# Patient Record
Sex: Male | Born: 1955 | Race: White | Hispanic: No | Marital: Single | Smoking: Former smoker
Health system: Southern US, Community
[De-identification: ages and names within clinical notes are randomized; demographics above are authoritative.]

## PROBLEM LIST (undated history)

## (undated) DIAGNOSIS — G959 Disease of spinal cord, unspecified: Secondary | ICD-10-CM

## (undated) DIAGNOSIS — G8220 Paraplegia, unspecified: Secondary | ICD-10-CM

## (undated) DIAGNOSIS — R269 Unspecified abnormalities of gait and mobility: Secondary | ICD-10-CM

## (undated) DIAGNOSIS — R471 Dysarthria and anarthria: Secondary | ICD-10-CM

## (undated) DIAGNOSIS — G249 Dystonia, unspecified: Secondary | ICD-10-CM

## (undated) HISTORY — DX: Dysarthria and anarthria: R47.1

## (undated) HISTORY — DX: Disease of spinal cord, unspecified: G95.9

## (undated) HISTORY — DX: Paraplegia, unspecified: G82.20

## (undated) HISTORY — DX: Unspecified abnormalities of gait and mobility: R26.9

## (undated) HISTORY — DX: Dystonia, unspecified: G24.9

---

## 2009-11-05 DIAGNOSIS — G249 Dystonia, unspecified: Secondary | ICD-10-CM | POA: Diagnosis present

## 2010-10-27 DIAGNOSIS — R471 Dysarthria and anarthria: Secondary | ICD-10-CM | POA: Insufficient documentation

## 2012-06-15 DIAGNOSIS — G243 Spasmodic torticollis: Secondary | ICD-10-CM | POA: Diagnosis not present

## 2012-07-27 DIAGNOSIS — R259 Unspecified abnormal involuntary movements: Secondary | ICD-10-CM | POA: Diagnosis not present

## 2012-07-27 DIAGNOSIS — M4712 Other spondylosis with myelopathy, cervical region: Secondary | ICD-10-CM | POA: Diagnosis not present

## 2012-07-27 DIAGNOSIS — G589 Mononeuropathy, unspecified: Secondary | ICD-10-CM | POA: Diagnosis not present

## 2012-09-01 DIAGNOSIS — M4712 Other spondylosis with myelopathy, cervical region: Secondary | ICD-10-CM | POA: Diagnosis not present

## 2012-09-01 DIAGNOSIS — R209 Unspecified disturbances of skin sensation: Secondary | ICD-10-CM | POA: Diagnosis not present

## 2012-09-08 DIAGNOSIS — M4802 Spinal stenosis, cervical region: Secondary | ICD-10-CM | POA: Diagnosis not present

## 2012-09-08 DIAGNOSIS — M4712 Other spondylosis with myelopathy, cervical region: Secondary | ICD-10-CM | POA: Diagnosis not present

## 2012-09-15 DIAGNOSIS — G243 Spasmodic torticollis: Secondary | ICD-10-CM | POA: Diagnosis not present

## 2012-09-29 DIAGNOSIS — Z01810 Encounter for preprocedural cardiovascular examination: Secondary | ICD-10-CM | POA: Diagnosis not present

## 2012-09-29 DIAGNOSIS — M4712 Other spondylosis with myelopathy, cervical region: Secondary | ICD-10-CM | POA: Diagnosis not present

## 2012-10-04 DIAGNOSIS — R259 Unspecified abnormal involuntary movements: Secondary | ICD-10-CM | POA: Diagnosis not present

## 2012-10-04 DIAGNOSIS — Z981 Arthrodesis status: Secondary | ICD-10-CM | POA: Diagnosis not present

## 2012-10-04 DIAGNOSIS — M5000 Cervical disc disorder with myelopathy, unspecified cervical region: Secondary | ICD-10-CM | POA: Diagnosis not present

## 2012-10-04 DIAGNOSIS — Z87891 Personal history of nicotine dependence: Secondary | ICD-10-CM | POA: Diagnosis not present

## 2012-10-04 DIAGNOSIS — Z79899 Other long term (current) drug therapy: Secondary | ICD-10-CM | POA: Diagnosis not present

## 2012-10-04 DIAGNOSIS — M4712 Other spondylosis with myelopathy, cervical region: Secondary | ICD-10-CM | POA: Diagnosis not present

## 2012-10-04 DIAGNOSIS — R498 Other voice and resonance disorders: Secondary | ICD-10-CM | POA: Diagnosis not present

## 2012-10-04 DIAGNOSIS — R1310 Dysphagia, unspecified: Secondary | ICD-10-CM | POA: Diagnosis not present

## 2012-10-11 DIAGNOSIS — R413 Other amnesia: Secondary | ICD-10-CM | POA: Diagnosis not present

## 2012-10-11 DIAGNOSIS — Z981 Arthrodesis status: Secondary | ICD-10-CM | POA: Diagnosis not present

## 2012-10-11 DIAGNOSIS — R0989 Other specified symptoms and signs involving the circulatory and respiratory systems: Secondary | ICD-10-CM | POA: Diagnosis not present

## 2012-10-11 DIAGNOSIS — Z5189 Encounter for other specified aftercare: Secondary | ICD-10-CM | POA: Diagnosis not present

## 2012-10-11 DIAGNOSIS — H9190 Unspecified hearing loss, unspecified ear: Secondary | ICD-10-CM | POA: Diagnosis not present

## 2012-10-11 DIAGNOSIS — Z4789 Encounter for other orthopedic aftercare: Secondary | ICD-10-CM | POA: Diagnosis not present

## 2012-10-11 DIAGNOSIS — R6889 Other general symptoms and signs: Secondary | ICD-10-CM | POA: Diagnosis not present

## 2012-10-11 DIAGNOSIS — M6281 Muscle weakness (generalized): Secondary | ICD-10-CM | POA: Diagnosis not present

## 2012-10-11 DIAGNOSIS — R633 Feeding difficulties: Secondary | ICD-10-CM | POA: Diagnosis not present

## 2012-10-11 DIAGNOSIS — R259 Unspecified abnormal involuntary movements: Secondary | ICD-10-CM | POA: Diagnosis not present

## 2012-10-11 DIAGNOSIS — M4712 Other spondylosis with myelopathy, cervical region: Secondary | ICD-10-CM | POA: Diagnosis not present

## 2012-10-11 DIAGNOSIS — N882 Stricture and stenosis of cervix uteri: Secondary | ICD-10-CM | POA: Diagnosis not present

## 2012-10-11 DIAGNOSIS — Z9889 Other specified postprocedural states: Secondary | ICD-10-CM | POA: Diagnosis not present

## 2012-10-11 DIAGNOSIS — G959 Disease of spinal cord, unspecified: Secondary | ICD-10-CM | POA: Diagnosis not present

## 2012-10-11 DIAGNOSIS — Z87891 Personal history of nicotine dependence: Secondary | ICD-10-CM | POA: Diagnosis not present

## 2012-10-11 DIAGNOSIS — R4587 Impulsiveness: Secondary | ICD-10-CM | POA: Diagnosis not present

## 2012-10-11 DIAGNOSIS — R279 Unspecified lack of coordination: Secondary | ICD-10-CM | POA: Diagnosis not present

## 2012-10-11 DIAGNOSIS — R4789 Other speech disturbances: Secondary | ICD-10-CM | POA: Diagnosis not present

## 2012-10-11 DIAGNOSIS — M62838 Other muscle spasm: Secondary | ICD-10-CM | POA: Diagnosis not present

## 2012-10-11 DIAGNOSIS — F411 Generalized anxiety disorder: Secondary | ICD-10-CM | POA: Diagnosis not present

## 2012-10-11 DIAGNOSIS — R269 Unspecified abnormalities of gait and mobility: Secondary | ICD-10-CM | POA: Diagnosis not present

## 2012-10-11 DIAGNOSIS — R5381 Other malaise: Secondary | ICD-10-CM | POA: Diagnosis not present

## 2012-10-11 DIAGNOSIS — R1312 Dysphagia, oropharyngeal phase: Secondary | ICD-10-CM | POA: Diagnosis not present

## 2012-10-11 DIAGNOSIS — R1310 Dysphagia, unspecified: Secondary | ICD-10-CM | POA: Diagnosis not present

## 2012-10-11 DIAGNOSIS — R1313 Dysphagia, pharyngeal phase: Secondary | ICD-10-CM | POA: Diagnosis not present

## 2012-10-11 DIAGNOSIS — R471 Dysarthria and anarthria: Secondary | ICD-10-CM | POA: Diagnosis not present

## 2012-10-26 DIAGNOSIS — M5030 Other cervical disc degeneration, unspecified cervical region: Secondary | ICD-10-CM | POA: Diagnosis not present

## 2012-10-26 DIAGNOSIS — R1312 Dysphagia, oropharyngeal phase: Secondary | ICD-10-CM | POA: Diagnosis not present

## 2012-10-26 DIAGNOSIS — R471 Dysarthria and anarthria: Secondary | ICD-10-CM | POA: Diagnosis not present

## 2012-10-26 DIAGNOSIS — G241 Genetic torsion dystonia: Secondary | ICD-10-CM | POA: Diagnosis not present

## 2012-10-26 DIAGNOSIS — N882 Stricture and stenosis of cervix uteri: Secondary | ICD-10-CM | POA: Diagnosis not present

## 2012-10-26 DIAGNOSIS — R4789 Other speech disturbances: Secondary | ICD-10-CM | POA: Diagnosis not present

## 2012-10-26 DIAGNOSIS — M6281 Muscle weakness (generalized): Secondary | ICD-10-CM | POA: Diagnosis not present

## 2012-10-26 DIAGNOSIS — Z7901 Long term (current) use of anticoagulants: Secondary | ICD-10-CM | POA: Diagnosis not present

## 2012-10-26 DIAGNOSIS — Z4789 Encounter for other orthopedic aftercare: Secondary | ICD-10-CM | POA: Diagnosis not present

## 2012-10-26 DIAGNOSIS — M4712 Other spondylosis with myelopathy, cervical region: Secondary | ICD-10-CM | POA: Diagnosis not present

## 2012-10-26 DIAGNOSIS — Z981 Arthrodesis status: Secondary | ICD-10-CM | POA: Diagnosis not present

## 2012-10-26 DIAGNOSIS — R269 Unspecified abnormalities of gait and mobility: Secondary | ICD-10-CM | POA: Diagnosis not present

## 2012-10-26 DIAGNOSIS — Z5189 Encounter for other specified aftercare: Secondary | ICD-10-CM | POA: Diagnosis not present

## 2012-10-26 DIAGNOSIS — R1313 Dysphagia, pharyngeal phase: Secondary | ICD-10-CM | POA: Diagnosis not present

## 2012-11-02 DIAGNOSIS — M5030 Other cervical disc degeneration, unspecified cervical region: Secondary | ICD-10-CM | POA: Diagnosis not present

## 2012-11-02 DIAGNOSIS — G241 Genetic torsion dystonia: Secondary | ICD-10-CM | POA: Diagnosis not present

## 2012-11-04 DIAGNOSIS — M5030 Other cervical disc degeneration, unspecified cervical region: Secondary | ICD-10-CM | POA: Diagnosis not present

## 2012-11-04 DIAGNOSIS — G241 Genetic torsion dystonia: Secondary | ICD-10-CM | POA: Diagnosis not present

## 2012-11-05 DIAGNOSIS — Z4789 Encounter for other orthopedic aftercare: Secondary | ICD-10-CM | POA: Diagnosis not present

## 2012-11-05 DIAGNOSIS — Z981 Arthrodesis status: Secondary | ICD-10-CM | POA: Diagnosis not present

## 2012-11-19 DIAGNOSIS — R209 Unspecified disturbances of skin sensation: Secondary | ICD-10-CM | POA: Diagnosis not present

## 2012-11-19 DIAGNOSIS — M4712 Other spondylosis with myelopathy, cervical region: Secondary | ICD-10-CM | POA: Diagnosis not present

## 2012-11-19 DIAGNOSIS — R4789 Other speech disturbances: Secondary | ICD-10-CM | POA: Diagnosis not present

## 2012-11-19 DIAGNOSIS — M6281 Muscle weakness (generalized): Secondary | ICD-10-CM | POA: Diagnosis not present

## 2012-11-19 DIAGNOSIS — R471 Dysarthria and anarthria: Secondary | ICD-10-CM | POA: Diagnosis not present

## 2012-11-19 DIAGNOSIS — R269 Unspecified abnormalities of gait and mobility: Secondary | ICD-10-CM | POA: Diagnosis not present

## 2012-11-19 DIAGNOSIS — R1313 Dysphagia, pharyngeal phase: Secondary | ICD-10-CM | POA: Diagnosis not present

## 2012-11-19 DIAGNOSIS — Z5189 Encounter for other specified aftercare: Secondary | ICD-10-CM | POA: Diagnosis not present

## 2012-11-29 DIAGNOSIS — R471 Dysarthria and anarthria: Secondary | ICD-10-CM | POA: Diagnosis not present

## 2012-11-29 DIAGNOSIS — M4712 Other spondylosis with myelopathy, cervical region: Secondary | ICD-10-CM | POA: Diagnosis not present

## 2012-11-29 DIAGNOSIS — R4789 Other speech disturbances: Secondary | ICD-10-CM | POA: Diagnosis not present

## 2012-11-29 DIAGNOSIS — R269 Unspecified abnormalities of gait and mobility: Secondary | ICD-10-CM | POA: Diagnosis not present

## 2012-11-29 DIAGNOSIS — Z5189 Encounter for other specified aftercare: Secondary | ICD-10-CM | POA: Diagnosis not present

## 2012-11-29 DIAGNOSIS — R209 Unspecified disturbances of skin sensation: Secondary | ICD-10-CM | POA: Diagnosis not present

## 2012-11-29 DIAGNOSIS — R1313 Dysphagia, pharyngeal phase: Secondary | ICD-10-CM | POA: Diagnosis not present

## 2012-11-29 DIAGNOSIS — M6281 Muscle weakness (generalized): Secondary | ICD-10-CM | POA: Diagnosis not present

## 2012-12-01 DIAGNOSIS — M4712 Other spondylosis with myelopathy, cervical region: Secondary | ICD-10-CM | POA: Diagnosis not present

## 2012-12-01 DIAGNOSIS — Z981 Arthrodesis status: Secondary | ICD-10-CM | POA: Diagnosis not present

## 2012-12-22 DIAGNOSIS — G8220 Paraplegia, unspecified: Secondary | ICD-10-CM | POA: Diagnosis not present

## 2012-12-22 DIAGNOSIS — G243 Spasmodic torticollis: Secondary | ICD-10-CM | POA: Diagnosis not present

## 2012-12-28 DIAGNOSIS — IMO0001 Reserved for inherently not codable concepts without codable children: Secondary | ICD-10-CM | POA: Diagnosis not present

## 2012-12-28 DIAGNOSIS — R4789 Other speech disturbances: Secondary | ICD-10-CM | POA: Diagnosis not present

## 2012-12-28 DIAGNOSIS — R1313 Dysphagia, pharyngeal phase: Secondary | ICD-10-CM | POA: Diagnosis not present

## 2013-01-05 DIAGNOSIS — Z981 Arthrodesis status: Secondary | ICD-10-CM | POA: Diagnosis not present

## 2013-01-05 DIAGNOSIS — M4712 Other spondylosis with myelopathy, cervical region: Secondary | ICD-10-CM | POA: Diagnosis not present

## 2013-01-06 DIAGNOSIS — R4789 Other speech disturbances: Secondary | ICD-10-CM | POA: Diagnosis not present

## 2013-01-06 DIAGNOSIS — IMO0001 Reserved for inherently not codable concepts without codable children: Secondary | ICD-10-CM | POA: Diagnosis not present

## 2013-01-06 DIAGNOSIS — R1313 Dysphagia, pharyngeal phase: Secondary | ICD-10-CM | POA: Diagnosis not present

## 2013-01-07 DIAGNOSIS — Z1211 Encounter for screening for malignant neoplasm of colon: Secondary | ICD-10-CM | POA: Diagnosis not present

## 2013-01-07 DIAGNOSIS — Z125 Encounter for screening for malignant neoplasm of prostate: Secondary | ICD-10-CM | POA: Diagnosis not present

## 2013-01-07 DIAGNOSIS — Z0000 Encounter for general adult medical examination without abnormal findings: Secondary | ICD-10-CM | POA: Diagnosis not present

## 2013-01-12 DIAGNOSIS — E7800 Pure hypercholesterolemia, unspecified: Secondary | ICD-10-CM | POA: Diagnosis not present

## 2013-01-13 DIAGNOSIS — R1313 Dysphagia, pharyngeal phase: Secondary | ICD-10-CM | POA: Diagnosis not present

## 2013-01-13 DIAGNOSIS — R4789 Other speech disturbances: Secondary | ICD-10-CM | POA: Diagnosis not present

## 2013-01-13 DIAGNOSIS — IMO0001 Reserved for inherently not codable concepts without codable children: Secondary | ICD-10-CM | POA: Diagnosis not present

## 2013-01-18 DIAGNOSIS — R4789 Other speech disturbances: Secondary | ICD-10-CM | POA: Diagnosis not present

## 2013-01-18 DIAGNOSIS — IMO0001 Reserved for inherently not codable concepts without codable children: Secondary | ICD-10-CM | POA: Diagnosis not present

## 2013-01-18 DIAGNOSIS — R1313 Dysphagia, pharyngeal phase: Secondary | ICD-10-CM | POA: Diagnosis not present

## 2013-03-22 ENCOUNTER — Encounter: Payer: Self-pay | Admitting: Neurology

## 2013-03-22 ENCOUNTER — Ambulatory Visit (INDEPENDENT_AMBULATORY_CARE_PROVIDER_SITE_OTHER): Payer: Medicare Other | Admitting: Neurology

## 2013-03-22 VITALS — Wt 147.0 lb

## 2013-03-22 DIAGNOSIS — G243 Spasmodic torticollis: Secondary | ICD-10-CM

## 2013-03-22 DIAGNOSIS — G249 Dystonia, unspecified: Secondary | ICD-10-CM

## 2013-03-22 DIAGNOSIS — G8220 Paraplegia, unspecified: Secondary | ICD-10-CM

## 2013-03-22 DIAGNOSIS — R471 Dysarthria and anarthria: Secondary | ICD-10-CM

## 2013-03-22 DIAGNOSIS — G959 Disease of spinal cord, unspecified: Secondary | ICD-10-CM

## 2013-03-22 DIAGNOSIS — R269 Unspecified abnormalities of gait and mobility: Secondary | ICD-10-CM | POA: Insufficient documentation

## 2013-03-22 MED ORDER — TRAMADOL HCL 50 MG PO TBDP: 50.0000 mg | ORAL_TABLET | Freq: Three times a day (TID) | ORAL | Status: DC | PRN

## 2013-03-22 MED ORDER — ONABOTULINUMTOXINA 100 UNITS IJ SOLR: 300.0000 [IU] | Freq: Once | INTRAMUSCULAR | Status: DC

## 2013-03-22 NOTE — Progress Notes (Signed)
History of Present Illness   Patrick Daniel is a 57 year old right-handed Caucasian male.  He has past medical history of hyperlipidemia, depression, presenting with 3 years history of gradual onset language difficulty, gait difficulty.  He was evaluated by [name] neurologist, reported normal MRI of brain, he was diagnosed with cervical dystonia, was given Botox injection of the cervical muscles, which has improved his symptoms temporarily.  He moved back to the [place] in April 2012, began to seek medical attention, initially evaluated by Dr. [name], reported again normal MRI of brain, also had trial of Haldol and baclofen without much improvement.   Sinemet did not help him. Artane and baclofen provide no help either. 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  He was evaluated by local neurosurgeon Dr. [name] at [facility], Because concomitant central nervous system degenerative disorder, cervical dystonia, he is hesitate to perform decompression surgery. I later referred him to [facility] neurology, evaluated by Dr. [name], who has suggested Botox injection to alleviate his cervical dystonia symptoms first, may consider cervical decompression surgery later. Also, likely there is a component of central nervous system degenerative disorder, such as spinocerebellar ataxia,.   He has profound dysarthria, also dysphagia, gait difficulty, frequent falling, he denies sensory changes.  Normal or negative CMP, PEP, CBC, HIV, Lyme disease, SSA/B, TSH, ACE, RPR, CRP, CPK.  He received first EMG guided Botox injection since March 2012, he reported  great improvement, he has been receiving BOTOX injection every 3 months, there is significant improvement of his abnormal neck  postural, but still with severe retrocollis, left turn, right tilt.  he began to have moderate improvement after repeat injection.  he was evaluated by his neurosurgeon Dr. [name] at [facility], repeat MRI cervical showed advanced multilevel degenerative changes of the cervical spine, most pronounced at C5-C6, where there is severe central stenosis, impression on the cord with focal myelomalacia and severe bilateral neuroforaminal narrowing. Overall appearance is similar to prior study from 09/18/2010. He had posterior decompression  and fusion surgery in December 2013, recovering well, but complains of neck muscle spasm, and chest muscle spasm continued spastic gait difficulty, occasional urinary incontinence, his posture is much improved, but has limited range of motion of his neck  UPDATE May 27th 2014: Last injection was in Feb 26th 2014. He responded very well, he has moved into independent living since April 2014, he walks couple miles every other day, using his cane, his gait difficulty has stabilized, his talking, and the swallowing, abnormal neck posture has stabilized as well, he complains of neck pain, left shoulder achy pain,.     Physical Exam  Neck: s/p posterior cervical fusion  Neurologic Exam  Mental Status: Patient had much improved, cervical dystonia, with severe limited range of motion of his neck, mild retrocollis,  mild to moderate rotatation to left side, mild right shoulder elevation.  slow spastic speech. Cranial Nerves: CN II-XII pupils were equal round reactive to light.  Extraocular movements were full.   facial symmetric,uvula tongue midline, there was no tongue atrophy and fasciculations, he has slow spastic tongue movement,    Motor:  no significant weakness, mild to moderate spasticity of bilateral LEs Sensory: preserved bilateral ankle vibratory sensation,  mildly length-dependent decreased pinprick to above ankle level Gait and Station:  wide-based, stiff, unsteady  gait Reflexes:  hyperreflexia, UEs 2+, patellar 3+/3+, plantar responses were extensor bilaterally   Assessment and Plan: 57 year-old gentleman with gradual onset, but progressive worsening spastic dysarthria, profound cervical dystonia, spastic paraplegia, gait difficulty,MRI showed advanced multilevel degenerative changes of the cervical spine, most pronounced at C5 and 6, there was severe central canal stenosis, iimpression on cervical cord,, with focal myelomalacia, severe bilateral foraminal narrowing. he had a posterior decompression, and fusion  by neurosurgeon at [facility], return for repeat Botox injection  400 units of BOTOX A were injected, (Lot No. [id number], Exp Oct 2016), 100 units were dissolved into 2 cc of NS.  Right  sternocleidomastoid  50 units   left splenius capitis 25x2=50 Left splenis cervix 25x 3=75 Left  left iliocostalis 50 units Left upper trapezius 50 Left longissimus 25  Patient tolerated the injection very well, will return to clinic in 3 months for repeat injection refill tramadol 50 mg, 60 tablets, 3 refills.

## 2013-04-08 DIAGNOSIS — Z87891 Personal history of nicotine dependence: Secondary | ICD-10-CM | POA: Diagnosis not present

## 2013-04-08 DIAGNOSIS — Z09 Encounter for follow-up examination after completed treatment for conditions other than malignant neoplasm: Secondary | ICD-10-CM | POA: Diagnosis not present

## 2013-04-08 DIAGNOSIS — Z4789 Encounter for other orthopedic aftercare: Secondary | ICD-10-CM | POA: Diagnosis not present

## 2013-04-08 DIAGNOSIS — Z981 Arthrodesis status: Secondary | ICD-10-CM | POA: Diagnosis not present

## 2013-05-09 DIAGNOSIS — Z131 Encounter for screening for diabetes mellitus: Secondary | ICD-10-CM | POA: Diagnosis not present

## 2013-05-09 DIAGNOSIS — M542 Cervicalgia: Secondary | ICD-10-CM | POA: Diagnosis not present

## 2013-05-09 DIAGNOSIS — E782 Mixed hyperlipidemia: Secondary | ICD-10-CM | POA: Diagnosis not present

## 2013-06-29 ENCOUNTER — Encounter: Payer: Self-pay | Admitting: Neurology

## 2013-06-29 ENCOUNTER — Ambulatory Visit (INDEPENDENT_AMBULATORY_CARE_PROVIDER_SITE_OTHER): Payer: Medicare Other | Admitting: Neurology

## 2013-06-29 VITALS — BP 123/65 | HR 78 | Ht 65.0 in | Wt 156.5 lb

## 2013-06-29 DIAGNOSIS — G249 Dystonia, unspecified: Secondary | ICD-10-CM

## 2013-06-29 DIAGNOSIS — G243 Spasmodic torticollis: Secondary | ICD-10-CM

## 2013-06-29 DIAGNOSIS — R269 Unspecified abnormalities of gait and mobility: Secondary | ICD-10-CM

## 2013-06-29 DIAGNOSIS — G8220 Paraplegia, unspecified: Secondary | ICD-10-CM

## 2013-06-29 DIAGNOSIS — G959 Disease of spinal cord, unspecified: Secondary | ICD-10-CM

## 2013-06-29 DIAGNOSIS — R471 Dysarthria and anarthria: Secondary | ICD-10-CM

## 2013-06-29 MED ORDER — ONABOTULINUMTOXINA 100 UNITS IJ SOLR: 400.0000 [IU] | Freq: Once | INTRAMUSCULAR | Status: DC

## 2013-06-29 MED ORDER — ONABOTULINUMTOXINA 100 UNITS IJ SOLR: 100.0000 [IU] | Freq: Once | INTRAMUSCULAR | Status: DC

## 2013-06-29 MED ORDER — ONABOTULINUMTOXINA 100 UNITS IJ SOLR
100.0000 [IU] | Freq: Once | INTRAMUSCULAR | Status: AC
  Administered 2013-06-29: 100 [IU] via INTRAMUSCULAR

## 2013-06-29 MED ORDER — ONABOTULINUMTOXINA 100 UNITS IJ SOLR
300.0000 [IU] | Freq: Once | INTRAMUSCULAR | Status: AC
  Administered 2013-06-29: 300 [IU] via INTRAMUSCULAR

## 2013-06-29 NOTE — Addendum Note (Signed)
Addended by: Levert Feinstein on: 06/29/2013 03:11 PM   Modules accepted: Orders

## 2013-06-29 NOTE — Progress Notes (Signed)
History of Present Illness   Quanta is a 57 year old right-handed Caucasian male.  He has past medical history of hyperlipidemia, depression, presenting with 3 years history of gradual onset language difficulty, gait difficulty.  He was evaluated by [facility] neurologist, reported normal MRI of brain, he was diagnosed with cervical dystonia, was given Botox injection of the cervical muscles, which has improved his symptoms temporarily.  He moved back to the [place] in April 2012, began to seek medical attention, initially evaluated by Dr. [name], reported again normal MRI of brain, also had trial of Haldol and baclofen without much improvement.   Sinemet did not help him. Artane and baclofen provide no help either. 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  He was evaluated by local neurosurgeon Dr. [name] at [facility], Because concomitant central nervous system degenerative disorder, cervical dystonia, he is hesitate to perform decompression surgery. I later referred him to [facility] neurology, evaluated by Dr. [name], who has suggested Botox injection to alleviate his cervical dystonia symptoms first, may consider cervical decompression surgery later. Also, likely there is a component of central nervous system degenerative disorder, such as spinocerebellar ataxia,.   He has profound dysarthria, also dysphagia, gait difficulty, frequent falling, he denies sensory changes.  Normal or negative CMP, PEP, CBC, HIV, Lyme disease, SSA/B, TSH, ACE, RPR, CRP, CPK.  He received first EMG guided Botox injection since March 2012, he reported  great improvement, he has been receiving BOTOX injection every 3 months, there is significant improvement of his abnormal neck  postural, but still with severe retrocollis, left turn, right tilt.  he began to have moderate improvement after repeat injection.  he was evaluated by his neurosurgeon Dr. [name] at [facility], repeat MRI cervical showed advanced multilevel degenerative changes of the cervical spine, most pronounced at C5-C6, where there is severe central stenosis, impression on the cord with focal myelomalacia and severe bilateral neuroforaminal narrowing. Overall appearance is similar to prior study from 09/18/2010. He had posterior decompression  and fusion surgery in December 2013, recovering well, but complains of neck muscle spasm, and chest muscle spasm continued spastic gait difficulty, occasional urinary incontinence, his posture is much improved, but has limited range of motion of his neck  UPDATE Sep 3rd 2014: Last injection was in May 27th 2014. He responded very well, he has moved into independent living since April 2014, he walks couple miles every other day, using his cane, his gait difficulty has stabilized, his talking, and the swallowing, abnormal neck posture has stabilized as well, he complains of dizziness when leaning backwards.    Physical Exam  Neck: s/p posterior cervical fusion  Neurologic Exam  Mental Status: Patient had much improved, cervical dystonia, with severe limited range of motion of his neck, mild retrocollis,  mild to moderate rotatation to left side, mild right shoulder elevation.  slow spastic speech. Cranial Nerves: CN II-XII pupils were equal round reactive to light.  Extraocular movements were full.   facial symmetric,uvula tongue midline, there was no tongue atrophy and fasciculations, he has slow spastic tongue movement,    Motor:  no significant weakness, mild to moderate spasticity of bilateral lower extremities Sensory: preserved bilateral ankle vibratory sensation,  mildly length-dependent decreased pinprick to above ankle level Gait and Station:  wide-based, stiff,  unsteady gait Reflexes:  hyperreflexia, UEs 2+, patellar 3+/3+, plantar responses were extensor bilaterally   Assessment and Plan: 57 year-old gentleman with gradual onset, but progressive worsening spastic dysarthria, profound cervical dystonia, spastic paraplegia, gait difficulty,MRI showed advanced multilevel degenerative changes of the cervical spine, most pronounced at C5 and 6, there was severe central canal stenosis,  impression on cervical cord,, with focal myelomalacia, severe bilateral foraminal narrowing. he had a posterior decompression, and fusion  by neurosurgeon Dr. [name] in Dec 2013 at [facility], return for repeat Botox injection  400 units of BOTOX A were injected, (Lot No. [id number], Exp Feb 2017), 100 units were dissolved into 2 cc of NS.  Right  sternocleidomastoid  50 units   left splenius capitis 50x2=100 Left splenis cervix 25x 5=125 Left  left iliocostalis 50 units Left longissimus 25x2=50 units Left levator scapula 25 units Left semisplenius 25 units  Right splenius capitis 25 units  Patient tolerated the injection very well, will return to clinic in 3 months for repeat injection, will decrease his botox dose to 300 units on next injection

## 2013-09-01 ENCOUNTER — Other Ambulatory Visit: Payer: Self-pay

## 2013-10-04 HISTORY — PX: POSTERIOR CERVICAL FUSION/FORAMINOTOMY: SHX5038

## 2013-10-04 HISTORY — PX: ANTERIOR CERVICAL DECOMP/DISCECTOMY FUSION: SHX1161

## 2013-10-05 ENCOUNTER — Ambulatory Visit (INDEPENDENT_AMBULATORY_CARE_PROVIDER_SITE_OTHER): Payer: Medicare Other | Admitting: Neurology

## 2013-10-05 ENCOUNTER — Encounter: Payer: Self-pay | Admitting: Neurology

## 2013-10-05 VITALS — BP 128/80 | HR 85 | Ht 66.0 in | Wt 168.0 lb

## 2013-10-05 DIAGNOSIS — G8220 Paraplegia, unspecified: Secondary | ICD-10-CM

## 2013-10-05 DIAGNOSIS — G243 Spasmodic torticollis: Secondary | ICD-10-CM

## 2013-10-05 DIAGNOSIS — G249 Dystonia, unspecified: Secondary | ICD-10-CM

## 2013-10-05 MED ORDER — ONABOTULINUMTOXINA 100 UNITS IJ SOLR
400.0000 [IU] | Freq: Once | INTRAMUSCULAR | Status: AC
  Administered 2013-10-05: 400 [IU] via INTRAMUSCULAR

## 2013-10-05 NOTE — Progress Notes (Signed)
History of Present Illness   Patrick Daniel is a 57 year old right-handed Caucasian male.  He has past medical history of hyperlipidemia, depression, presenting with 3 years history of gradual onset language difficulty, gait difficulty.  He was evaluated by [facility] neurologist, reported normal MRI of brain, he was diagnosed with cervical dystonia, was given Botox injection of the cervical muscles, which has improved his symptoms temporarily.  He moved back to the [name] in April 2012, began to seek medical attention, initially evaluated by [name], reported again normal MRI of brain, also had trial of Haldol and baclofen without much improvement.   Sinemet did not help him. Artane and baclofen provide no help either. 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  He was evaluated by local neurosurgeon [name] at [facility], Because concomitant central nervous system degenerative disorder, cervical dystonia, he is hesitate to perform decompression surgery. I later referred him to [facility] neurology, evaluated by [name], who has suggested Botox injection to alleviate his cervical dystonia symptoms first, may consider cervical decompression surgery later. Also, likely there is a component of central nervous system degenerative disorder, such as spinocerebellar ataxia,.   He has profound dysarthria, also dysphagia, gait difficulty, frequent falling, he denies sensory changes.  Normal or negative CMP, PEP, CBC, HIV, Lyme disease, SSA/B, TSH, ACE, RPR, CRP, CPK.  He received first EMG guided Botox injection since March 2012, he reported  great improvement, he has been receiving BOTOX injection every 3 months, there is significant improvement of his abnormal neck  postural, but still with severe retrocollis, left turn, right tilt.  he began to have moderate improvement after repeat injection.  he was evaluated by his neurosurgeon [name] at [facility], repeat MRI cervical showed advanced multilevel degenerative changes of the cervical spine, most pronounced at C5-C6, where there is severe central stenosis, impression on the cord with focal myelomalacia and severe bilateral neuroforaminal narrowing. Overall appearance is similar to prior study from 09/18/2010. He had posterior decompression  and fusion surgery in December 2013, recovering well, but complains of neck muscle spasm, and chest muscle spasm continued spastic gait difficulty, occasional urinary incontinence, his posture is much improved, but has limited range of motion of his neck  UPDATE 10/05/2013 Last injection was in  Sep 2014. He responded very well, he has moved into independent living since April 2014, he walks couple miles every other day, using his cane, his gait difficulty has stabilized, his talking, and the swallowing, abnormal neck posture has stabilized as well,      Physical Exam  Neck: s/p posterior cervical fusion  Neurologic Exam  Mental Status: Patient had much improved, cervical dystonia, with severe limited range of motion of his neck, slight retrocollis,  mild to moderate rotatation to left side, slight right shoulder elevation.  slow spastic speech. Cranial Nerves: CN II-XII pupils were equal round reactive to light.  Extraocular movements were full.   facial symmetric,uvula tongue midline, there was no tongue atrophy and fasciculations, he has slow spastic tongue movement,    Motor:  no significant weakness, mild to moderate spasticity of bilateral lower extremities Sensory: preserved bilateral ankle vibratory sensation,  mildly length-dependent decreased pinprick to above ankle level Gait and Station:  wide-based, stiff, unsteady gait Reflexes:  hyperreflexia, UEs 2+,  patellar 3+/3+, plantar  responses were extensor bilaterally   Assessment and Plan:   57 year-old gentleman with gradual onset, but progressive worsening spastic dysarthria, profound cervical dystonia, spastic paraplegia, gait difficulty,MRI showed advanced multilevel degenerative changes of the cervical spine, most pronounced at C5 and 6, there was severe central canal stenosis, compressing on cervical cord,, with focal myelomalacia, severe bilateral foraminal narrowing. he had a posterior decompression, and fusion  by neurosurgeon Dr. [name] in Dec 2013 at [facility], return for repeat Botox injection. His severe cervical dystonia has much improved with treatment.  400 units of BOTOX A were injected, (Lot No. [id number], Feb 2017, [id number], Exp April 2017), 100 units were dissolved into 2 cc of NS.  Right  sternocleidomastoid 75 units   left splenius capitis 25x2=50 Left splenis cervix 25 x2=50  Left  left iliocostalis 50 units Left longissimus 25x2=50 units  Left levator scapula 25 units Left semisplenius 25 units Left oblique capitus inferior 25 units  Right levator scapula 25 units Right oblique capitus inferior 25 units  Patient tolerated the injection very well, will return to clinic in 3 months for repeat injection, will decrease his botox dose to 300 units on next injection

## 2013-10-12 DIAGNOSIS — Z981 Arthrodesis status: Secondary | ICD-10-CM | POA: Diagnosis not present

## 2013-10-12 DIAGNOSIS — Z4789 Encounter for other orthopedic aftercare: Secondary | ICD-10-CM | POA: Diagnosis not present

## 2013-10-12 DIAGNOSIS — R259 Unspecified abnormal involuntary movements: Secondary | ICD-10-CM | POA: Diagnosis not present

## 2013-10-12 DIAGNOSIS — Z9889 Other specified postprocedural states: Secondary | ICD-10-CM | POA: Diagnosis not present

## 2013-10-12 DIAGNOSIS — R209 Unspecified disturbances of skin sensation: Secondary | ICD-10-CM | POA: Diagnosis not present

## 2014-01-04 ENCOUNTER — Encounter: Payer: Self-pay | Admitting: Neurology

## 2014-01-04 ENCOUNTER — Ambulatory Visit (INDEPENDENT_AMBULATORY_CARE_PROVIDER_SITE_OTHER): Payer: Medicare Other | Admitting: Neurology

## 2014-01-04 VITALS — Ht 66.0 in | Wt 164.0 lb

## 2014-01-04 DIAGNOSIS — R269 Unspecified abnormalities of gait and mobility: Secondary | ICD-10-CM

## 2014-01-04 DIAGNOSIS — G249 Dystonia, unspecified: Secondary | ICD-10-CM

## 2014-01-04 DIAGNOSIS — G243 Spasmodic torticollis: Secondary | ICD-10-CM

## 2014-01-04 DIAGNOSIS — G959 Disease of spinal cord, unspecified: Secondary | ICD-10-CM

## 2014-01-04 DIAGNOSIS — G8220 Paraplegia, unspecified: Secondary | ICD-10-CM

## 2014-01-04 DIAGNOSIS — R471 Dysarthria and anarthria: Secondary | ICD-10-CM

## 2014-01-04 MED ORDER — ONABOTULINUMTOXINA 100 UNITS IJ SOLR
270.0000 [IU] | Freq: Once | INTRAMUSCULAR | Status: AC
  Administered 2014-01-04: 270 [IU] via INTRAMUSCULAR

## 2014-01-04 NOTE — Progress Notes (Signed)
History of Present Illness   Patrick Daniel is a 58 year old right-handed Caucasian male.  He has past medical history of hyperlipidemia, depression, presenting with 3 years history of gradual onset language difficulty, gait difficulty.  He was evaluated by [facility] neurologist, reported normal MRI of brain, he was diagnosed with cervical dystonia, was given Botox injection of the cervical muscles, which has improved his symptoms temporarily.  He moved back to the [name] in April 2012, began to seek medical attention, initially evaluated by [name], reported again normal MRI of brain, also had trial of Haldol and baclofen without much improvement.   Sinemet did not help him. Artane and baclofen provide no help either. 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  He was evaluated by local neurosurgeon [name] at [facility], Because concomitant central nervous system degenerative disorder, cervical dystonia, he is hesitate to perform decompression surgery. I later referred him to [facility] neurology, evaluated by [name], who has suggested Botox injection to alleviate his cervical dystonia symptoms first, may consider cervical decompression surgery later. Also, likely there is a component of central nervous system degenerative disorder, such as spinocerebellar ataxia,.   He has profound dysarthria, also dysphagia, gait difficulty, frequent falling, he denies sensory changes.  Normal or negative CMP, PEP, CBC, HIV, Lyme disease, SSA/B, TSH, ACE, RPR, CRP, CPK.  He received first EMG guided Botox injection since March 2012, he reported  great improvement, he has been receiving BOTOX injection every 3 months, there is significant improvement of his abnormal neck  postural, but still with severe retrocollis, left turn, right tilt.  he began to have moderate improvement after repeat injection.  he was evaluated by his neurosurgeon [name] at [facility], repeat MRI cervical showed advanced multilevel degenerative changes of the cervical spine, most pronounced at C5-C6, where there is severe central stenosis, impression on the cord with focal myelomalacia and severe bilateral neuroforaminal narrowing. Overall appearance is similar to prior study from 09/18/2010. He had posterior decompression  and fusion surgery in December 2013, recovering well, but complains of neck muscle spasm, and chest muscle spasm continued spastic gait difficulty, occasional urinary incontinence, his posture is much improved, but has limited range of motion of his neck  UPDATE 10/05/2013 Last injection was in  Sep 2014. He responded very well, he has moved into independent living since April 2014, he walks couple miles every other day, using his cane, his gait difficulty has stabilized, his talking, and the swallowing, abnormal neck posture has stabilized as well,      Physical Exam  Neck: s/p posterior cervical fusion  Neurologic Exam  Mental Status: Patient had much improved, cervical dystonia, with severe limited range of motion of his neck, slight retrocollis,  mild to moderate rotatation to left side, slight right shoulder elevation.  slow spastic speech. Cranial Nerves: CN II-XII pupils were equal round reactive to light.  Extraocular movements were full.   facial symmetric,uvula tongue midline, there was no tongue atrophy and fasciculations, he has slow spastic tongue movement,    Motor:  no significant weakness, mild to moderate spasticity of bilateral lower extremities Sensory: preserved bilateral ankle vibratory sensation,  mildly length-dependent decreased pinprick to above ankle level Gait and Station:  wide-based, stiff, unsteady gait Reflexes:  hyperreflexia, UEs 2+,  patellar 3+/3+, plantar  responses were extensor bilaterally   Assessment and Plan:   58 year-old gentleman with gradual onset, but progressive worsening spastic dysarthria, profound cervical dystonia, spastic paraplegia, gait difficulty,MRI showed advanced multilevel degenerative changes of the cervical spine, most pronounced at C5 and 6, there was severe central canal stenosis, compressing on cervical cord,, with focal myelomalacia, severe bilateral foraminal narrowing. he had a posterior decompression, and fusion  by neurosurgeon [name] in Dec 2013 at [facility], return for repeat Botox injection. His severe cervical dystonia has much improved with treatment.  270 units of BOTOX A were injected, (Lot No. [id number] March 2017, [id number], Exp April 2017), 100 units/2 cc of NS.  Right  sternocleidomastoid 100 units   left splenius capitis 25  Left splenis cervix 25 +20=45  Left longissimus 25 units   Left semisplenius 25 units Left oblique capitus inferior 12.5 units  Right levator scapula 25 units Right oblique capitus inferior 12.5 units  Patient tolerated the injection very well, will return to clinic in 3 months for repeat injection, will inject botox 200 units on next visit.

## 2014-04-05 ENCOUNTER — Encounter: Payer: Self-pay | Admitting: Neurology

## 2014-04-05 ENCOUNTER — Ambulatory Visit (INDEPENDENT_AMBULATORY_CARE_PROVIDER_SITE_OTHER): Payer: Medicare Other | Admitting: Neurology

## 2014-04-05 VITALS — BP 131/71 | HR 86 | Ht 66.0 in | Wt 166.0 lb

## 2014-04-05 DIAGNOSIS — G243 Spasmodic torticollis: Secondary | ICD-10-CM | POA: Diagnosis not present

## 2014-04-05 DIAGNOSIS — G249 Dystonia, unspecified: Secondary | ICD-10-CM

## 2014-04-05 DIAGNOSIS — G8220 Paraplegia, unspecified: Secondary | ICD-10-CM

## 2014-04-05 DIAGNOSIS — G959 Disease of spinal cord, unspecified: Secondary | ICD-10-CM

## 2014-04-05 MED ORDER — TRAMADOL HCL 50 MG PO TABS: 50.0000 mg | ORAL_TABLET | Freq: Three times a day (TID) | ORAL | Status: DC

## 2014-04-05 NOTE — Progress Notes (Signed)
History of Present Illness   [facility] is a 58 year old right-handed Caucasian male.  He has past medical history of hyperlipidemia, depression, presenting with 3 years history of gradual onset language difficulty, gait difficulty.  He was evaluated by [facility] neurologist, reported normal MRI of brain, he was diagnosed with cervical dystonia, was given Botox injection of the cervical muscles, which has improved his symptoms temporarily.  He moved back to the [name] in April 2012, began to seek medical attention, initially evaluated by [name], reported again normal MRI of brain, also had trial of Haldol and baclofen without much improvement.   Sinemet did not help him. Artane and baclofen provide no help either. 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  He was evaluated by local neurosurgeon [name] at [facility], Because concomitant central nervous system degenerative disorder, cervical dystonia, he is hesitate to perform decompression surgery. I later referred him to [facility] neurology, evaluated by [name], who has suggested Botox injection to alleviate his cervical dystonia symptoms first, may consider cervical decompression surgery later. Also, likely there is a component of central nervous system degenerative disorder, such as spinocerebellar ataxia,.   He has profound dysarthria, also dysphagia, gait difficulty, frequent falling, he denies sensory changes.  Normal or negative CMP, PEP, CBC, HIV, Lyme disease, SSA/B, TSH, ACE, RPR, CRP, CPK.  He received first EMG guided Botox injection since March 2012, he reported  great improvement, he has been receiving BOTOX injection every 3 months, there is significant improvement of his abnormal neck  postural, but still with severe retrocollis, left turn, right tilt.  he began to have moderate improvement after repeat injection.  he was evaluated by his neurosurgeon [name] at [facility], repeat MRI cervical showed advanced multilevel degenerative changes of the cervical spine, most pronounced at C5-C6, where there is severe central stenosis, impression on the cord with focal myelomalacia and severe bilateral neuroforaminal narrowing. Overall appearance is similar to prior study from 09/18/2010. He had posterior decompression  and fusion surgery in December 2013, recovering well, but complains of neck muscle spasm, and chest muscle spasm continued spastic gait difficulty, occasional urinary incontinence, his posture is much improved, but has limited range of motion of his neck  UPDATE June 10th 2015; Last injection was in  March 2015. He responded very well, he has moved into independent living since April 2014,  He continued to have neck muscle pain, especially at left posterior neck,  Continue with gait difficulty      Physical Exam  Neck: s/p posterior cervical fusion  Neurologic Exam  Mental Status: Patient had much improved, cervical dystonia, with severe limited range of motion of his neck, slight retrocollis,  mild to moderate rotatation to left side, slight right shoulder elevation.  slow spastic speech. Cranial Nerves: CN II-XII pupils were equal round reactive to light.  Extraocular movements were full.   facial symmetric,uvula tongue midline, there was no tongue atrophy and fasciculations, he has slow spastic tongue movement,    Motor:  no significant weakness, mild to moderate spasticity of bilateral lower extremities Sensory: preserved bilateral ankle vibratory sensation,  mildly length-dependent decreased pinprick to above ankle level Gait and Station:  wide-based, stiff, unsteady gait Reflexes:  hyperreflexia, UEs 2+, patellar 3+/3+, plantar responses were extensor  bilaterally   Assessment  and Plan:   58 year-old gentleman with gradual onset, but progressive worsening spastic dysarthria, profound cervical dystonia, spastic paraplegia, gait difficulty,MRI showed advanced multilevel degenerative changes of the cervical spine, most pronounced at C5 and 6, there was severe central canal stenosis, compressing on cervical cord,, with focal myelomalacia, severe bilateral foraminal narrowing. he had a posterior decompression, and fusion  by neurosurgeon [name] in Dec 2013 at [facility], return for repeat Botox injection. His severe cervical dystonia has much improved with treatment.  300 units of BOTOX A were injected   Right  sternocleidomastoid 50 units   left splenius capitis 25  Left splenis cervix 25 +20=45  Left longissimus 25 units   Left semisplenius 25 units Left oblique capitus inferior 12.5 units  Right levator scapula 25 units Right oblique capitus inferior 12.5 units  Patient tolerated the injection very well, will return to clinic in 3 months for repeat injection, will inject botox 200 units on next visit.

## 2014-04-06 ENCOUNTER — Telehealth: Payer: Self-pay | Admitting: Neurology

## 2014-04-06 MED ORDER — ONABOTULINUMTOXINA 100 UNITS IJ SOLR
300.0000 [IU] | Freq: Once | INTRAMUSCULAR | Status: AC
  Administered 2014-04-06: 300 [IU] via INTRAMUSCULAR

## 2014-04-06 NOTE — Telephone Encounter (Signed)
Patient calling to request that his paper script for pain medication be mailed to him. Please call patient and advise.

## 2014-04-07 NOTE — Telephone Encounter (Signed)
Called pt to inform him that his Rx was already faxed over to his Pharmacy and if he has any other problems, questions or concerns to call the office. Pt stated that he has already picked up his Rx.

## 2014-05-17 DIAGNOSIS — Z981 Arthrodesis status: Secondary | ICD-10-CM | POA: Diagnosis not present

## 2014-05-31 NOTE — Telephone Encounter (Signed)
Noted  

## 2014-06-21 DIAGNOSIS — G8220 Paraplegia, unspecified: Secondary | ICD-10-CM | POA: Diagnosis not present

## 2014-06-21 DIAGNOSIS — Z0000 Encounter for general adult medical examination without abnormal findings: Secondary | ICD-10-CM | POA: Diagnosis not present

## 2014-06-21 DIAGNOSIS — Z1211 Encounter for screening for malignant neoplasm of colon: Secondary | ICD-10-CM | POA: Diagnosis not present

## 2014-06-21 DIAGNOSIS — Z139 Encounter for screening, unspecified: Secondary | ICD-10-CM | POA: Diagnosis not present

## 2014-06-21 DIAGNOSIS — L259 Unspecified contact dermatitis, unspecified cause: Secondary | ICD-10-CM | POA: Diagnosis not present

## 2014-06-21 DIAGNOSIS — Z1331 Encounter for screening for depression: Secondary | ICD-10-CM | POA: Diagnosis not present

## 2014-06-21 DIAGNOSIS — Z125 Encounter for screening for malignant neoplasm of prostate: Secondary | ICD-10-CM | POA: Diagnosis not present

## 2014-07-05 ENCOUNTER — Encounter: Payer: Self-pay | Admitting: Neurology

## 2014-07-05 ENCOUNTER — Ambulatory Visit (INDEPENDENT_AMBULATORY_CARE_PROVIDER_SITE_OTHER): Payer: Medicare Other | Admitting: Neurology

## 2014-07-05 VITALS — Ht 67.0 in | Wt 164.0 lb

## 2014-07-05 DIAGNOSIS — G243 Spasmodic torticollis: Secondary | ICD-10-CM

## 2014-07-05 DIAGNOSIS — G249 Dystonia, unspecified: Secondary | ICD-10-CM

## 2014-07-05 MED ORDER — ZOLPIDEM TARTRATE 5 MG PO TABS: 5.0000 mg | ORAL_TABLET | Freq: Every evening | ORAL | Status: DC | PRN

## 2014-07-05 MED ORDER — ONABOTULINUMTOXINA 100 UNITS IJ SOLR: 300.0000 [IU] | Freq: Once | INTRAMUSCULAR | Status: DC

## 2014-07-05 MED ORDER — ONABOTULINUMTOXINA 100 UNITS IJ SOLR
300.0000 [IU] | Freq: Once | INTRAMUSCULAR | Status: AC
  Administered 2014-07-05: 300 [IU] via INTRAMUSCULAR

## 2014-07-05 NOTE — Progress Notes (Signed)
History of Present Illness   [facility] is a 58 year old right-handed Caucasian male.  He has past medical history of hyperlipidemia, depression, presenting with 3 years history of gradual onset language difficulty, gait difficulty.  He was evaluated by [facility] neurologist, reported normal MRI of brain, he was diagnosed with cervical dystonia, was given Botox injection of the cervical muscles, which has improved his symptoms temporarily.  He moved back to the [name] in April 2012, began to seek medical attention, initially evaluated by [name], reported again normal MRI of brain, also had trial of Haldol and baclofen without much improvement.   Sinemet did not help him. Artane and baclofen provide no help either. 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  He was evaluated by local neurosurgeon [name] at [facility], Because concomitant central nervous system degenerative disorder, cervical dystonia, he is hesitate to perform decompression surgery. I later referred him to [facility] neurology, evaluated by [name], who has suggested Botox injection to alleviate his cervical dystonia symptoms first, may consider cervical decompression surgery later. Also, likely there is a component of central nervous system degenerative disorder, such as spinocerebellar ataxia,.   He has profound dysarthria, also dysphagia, gait difficulty, frequent falling, he denies sensory changes.  Normal or negative CMP, PEP, CBC, HIV, Lyme disease, SSA/B, TSH, ACE, RPR, CRP, CPK.  He received first EMG guided Botox injection since March 2012, he reported  great improvement, he has been receiving BOTOX injection every 3 months, there is significant improvement of his abnormal neck  postural, but still with severe retrocollis, left turn, right tilt.  he began to have moderate improvement after repeat injection.  he was evaluated by his neurosurgeon [name] at [facility], repeat MRI cervical showed advanced multilevel degenerative changes of the cervical spine, most pronounced at C5-C6, where there is severe central stenosis, impression on the cord with focal myelomalacia and severe bilateral neuroforaminal narrowing. Overall appearance is similar to prior study from 09/18/2010. He had posterior decompression  and fusion surgery in December 2013, recovering well, but complains of neck muscle spasm, and chest muscle spasm continued spastic gait difficulty, occasional urinary incontinence, his posture is much improved, but has limited range of motion of his neck  UPDATE Sep 9th 2015; Last injection was in  June 10th 2015. He responded very well, he has moved into independent living since April 2014,  He continued to have some neck muscle pain, especially at left posterior neck,  Continue with gait difficulty. Today he also complains of insomnia.      Physical Exam  Neck: s/p posterior cervical fusion  Neurologic Exam  Mental Status: Patient had much improved, cervical dystonia, with severe limited range of motion of his neck, slight retrocollis,  mild to moderate rotatation to left side, slight right shoulder elevation.  slow spastic speech. Cranial Nerves: CN II-XII pupils were equal round reactive to light.  Extraocular movements were full.   facial symmetric,uvula tongue midline, there was no tongue atrophy and fasciculations, he has slow spastic tongue movement,    Motor:  no significant weakness, mild to moderate spasticity of bilateral lower extremities Sensory: preserved bilateral ankle vibratory sensation,  mildly length-dependent decreased pinprick to above ankle level Gait and Station:  wide-based, stiff, unsteady gait Reflexes:  hyperreflexia, UEs 2+, patellar 3+/3+,  plantar responses were extensor bilaterally   Assessment and Plan:   58 year-old gentleman with gradual onset, but progressive worsening spastic dysarthria, profound cervical dystonia, spastic paraplegia, gait difficulty,MRI showed advanced multilevel degenerative changes of the cervical spine, most pronounced at C5 and 6, there was severe central canal stenosis, compressing on cervical cord,, with focal myelomalacia, severe bilateral foraminal narrowing. he had a posterior decompression, and fusion  by neurosurgeon [name] in Dec 2013 at [facility], return for repeat Botox injection. His severe cervical dystonia has much improved with treatment.  300 units of BOTOX A were injected   Right  sternocleidomastoid 50 units   left splenius capitis 25  Left splenis cervix 25 x3=75   Left longissimus 25 units   Left semisplenius 25 x2 =50 units  Right splenius capitis 25 Right levator scapular 25  Right iliocostalis 25 units.  Patient tolerated the injection very well, will return to clinic in 3 months for repeat injection

## 2014-08-03 DIAGNOSIS — H25813 Combined forms of age-related cataract, bilateral: Secondary | ICD-10-CM | POA: Diagnosis not present

## 2014-08-03 DIAGNOSIS — H524 Presbyopia: Secondary | ICD-10-CM | POA: Diagnosis not present

## 2014-08-03 DIAGNOSIS — H52203 Unspecified astigmatism, bilateral: Secondary | ICD-10-CM | POA: Diagnosis not present

## 2014-08-10 ENCOUNTER — Telehealth: Payer: Self-pay | Admitting: Neurology

## 2014-08-10 NOTE — Telephone Encounter (Signed)
Pt states he had side effects from the sleep medication that was prescribed and has stopped taking it. He would like to know if he can have a different RX

## 2014-08-11 NOTE — Telephone Encounter (Signed)
Left message for him to call back for discussion

## 2014-08-14 ENCOUNTER — Telehealth: Payer: Self-pay | Admitting: Neurology

## 2014-08-14 NOTE — Telephone Encounter (Signed)
Patient calling back to discuss previous phone note about sleep medication.

## 2014-08-14 NOTE — Telephone Encounter (Signed)
Patient wants something other than the Ambien 5mg   Patient's states he does not like the way Ambien makes him feel . Patient states he is having trouble sleeping. Stated to patient that Dr. Krista Blue is out of the office for the rest of the day. Dr. Krista Blue will return 08-15-2014. Patient was fine to wait for Dr. Krista Blue.

## 2014-08-15 MED ORDER — CLONAZEPAM 0.5 MG PO TABS: 0.5000 mg | ORAL_TABLET | Freq: Every evening | ORAL | Status: DC | PRN

## 2014-08-15 NOTE — Telephone Encounter (Signed)
Rx added to med list.  I called the pharmacy, was transferred to the Dr line.  Provided Rx info.  Pharmacy indicated they will being processing Rx within an hour.

## 2014-08-15 NOTE — Telephone Encounter (Signed)
I have called him, he has trouble with Lorrin Mais, he noticed increased neck stiffiness, pain,   Jessica, please call in Clonazepam 0.5mg  po qhs prn 30 tab with 3 refill.

## 2014-10-04 ENCOUNTER — Ambulatory Visit (INDEPENDENT_AMBULATORY_CARE_PROVIDER_SITE_OTHER): Payer: Medicare Other | Admitting: Neurology

## 2014-10-04 ENCOUNTER — Encounter: Payer: Self-pay | Admitting: Neurology

## 2014-10-04 DIAGNOSIS — G243 Spasmodic torticollis: Secondary | ICD-10-CM

## 2014-10-04 DIAGNOSIS — R269 Unspecified abnormalities of gait and mobility: Secondary | ICD-10-CM

## 2014-10-04 DIAGNOSIS — G8220 Paraplegia, unspecified: Secondary | ICD-10-CM

## 2014-10-04 DIAGNOSIS — G959 Disease of spinal cord, unspecified: Secondary | ICD-10-CM

## 2014-10-04 DIAGNOSIS — G249 Dystonia, unspecified: Secondary | ICD-10-CM

## 2014-10-04 NOTE — Progress Notes (Signed)
History of Present Illness   [facility] is a 58 year old right-handed Caucasian male.  He has past medical history of hyperlipidemia, depression, presenting with 3 years history of gradual onset language difficulty, gait difficulty.  He was evaluated by [facility] neurologist, reported normal MRI of brain, he was diagnosed with cervical dystonia, was given Botox injection of the cervical muscles, which has improved his symptoms temporarily.  He moved back to the [name] in April 2012, began to seek medical attention, initially evaluated by Dr. Metta Daniel, reported again normal MRI of brain, also had trial of Haldol and baclofen without much improvement.   Sinemet did not help him. Artane and baclofen provide no help either. 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  He was evaluated by local neurosurgeon Dr. Granville [facility] at [facility], Because concomitant central nervous system degenerative disorder, cervical dystonia, he is hesitate to perform decompression surgery. I later referred him to [facility], evaluated by Dr. Rush Daniel, who has suggested Botox injection to alleviate his cervical dystonia symptoms first, may consider cervical decompression surgery later. Also, likely there is a component of central nervous system degenerative disorder, such as spinocerebellar ataxia,.   He has profound dysarthria, also dysphagia, gait difficulty, frequent falling, he denies sensory changes.  Normal or negative CMP, PEP, CBC, HIV, Lyme disease, SSA/B, TSH, ACE, RPR, CRP, CPK.  He received first EMG guided Botox injection since March 2012, he reported  great improvement, he has been receiving BOTOX injection every 3 months, there is significant improvement of his abnormal neck  postural, but still with severe retrocollis, left turn, right tilt.  he began to have moderate improvement after repeat injection.  he was evaluated by his neurosurgeon Dr. Aubery [facility] at [facility], repeat MRI cervical showed advanced multilevel degenerative changes of the cervical spine, most pronounced at C5-C6, where there is severe central stenosis, impression on the cord with focal myelomalacia and severe bilateral neuroforaminal narrowing. Overall appearance is similar to prior study from 09/18/2010. He had posterior decompression  and fusion surgery in December 2013, recovering well, but complains of neck muscle spasm, and chest muscle spasm continued spastic gait difficulty, occasional urinary incontinence, his posture is much improved, but has limited range of motion of his neck  UPDATE Sep 9th 2015; Last injection was in Sept 2015. He responded very well, he has moved into independent living since April 2014,  He continued to have some neck muscle pain, especially at left posterior neck,  Continue with gait difficulty.   For his insomnia, we have tried Ambien, cause confusion, he could not tolerated it, clonazepam 0.5 milligrams 1-2 tablets every night works well, helping him relaxing his neck muscles     Physical Exam  Neck: s/p posterior cervical fusion  Neurologic Exam  Mental Status: Patient had much improved, cervical dystonia, with severe limited range of motion of his neck, slight retrocollis,  mild to moderate rotatation to left side, slight right shoulder elevation.  slow spastic speech. Cranial Nerves: CN II-XII pupils were equal round reactive to light.  Extraocular movements were full.   facial symmetric,uvula tongue midline, there was no tongue atrophy and fasciculations, he has slow spastic tongue movement,    Motor:  no significant weakness, mild to moderate spasticity of bilateral lower extremities Sensory: preserved bilateral ankle vibratory sensation,  mildly length-dependent  decreased pinprick to above ankle level Gait and Station:  wide-based, stiff, unsteady gait Reflexes:  hyperreflexia, UEs 2+, patellar 3+/3+, plantar responses were extensor bilaterally   Assessment and Plan:   58 year-old gentleman with gradual onset, but progressive worsening spastic dysarthria, profound cervical dystonia, spastic paraplegia, gait difficulty,MRI showed advanced multilevel degenerative changes of the cervical spine, most pronounced at C5 and 6, there was severe central canal stenosis, compressing on cervical cord,, with focal myelomalacia, severe bilateral foraminal narrowing. he had a posterior decompression, and fusion  by neurosurgeon Dr. Aubery [facility] in Dec 2013 at [facility], return for repeat Botox injection. His severe cervical dystonia has much improved with treatment.  300 units of BOTOX A were injected   Right  sternocleidomastoid 50 units  Right inferior oblique capitis 25 Left inferior optic capitis 25   left splenius capitis 25  Left splenis cervix 25 x3=75   Left longissimus 25 units   Left semisplenius 25   Right levator scapular 25  Left iliocostalis 25 units.  Patient tolerated the injection very well, will return to clinic in 3 months for repeat injection   [facility], M.D. Ph.D.  [facility] [place], [place] [address] Phone: [phone] Fax:      [phone]

## 2014-10-05 MED ORDER — ONABOTULINUMTOXINA 100 UNITS IJ SOLR: 100.0000 [IU] | Freq: Once | INTRAMUSCULAR | Status: DC

## 2014-10-05 MED ORDER — ONABOTULINUMTOXINA 100 UNITS IJ SOLR
300.0000 [IU] | Freq: Once | INTRAMUSCULAR | Status: AC
  Administered 2014-10-05: 300 [IU] via INTRAMUSCULAR

## 2014-10-05 MED ORDER — ONABOTULINUMTOXINA 100 UNITS IJ SOLR
300.0000 [IU] | Freq: Once | INTRAMUSCULAR | Status: DC
Start: 2014-10-05 — End: 2015-04-23

## 2014-12-26 ENCOUNTER — Encounter: Payer: Self-pay | Admitting: *Deleted

## 2014-12-27 ENCOUNTER — Ambulatory Visit (INDEPENDENT_AMBULATORY_CARE_PROVIDER_SITE_OTHER): Payer: Medicare Other | Admitting: Neurology

## 2014-12-27 ENCOUNTER — Encounter: Payer: Self-pay | Admitting: Neurology

## 2014-12-27 VITALS — BP 122/64 | HR 68 | Ht 67.0 in | Wt 159.0 lb

## 2014-12-27 DIAGNOSIS — G249 Dystonia, unspecified: Secondary | ICD-10-CM

## 2014-12-27 DIAGNOSIS — G4700 Insomnia, unspecified: Secondary | ICD-10-CM

## 2014-12-27 DIAGNOSIS — G243 Spasmodic torticollis: Secondary | ICD-10-CM | POA: Diagnosis not present

## 2014-12-27 MED ORDER — CLONAZEPAM 0.5 MG PO TABS: ORAL_TABLET | ORAL | Status: DC

## 2014-12-27 MED ORDER — ONABOTULINUMTOXINA 100 UNITS IJ SOLR
300.0000 [IU] | Freq: Once | INTRAMUSCULAR | Status: AC
  Administered 2014-12-27: 300 [IU] via INTRAMUSCULAR

## 2014-12-27 NOTE — Progress Notes (Signed)
History of Present Illness   [facility] is a 59 year old right-handed Caucasian male.  He has past medical history of hyperlipidemia, depression, presenting with 3 years history of gradual onset language difficulty, gait difficulty.  He was evaluated by [facility] neurologist, reported normal MRI of brain, he was diagnosed with cervical dystonia, was given Botox injection of the cervical muscles, which has improved his symptoms temporarily.  He moved back to the [name] in April 2012, began to seek medical attention, initially evaluated by [name], reported again normal MRI of brain, also had trial of Haldol and baclofen without much improvement.   Sinemet did not help him. Artane and baclofen provide no help either. 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  He was evaluated by local neurosurgeon [name] at [facility], Because concomitant central nervous system degenerative disorder, cervical dystonia, he is hesitate to perform decompression surgery. I later referred him to [facility] neurology, evaluated by [name], who has suggested Botox injection to alleviate his cervical dystonia symptoms first, may consider cervical decompression surgery later. Also, likely there is a component of central nervous system degenerative disorder, such as spinocerebellar ataxia,.   He has profound dysarthria, also dysphagia, gait difficulty, frequent falling, he denies sensory changes.  Normal or negative CMP, PEP, CBC, HIV, Lyme disease, SSA/B, TSH, ACE, RPR, CRP, CPK.  He received first EMG guided Botox injection since March 2012, he reported  great improvement, he has been receiving BOTOX injection every 3 months, there is significant improvement of his abnormal neck  postural, but still with severe retrocollis, left turn, right tilt.  he began to have moderate improvement after repeat injection.  he was evaluated by his neurosurgeon [name] at [facility], repeat MRI cervical showed advanced multilevel degenerative changes of the cervical spine, most pronounced at C5-C6, where there is severe central stenosis, impression on the cord with focal myelomalacia and severe bilateral neuroforaminal narrowing. Overall appearance is similar to prior study from 09/18/2010. He had posterior decompression  and fusion surgery in December 2013, recovering well, but complains of neck muscle spasm, and chest muscle spasm continued spastic gait difficulty, occasional urinary incontinence, his posture is much improved, but has limited range of motion of his neck  UPDATE March 2nd 2016; Last injection was in Dec 2015. He responded very well, he has moved into independent living since April 2014,  He continued to have some neck muscle pain, especially at left posterior neck,  Continue with gait difficulty.   For his insomnia, we have tried Ambien, cause confusion, he could not tolerated it, clonazepam 0.5 milligrams 1-2 tablets every night works well, helping him relaxing his neck muscles     Physical Exam  Neck: s/p posterior cervical fusion  Neurologic Exam  Mental Status: Patient had much improved, cervical dystonia, with severe limited range of motion of his neck, difficulty turning to right side, slight retrocollis,  mild to moderate rotatation to left side, mild right shoulder elevation.  slow spastic speech. Cranial Nerves: CN II-XII pupils were equal round reactive to light.  Extraocular movements were full.   facial symmetric,uvula tongue midline, there was no tongue atrophy and fasciculations, he has slow spastic tongue movement,    Motor:  no significant weakness, mild to moderate spasticity of bilateral lower extremities Sensory: preserved bilateral ankle  vibratory  sensation,  mildly length-dependent decreased pinprick to above ankle level Gait and Station:  wide-based, stiff, unsteady gait Reflexes:  hyperreflexia, UEs 2+, patellar 3+/3+, plantar responses were extensor bilaterally   Assessment and Plan:   59 year-old gentleman with gradual onset, but progressive worsening spastic dysarthria, profound cervical dystonia, spastic paraplegia, gait difficulty,MRI showed advanced multilevel degenerative changes of the cervical spine, most pronounced at C5 and 6, there was severe central canal stenosis, compressing on cervical cord,, with focal myelomalacia, severe bilateral foraminal narrowing. he had a posterior decompression, and fusion  by neurosurgeon [name] in Dec 2013 at [facility], return for repeat Botox injection. His severe cervical dystonia has much improved with treatment.  300 units of BOTOX A were injected under EMG guidance.  Right  sternocleidomastoid 50 units  Right inferior oblique capitis 25 Left inferior optic capitis 25   left splenius capitis 25 x3= 75 Left splenis cervix 25 x2 =50 units  Left longissimus 25 units   Left semisplenius 25   Right levator scapular 25  Patient tolerated the injection very well, will return to clinic in 3 months for repeat injection   Marcial Pacas, M.D. Ph.D.  [facility] [place], [place] [address] Phone: [phone] Fax:      [phone]

## 2015-02-15 ENCOUNTER — Encounter: Payer: Self-pay | Admitting: Neurology

## 2015-02-15 ENCOUNTER — Telehealth: Payer: Self-pay | Admitting: Neurology

## 2015-02-15 NOTE — Telephone Encounter (Signed)
mailed letter to inform patient of workflow change and discontinuing injections.

## 2015-02-22 ENCOUNTER — Telehealth: Payer: Self-pay | Admitting: Neurology

## 2015-02-22 NOTE — Telephone Encounter (Signed)
Needs a referral from Dr. Krista Blue to another Neurologic office that will do Botox because his insurance won't do botox do here. The best number to contact him is [phone]

## 2015-02-23 NOTE — Telephone Encounter (Signed)
Hold referral - pending Botox meeting - pt aware.

## 2015-02-27 NOTE — Telephone Encounter (Signed)
Patient called wanting to know about his referral. Please call and advise.

## 2015-02-27 NOTE — Telephone Encounter (Signed)
Pt aware his information has been sent to [facility].

## 2015-03-07 ENCOUNTER — Telehealth: Payer: Self-pay | Admitting: *Deleted

## 2015-03-07 NOTE — Telephone Encounter (Signed)
Patient is a BOTOX referral from [facility]. He is calling to follow up on his referral to our clinic. Was told the referral should take about a week to process, he asking for the status

## 2015-03-08 NOTE — Telephone Encounter (Signed)
Kalub is calling back again about this referral.  I do not see one in the system and that he has been called. This is a [facility] referral for botox per Ken's message

## 2015-03-09 NOTE — Telephone Encounter (Signed)
Patrick Daniel Can you call this patient for me. Please and thank you.

## 2015-03-14 ENCOUNTER — Telehealth: Payer: Self-pay | Admitting: Neurology

## 2015-03-14 NOTE — Telephone Encounter (Signed)
I have called him, he will receive next EMG guided botox by Dr. Read Drivers in April 10 2015

## 2015-04-10 ENCOUNTER — Encounter: Payer: Self-pay | Admitting: Physical Medicine & Rehabilitation

## 2015-04-10 ENCOUNTER — Ambulatory Visit (HOSPITAL_BASED_OUTPATIENT_CLINIC_OR_DEPARTMENT_OTHER): Payer: Medicare Other | Admitting: Physical Medicine & Rehabilitation

## 2015-04-10 ENCOUNTER — Encounter: Payer: Medicare Other | Attending: Physical Medicine & Rehabilitation

## 2015-04-10 VITALS — BP 111/64 | HR 87 | Resp 14

## 2015-04-10 DIAGNOSIS — G243 Spasmodic torticollis: Secondary | ICD-10-CM | POA: Diagnosis not present

## 2015-04-10 NOTE — Progress Notes (Signed)
   Subjective:    Patient ID: Patrick Daniel, male    DOB: 06-14-1956, 59 y.o.   MRN: [id number]  HPI    Review of Systems     Objective:   Physical Exam  Muscle activity noted in the right sternocleidomastoid, left splenius capitis, left obliquis capitus, left longissimus on EMG during the procedure.      Assessment & Plan:  Botox Injection for spasticity using needle EMG guidance  Dilution: 50 Units/ml Indication: Severe spasticity which interferes with ADL,mobility and/or  hygiene and is unresponsive to medication management and other conservative care Informed consent was obtained after describing risks and benefits of the procedure with the patient. This includes bleeding, bruising, infection, excessive weakness, or medication side effects. A REMS form is on file and signed. Needle: 27g 1 " needle electrode Number of units per muscle 300 units of BOTOX A were injected under EMG guidance.  Right sternocleidomastoid 50 units  Right inferior oblique capitis 25 Left inferior oblique capitis 25  left splenius capitis 25 x3= 75 Left splenius cervix 25 x2 =50 units  Left longissimus 25 units  Left semispinalis 25   Right levator scapular 25 All injections were done after obtaining appropriate EMG activity and after negative drawback for blood. The patient tolerated the procedure well. Post procedure instructions were given. A followup appointment was made.   Next visit consider changing treatment,No EMG activity was noted in the left semispinalis capitis and the right levator scapula as well as right the inferior oblique  Capitis muscle

## 2015-04-10 NOTE — Patient Instructions (Signed)

## 2015-04-23 ENCOUNTER — Ambulatory Visit (INDEPENDENT_AMBULATORY_CARE_PROVIDER_SITE_OTHER): Payer: Medicare Other | Admitting: Neurology

## 2015-04-23 ENCOUNTER — Encounter: Payer: Self-pay | Admitting: Neurology

## 2015-04-23 VITALS — BP 118/81 | HR 93 | Ht 67.0 in | Wt 146.5 lb

## 2015-04-23 DIAGNOSIS — G249 Dystonia, unspecified: Secondary | ICD-10-CM | POA: Diagnosis not present

## 2015-04-23 DIAGNOSIS — G4700 Insomnia, unspecified: Secondary | ICD-10-CM | POA: Diagnosis not present

## 2015-04-23 DIAGNOSIS — G8220 Paraplegia, unspecified: Secondary | ICD-10-CM

## 2015-04-23 DIAGNOSIS — R269 Unspecified abnormalities of gait and mobility: Secondary | ICD-10-CM | POA: Diagnosis not present

## 2015-04-23 DIAGNOSIS — G959 Disease of spinal cord, unspecified: Secondary | ICD-10-CM | POA: Diagnosis not present

## 2015-04-23 DIAGNOSIS — R471 Dysarthria and anarthria: Secondary | ICD-10-CM | POA: Diagnosis not present

## 2015-04-23 NOTE — Progress Notes (Signed)
PATIENT: Patrick Daniel DOB: May 18, 1956  Chief Complaint  Patient presents with  . Cervical Dystonia    He had his Botox injections with Dr. Read Drivers on 04/10/15.   . Gait Disturbance    His gait is slow and unsteady.  He is still able to ambulate with the assistance of a cane.    HISTORICAL  [name] return to evaluation for his continued gait difficulty   [name] is a 59 year old right-handed Caucasian male.  He has past medical history of hyperlipidemia, depression, He presented with gradual onset language difficulty, gait difficulty since 2009.   He was evaluated by [facility] neurologist at [facility], reported normal MRI of brain, he was diagnosed with cervical dystonia, was given Botox injection of the cervical muscles, which has improved his symptoms temporarily.  He moved back to the [name] in April 2012, began to seek medical attention, initially evaluated by [name], reported again normal MRI of brain, also had trial of Haldol and baclofen without much improvement.   Sinemet did not help him. Artane and baclofen provide no help either. 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  He was evaluated by local neurosurgeon [name] at [facility], Because concomitant central nervous system degenerative disorder, cervical dystonia, he is hesitate to perform decompression surgery. I later referred him to [facility] neurology, evaluated by [name], who has suggested Botox injection to alleviate his cervical dystonia symptoms first, may consider cervical decompression surgery later. Also, likely there is a component of central nervous system degenerative disorder, such as spinocerebellar ataxia,.   He has significant dysarthria, also  dysphagia, gait difficulty, frequent falling, he denies sensory changes.  Normal or negative CMP, PEP, CBC, HIV, Lyme disease, SSA/B, TSH, ACE, RPR, CRP, CPK.  He received first EMG guided Botox injection since March 2012, he reported  great improvement, he has been receiving BOTOX injection every 3 months, there is significant improvement of his abnormal neck postural, but still with severe retrocollis, left turn, right tilt.  he began to have moderate improvement after repeat injection.  he was evaluated by his neurosurgeon [name] at [facility], repeat MRI cervical showed advanced multilevel degenerative changes of the cervical spine, most pronounced at C5-C6, where there is severe central stenosis, impression on the cord with focal myelomalacia and severe bilateral neuroforaminal narrowing. Overall appearance is similar to prior study from 09/18/2010. He had posterior decompression  and fusion surgery in December 2013, recovering well, but complains of neck muscle spasm, and chest muscle spasm continued spastic gait difficulty, occasional urinary incontinence, his posture is much improved, but has limited range of motion of his neck  UPDATE June 27th 2016: He had EMG guided BOTOX injection by me since March 2012, most recent EMG guided Botox injection was by [name] April 10 2015,300 units of Botox a  Today he came in for neurological evaluation for continued unsteady gait, difficulty finding a comfortable position, urinary urgency, he denies constipation, denies upper extremity paresthesia or weakness, slow worsening dysarthria,  Overall, is continued slow progression compared to his baseline,  ROS: Neck pain, neck stiffness, memory loss, numbness, speech difficulty   HOME MEDICATIONS: Current Outpatient Prescriptions  Medication Sig Dispense Refill  . botulinum toxin Type A (BOTOX) 100 UNITS SOLR injection Inject into the muscle.    Marland Kitchen  clonazePAM (KLONOPIN) 0.5 MG tablet 1- 2 tabs  qhs for insomnia 50 tablet 5  . loratadine (CLARITIN) 10 MG tablet Take 10 mg by mouth daily as needed for allergies.    . mometasone (ELOCON) 0.1 % cream Apply 1 application topically daily.    . Multiple Vitamins tablet Take 1 tablet by mouth.    . traMADol (ULTRAM) 50 MG tablet Take 1 tablet (50 mg total) by mouth 3 (three) times daily. 90 tablet 3   No current facility-administered medications for this visit.    PAST MEDICAL HISTORY: Past Medical History  Diagnosis Date  . Paraplegia   . Myelopathy   . Gait disturbance   . Dysarthria   . Dystonia     PAST SURGICAL HISTORY: Past Surgical History  Procedure Laterality Date  . Neck surgery      dec 2014    FAMILY HISTORY: Family History  Problem Relation Age of Onset  . High Cholesterol Sister   . High blood pressure Sister   . Diabetes Sister   . High blood pressure Sister   . Heart disease Mother   . Cancer Mother   . Heart Problems Father     SOCIAL HISTORY:  History   Social History  . Marital Status: Single    Spouse Name: N/A  . Number of Children: 1  . Years of Education: N/A   Occupational History  . retired     Therapist, art   Social History Main Topics  . Smoking status: Former Smoker    Types: Cigarettes    Quit date: 02/01/2013  . Smokeless tobacco: Never Used     Comment: quit   . Alcohol Use: No  . Drug Use: No  . Sexual Activity: Not on file   Other Topics Concern  . Not on file   Social History Narrative   Patient lives in independent living [place]. Patient is retired from [employer]. Patient has one child. Right handed. Patient is divorced.   Caffeine- 12 - Coffee daily - one daily -soda     PHYSICAL EXAM   Filed Vitals:   04/23/15 1339  BP: 118/81  Pulse: 93  Height: 5\' 7"  (1.702 m)  Weight: 146 lb 8 oz (66.452 kg)    Not recorded      Body mass index is 22.94 kg/(m^2).  PHYSICAL EXAMNIATION:  Gen: NAD, conversant, well nourised, obese, well groomed                      Cardiovascular: Regular rate rhythm, no peripheral edema, warm, nontender. Eyes: Conjunctivae clear without exudates or hemorrhage Neck: Supple, no carotid bruise. Pulmonary: Clear to auscultation bilaterally   NEUROLOGICAL EXAM:  MENTAL STATUS: Speech: Slow spastic dysarthria Cognition:    The patient is oriented to person, place, and time;     recent and remote memory intact;     language fluent;     normal attention, concentration,     fund of knowledge.  He has severe limited range of motion of his neck, difficulty turning to right side, mild retrocollis,  mild to moderate rotatation to left side, mild right shoulder elevation.  slow spastic speech.  CRANIAL NERVES: CN II: Visual fields are full to confrontation. Fundoscopic exam is normal with sharp discs and no vascular changes. Pupils were equal round reactive to light CN III, IV, VI: extraocular movement are normal. No ptosis. CN V: Facial sensation is intact to pinprick in all 3 divisions bilaterally.  Corneal responses are intact.  CN VII: Face is symmetric with normal eye closure and smile. CN VIII: Hearing is normal to rubbing fingers CN IX, X: Palate elevates symmetrically. Phonation is normal. CN XI: Head turning and shoulder shrug are intact CN XII: Tongue is midline with normal movements and no atrophy.  MOTOR: Bilateral upper extremity normal tone bulk and strength, he has moderate bilateral lower extremity spasticity, no significant weakness  REFLEXES: Reflexes are 3 and symmetric at the biceps, triceps, knees, and ankles. Plantar responses are extensor bilaterally .  SENSORY: Light touch, pinprick, position sense, and vibration sense are intact in fingers and toes.  COORDINATION: Rapid alternating movements and fine finger movements are intact. There is no dysmetria on finger-to-nose and heel-knee-shin. There are no abnormal or extraneous movements.   GAIT/STANCE: Need to push up to get up from seated  position, narrow based, stiff, unsteady gait   DIAGNOSTIC DATA (LABS, IMAGING, TESTING) - I reviewed patient records, labs, notes, testing and imaging myself where available.  ASSESSMENT AND PLAN  Quintarius Ferns is a 59 y.o. male   1, cervical dystonia, continue EMG guided Botox injection every 3 months 2. 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 3, dysarthria, slow spastic tongue movement, certainly would not explained by his cervical dystonia, or cervical myelopathy, central nervous system degenerative disorder 4, MRI of brain, cervical spine 5. Return to clinic in one month     Orders Placed This Encounter  Procedures  . MR Cervical Spine Wo Contrast  . MR Brain Wo Contrast    Modified Medications   No medications on file    Return in about 1 month (around 05/23/2015).  Marcial Pacas, M.D. Ph.D.  [facility] [address], [place], [place] [address] Ph: [phone] Fax: [phone]

## 2015-05-02 ENCOUNTER — Telehealth: Payer: Self-pay | Admitting: Neurology

## 2015-05-02 NOTE — Telephone Encounter (Signed)
Left message to return call 

## 2015-05-02 NOTE — Telephone Encounter (Signed)
Patient called and requested to get a prescription for muscle relaxer's from Dr. Krista Blue. He states that they discussed this the last time they met. Please call and advise.

## 2015-05-02 NOTE — Telephone Encounter (Signed)
Patient returned call. Please call and advise.  °

## 2015-05-02 NOTE — Telephone Encounter (Signed)
Spoke to [place] - says trying a muscle relaxer was discussed at his last office visit and he is willing to try one now.  He is aware it may be Monday the 25th before a prescription is sent to the pharmacy.  He said this is no problem and that he is able to wait.  Allergies verified - no known drug allergies.

## 2015-05-04 ENCOUNTER — Ambulatory Visit
Admission: RE | Admit: 2015-05-04 | Discharge: 2015-05-04 | Disposition: A | Discharge status: Home / Self Care | Payer: Medicare Other | Source: Ambulatory Visit | Attending: Neurology | Admitting: Neurology

## 2015-05-04 DIAGNOSIS — G959 Disease of spinal cord, unspecified: Secondary | ICD-10-CM

## 2015-05-04 DIAGNOSIS — G8220 Paraplegia, unspecified: Secondary | ICD-10-CM

## 2015-05-04 DIAGNOSIS — G249 Dystonia, unspecified: Secondary | ICD-10-CM

## 2015-05-04 DIAGNOSIS — G4700 Insomnia, unspecified: Secondary | ICD-10-CM

## 2015-05-04 DIAGNOSIS — R471 Dysarthria and anarthria: Secondary | ICD-10-CM | POA: Diagnosis not present

## 2015-05-04 DIAGNOSIS — R269 Unspecified abnormalities of gait and mobility: Secondary | ICD-10-CM | POA: Diagnosis not present

## 2015-05-07 ENCOUNTER — Telehealth: Payer: Self-pay | Admitting: Neurology

## 2015-05-07 MED ORDER — TIZANIDINE HCL 2 MG PO CAPS
2.0000 mg | ORAL_CAPSULE | Freq: Three times a day (TID) | ORAL | Status: DC
Start: 2015-05-07 — End: 2015-05-28

## 2015-05-07 NOTE — Telephone Encounter (Signed)
[name], please let patient's know I have called in tizanidine 2 mg 3 times a day for his muscle spasm,

## 2015-05-07 NOTE — Telephone Encounter (Signed)
Spoke to [place] - he is aware of results.

## 2015-05-07 NOTE — Telephone Encounter (Signed)
Michelle: Please call patient, MRI of the brain showed cerebellar atrophy chronic changes, MRI of the cervical showed evidence of previous decompression surgery, multilevel degenerative changes, I will go over detail at his next follow-up visit    : This is an abnormal MRI of the cervical spine without contrast showing the following: 1. Prior C4-C6 ACDF and C2-T2 posterior fusion. The vertebral bodies are normally aligned. Metal artifact from these fusions obscures many of the images. 2. Multilevel degenerative changes as detailed above. Most significant findings are at C5-C6. There appears to be a posterior bony ridge that mildly narrows the neural canal but does not appear to be causing spinal cord compression. However, there is possible tramadol narrowing to the right that could lead to some right C6 nerve root impingement. There are moderate degenerative changes at other levels with less potential for nerve root impingement. 3. Cerebellar atrophy is noted. This is discussed on the brain MRI.

## 2015-05-07 NOTE — Telephone Encounter (Signed)
Spoke to [place] - agreeable to tizanidine.

## 2015-05-17 ENCOUNTER — Telehealth: Payer: Self-pay | Admitting: Neurology

## 2015-05-17 NOTE — Telephone Encounter (Signed)
Patient called and requested to speak with the nurse regarding some medication he is taking Rx. tizanidine (ZANAFLEX) 2 MG capsule. He is having some issues with the medication and would like to see if he can take something else. Please call and advise.

## 2015-05-17 NOTE — Telephone Encounter (Signed)
Feels tizanidine 2mg  is not helpful even at three times daily.  He would like to try an alternate medication.  He is aware this will be addressed when Dr. Krista Blue returns to the office on 05/21/15.  Says he is fine to wait.

## 2015-05-20 NOTE — Telephone Encounter (Signed)
He has appt in August 1st, will review medication with him at visit.

## 2015-05-21 NOTE — Telephone Encounter (Signed)
Spoke to [place] - he is agreeable to this plan and will keep his 05/28/15 appt.

## 2015-05-28 ENCOUNTER — Ambulatory Visit (INDEPENDENT_AMBULATORY_CARE_PROVIDER_SITE_OTHER): Payer: Medicare Other | Admitting: Neurology

## 2015-05-28 ENCOUNTER — Encounter: Payer: Self-pay | Admitting: Neurology

## 2015-05-28 VITALS — BP 128/74 | HR 91 | Ht 67.0 in | Wt 147.0 lb

## 2015-05-28 DIAGNOSIS — G8220 Paraplegia, unspecified: Secondary | ICD-10-CM | POA: Diagnosis not present

## 2015-05-28 DIAGNOSIS — G249 Dystonia, unspecified: Secondary | ICD-10-CM | POA: Diagnosis not present

## 2015-05-28 DIAGNOSIS — R471 Dysarthria and anarthria: Secondary | ICD-10-CM

## 2015-05-28 DIAGNOSIS — R269 Unspecified abnormalities of gait and mobility: Secondary | ICD-10-CM

## 2015-05-28 MED ORDER — BACLOFEN 10 MG PO TABS: 10.0000 mg | ORAL_TABLET | Freq: Three times a day (TID) | ORAL | Status: DC

## 2015-05-28 MED ORDER — CLONAZEPAM 0.5 MG PO TABS: ORAL_TABLET | ORAL | Status: DC

## 2015-05-28 NOTE — Progress Notes (Signed)
Chief Complaint  Patient presents with  . Dystonia    He is here with his sister, [name].  He is having more difficulty with muscle spasms.  Feels tizanidine is not helpful and would like to discuss other options.      PATIENT: Patrick Daniel DOB: 1956/05/24  Chief Complaint  Patient presents with  . Dystonia    He is here with his sister, [name].  He is having more difficulty with muscle spasms.  Feels tizanidine is not helpful and would like to discuss other options.    HISTORICAL  Patrick Daniel return to evaluation for his continued gait difficulty   [name] is a 59 year old right-handed Caucasian male.  He has past medical history of hyperlipidemia, depression, He presented with gradual onset language difficulty, gait difficulty since 2009.   He was evaluated by [facility] neurologist at [facility], reported normal MRI of brain, he was diagnosed with cervical dystonia, was given Botox injection of the cervical muscles, which has improved his symptoms temporarily.  He moved back to the [name] in April 2012, began to seek medical attention, initially evaluated by [name], reported again normal MRI of brain, also had trial of Haldol and baclofen without much improvement.   Sinemet did not help him. Artane and baclofen provide no help either. 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  He was evaluated by local neurosurgeon [name] at [facility], Because concomitant central nervous system degenerative disorder, cervical dystonia, he is hesitate to perform decompression surgery. I later referred him to [facility] neurology, evaluated by [name], who has suggested Botox injection to alleviate his cervical dystonia symptoms first, may consider  cervical decompression surgery later. Also, likely there is a component of central nervous system degenerative disorder, such as spinocerebellar ataxia,.   He has significant dysarthria, also dysphagia, gait difficulty, frequent falling, he denies sensory changes.  Normal or negative CMP, PEP, CBC, HIV, Lyme disease, SSA/B, TSH, ACE, RPR, CRP, CPK.  He received first EMG guided Botox injection since March 2012, he reported  great improvement, he has been receiving BOTOX injection every 3 months, there is significant improvement of his abnormal neck postural, but still with severe retrocollis, left turn, right tilt.  he began to have moderate improvement after repeat injection.  he was evaluated by his neurosurgeon [name] at [facility] repeat MRI cervical showed advanced multilevel degenerative changes of the cervical spine, most pronounced at C5-C6, where there is severe central stenosis, impression on the cord with focal myelomalacia and severe bilateral neuroforaminal narrowing. Overall appearance is similar to prior study from 09/18/2010. He had posterior decompression  and fusion surgery in December 2013, recovering well, but complains of neck muscle spasm, and chest muscle spasm continued spastic gait difficulty, occasional urinary incontinence, his posture is much improved, but has limited range of motion of his neck  UPDATE June 27th 2016: He had EMG guided BOTOX injection by me since March 2012, most recent EMG guided Botox injection was by [name] April 10 2015,300 units of Botox A  Today he came in for neurological evaluation for continued unsteady gait, difficulty finding a comfortable position, urinary urgency, he denies constipation, denies upper extremity paresthesia or weakness, slow worsening dysarthria,  Overall, is continued slow progression compared to his baseline,  UPDATE August 1st 2016:  He is with his sister at today's clinical visit, he continue complains  significant gait difficulty, slow worsening dysarthria, abnormal neck posturing, tendency to turn towards the left side,  We have reviewed MRI of the brain, he has out generalized atrophy, prominent superior vermis atrophy, out of proportion to other part of the brain, MRI of cervical spine, posterior surgical change at C4, C6, and C2-T2 posterior fusion, metal artifact, multilevel degenerative changes, most significant at C5-6, causing mild canal stenosis, no evidence of cord compression  ROS: Neck pain, neck stiffness, memory loss, numbness, speech difficulty   HOME MEDICATIONS: Current Outpatient Prescriptions  Medication Sig Dispense Refill  . botulinum toxin Type A (BOTOX) 100 UNITS SOLR injection Inject into the muscle.    . clonazePAM (KLONOPIN) 0.5 MG tablet 1- 2 tabs qhs for insomnia 50 tablet 5  . loratadine (CLARITIN) 10 MG tablet Take 10 mg by mouth daily as needed for allergies.    . Misc Natural Products (OSTEO BI-FLEX JOINT SHIELD PO) Take by mouth.    . mometasone (ELOCON) 0.1 % cream Apply 1 application topically daily.    . Multiple Vitamins tablet Take 1 tablet by mouth.    . traMADol (ULTRAM) 50 MG tablet Take 1 tablet (50 mg total) by mouth 3 (three) times daily. 90 tablet 3   No current facility-administered medications for this visit.    PAST MEDICAL HISTORY: Past Medical History  Diagnosis Date  . Paraplegia   . Myelopathy   . Gait disturbance   . Dysarthria   . Dystonia     PAST SURGICAL HISTORY: Past Surgical History  Procedure Laterality Date  . Neck surgery      dec 2014    FAMILY HISTORY: Family History  Problem Relation Age of Onset  . High Cholesterol Sister   . High blood pressure Sister   . Diabetes Sister   . High blood pressure Sister   . Heart disease Mother   . Cancer Mother   . Heart Problems Father     SOCIAL HISTORY:  History   Social History  . Marital Status: Single    Spouse Name: N/A  . Number of Children: 1  .  Years of Education: N/A   Occupational History  . retired     Therapist, art   Social History Main Topics  . Smoking status: Former Smoker    Types: Cigarettes    Quit date: 02/01/2013  . Smokeless tobacco: Never Used     Comment: quit   . Alcohol Use: No  . Drug Use: No  . Sexual Activity: Not on file   Other Topics Concern  . Not on file   Social History Narrative   Patient lives in independent living [place]. Patient is retired from [employer]. Patient has one child. Right handed. Patient is divorced.   Caffeine- 12 - Coffee daily - one daily -soda     PHYSICAL EXAM   Filed Vitals:   05/28/15 1340  BP: 128/74  Pulse: 91  Height: 5\' 7"  (1.702 m)  Weight: 147 lb (66.679 kg)    Not recorded      Body mass index is 23.02 kg/(m^2).  PHYSICAL EXAMNIATION:  Gen: NAD, conversant, well nourised, obese, well groomed                     Cardiovascular: Regular rate rhythm, no peripheral edema, warm, nontender. Eyes: Conjunctivae clear without exudates or hemorrhage Neck: Supple, no carotid bruise. Pulmonary: Clear to  auscultation bilaterally   NEUROLOGICAL EXAM:  MENTAL STATUS: Speech: Slow spastic dysarthria Cognition:    The patient is oriented to person, place, and time;     recent and remote memory intact;     language fluent;     normal attention, concentration,     fund of knowledge.  He has severe limited range of motion of his neck, difficulty turning to right side, mild retrocollis,  mild to moderate rotatation to left side, mild right shoulder elevation.  slow spastic speech.  CRANIAL NERVES: CN II: Visual fields are full to confrontation. Fundoscopic exam is normal with sharp discs and no vascular changes. Pupils were equal round reactive to light CN III, IV, VI: extraocular movement are normal. No ptosis. CN V: Facial sensation is intact to pinprick in all 3 divisions bilaterally. Corneal responses are intact.  CN VII: Face is symmetric with normal eye  closure and smile. CN VIII: Hearing is normal to rubbing fingers CN IX, X: Palate elevates symmetrically. Phonation is normal. CN XI: Head turning and shoulder shrug are intact CN XII: Tongue is midline with normal movements and no atrophy.  MOTOR: Bilateral upper extremity normal tone bulk and strength, he has moderate bilateral lower extremity spasticity, no significant weakness  REFLEXES: Reflexes are 3 and symmetric at the biceps, triceps, knees, and ankles. Plantar responses are extensor bilaterally .  SENSORY: Light touch, pinprick, position sense, and vibration sense are intact in fingers and toes.  COORDINATION: Rapid alternating movements and fine finger movements are intact. There is no dysmetria on finger-to-nose and heel-knee-shin. There are no abnormal or extraneous movements.   GAIT/STANCE: Need to push up to get up from seated position, narrow based, stiff, unsteady gait   DIAGNOSTIC DATA (LABS, IMAGING, TESTING) - I reviewed patient records, labs, notes, testing and imaging myself where available.  ASSESSMENT AND PLAN  Tramayne Sebesta is a 59 y.o. 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 Ph.D.  [facility] [address], [place], [place] [address] Ph: [phone] Fax: [phone]

## 2015-06-28 ENCOUNTER — Telehealth: Payer: Self-pay | Admitting: *Deleted

## 2015-06-28 ENCOUNTER — Telehealth: Payer: Self-pay | Admitting: Neurology

## 2015-06-28 NOTE — Telephone Encounter (Signed)
Yes, It is ok to try to increase BOTOX from 300 units to 400 units at next injection to see if he can benefit from higher dose of BOTOX injections.  We used higher dose up to 400 units in the past. He was able to tolerate it. I will forward the note to Dr. Read Drivers.

## 2015-06-28 NOTE — Telephone Encounter (Signed)
Patrick Daniel is asking for increased botox.  He saw Dr Krista Blue yesterday and would like to have this determined by Dr Letta Pate before his September appt.

## 2015-06-28 NOTE — Telephone Encounter (Signed)
Spoke to [place] - he would like to discontinue use of baclofen because he feels it is not helpful.  He would also like his Botox increased from 300 units to 400 units because the increased dose has been more beneficial in the past.  If you are in agreement with the increase, he has asked [place] to contact Dr. Letta Pate office on his behalf so the he will be able to get the 400 at his pending appt on 07/12/15.

## 2015-06-28 NOTE — Telephone Encounter (Signed)
Patient called with questions about Botox

## 2015-06-28 NOTE — Telephone Encounter (Signed)
May increase 400 units of BOTOX A were injected under EMG guidance.  15615  Right sternocleidomastoid 50 units>75U  Right inferior oblique capitis 25 Left inferior oblique capitis 25  left splenius capitis 25 x3-4= 75>100U Left splenius cervix 25 x2 =50 units  Left longissimus 25 units>50U  Left semispinalis 25 >50U  Right levator scapular 25

## 2015-06-29 NOTE — Telephone Encounter (Signed)
Mr Asbridge notified of the change.

## 2015-07-03 NOTE — Telephone Encounter (Signed)
Called Patrick Daniel to let him know that Dr. Krista Blue has forward her recommendations to Dr. Read Drivers.

## 2015-07-12 ENCOUNTER — Encounter: Payer: Medicare Other | Attending: Physical Medicine & Rehabilitation

## 2015-07-12 ENCOUNTER — Encounter: Payer: Self-pay | Admitting: Physical Medicine & Rehabilitation

## 2015-07-12 ENCOUNTER — Ambulatory Visit (HOSPITAL_BASED_OUTPATIENT_CLINIC_OR_DEPARTMENT_OTHER): Payer: Medicare Other | Admitting: Physical Medicine & Rehabilitation

## 2015-07-12 VITALS — BP 114/70 | HR 87

## 2015-07-12 DIAGNOSIS — G243 Spasmodic torticollis: Secondary | ICD-10-CM

## 2015-07-12 NOTE — Progress Notes (Signed)
Dilution: 50 Units/ml Indication: Severe spasticity which interferes with ADL,mobility and/or  hygiene and is unresponsive to medication management and other conservative care Informed consent was obtained after describing risks and benefits of the procedure with the patient. This includes bleeding, bruising, infection, excessive weakness, or medication side effects. A REMS form is on file and signed. Needle: 27g 1 " needle electrode Number of units per muscle 350 units of BOTOX A were injected under EMG guidance.  Right  sternocleidomastoid 75 units  Right inferior oblique capitis 0 Left inferior oblique capitis 25    left splenius capitis 25 x3= 75 Left splenius cervix 25 x2 =50 units  Left longissimus 25 units    Left semispinalis 0 Left platysma 25  Right levator scapular 0 R trap 50 L upper trap 25 All injections were done after obtaining appropriate EMG activity and after negative drawback for blood. The patient tolerated the procedure well. Post procedure instructions were given. A followup appointment was made.   Next visit consider changing treatment,increasing Right trap to 75U depending on results

## 2015-07-12 NOTE — Patient Instructions (Signed)
May increase dose to Right trap next visit

## 2015-08-28 ENCOUNTER — Encounter: Payer: Self-pay | Admitting: Nurse Practitioner

## 2015-08-28 ENCOUNTER — Ambulatory Visit (INDEPENDENT_AMBULATORY_CARE_PROVIDER_SITE_OTHER): Payer: Medicare Other | Admitting: Nurse Practitioner

## 2015-08-28 VITALS — BP 121/85 | HR 102 | Ht 66.0 in | Wt 143.8 lb

## 2015-08-28 DIAGNOSIS — R269 Unspecified abnormalities of gait and mobility: Secondary | ICD-10-CM

## 2015-08-28 DIAGNOSIS — G243 Spasmodic torticollis: Secondary | ICD-10-CM

## 2015-08-28 DIAGNOSIS — G8220 Paraplegia, unspecified: Secondary | ICD-10-CM | POA: Diagnosis not present

## 2015-08-28 DIAGNOSIS — G959 Disease of spinal cord, unspecified: Secondary | ICD-10-CM | POA: Diagnosis not present

## 2015-08-28 DIAGNOSIS — R471 Dysarthria and anarthria: Secondary | ICD-10-CM | POA: Diagnosis not present

## 2015-08-28 NOTE — Progress Notes (Signed)
[facility]  PATIENT: Patrick Daniel DOB: 1956-01-28   REASON FOR VISIT: Follow-up for dystonia, paraplegia, gait disturbance, dysarthria HISTORY FROM: Patient    HISTORY OF PRESENT ILLNESS: HISTORY: Patrick Daniel return to evaluation for his continued gait difficulty  Patrick Daniel is a 59 year old right-handed Caucasian male. He has past medical history of hyperlipidemia, depression, He presented with gradual onset language difficulty, gait difficulty since 2009. He was evaluated by [facility] neurologist at [facility], reported normal MRI of brain, he was diagnosed with cervical dystonia, was given Botox injection of the cervical muscles, which has improved his symptoms temporarily.  He moved back to the [name] in April 2012, began to seek medical attention, initially evaluated by [name], reported again normal MRI of brain, also had trial of Haldol and baclofen without much improvement.  Sinemet did not help him. Artane and baclofen provide no help either. 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 He was evaluated by local neurosurgeon [name] at [facility], Because concomitant central nervous system degenerative disorder, cervical dystonia, he is hesitate to perform decompression surgery. I later referred him to [facility] neurology, evaluated by [name], who has suggested Botox injection to alleviate his cervical dystonia symptoms first, may consider cervical decompression surgery later. Also, likely there is a component of central nervous system degenerative disorder, such as spinocerebellar ataxia,.  He has significant dysarthria, also dysphagia, gait difficulty, frequent falling, he denies sensory changes. Normal or  negative CMP, PEP, CBC, HIV, Lyme disease, SSA/B, TSH, ACE, RPR, CRP, CPK. He received first EMG guided Botox injection since March 2012, he reported great improvement, he has been receiving BOTOX injection every 3 months, there is significant improvement of his abnormal neck postural, but still with severe retrocollis, left turn, right tilt. he began to have moderate improvement after repeat injection. He was evaluated by his neurosurgeon [name] at [facility], repeat MRI cervical showed advanced multilevel degenerative changes of the cervical spine, most pronounced at C5-C6, where there is severe central stenosis, impression on the cord with focal myelomalacia and severe bilateral neuroforaminal narrowing. Overall appearance is similar to prior study from 09/18/2010. He had posterior decompression and fusion surgery in December 2013, recovering well, but complains of neck muscle spasm, and chest muscle spasm continued spastic gait difficulty, occasional urinary incontinence, his posture is much improved, but has limited range of motion of his neck  UPDATE June 27th 2016:YYHe had EMG guided BOTOX injection by me since March 2012, most recent EMG guided Botox injection was by [name] April 10 2015,300 units of Botox A  Today he came in for neurological evaluation for continued unsteady gait, difficulty finding a comfortable position, urinary urgency, he denies constipation, denies upper extremity paresthesia or weakness, slow worsening dysarthria, Overall, is continued slow progression compared to his baseline,  UPDATE August 1st 2016: He is with his sister at today's clinical visit, he continue complains significant gait difficulty, slow worsening dysarthria, abnormal neck posturing, tendency to turn towards the left side,  We have reviewed MRI of the brain, he has out generalized atrophy, prominent superior vermis atrophy, out of proportion to other part of the brain, MRI of cervical spine,  posterior surgical change at C4, C6, and C2-T2 posterior fusion, metal artifact, multilevel degenerative changes, most significant at C5-6, causing mild  canal stenosis, no evidence of cord compression UPDATE 08/28/2015 [name], 59 year old returns for follow-up. He has a history of cervical dystonia gait disturbance and dysarthria. He has been unresponsive to medical management in the past. He stopped his baclofen recently. He is continuing to get Botox 350 units at his last visit with Dr. Letta Pate on 07/12/2015. He is ambulating with a quad  cane. He returns for reevaluation   REVIEW OF SYSTEMS: Full 14 system review of systems performed and notable only for those listed, all others are neg:  Constitutional: neg  Cardiovascular: neg Ear/Nose/Throat: neg  Skin: neg Eyes: neg Respiratory: neg Gastroitestinal: neg  Hematology/Lymphatic: neg  Endocrine: neg Musculoskeletal: Neck Stiffness Allergy/Immunology: neg Neurological: Speech difficulty Psychiatric: neg Sleep : neg   ALLERGIES: No Known Allergies  HOME MEDICATIONS: Outpatient Prescriptions Prior to Visit  Medication Sig Dispense Refill  . baclofen (LIORESAL) 10 MG tablet Take 1 tablet (10 mg total) by mouth 3 (three) times daily. 90 each 5  . botulinum toxin Type A (BOTOX) 100 UNITS SOLR injection Inject into the muscle.    . clonazePAM (KLONOPIN) 0.5 MG tablet 1- 2 tabs qhs for insomnia 50 tablet 5  . loratadine (CLARITIN) 10 MG tablet Take 10 mg by mouth daily as needed for allergies.    . Misc Natural Products (OSTEO BI-FLEX JOINT SHIELD PO) Take by mouth.    . mometasone (ELOCON) 0.1 % cream Apply 1 application topically daily.    . Multiple Vitamins tablet Take 1 tablet by mouth.    . traMADol (ULTRAM) 50 MG tablet Take 1 tablet (50 mg total) by mouth 3 (three) times daily. 90 tablet 3   No facility-administered medications prior to visit.    PAST MEDICAL HISTORY: Past Medical History  Diagnosis Date  .  Paraplegia ([place])   . Myelopathy ([place])   . Gait disturbance   . Dysarthria   . Dystonia     PAST SURGICAL HISTORY: Past Surgical History  Procedure Laterality Date  . Neck surgery      dec 2014    FAMILY HISTORY: Family History  Problem Relation Age of Onset  . High Cholesterol Sister   . High blood pressure Sister   . Diabetes Sister   . High blood pressure Sister   . Heart disease Mother   . Cancer Mother   . Heart Problems Father     SOCIAL HISTORY: Social History   Social History  . Marital Status: Single    Spouse Name: N/A  . Number of Children: 1  . Years of Education: N/A   Occupational History  . retired     Therapist, art   Social History Main Topics  . Smoking status: Former Smoker    Types: Cigarettes    Quit date: 02/01/2013  . Smokeless tobacco: Never Used     Comment: quit   . Alcohol Use: No  . Drug Use: No  . Sexual Activity: Not on file   Other Topics Concern  . Not on file   Social History Narrative   Patient lives in independent living [place]. Patient is retired from [employer]. Patient has one child. Right handed. Patient is divorced.   Caffeine- 12 - Coffee daily - one daily -soda     PHYSICAL EXAM  Filed Vitals:   08/28/15 1424  BP: 121/85  Pulse: 102  Height: 5\' 6"  (1.676 m)  Weight: 143 lb 12.8 oz (65.227 kg)   Body mass index is 23.22 kg/(m^2).  Generalized: Well  developed, in no acute distress  Head: normocephalic and atraumatic,. Oropharynx benign  Neck: Severe limited range of motion to the neck, difficulty turning to the right side mouth right shoulder elevation Cardiac: Regular rate rhythm, no murmur  Musculoskeletal: No deformity   Neurological examination   Mentation: Alert oriented to time, place, history taking. Attention span and concentration appropriate. Recent and remote memory intact.  Follows all commands speech dysarthria .   Cranial nerve II-XII: Fundoscopic exam reveals sharp disc margins.Pupils were  equal round reactive to light extraocular movements were full, visual field were full on confrontational test. Facial sensation and strength were normal. hearing was intact to finger rubbing bilaterally. Uvula tongue midline. head turning and shoulder shrug were normal and symmetric.Tongue protrusion into cheek strength was normal. Motor: normal bulk and tone, full strength in the BUE, he has moderate bilateral lower extremity spasticity  No focal weakness Sensory: normal and symmetric to light touch, pinprick, and  Vibration,  Coordination: finger-nose-finger, heel-to-shin bilaterally, no dysmetria Reflexes: 3+ upper lower and symmetric,  plantar responses were extensor bilaterally. Gait and Station: Rising up from seated position with pushoff , narrow based stiff unsteady gait with quad cane  Tandem gait was not attempted  DIAGNOSTIC DATA (LABS, IMAGING, TESTING) - ASSESSMENT AND PLAN  59 y.o. year old male  has a past medical history of Paraplegia ([place]); Myelopathy ([place]); Gait disturbance; Dysarthria; and Dystonia. here  to follow up.   Will continue his EMG guided Botox with Dr. Letta Pate  Continue to use quad cane at all times for gait abnormality fall prevention Copy of MRI brain and cervical spine given to patient per request Follow-up in 6-8 months Dennie Bible, [facility], [facility], [facility] [address], [place], [place] [address] [phone]

## 2015-08-28 NOTE — Patient Instructions (Signed)
Continue receiving Botox for dystonia F/U in   8 months

## 2015-08-28 NOTE — Progress Notes (Signed)
I have reviewed and agreed above plan. 

## 2015-08-29 DIAGNOSIS — J302 Other seasonal allergic rhinitis: Secondary | ICD-10-CM | POA: Diagnosis not present

## 2015-08-29 DIAGNOSIS — Z6828 Body mass index (BMI) 28.0-28.9, adult: Secondary | ICD-10-CM | POA: Diagnosis not present

## 2015-08-30 ENCOUNTER — Other Ambulatory Visit: Payer: Self-pay | Admitting: Neurology

## 2015-08-30 MED ORDER — TRAMADOL HCL 50 MG PO TABS: 50.0000 mg | ORAL_TABLET | Freq: Three times a day (TID) | ORAL | Status: DC

## 2015-08-30 NOTE — Telephone Encounter (Signed)
Pt called requesting refill for traMADol (ULTRAM) 50 MG tablet .

## 2015-08-31 NOTE — Telephone Encounter (Signed)
Provider has approved request

## 2015-09-03 ENCOUNTER — Telehealth: Payer: Self-pay | Admitting: *Deleted

## 2015-09-03 NOTE — Telephone Encounter (Signed)
Rx for Tramadol faxed and confirmed to pharmacy at [phone].

## 2015-09-04 DIAGNOSIS — H40003 Preglaucoma, unspecified, bilateral: Secondary | ICD-10-CM | POA: Diagnosis not present

## 2015-09-04 DIAGNOSIS — H2513 Age-related nuclear cataract, bilateral: Secondary | ICD-10-CM | POA: Diagnosis not present

## 2015-10-26 ENCOUNTER — Ambulatory Visit: Payer: Medicare Other | Admitting: Physical Medicine & Rehabilitation

## 2015-10-26 ENCOUNTER — Ambulatory Visit (HOSPITAL_BASED_OUTPATIENT_CLINIC_OR_DEPARTMENT_OTHER): Payer: Medicare Other | Admitting: Physical Medicine & Rehabilitation

## 2015-10-26 ENCOUNTER — Encounter: Payer: Medicare Other | Attending: Physical Medicine & Rehabilitation

## 2015-10-26 ENCOUNTER — Encounter: Payer: Self-pay | Admitting: Physical Medicine & Rehabilitation

## 2015-10-26 VITALS — BP 117/75 | HR 88 | Resp 14

## 2015-10-26 DIAGNOSIS — G243 Spasmodic torticollis: Secondary | ICD-10-CM

## 2015-10-26 NOTE — Progress Notes (Signed)
Dilution: 50 Units/ml Indication: Severe spasticity which interferes with ADL,mobility and/or  hygiene and is unresponsive to medication management and other conservative care Informed consent was obtained after describing risks and benefits of the procedure with the patient. This includes bleeding, bruising, infection, excessive weakness, or medication side effects. A REMS form is on file and signed. Needle: 27g 1 " needle electrode Number of units per muscle 350 units of BOTOX A were injected under EMG guidance.  Right  sternocleidomastoid 75 units  Right inferior oblique capitis 0 Left inferior oblique capitis 25    left splenius capitis 25 x3= 75 Left splenius cervix 25 x2 =50 units  Left longissimus 25 units    Left semispinalis 0 Left platysma 25  Right levator scapular 0 R trap 50 L upper trap 25 All injections were done after obtaining appropriate EMG activity and after negative drawback for blood. The patient tolerated the procedure well. Post procedure instructions were given. A followup appointment was made.   Next visit consider increasing Left trapezius to 50, left splenius capitis to 75, reduce right trapezius to 50, consider increase concentration to 100 units per mL

## 2015-10-26 NOTE — Patient Instructions (Signed)

## 2015-11-28 DIAGNOSIS — Z0000 Encounter for general adult medical examination without abnormal findings: Secondary | ICD-10-CM | POA: Diagnosis not present

## 2015-11-28 DIAGNOSIS — Z6823 Body mass index (BMI) 23.0-23.9, adult: Secondary | ICD-10-CM | POA: Diagnosis not present

## 2015-11-28 DIAGNOSIS — Z1211 Encounter for screening for malignant neoplasm of colon: Secondary | ICD-10-CM | POA: Diagnosis not present

## 2015-11-28 DIAGNOSIS — D2330 Other benign neoplasm of skin of unspecified part of face: Secondary | ICD-10-CM | POA: Diagnosis not present

## 2015-12-20 ENCOUNTER — Telehealth: Payer: Self-pay | Admitting: Neurology

## 2015-12-20 DIAGNOSIS — D2239 Melanocytic nevi of other parts of face: Secondary | ICD-10-CM | POA: Diagnosis not present

## 2015-12-20 DIAGNOSIS — C44319 Basal cell carcinoma of skin of other parts of face: Secondary | ICD-10-CM | POA: Diagnosis not present

## 2015-12-20 MED ORDER — CLONAZEPAM 0.5 MG PO TABS: ORAL_TABLET | ORAL | Status: DC

## 2015-12-20 NOTE — Telephone Encounter (Signed)
Pt called requesting refill for clonazePAM (KLONOPIN) 0.5 MG tablet . Please send to [facility] [address] Tia Alert  (p) [phone]. He will run out on Wednesday or Thursday (week of 12/14/15)

## 2015-12-20 NOTE — Telephone Encounter (Signed)
Rx refill appropriate and sent to the requested pharmacy.

## 2016-01-10 DIAGNOSIS — Z0000 Encounter for general adult medical examination without abnormal findings: Secondary | ICD-10-CM | POA: Diagnosis not present

## 2016-01-10 DIAGNOSIS — Z139 Encounter for screening, unspecified: Secondary | ICD-10-CM | POA: Diagnosis not present

## 2016-01-10 DIAGNOSIS — Z1389 Encounter for screening for other disorder: Secondary | ICD-10-CM | POA: Diagnosis not present

## 2016-01-16 DIAGNOSIS — E785 Hyperlipidemia, unspecified: Secondary | ICD-10-CM | POA: Diagnosis not present

## 2016-01-16 DIAGNOSIS — Z131 Encounter for screening for diabetes mellitus: Secondary | ICD-10-CM | POA: Diagnosis not present

## 2016-01-21 ENCOUNTER — Ambulatory Visit (HOSPITAL_BASED_OUTPATIENT_CLINIC_OR_DEPARTMENT_OTHER): Payer: Medicare Other | Admitting: Physical Medicine & Rehabilitation

## 2016-01-21 ENCOUNTER — Encounter: Payer: Self-pay | Admitting: Physical Medicine & Rehabilitation

## 2016-01-21 ENCOUNTER — Encounter: Payer: Medicare Other | Attending: Physical Medicine & Rehabilitation

## 2016-01-21 VITALS — BP 119/70 | HR 85 | Resp 14

## 2016-01-21 DIAGNOSIS — G243 Spasmodic torticollis: Secondary | ICD-10-CM | POA: Diagnosis not present

## 2016-01-21 DIAGNOSIS — G248 Other dystonia: Secondary | ICD-10-CM | POA: Insufficient documentation

## 2016-01-21 NOTE — Patient Instructions (Signed)
Will reduce botox to 300 U next visit

## 2016-01-21 NOTE — Progress Notes (Signed)
Botox Injection for Cervical dystonia using needle EMG guidance  Dilution: 50 Units/ml Indication: Severe Cervical dystonia which interferes with ADL,mobility and Causes neck pain and is unresponsive to medication management and other conservative care Informed consent was obtained after describing risks and benefits of the procedure with the patient. This includes bleeding, bruising, infection, excessive weakness, or medication side effects. A REMS form is on file and signed. Needle: 27g 1" needle electrode Number of units per muscle Right  sternocleidomastoid 75 units  Right inferior oblique capitis 0 Left inferior oblique capitis 25    left splenius capitis 25 x3= 75 Left splenius cervix 25 x2 =50 units  Left longissimus 25 units    Left semispinalis 0 Left platysma 25  Right levator scapular 0 R trap 50 L upper trap 25   All injections were done after obtaining appropriate EMG activity and after negative drawback for blood. The patient tolerated the procedure well. Post procedure instructions were given. A followup appointment was made.

## 2016-03-12 DIAGNOSIS — H524 Presbyopia: Secondary | ICD-10-CM | POA: Diagnosis not present

## 2016-03-12 DIAGNOSIS — H40003 Preglaucoma, unspecified, bilateral: Secondary | ICD-10-CM | POA: Diagnosis not present

## 2016-03-12 DIAGNOSIS — H2513 Age-related nuclear cataract, bilateral: Secondary | ICD-10-CM | POA: Diagnosis not present

## 2016-04-22 ENCOUNTER — Ambulatory Visit (HOSPITAL_BASED_OUTPATIENT_CLINIC_OR_DEPARTMENT_OTHER): Payer: Medicare Other | Admitting: Physical Medicine & Rehabilitation

## 2016-04-22 ENCOUNTER — Encounter: Payer: Medicare Other | Attending: Physical Medicine & Rehabilitation

## 2016-04-22 ENCOUNTER — Encounter: Payer: Self-pay | Admitting: Physical Medicine & Rehabilitation

## 2016-04-22 VITALS — BP 119/78 | HR 100 | Resp 17

## 2016-04-22 DIAGNOSIS — G243 Spasmodic torticollis: Secondary | ICD-10-CM | POA: Insufficient documentation

## 2016-04-22 NOTE — Patient Instructions (Signed)

## 2016-04-22 NOTE — Progress Notes (Signed)
   Subjective:    Patient ID: Patrick Daniel, male    DOB: [birth year], 58 y.o.   MRN: [id number]  HPI     Review of Systems      Objective:   Physical Exam   Muscle activity noted in the right sternocleidomastoid, left splenius capitis, left obliquis capitus, left longissimus on EMG during the procedure.      Assessment & Plan:  Botox Injection for spasticity using needle EMG guidance  Dilution: 50 Units/ml Indication: Severe spasticity which interferes with ADL,mobility and/or  hygiene and is unresponsive to medication management and other conservative care Informed consent was obtained after describing risks and benefits of the procedure with the patient. This includes bleeding, bruising, infection, excessive weakness, or medication side effects. A REMS form is on file and signed. Needle: 27g 1 " needle electrode Number of units per muscle 300 units of BOTOX A were injected under EMG guidance.  Right sternocleidomastoid 62.5 units  Right inferior oblique capitis 25 Left inferior oblique capitis 25  left splenius capitis 25 x3= 75 Left splenius cervix 25 x2 =50 units  Left longissimus 25 units  Left semispinalis 25   Left platysma 12.5U  Right levator scapular 25 All injections were done after obtaining appropriate EMG activity and after negative drawback for blood. The patient tolerated the procedure well. Post procedure instructions were given. A followup appointment was made.   Next visit consider changing treatment,No EMG activity was noted in the left semispinalis capitis and the right levator scapula as well as right the inferior oblique  Capitis muscle

## 2016-04-28 ENCOUNTER — Ambulatory Visit (INDEPENDENT_AMBULATORY_CARE_PROVIDER_SITE_OTHER): Payer: Medicare Other | Admitting: Nurse Practitioner

## 2016-04-28 ENCOUNTER — Encounter: Payer: Self-pay | Admitting: Nurse Practitioner

## 2016-04-28 VITALS — BP 124/76 | HR 90 | Ht 66.0 in | Wt 136.8 lb

## 2016-04-28 DIAGNOSIS — R269 Unspecified abnormalities of gait and mobility: Secondary | ICD-10-CM

## 2016-04-28 DIAGNOSIS — R471 Dysarthria and anarthria: Secondary | ICD-10-CM | POA: Diagnosis not present

## 2016-04-28 DIAGNOSIS — G243 Spasmodic torticollis: Secondary | ICD-10-CM | POA: Diagnosis not present

## 2016-04-28 DIAGNOSIS — G8220 Paraplegia, unspecified: Secondary | ICD-10-CM | POA: Diagnosis not present

## 2016-04-28 DIAGNOSIS — G959 Disease of spinal cord, unspecified: Secondary | ICD-10-CM | POA: Diagnosis not present

## 2016-04-28 MED ORDER — TRAMADOL HCL 50 MG PO TABS: 50.0000 mg | ORAL_TABLET | Freq: Three times a day (TID) | ORAL | Status: DC

## 2016-04-28 MED ORDER — CLONAZEPAM 0.5 MG PO TABS: ORAL_TABLET | ORAL | Status: DC

## 2016-04-28 NOTE — Progress Notes (Signed)
Received fax confirmation for tramadol and klonopin [phone].

## 2016-04-28 NOTE — Progress Notes (Signed)
[facility]  PATIENT: Patrick Daniel DOB: 09-27-1956   REASON FOR VISIT: Follow-up for paraplegia, cervical dystonia gait disturbance, dysarthria HISTORY FROM: Patient    HISTORY OF PRESENT ILLNESS:HISTORY: Patrick Daniel return to evaluation for his continued gait difficulty  Patrick Daniel is a 60 year old right-handed Caucasian male. He has past medical history of hyperlipidemia, depression, He presented with gradual onset language difficulty, gait difficulty since 2009. He was evaluated by [name] neurologist at [name], reported normal MRI of brain, he was diagnosed with cervical dystonia, was given Botox injection of the cervical muscles, which has improved his symptoms temporarily.  He moved back to the [name] in April 2012, began to seek medical attention, initially evaluated by Dr. Metta Daniel, reported again normal MRI of brain, also had trial of Haldol and baclofen without much improvement.  Sinemet did not help him. Artane and baclofen provide no help either. 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 He was evaluated by local neurosurgeon Dr. Granville Daniel at [facility], Because concomitant central nervous system degenerative disorder, cervical dystonia, he is hesitate to perform decompression surgery. I later referred him to [name] neurology, evaluated by Dr. Rush Daniel, who has suggested Botox injection to alleviate his cervical dystonia symptoms first, may consider cervical decompression surgery later. Also, likely there is a component of central nervous system degenerative disorder, such as spinocerebellar ataxia,.  He has significant dysarthria, also dysphagia, gait difficulty, frequent falling, he denies sensory changes. Normal or  negative CMP, PEP, CBC, HIV, Lyme disease, SSA/B, TSH, ACE, RPR, CRP, CPK. He received first EMG guided Botox injection since March 2012, he reported great improvement, he has been receiving BOTOX injection every 3 months, there is significant improvement of his abnormal neck postural, but still with severe retrocollis, left turn, right tilt. he began to have moderate improvement after repeat injection. He was evaluated by his neurosurgeon Dr. Aubery Daniel at [name], repeat MRI cervical showed advanced multilevel degenerative changes of the cervical spine, most pronounced at C5-C6, where there is severe central stenosis, impression on the cord with focal myelomalacia and severe bilateral neuroforaminal narrowing. Overall appearance is similar to prior study from 09/18/2010. He had posterior decompression and fusion surgery in December 2013, recovering well, but complains of neck muscle spasm, and chest muscle spasm continued spastic gait difficulty, occasional urinary incontinence, his posture is much improved, but has limited range of motion of his neck  UPDATE June 27th 2016:YYHe had EMG guided BOTOX injection by me since March 2012, most recent EMG guided Botox injection was by Dr. Wanda Daniel April 10 2015,300 units of Botox A Today he came in for neurological evaluation for continued unsteady gait, difficulty finding a comfortable position, urinary urgency, he denies constipation, denies upper extremity paresthesia or weakness, slow worsening dysarthria, Overall, is continued slow progression compared to his baseline, UPDATE August 1st 2016: He is with his sister at today's clinical visit, he continue complains significant gait difficulty, slow worsening dysarthria, abnormal neck posturing, tendency to turn towards the left side,  We have reviewed MRI of the brain, he has out generalized atrophy, prominent superior vermis atrophy, out of proportion to other part of the brain, MRI of cervical spine,  posterior surgical change at C4, C6, and C2-T2 posterior fusion, metal artifact, multilevel degenerative changes, most significant at C5-6, causing mild canal stenosis,  no evidence of cord compression UPDATE 11/01/2016CM Patrick Daniel, 59 year old returns for follow-up. He has a history of cervical dystonia gait disturbance and dysarthria. He has been unresponsive to medical management in the past. He stopped his baclofen recently. He is continuing to get Botox 350 units at his last visit with Dr. Letta Daniel on 07/12/2015. He is ambulating with a quad cane. He returns for reevaluation UPDATE 07/03/2017CM Patrick Daniel, 60 year old male returns for follow-up. He has a history of cervical dystonia, gait disturbance and dysarthria. He continues to get Botox every 3 months which he finds beneficial. He continues to ambulate with a quad cane, he denies any falls. He does not drive. He has been unresponsive to medical management in the past. His tried baclofen, Artane and Sinemet. He has been evaluated at [facility] and [facility]. He returns for reevaluation  REVIEW OF SYSTEMS: Full 14 system review of systems performed and notable only for those listed, all others are neg:  Constitutional: neg  Cardiovascular: neg Ear/Nose/Throat: neg  Skin: neg Eyes: neg Respiratory: neg Gastroitestinal: neg  Hematology/Lymphatic: neg  Endocrine: neg Musculoskeletal: Walking difficulty, Neck Stiffness Allergy/Immunology: neg Neurological: Speech difficulty Psychiatric: neg Sleep : neg   ALLERGIES: Allergies  Allergen Reactions  . Baclofen     Weakness, drowsiness    HOME MEDICATIONS: Outpatient Prescriptions Prior to Visit  Medication Sig Dispense Refill  . aspirin 325 MG tablet Take by mouth.    . botulinum toxin Type A (BOTOX) 100 UNITS SOLR injection Inject into the muscle.    . clonazePAM (KLONOPIN) 0.5 MG tablet 1- 2 tabs qhs for insomnia 50 tablet 5  . loratadine (CLARITIN) 10 MG  tablet Take 10 mg by mouth daily as needed for allergies.    . mometasone (ELOCON) 0.1 % cream Apply 1 application topically daily as needed.     . Multiple Vitamins tablet Take 1 tablet by mouth.    . traMADol (ULTRAM) 50 MG tablet Take 1 tablet (50 mg total) by mouth 3 (three) times daily. (Patient taking differently: Take 50 mg by mouth 3 (three) times daily. TID prn) 90 tablet 3   No facility-administered medications prior to visit.    PAST MEDICAL HISTORY: Past Medical History  Diagnosis Date  . Paraplegia ([place])   . Myelopathy ([place])   . Gait disturbance   . Dysarthria   . Dystonia     PAST SURGICAL HISTORY: Past Surgical History  Procedure Laterality Date  . Neck surgery      dec 2014    FAMILY HISTORY: Family History  Problem Relation Age of Onset  . High Cholesterol Sister   . High blood pressure Sister   . Diabetes Sister   . High blood pressure Sister   . Heart disease Mother   . Cancer Mother   . Heart Problems Father     SOCIAL HISTORY: Social History   Social History  . Marital Status: Single    Spouse Name: N/A  . Number of Children: 1  . Years of Education: N/A   Occupational History  . retired     Therapist, art   Social History Main Topics  . Smoking status: Former Smoker    Types: Cigarettes    Quit date: 02/01/2013  . Smokeless tobacco: Never Used     Comment: quit   . Alcohol Use: No  . Drug Use: No  . Sexual Activity: Not on file   Other Topics Concern  . Not on file   Social  History Narrative   Patient lives in independent living [place]. Patient is retired from [employer]. Patient has one child. Right handed. Patient is divorced.   Caffeine- 12 - Coffee daily - one daily -soda     PHYSICAL EXAM  Filed Vitals:   04/28/16 1358  BP: 124/76  Pulse: 90  Height: 5\' 6"  (1.676 m)  Weight: 136 lb 12.8 oz (62.052 kg)   Body mass index is 22.09 kg/(m^2). Generalized: Well developed, in no acute distress  Head: normocephalic and  atraumatic,. Oropharynx benign  Neck: Severe limited range of motion to the neck, difficulty turning to the right side mouth right shoulder elevation Cardiac: Regular rate rhythm, no murmur  Musculoskeletal: No deformity   Neurological examination   Mentation: Alert oriented to time, place, history taking. Attention span and concentration appropriate. Recent and remote memory intact. Follows all commands speech dysarthria .   Cranial nerve II-XII: Pupils were equal round reactive to light extraocular movements were full, visual field were full on confrontational test. Facial sensation and strength were normal. hearing was intact to finger rubbing bilaterally. Uvula tongue midline. head turning and shoulder shrug were normal and symmetric.Tongue protrusion into cheek strength was normal. Motor: normal bulk and tone, full strength in the BUE, he has moderate bilateral lower extremity spasticity No focal weakness Sensory: normal and symmetric to light touch, pinprick, and Vibration,  Coordination: finger-nose-finger, heel-to-shin bilaterally, no dysmetria Reflexes: 3+ upper lower and symmetric, plantar responses were extensor bilaterally. Gait and Station: Rising up from seated position with pushoff , narrow based stiff unsteady gait with quad cane Tandem gait was not attempted DIAGNOSTIC DATA (LABS, IMAGING, TESTING) -  ASSESSMENT AND PLAN 60 y.o. year old male  has a past medical history of Paraplegia ([place]); Myelopathy ([place]); Gait disturbance; Dysarthria; and Dystonia. here to follow up.   Will continue his EMG guided Botox with Dr. Letta Daniel  Continue to use quad cane at all times for gait abnormality fall prevention Continue Ultram as needed for pain will refill Continue Klonopin 1-2 tabs every at bedtime for insomnia will refill Follow-up in 8 months next with Dr. Luan Pulling, [facility], [facility], [facility] [address] [place], [place]  [address] [phone]

## 2016-04-28 NOTE — Patient Instructions (Signed)
Will continue his EMG guided Botox with Dr. Letta Pate  Continue to use quad cane at all times for gait abnormality fall prevention Follow-up in 8 months next with Dr. Krista Blue

## 2016-04-28 NOTE — Progress Notes (Signed)
I have reviewed and agreed above plan. 

## 2016-05-19 ENCOUNTER — Telehealth: Payer: Self-pay | Admitting: Neurology

## 2016-05-19 NOTE — Telephone Encounter (Signed)
Pharmacy updated in chart

## 2016-05-19 NOTE — Telephone Encounter (Signed)
Pt called said he hs changed pharmacies, he used to be with [facility]. He is now using [facility]

## 2016-06-03 ENCOUNTER — Encounter: Payer: Medicare Other | Attending: Physical Medicine & Rehabilitation

## 2016-06-03 ENCOUNTER — Ambulatory Visit (HOSPITAL_BASED_OUTPATIENT_CLINIC_OR_DEPARTMENT_OTHER): Payer: Medicare Other | Admitting: Physical Medicine & Rehabilitation

## 2016-06-03 ENCOUNTER — Encounter: Payer: Self-pay | Admitting: Physical Medicine & Rehabilitation

## 2016-06-03 VITALS — BP 122/76 | HR 91

## 2016-06-03 DIAGNOSIS — G243 Spasmodic torticollis: Secondary | ICD-10-CM | POA: Diagnosis not present

## 2016-06-03 DIAGNOSIS — G43709 Chronic migraine without aura, not intractable, without status migrainosus: Secondary | ICD-10-CM | POA: Insufficient documentation

## 2016-06-03 NOTE — Progress Notes (Addendum)
Subjective:    Patient ID: Patrick Daniel, male    DOB: Jul 28, 1956, 60 y.o.   MRN: [id number]  HPI Received Botox injection for cervical dystonia as well as chronic migraine. He points to the lateral aspect of the right mid cervical area as an area of pain. He also feels like his head still turns toward the left side and tilts a little bit toward the left as well. He states that his symptoms exacerbate with stress and has had some family issues going on recently  Pain Inventory Average Pain 0 Pain Right Now 0 My pain is no pain  In the last 24 hours, has pain interfered with the following? General activity 0 Relation with others 0 Enjoyment of life 0 What TIME of day is your pain at its worst? no pain Sleep (in general) Fair  Pain is worse with: no pain Pain improves with: no pain Relief from Meds: no pain  Mobility walk with assistance use a cane ability to climb steps?  yes do you drive?  yes transfers alone  Function retired I need assistance with the following:  shopping  Neuro/Psych numbness tingling trouble walking spasms dizziness  Prior Studies Any changes since last visit?  no  Physicians involved in your care Any changes since last visit?  no   Family History  Problem Relation Age of Onset  . Heart disease Mother   . Cancer Mother   . Heart Problems Father   . High Cholesterol Sister   . High blood pressure Sister   . Diabetes Sister   . High blood pressure Sister    Social History   Social History  . Marital status: Single    Spouse name: N/A  . Number of children: 1  . Years of education: N/A   Occupational History  . retired     Therapist, art   Social History Main Topics  . Smoking status: Former Smoker    Types: Cigarettes    Quit date: 02/01/2013  . Smokeless tobacco: Never Used     Comment: quit   . Alcohol use No  . Drug use: No  . Sexual activity: Not Asked   Other Topics Concern  . None   Social History Narrative   Patient  lives in independent living [place]. Patient is retired from [employer]. Patient has one child. Right handed. Patient is divorced.   Caffeine- 12 - Coffee daily - one daily -soda   Past Surgical History:  Procedure Laterality Date  . NECK SURGERY     dec 2014   Past Medical History:  Diagnosis Date  . Dysarthria   . Dystonia   . Gait disturbance   . Myelopathy ([place])   . Paraplegia ([facility])    BP 122/76 (BP Location: Left Arm, Patient Position: Sitting, Cuff Size: Normal)   Pulse 91   SpO2 95%   Opioid Risk Score:   Fall Risk Score:  `1  Depression screen PHQ 2/9  Depression screen [facility] 2/9 01/21/2016 07/12/2015  Decreased Interest 0 0  Down, Depressed, Hopeless 0 0  PHQ - 2 Score 0 0  Altered sleeping 1 -  Tired, decreased energy 0 -  Change in appetite 0 -  Feeling bad or failure about yourself  0 -  Trouble concentrating 0 -  Moving slowly or fidgety/restless 0 -  Suicidal thoughts 0 -  PHQ-9 Score 1 -    Review of Systems  HENT: Negative.   Eyes: Negative.   Respiratory: Negative.  Cardiovascular: Negative.   Endocrine: Negative.   Genitourinary: Negative.   Musculoskeletal: Positive for gait problem.       Spasms   Allergic/Immunologic: Negative.   Neurological: Positive for dizziness and numbness.       Tingling  Hematological: Negative.        Objective:   Physical Exam  Patient has tenderness over the right splenius capitis area, left trapezius area,      still some prominent tone in the right sternocleidomastoid and intermittently in the left platysma.      Assessment & Plan:  1. Cervical dystonia, overall good response with botulinum toxin injection. He does have prominent tone in the right sternocleidomastoid as well as left platysma  On EMG activity during the last injection. There was no EMG activity in the left semispinalis capitis. The right levator scapula as well as the right inferior oblique capitis muscle  The following changes will  be made for next injection  Increase right sternocleidomastoid to 75 units  We will add right splenius capitis, 25 units     Left inferior oblique capitis 25  left splenius capitis 25 x3= 75 Left splenius cervix 25 x2 =50 units  Left longissimus 25 units    Left platysma 25U

## 2016-06-03 NOTE — Patient Instructions (Addendum)
We will make some adjustments to  Botox injection, the overall dose will remain the same. However

## 2016-07-25 ENCOUNTER — Encounter: Payer: Medicare Other | Attending: Physical Medicine & Rehabilitation

## 2016-07-25 ENCOUNTER — Ambulatory Visit (HOSPITAL_BASED_OUTPATIENT_CLINIC_OR_DEPARTMENT_OTHER): Payer: Medicare Other | Admitting: Physical Medicine & Rehabilitation

## 2016-07-25 ENCOUNTER — Encounter: Payer: Self-pay | Admitting: Physical Medicine & Rehabilitation

## 2016-07-25 VITALS — BP 133/77 | HR 88 | Resp 14

## 2016-07-25 DIAGNOSIS — G243 Spasmodic torticollis: Secondary | ICD-10-CM | POA: Insufficient documentation

## 2016-07-25 NOTE — Progress Notes (Signed)
Botox Injection for spasticity using needle EMG guidance  Dilution: 50 Units/ml Indication: Cervical dystonia which interferes with ADL,mobility and/or  hygiene and is unresponsive to medication management and other conservative care Informed consent was obtained after describing risks and benefits of the procedure with the patient. This includes bleeding, bruising, infection, excessive weakness, or medication side effects. A REMS form is on file and signed. Needle: 30g 67mm needle electrode Number of units per muscle right sternocleidomastoid to 75 units  right splenius capitis, 25 units   Left inferior oblique capitis 25  left splenius capitis 25 x3= 75 Left splenius cervix 25 x2 =50 units Left longissimus 25 units  Left platysma 12.5U x 2, left anterior and left lateral   All injections were done after obtaining appropriate EMG activity and after negative drawback for blood. The patient tolerated the procedure well. Post procedure instructions were given. A followup appointment was made.

## 2016-08-07 ENCOUNTER — Encounter: Payer: Self-pay | Admitting: Neurology

## 2016-08-07 ENCOUNTER — Ambulatory Visit (INDEPENDENT_AMBULATORY_CARE_PROVIDER_SITE_OTHER): Payer: Medicare Other | Admitting: Neurology

## 2016-08-07 VITALS — BP 114/76 | HR 72 | Resp 18 | Ht 66.0 in | Wt 136.0 lb

## 2016-08-07 DIAGNOSIS — R471 Dysarthria and anarthria: Secondary | ICD-10-CM

## 2016-08-07 DIAGNOSIS — R202 Paresthesia of skin: Secondary | ICD-10-CM | POA: Insufficient documentation

## 2016-08-07 DIAGNOSIS — R269 Unspecified abnormalities of gait and mobility: Secondary | ICD-10-CM | POA: Diagnosis not present

## 2016-08-07 DIAGNOSIS — G243 Spasmodic torticollis: Secondary | ICD-10-CM

## 2016-08-07 DIAGNOSIS — G959 Disease of spinal cord, unspecified: Secondary | ICD-10-CM | POA: Diagnosis not present

## 2016-08-07 NOTE — Progress Notes (Signed)
[facility]  PATIENT: Patrick Daniel DOB: 09-27-1956   REASON FOR VISIT: Follow-up for paraplegia, cervical dystonia gait disturbance, dysarthria HISTORY FROM: Patient    HISTORY OF PRESENT ILLNESS:HISTORY: Patrick Daniel return to evaluation for his continued gait difficulty  Patrick Daniel is a 60 year old right-handed Caucasian male. He has past medical history of hyperlipidemia, depression, He presented with gradual onset language difficulty, gait difficulty since 2009. He was evaluated by [name] neurologist at [name], reported normal MRI of brain, he was diagnosed with cervical dystonia, was given Botox injection of the cervical muscles, which has improved his symptoms temporarily.  He moved back to the [name] in April 2012, began to seek medical attention, initially evaluated by [name], reported again normal MRI of brain, also had trial of Haldol and baclofen without much improvement.  Sinemet did not help him. Artane and baclofen provide no help either. 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 He was evaluated by local neurosurgeon [name] at [facility], Because concomitant central nervous system degenerative disorder, cervical dystonia, he is hesitate to perform decompression surgery. I later referred him to [name] neurology, evaluated by [name], who has suggested Botox injection to alleviate his cervical dystonia symptoms first, may consider cervical decompression surgery later. Also, likely there is a component of central nervous system degenerative disorder, such as spinocerebellar ataxia,.  He has significant dysarthria, also dysphagia, gait difficulty, frequent falling, he denies sensory changes. Normal or  negative CMP, PEP, CBC, HIV, Lyme disease, SSA/B, TSH, ACE, RPR, CRP, CPK. He received first EMG guided Botox injection since March 2012, he reported great improvement, he has been receiving BOTOX injection every 3 months, there is significant improvement of his abnormal neck postural, but still with severe retrocollis, left turn, right tilt. he began to have moderate improvement after repeat injection. He was evaluated by his neurosurgeon [name] at [name], repeat MRI cervical showed advanced multilevel degenerative changes of the cervical spine, most pronounced at C5-C6, where there is severe central stenosis, impression on the cord with focal myelomalacia and severe bilateral neuroforaminal narrowing. Overall appearance is similar to prior study from 09/18/2010. He had posterior decompression and fusion surgery in December 2013, recovering well, but complains of neck muscle spasm, and chest muscle spasm continued spastic gait difficulty, occasional urinary incontinence, his posture is much improved, but has limited range of motion of his neck  UPDATE June 27th 2016:YYHe had EMG guided BOTOX injection by me since March 2012, most recent EMG guided Botox injection was by [name] April 10 2015,300 units of Botox A Today he came in for neurological evaluation for continued unsteady gait, difficulty finding a comfortable position, urinary urgency, he denies constipation, denies upper extremity paresthesia or weakness, slow worsening dysarthria, Overall, is continued slow progression compared to his baseline, UPDATE August 1st 2016: He is with his sister at today's clinical visit, he continue complains significant gait difficulty, slow worsening dysarthria, abnormal neck posturing, tendency to turn towards the left side,  We have reviewed MRI of the brain, he has out generalized atrophy, prominent superior vermis atrophy, out of proportion to other part of the brain, MRI of cervical spine,  posterior surgical change at C4, C6, and C2-T2 posterior fusion, metal artifact, multilevel degenerative changes, most significant at C5-6, causing mild canal stenosis,  no evidence of cord compression  Update August 07 2016: He was brought in by her sister but alone at today's clinical visit, he has been receiving EMG guided Botox a injection by [name], last injection was on July 25 2016, received total 300 units  Patient reported that overall his cervical dystonia symptoms is stable, but he is here today concerned worsening bilateral feet lower extremity paresthesia, this started more than 5 years ago, initially involving his feet now progressively getting worse, left more than right, to bilateral leg now,  He continue to complains of unsteady gait, tendency to fall, urinary and bowel urgency  I have personally reviewed MRI seen 2016, MRI of the brain showed generalized atrophy, most prominent in vermis,  genetic or degenerative processes that may present with both ataxia and dystonia such as the spinal cerebellar ataxias and OPCA.   Additional note is made of a few small T2/flair hyperintense foci consistent with minimal small vessel ischemic changes. MRI of cervical spine: Prior C4-C6 ACDF and C2-T2 posterior fusion. The vertebral bodies are normally aligned.   Metal artifact from these fusions obscures many of the images. Multilevel cervical degenerative disc disease, most significant findings are at C5-C6. There appears to be a posterior bony ridge that mildly narrows the neural canal but does not appear to be causing spinal cord compression. However, there is possible foraminal narrowing to the right that could lead to some right C6 nerve root impingement.      REVIEW OF SYSTEMS: Full 14 system review of systems performed and notable only for those listed, all others are neg:  Muscle cramps, walking difficulty, neck stiffness, numbness, speech difficulty,  tremors   ALLERGIES: Allergies  Allergen Reactions  . Baclofen     Weakness, drowsiness    HOME MEDICATIONS: Outpatient Medications Prior to Visit  Medication Sig Dispense Refill  . aspirin 325 MG tablet Take by mouth.    . botulinum toxin Type A (BOTOX) 100 UNITS SOLR injection Inject into the muscle.    . clonazePAM (KLONOPIN) 0.5 MG tablet 1- 2 tabs qhs for insomnia 50 tablet 5  . loratadine (CLARITIN) 10 MG tablet Take 10 mg by mouth daily as needed for allergies.    . mometasone (ELOCON) 0.1 % cream Apply 1 application topically daily as needed.     . Multiple Vitamins tablet Take 1 tablet by mouth.    . traMADol (ULTRAM) 50 MG tablet Take 1 tablet (50 mg total) by mouth 3 (three) times daily. TID prn 90 tablet 3   No facility-administered medications prior to visit.     PAST MEDICAL HISTORY: Past Medical History:  Diagnosis Date  . Dysarthria   . Dystonia   . Gait disturbance   . Myelopathy ([place])   . Paraplegia ([place])     PAST SURGICAL HISTORY: Past Surgical History:  Procedure Laterality Date  . NECK SURGERY     dec 2014    FAMILY HISTORY: Family History  Problem Relation Age of Onset  . Heart disease Mother   . Cancer Mother   . Heart Problems Father   . High Cholesterol Sister   . High blood pressure Sister   . Diabetes Sister   . High blood pressure Sister     SOCIAL HISTORY: Social History   Social History  . Marital status: Single    Spouse name: N/A  . Number of children: 1  . Years of education: N/A   Occupational History  . retired  [employer]   Social History Main Topics  . Smoking status: Former Smoker    Types: Cigarettes    Quit date: 02/01/2013  . Smokeless tobacco: Never Used     Comment: quit   . Alcohol use No  . Drug use: No  . Sexual activity: Not on file   Other Topics Concern  . Not on file   Social History Narrative   Patient lives in independent living [place]. Patient is retired from [employer]. Patient has one  child. Right handed. Patient is divorced.   Caffeine- 12 - Coffee daily - one daily -soda     PHYSICAL EXAM  Vitals:   08/07/16 1211  BP: 114/76  Pulse: 72  Resp: 18  Weight: 136 lb (61.7 kg)  Height: 5\' 6"  (1.676 m)   Body mass index is 21.95 kg/m. Generalized: Well developed, in no acute distress  Head: normocephalic and atraumatic,. Oropharynx benign  Neck: Severe limited range of motion to the neck, difficulty turning to the right side mouth right shoulder elevationHe has mild left shoulder anterior shift, mild left shoulder elevation, moderate retrocollis, mild left tilt, right turn  Cardiac: Regular rate rhythm, no murmur  Musculoskeletal: No deformity   Neurological examination   Mentation: Alert oriented to time, place, history taking. Attention span and concentration appropriate. Recent and remote memory intact. Follows all commands speech dysarthria .   Cranial nerve II-XII: Pupils were equal round reactive to light extraocular movements were full, visual field were full on confrontational test. Facial sensation and strength were normal. hearing was intact to finger rubbing bilaterally. Uvula tongue midline. head turning and shoulder shrug were normal and symmetric.Tongue protrusion into cheek strength was normal. Motor: normal bulk and tone, full strength in the BUE, he has moderate bilateral lower extremity spasticity No focal weakness Sensory: normal and symmetric to light touch, pinprick, and Vibration,  Coordination: finger-nose-finger, heel-to-shin bilaterally, no dysmetria Reflexes: 3+ upper lower and symmetric, plantar responses were extensor bilaterally. Gait and Station: Rising up from seated position with pushoff , Stiff, cautious, unsteady gait  DIAGNOSTIC DATA (LABS, IMAGING, TESTING) -  ASSESSMENT AND PLAN 60 y.o. 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 Ph.D.  [facility] [place], [place] [address] Phone: [phone] Fax:      [phone]

## 2016-09-05 ENCOUNTER — Ambulatory Visit: Payer: Medicare Other | Admitting: Physical Medicine & Rehabilitation

## 2016-10-03 ENCOUNTER — Encounter (INDEPENDENT_AMBULATORY_CARE_PROVIDER_SITE_OTHER): Payer: Self-pay

## 2016-10-03 ENCOUNTER — Ambulatory Visit (INDEPENDENT_AMBULATORY_CARE_PROVIDER_SITE_OTHER): Payer: Medicare Other | Admitting: Neurology

## 2016-10-03 DIAGNOSIS — G243 Spasmodic torticollis: Secondary | ICD-10-CM

## 2016-10-03 DIAGNOSIS — R202 Paresthesia of skin: Secondary | ICD-10-CM | POA: Diagnosis not present

## 2016-10-03 DIAGNOSIS — Z0289 Encounter for other administrative examinations: Secondary | ICD-10-CM

## 2016-10-03 DIAGNOSIS — R2689 Other abnormalities of gait and mobility: Secondary | ICD-10-CM

## 2016-10-03 DIAGNOSIS — R471 Dysarthria and anarthria: Secondary | ICD-10-CM

## 2016-10-03 DIAGNOSIS — G959 Disease of spinal cord, unspecified: Secondary | ICD-10-CM

## 2016-10-03 DIAGNOSIS — R269 Unspecified abnormalities of gait and mobility: Secondary | ICD-10-CM

## 2016-10-03 NOTE — Procedures (Signed)
Full Name: Patrick Daniel Gender: Male MRN #: [id number] Date of Birth: 07-Mar-1956    Visit Date: 10/03/2016 11:39 Age: 60 Years 2 Months Old Examining Physician: Marcial Pacas, MD  Referring Physician: Krista Blue History: 60 years old male, with history of cervical dystonia, cervical spondylitic myelopathy, status post decompression, now presenting with worsening gait abnormality, lower extremity paresthesia  Summary of the test: Nerve conduction study: Bilateral sural sensory responses were normal. Left superficial peroneal sensory responses were normal. Right superficial peroneal sensory response showed mildly decreased snap amplitude.  Bilateral peroneal to EDB and tibial motor responses were normal.  Electromyography: Selected needle examination of bilateral lower extremity and bilateral lumbar sacral paraspinal showed no significant abnormality.   Conclusion: This is a normal study, there is no electrodiagnostic evidence of large fiber peripheral neuropathy, or lumbar sacral radiculopathy.    ------------------------------- Marcial Pacas, M.D.  [facility] [place], [place] [address] Tel: [phone] Fax: [phone]        [facility]    Nerve / Sites Rec. Site Peak Lat Ref. Amp.1-2 Ref. Distance    ms ms V V cm  L Sural - Ankle (Calf)     Calf Ankle 3.54 ?4.40 16.8 ?6.0 14  R Sural - Ankle (Calf)     Calf Ankle 3.44 ?4.40 16.0 ?6.0 14  L Superficial peroneal - Ankle     Lat leg Ankle 4.27 ?4.40 6.1 ?6.0 14  R Superficial peroneal - Ankle     Lat leg Ankle 4.53 ?4.40 2.9 ?6.0 14     [facility]    Nerve / Sites Muscle Latency Ref. Amplitude Ref. Rel Amp Segments Distance Lat Diff Velocity Ref. Area    ms ms mV mV %  cm ms m/s m/s mVms  L Peroneal - EDB     Ankle EDB 5.5 ?6.5 3.2 ?2.0 100 Ankle - EDB 9    14.7     Fib head EDB 12.3  2.6  81.5 Fib head - Ankle 30 6.8 44 ?44      Pop fossa EDB 15.4  2.0  77.2 Pop fossa - Fib head 11 3.0 36 ?44 10.6           Pop fossa - Ankle  9.8     R Peroneal - EDB     Ankle EDB 5.4 ?6.5 2.6 ?2.0 100 Ankle - EDB 9    9.2     Fib head EDB 12.4  2.4  91.7 Fib head - Ankle 29 7.0 42 ?44 8.8     Pop fossa EDB 14.9  2.2  94.1 Pop fossa - Fib head 10 2.5 40 ?44 8.1         Pop fossa - Ankle  9.5     L Tibial - AH     Ankle AH 5.3 ?5.8 1.5 ?4.0 100 Ankle - AH 9    5.9     Pop fossa AH 15.1  0.9  60.1 Pop fossa - Ankle 35 9.8 36 ?41 3.5  R Tibial - AH     Ankle AH 6.1 ?5.8 2.1 ?4.0 100 Ankle - AH 9    5.2     Pop fossa AH 14.8  1.0  49.1 Pop fossa - Ankle 30 8.7 34 ?41 2.8     F  Wave    Nerve F Lat Ref.   ms ms  L Peroneal - EDB 53.4 ?56.0  L Tibial - AH 53.5 ?56.0  R Peroneal - EDB 54.7 ?56.0  R Tibial - AH 53.9 ?56.0     EMG full       EMG Summary Table    Spontaneous MUAP Recruitment  Muscle IA Fib PSW Fasc Other Amp Dur. Poly Pattern  L. Tibialis anterior Normal None None None _______ Normal Normal Normal Normal  L. Tibialis posterior Normal None None None _______ Normal Normal Normal Normal  L. Vastus lateralis Normal None None None _______ Normal Normal Normal Normal  L. Biceps femoris (long head) Normal None None None _______ Normal Normal Normal Normal  R. Tibialis posterior Normal None None None _______ Normal Normal Normal Normal  R. Tibialis anterior Normal None None None _______ Normal Normal Normal Normal  R. Vastus lateralis Normal None None None _______ Normal Normal Normal Normal  R. Biceps femoris (long head) Normal None None None _______ Normal Normal Normal Normal  R. Lumbar paraspinals Normal None None None _______ Normal Normal Normal Normal  L. Lumbar paraspinals Normal None None None _______ Normal Normal Normal Normal

## 2016-10-14 ENCOUNTER — Ambulatory Visit
Admission: RE | Admit: 2016-10-14 | Discharge: 2016-10-14 | Disposition: A | Discharge status: Home / Self Care | Payer: Medicare Other | Source: Ambulatory Visit | Attending: Neurology | Admitting: Neurology

## 2016-10-14 DIAGNOSIS — R471 Dysarthria and anarthria: Secondary | ICD-10-CM

## 2016-10-14 DIAGNOSIS — G959 Disease of spinal cord, unspecified: Secondary | ICD-10-CM

## 2016-10-14 DIAGNOSIS — G243 Spasmodic torticollis: Secondary | ICD-10-CM

## 2016-10-14 DIAGNOSIS — R269 Unspecified abnormalities of gait and mobility: Secondary | ICD-10-CM

## 2016-10-14 DIAGNOSIS — R2689 Other abnormalities of gait and mobility: Secondary | ICD-10-CM | POA: Diagnosis not present

## 2016-10-14 DIAGNOSIS — R202 Paresthesia of skin: Secondary | ICD-10-CM

## 2016-11-05 ENCOUNTER — Ambulatory Visit (INDEPENDENT_AMBULATORY_CARE_PROVIDER_SITE_OTHER): Payer: Medicare Other | Admitting: Neurology

## 2016-11-05 ENCOUNTER — Encounter: Payer: Self-pay | Admitting: Neurology

## 2016-11-05 VITALS — BP 107/62 | HR 82 | Ht 66.0 in | Wt 133.8 lb

## 2016-11-05 DIAGNOSIS — G243 Spasmodic torticollis: Secondary | ICD-10-CM

## 2016-11-05 DIAGNOSIS — G8220 Paraplegia, unspecified: Secondary | ICD-10-CM

## 2016-11-05 MED ORDER — INCOBOTULINUMTOXINA 100 UNITS IM SOLR
200.0000 [IU] | INTRAMUSCULAR | Status: DC
  Administered 2016-11-05: 200 [IU] via INTRAMUSCULAR

## 2016-11-05 MED ORDER — DULOXETINE HCL 60 MG PO CPEP: 60.0000 mg | ORAL_CAPSULE | Freq: Every day | ORAL | 12 refills | Status: DC

## 2016-11-05 MED ORDER — TRAMADOL HCL 50 MG PO TABS: 50.0000 mg | ORAL_TABLET | Freq: Three times a day (TID) | ORAL | 5 refills | Status: DC

## 2016-11-05 MED ORDER — CLONAZEPAM 0.5 MG PO TABS: ORAL_TABLET | ORAL | 5 refills | Status: DC

## 2016-11-05 NOTE — Progress Notes (Signed)
**  Xeomin 100 units x 2 vials, Lot [id number], Exp 10/2017, [place] [id number], office supply.//mck,rn**

## 2016-11-05 NOTE — Addendum Note (Signed)
Addended by: Marcial Pacas on: 11/05/2016 03:01 PM   Modules accepted: Orders

## 2016-11-05 NOTE — Progress Notes (Signed)
[facility]  PATIENT: Patrick Daniel DOB: 09-27-1956   REASON FOR VISIT: Follow-up for paraplegia, cervical dystonia gait disturbance, dysarthria HISTORY FROM: Patient    HISTORY OF PRESENT ILLNESS:HISTORY: Patrick Daniel return to evaluation for his continued gait difficulty  Patrick Daniel is a 61 year old right-handed Caucasian male. He has past medical history of hyperlipidemia, depression, He presented with gradual onset language difficulty, gait difficulty since 2009. He was evaluated by [name] neurologist at [name], reported normal MRI of brain, he was diagnosed with cervical dystonia, was given Botox injection of the cervical muscles, which has improved his symptoms temporarily.  He moved back to the [name] in April 2012, began to seek medical attention, initially evaluated by [name], reported again normal MRI of brain, also had trial of Haldol and baclofen without much improvement.  Sinemet did not help him. Artane and baclofen provide no help either. 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 He was evaluated by local neurosurgeon [name] at [facility], Because concomitant central nervous system degenerative disorder, cervical dystonia, he is hesitate to perform decompression surgery. I later referred him to [name] neurology, evaluated by [name], who has suggested Botox injection to alleviate his cervical dystonia symptoms first, may consider cervical decompression surgery later. Also, likely there is a component of central nervous system degenerative disorder, such as spinocerebellar ataxia,.  He has significant dysarthria, also dysphagia, gait difficulty, frequent falling, he denies sensory changes. Normal or  negative CMP, PEP, CBC, HIV, Lyme disease, SSA/B, TSH, ACE, RPR, CRP, CPK. He received first EMG guided Botox injection since March 2012, he reported great improvement, he has been receiving BOTOX injection every 3 months, there is significant improvement of his abnormal neck postural, but still with severe retrocollis, left turn, right tilt. he began to have moderate improvement after repeat injection. He was evaluated by his neurosurgeon [name] at [name], repeat MRI cervical showed advanced multilevel degenerative changes of the cervical spine, most pronounced at C5-C6, where there is severe central stenosis, impression on the cord with focal myelomalacia and severe bilateral neuroforaminal narrowing. Overall appearance is similar to prior study from 09/18/2010. He had posterior decompression and fusion surgery in December 2013, recovering well, but complains of neck muscle spasm, and chest muscle spasm continued spastic gait difficulty, occasional urinary incontinence, his posture is much improved, but has limited range of motion of his neck  UPDATE June 27th 2016:YYHe had EMG guided BOTOX injection by me since March 2012, most recent EMG guided Botox injection was by [name] April 10 2015,300 units of Botox A Today he came in for neurological evaluation for continued unsteady gait, difficulty finding a comfortable position, urinary urgency, he denies constipation, denies upper extremity paresthesia or weakness, slow worsening dysarthria, Overall, is continued slow progression compared to his baseline, UPDATE August 1st 2016: He is with his sister at today's clinical visit, he continue complains significant gait difficulty, slow worsening dysarthria, abnormal neck posturing, tendency to turn towards the left side,  We have reviewed MRI of the brain, he has out generalized atrophy, prominent superior vermis atrophy, out of proportion to other part of the brain, MRI of cervical spine,  posterior surgical change at C4, C6, and C2-T2 posterior fusion, metal artifact, multilevel degenerative changes, most significant at C5-6, causing mild canal stenosis,  no evidence of cord compression  Update August 07 2016: He was brought in by her sister but alone at today's clinical visit, he has been receiving EMG guided Botox a injection by [name], last injection was on July 25 2016, received total 300 units  Patient reported that overall his cervical dystonia symptoms is stable, but he is here today concerned worsening bilateral feet lower extremity paresthesia, this started more than 5 years ago, initially involving his feet now progressively getting worse, left more than right, to bilateral leg now,  He continue to complains of unsteady gait, tendency to fall, urinary and bowel urgency  I have personally reviewed MRI seen 2016, MRI of the brain showed generalized atrophy, most prominent in vermis,  genetic or degenerative processes that may present with both ataxia and dystonia such as the spinal cerebellar ataxias and OPCA.   Additional note is made of a few small T2/flair hyperintense foci consistent with minimal small vessel ischemic changes. MRI of cervical spine: Prior C4-C6 ACDF and C2-T2 posterior fusion. The vertebral bodies are normally aligned.   Metal artifact from these fusions obscures many of the images. Multilevel cervical degenerative disc disease, most significant findings are at C5-C6. There appears to be a posterior bony ridge that mildly narrows the neural canal but does not appear to be causing spinal cord compression. However, there is possible foraminal narrowing to the right that could lead to some right C6 nerve root impingement.     UPDATE Jan 10th 2018: He is here for repeat EMG guided xeomin injection for his cervical dystonia, we used 200 units today, he continue complains of bilateral lower extremity paresthesia, increased gait abnormality,  We have  personally reviewed MRI lumbar in December 2017 no significant abnormality. MRI of the brain in 2016, significant superior cerebella vermis atrophy, he reported long history of heavy alcohol abuse for more than 20 years,  MRI of the cervical spine in 2016, posterior cervical 4-6 decompression and fusion, no significant canal stenosis.  He is taking clonazepam 0.5 milligrams 1-2 tablets as needed for sleeping, tramadol as needed for pain, he continue complains of neck pain, limited range of motion  REVIEW OF SYSTEMS: Full 14 system review of systems performed and notable only for those listed, all others are neg:  Muscle cramps, walking difficulty, neck stiffness, numbness, speech difficulty, tremors   ALLERGIES: Allergies  Allergen Reactions  . Baclofen     Weakness, drowsiness    HOME MEDICATIONS: Outpatient Medications Prior to Visit  Medication Sig Dispense Refill  . aspirin 325 MG tablet Take by mouth.    . clonazePAM (KLONOPIN) 0.5 MG tablet 1- 2 tabs qhs for insomnia 50 tablet 5  . loratadine (CLARITIN) 10 MG tablet Take 10 mg by mouth daily as needed for allergies.    . mometasone (ELOCON) 0.1 % cream Apply 1 application topically daily as needed.     . Multiple Vitamins tablet Take 1 tablet by mouth.    . traMADol (ULTRAM) 50 MG tablet Take 1 tablet (50 mg total) by mouth 3 (three) times daily. TID prn 90 tablet 3  . botulinum toxin Type A (BOTOX) 100 UNITS SOLR injection Inject into the muscle.     No facility-administered medications prior to visit.     PAST MEDICAL HISTORY: Past Medical History:  Diagnosis Date  . Dysarthria   . Dystonia   . Gait disturbance   . Myelopathy ([place])   . Paraplegia ([place])     PAST SURGICAL HISTORY:  Past Surgical History:  Procedure Laterality Date  . NECK SURGERY     dec 2014    FAMILY HISTORY: Family History  Problem Relation Age of Onset  . Heart disease Mother   . Cancer Mother   . Heart Problems Father   . High  Cholesterol Sister   . High blood pressure Sister   . Diabetes Sister   . High blood pressure Sister     SOCIAL HISTORY: Social History   Social History  . Marital status: Single    Spouse name: N/A  . Number of children: 1  . Years of education: N/A   Occupational History  . retired     Therapist, art   Social History Main Topics  . Smoking status: Former Smoker    Types: Cigarettes    Quit date: 02/01/2013  . Smokeless tobacco: Never Used     Comment: quit   . Alcohol use No  . Drug use: No  . Sexual activity: Not on file   Other Topics Concern  . Not on file   Social History Narrative   Patient lives in independent living [place]. Patient is retired from [employer]. Patient has one child. Right handed. Patient is divorced.   Caffeine- 12 - Coffee daily - one daily -soda     PHYSICAL EXAM  Vitals:   11/05/16 1350  BP: 107/62  Pulse: 82  Weight: 133 lb 12 oz (60.7 kg)  Height: 5\' 6"  (1.676 m)   Body mass index is 21.59 kg/m. Generalized: Well developed, in no acute distress  Head: normocephalic and atraumatic,. Oropharynx benign  Neck: Severe limited range of motion to the neck, difficulty turning to the right side mouth right shoulder elevationHe has mild left shoulder anterior shift, mild left shoulder elevation, moderate retrocollis, mild left tilt, right turn  Cardiac: Regular rate rhythm, no murmur  Musculoskeletal: No deformity   Neurological examination   Mentation: Alert oriented to time, place, history taking. Attention span and concentration appropriate. Recent and remote memory intact. Follows all commands, he has mild spastic dysarthria.   Cranial nerve II-XII: Pupils were equal round reactive to light extraocular movements were full, visual field were full on confrontational test. Facial sensation and strength were normal. hearing was intact to finger rubbing bilaterally. Uvula tongue midline. head turning and shoulder shrug were normal and  symmetric.Tongue protrusion into cheek strength was normal. Motor: normal bulk and tone, full strength in the BUE, he has moderate bilateral lower extremity spasticity No focal weakness Sensory: normal and symmetric to light touch, pinprick, and Vibration,  Coordination: finger-nose-finger, heel-to-shin bilaterally, no dysmetria Reflexes: 3+ upper lower and symmetric, plantar responses were extensor bilaterally. Gait and Station: Rising up from seated position with pushoff , Stiff, cautious, unsteady gait  DIAGNOSTIC DATA (LABS, IMAGING, TESTING) -  ASSESSMENT AND PLAN 61 y.o. 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 Ph.D.  [facility] [place], [place] [address] Phone: [phone] Fax:      [phone]

## 2016-11-11 ENCOUNTER — Telehealth: Payer: Self-pay | Admitting: Neurology

## 2016-11-11 ENCOUNTER — Other Ambulatory Visit: Payer: Self-pay | Admitting: *Deleted

## 2016-11-11 ENCOUNTER — Encounter: Payer: Self-pay | Admitting: *Deleted

## 2016-11-11 MED ORDER — DULOXETINE HCL 60 MG PO CPEP: 60.0000 mg | ORAL_CAPSULE | Freq: Every day | ORAL | 11 refills | Status: DC

## 2016-11-11 NOTE — Telephone Encounter (Signed)
He would like his duloxetine prescription to be sent to [facility] instead of [facility].  Chart updated and rx sent to requested pharmacy.

## 2016-11-11 NOTE — Telephone Encounter (Signed)
Patient called in reference to a prescription that was called into the wrong pharmacy (sent to [facility]).  Patient was unaware of the name of the medication so I'm unable to provide.  Pharmacy- [place].  Please call

## 2016-12-11 DIAGNOSIS — H2513 Age-related nuclear cataract, bilateral: Secondary | ICD-10-CM | POA: Diagnosis not present

## 2016-12-29 ENCOUNTER — Ambulatory Visit: Payer: Medicare Other | Admitting: Neurology

## 2017-01-09 DIAGNOSIS — Z0000 Encounter for general adult medical examination without abnormal findings: Secondary | ICD-10-CM | POA: Diagnosis not present

## 2017-01-09 DIAGNOSIS — Z1211 Encounter for screening for malignant neoplasm of colon: Secondary | ICD-10-CM | POA: Diagnosis not present

## 2017-01-09 DIAGNOSIS — Z1389 Encounter for screening for other disorder: Secondary | ICD-10-CM | POA: Diagnosis not present

## 2017-01-09 DIAGNOSIS — Z139 Encounter for screening, unspecified: Secondary | ICD-10-CM | POA: Diagnosis not present

## 2017-01-19 DIAGNOSIS — Z125 Encounter for screening for malignant neoplasm of prostate: Secondary | ICD-10-CM | POA: Diagnosis not present

## 2017-02-11 ENCOUNTER — Encounter: Payer: Self-pay | Admitting: Neurology

## 2017-02-11 ENCOUNTER — Ambulatory Visit (INDEPENDENT_AMBULATORY_CARE_PROVIDER_SITE_OTHER): Payer: Medicare Other | Admitting: Neurology

## 2017-02-11 VITALS — BP 122/69 | HR 92 | Ht 66.0 in | Wt 135.5 lb

## 2017-02-11 DIAGNOSIS — G243 Spasmodic torticollis: Secondary | ICD-10-CM

## 2017-02-11 DIAGNOSIS — G8220 Paraplegia, unspecified: Secondary | ICD-10-CM

## 2017-02-11 DIAGNOSIS — R269 Unspecified abnormalities of gait and mobility: Secondary | ICD-10-CM

## 2017-02-11 MED ORDER — INCOBOTULINUMTOXINA 100 UNITS IM SOLR
300.0000 [IU] | INTRAMUSCULAR | Status: DC
  Administered 2017-02-11: 300 [IU] via INTRAMUSCULAR

## 2017-02-11 NOTE — Progress Notes (Signed)
**  Xeomin 200 units x 1 vial, [place] 0259-1620-10, Lot [id number], Exp 01/2017, office supply.//mck,rn** **Xeomin 100 units x 1 vial, NDC 1478-2956-21, Lot [id number], Exp 01/2019, office supply.//mck,rn**

## 2017-02-11 NOTE — Progress Notes (Signed)
[facility]  PATIENT: Patrick Daniel DOB: 09-27-1956   REASON FOR VISIT: Follow-up for paraplegia, cervical dystonia gait disturbance, dysarthria HISTORY FROM: Patient    HISTORY OF PRESENT ILLNESS:HISTORY: Patrick Daniel return to evaluation for his continued gait difficulty  Patrick Daniel is a 61 year old right-handed Caucasian male. He has past medical history of hyperlipidemia, depression, He presented with gradual onset language difficulty, gait difficulty since 2009. He was evaluated by [name] neurologist at [name], reported normal MRI of brain, he was diagnosed with cervical dystonia, was given Botox injection of the cervical muscles, which has improved his symptoms temporarily.  He moved back to the [name] in April 2012, began to seek medical attention, initially evaluated by [name], reported again normal MRI of brain, also had trial of Haldol and baclofen without much improvement.  Sinemet did not help him. Artane and baclofen provide no help either. 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 He was evaluated by local neurosurgeon [name] at [facility], Because concomitant central nervous system degenerative disorder, cervical dystonia, he is hesitate to perform decompression surgery. I later referred him to [name] neurology, evaluated by [name], who has suggested Botox injection to alleviate his cervical dystonia symptoms first, may consider cervical decompression surgery later. Also, likely there is a component of central nervous system degenerative disorder, such as spinocerebellar ataxia,.  He has significant dysarthria, also dysphagia, gait difficulty, frequent falling, he denies sensory changes. Normal or  negative CMP, PEP, CBC, HIV, Lyme disease, SSA/B, TSH, ACE, RPR, CRP, CPK. He received first EMG guided Botox injection since March 2012, he reported great improvement, he has been receiving BOTOX injection every 3 months, there is significant improvement of his abnormal neck postural, but still with severe retrocollis, left turn, right tilt. he began to have moderate improvement after repeat injection. He was evaluated by his neurosurgeon [name] at [name], repeat MRI cervical showed advanced multilevel degenerative changes of the cervical spine, most pronounced at C5-C6, where there is severe central stenosis, impression on the cord with focal myelomalacia and severe bilateral neuroforaminal narrowing. Overall appearance is similar to prior study from 09/18/2010. He had posterior decompression and fusion surgery in December 2013, recovering well, but complains of neck muscle spasm, and chest muscle spasm continued spastic gait difficulty, occasional urinary incontinence, his posture is much improved, but has limited range of motion of his neck  UPDATE June 27th 2016:YYHe had EMG guided BOTOX injection by me since March 2012, most recent EMG guided Botox injection was by [name] April 10 2015,300 units of Botox A Today he came in for neurological evaluation for continued unsteady gait, difficulty finding a comfortable position, urinary urgency, he denies constipation, denies upper extremity paresthesia or weakness, slow worsening dysarthria, Overall, is continued slow progression compared to his baseline, UPDATE August 1st 2016: He is with his sister at today's clinical visit, he continue complains significant gait difficulty, slow worsening dysarthria, abnormal neck posturing, tendency to turn towards the left side,  We have reviewed MRI of the brain, he has out generalized atrophy, prominent superior vermis atrophy, out of proportion to other part of the brain, MRI of cervical spine,  posterior surgical change at C4, C6, and C2-T2 posterior fusion, metal artifact, multilevel degenerative changes, most significant at C5-6, causing mild canal stenosis,  no evidence of cord compression  Update August 07 2016: He was brought in by her sister but alone at today's clinical visit, he has been receiving EMG guided Botox a injection by [name], last injection was on July 25 2016, received total 300 units  Patient reported that overall his cervical dystonia symptoms is stable, but he is here today concerned worsening bilateral feet lower extremity paresthesia, this started more than 5 years ago, initially involving his feet now progressively getting worse, left more than right, to bilateral leg now,  He continue to complains of unsteady gait, tendency to fall, urinary and bowel urgency  I have personally reviewed MRI seen 2016, MRI of the brain showed generalized atrophy, most prominent in vermis,  genetic or degenerative processes that may present with both ataxia and dystonia such as the spinal cerebellar ataxias and OPCA.   Additional note is made of a few small T2/flair hyperintense foci consistent with minimal small vessel ischemic changes. MRI of cervical spine: Prior C4-C6 ACDF and C2-T2 posterior fusion. The vertebral bodies are normally aligned.   Metal artifact from these fusions obscures many of the images. Multilevel cervical degenerative disc disease, most significant findings are at C5-C6. There appears to be a posterior bony ridge that mildly narrows the neural canal but does not appear to be causing spinal cord compression. However, there is possible foraminal narrowing to the right that could lead to some right C6 nerve root impingement.     UPDATE Jan 10th 2018: He is here for repeat EMG guided xeomin injection for his cervical dystonia, we used 200 units today, he continue complains of bilateral lower extremity paresthesia, increased gait abnormality,  We have  personally reviewed MRI lumbar in December 2017 no significant abnormality. MRI of the brain in 2016, significant superior cerebella vermis atrophy, he reported long history of heavy alcohol abuse for more than 20 years,  MRI of the cervical spine in 2016, posterior cervical 4-6 decompression and fusion, no significant canal stenosis.  He is taking clonazepam 0.5 milligrams 1-2 tablets as needed for sleeping, tramadol as needed for pain, he continue complains of neck pain, limited range of motion  Update February 11 2017: He tolerated the injection well in January, no significant side effect notice, Cymbalta has helped his neck pain  He complains of increased gait abnormality will refer him to physical therapy today, hope PT can also help his neck pain  REVIEW OF SYSTEMS: Full 14 system review of systems performed and notable only for those listed, all others are neg:  Muscle cramps, walking difficulty, neck stiffness, numbness, speech difficulty, tremors   ALLERGIES: Allergies  Allergen Reactions  . Baclofen     Weakness, drowsiness    HOME MEDICATIONS: Outpatient Medications Prior to Visit  Medication Sig Dispense Refill  . aspirin 325 MG tablet Take by mouth.    . clonazePAM (KLONOPIN) 0.5 MG tablet 1- 2 tabs qhs for insomnia 50 tablet 5  . DULoxetine (CYMBALTA) 60 MG capsule Take 1 capsule (60 mg total) by mouth daily. 30 capsule 11  . IncobotulinumtoxinA (XEOMIN IM) Inject into the muscle every 3 (three) months.     . loratadine (CLARITIN) 10 MG tablet Take 10 mg by mouth daily as needed for allergies.    . mometasone (ELOCON) 0.1 % cream Apply 1 application topically daily as needed.     . Multiple Vitamins tablet Take 1 tablet by mouth.    . traMADol (ULTRAM) 50 MG tablet Take 1 tablet (50  mg total) by mouth 3 (three) times daily. TID prn 90 tablet 5   Facility-Administered Medications Prior to Visit  Medication Dose Route Frequency Provider Last Rate Last Dose  .  incobotulinumtoxinA (XEOMIN) 100 units injection 200 Units  200 Units Intramuscular Q90 days Marcial Pacas, MD   200 Units at 11/05/16 1500    PAST MEDICAL HISTORY: Past Medical History:  Diagnosis Date  . Dysarthria   . Dystonia   . Gait disturbance   . Myelopathy ([place])   . Paraplegia ([place])     PAST SURGICAL HISTORY: Past Surgical History:  Procedure Laterality Date  . NECK SURGERY     dec 2014    FAMILY HISTORY: Family History  Problem Relation Age of Onset  . Heart disease Mother   . Cancer Mother   . Heart Problems Father   . High Cholesterol Sister   . High blood pressure Sister   . Diabetes Sister   . High blood pressure Sister     SOCIAL HISTORY: Social History   Social History  . Marital status: Single    Spouse name: N/A  . Number of children: 1  . Years of education: N/A   Occupational History  . retired     Therapist, art   Social History Main Topics  . Smoking status: Former Smoker    Types: Cigarettes    Quit date: 02/01/2013  . Smokeless tobacco: Never Used     Comment: quit   . Alcohol use No  . Drug use: No  . Sexual activity: Not on file   Other Topics Concern  . Not on file   Social History Narrative   Patient lives in independent living [place]. Patient is retired from [employer]. Patient has one child. Right handed. Patient is divorced.   Caffeine- 12 - Coffee daily - one daily -soda     PHYSICAL EXAM  There were no vitals filed for this visit. There is no height or weight on file to calculate BMI. Generalized: Well developed, in no acute distress  Head: normocephalic and atraumatic,. Oropharynx benign  Neck: Severe limited range of motion to the neck, difficulty turning to the right side mouth right shoulder elevationHe has mild left shoulder anterior shift, mild left shoulder elevation, moderate retrocollis, mild left tilt, right turn  Cardiac: Regular rate rhythm, no murmur  Musculoskeletal: No deformity   Neurological examination     Mentation: Alert oriented to time, place, history taking. Attention span and concentration appropriate. Recent and remote memory intact. Follows all commands, he has mild spastic dysarthria.   Cranial nerve II-XII: Pupils were equal round reactive to light extraocular movements were full, visual field were full on confrontational test. Facial sensation and strength were normal. hearing was intact to finger rubbing bilaterally. Uvula tongue midline. head turning and shoulder shrug were normal and symmetric.Tongue protrusion into cheek strength was normal. Motor: normal bulk and tone, full strength in the BUE, he has moderate bilateral lower extremity spasticity No focal weakness Sensory: normal and symmetric to light touch, pinprick, and Vibration,  Coordination: finger-nose-finger, heel-to-shin bilaterally, no dysmetria Reflexes: 3+ upper lower and symmetric, plantar responses were extensor bilaterally. Gait and Station: Rising up from seated position with pushoff , Stiff, cautious, unsteady gait  DIAGNOSTIC DATA (LABS, IMAGING, TESTING) -  ASSESSMENT AND PLAN 61 y.o. 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 Evidence of superior cerebellar vermis atrophy consistent  with his history of lumbar alcohol abuse  Cervical dystonia:  Limited range of motion of neck flexion and extension, turning, mild left shoulder elevation, retrocollis, moderate left turn,   Under EMG guidance used  300 units of xeomin  Left inferior oblique capitis 25 units Right inferior oblique capitis 25 units Left splenius capitis 25 units Left splenius cervix 25 units  Left longissimus capitis 25 units  Right sternocleidomastoid 25x2=50 units Left levator scapular 25 units  Right inferior oblique capitis 25 units Left scalenous posterior 25 units Left illiocostallis  25 units Left upper trapezius 25 units  Marcial Pacas, M.D. Ph.D.  [facility] [place], [place] [address] Phone: [phone] Fax:      [phone]

## 2017-03-10 ENCOUNTER — Ambulatory Visit: Payer: Medicare Other | Attending: Neurology | Admitting: Physical Therapy

## 2017-03-10 DIAGNOSIS — R2689 Other abnormalities of gait and mobility: Secondary | ICD-10-CM

## 2017-03-10 DIAGNOSIS — R293 Abnormal posture: Secondary | ICD-10-CM | POA: Diagnosis not present

## 2017-03-10 DIAGNOSIS — R29818 Other symptoms and signs involving the nervous system: Secondary | ICD-10-CM

## 2017-03-10 DIAGNOSIS — R296 Repeated falls: Secondary | ICD-10-CM

## 2017-03-11 ENCOUNTER — Encounter: Payer: Self-pay | Admitting: Physical Therapy

## 2017-03-11 NOTE — Therapy (Signed)
[facility] [address] [place], [place], [address] Phone: [phone]   Fax:  [phone]  Physical Therapy Evaluation  Patient Details  Name: Patrick Daniel MRN: [id number] Date of Birth: 11/18/1955 Referring Provider: Dr. Marcial Pacas  Encounter Date: 03/10/2017      PT End of Session - 03/11/17 1308    Visit Number 1   Number of Visits 9   Date for PT Re-Evaluation 05/08/17   Authorization Time Period 03/10/17 to 05/08/17   PT Start Time 1316   PT Stop Time 1345   PT Time Calculation (min) 29 min   Activity Tolerance Patient tolerated treatment well   Behavior During Therapy [facility] for tasks assessed/performed      Past Medical History:  Diagnosis Date  . Dysarthria   . Dystonia   . Gait disturbance   . Myelopathy ([place])   . Paraplegia [facility])     Past Surgical History:  Procedure Laterality Date  . NECK SURGERY     dec 2014    There were no vitals filed for this visit.       Subjective Assessment - 03/11/17 0709    Subjective Patient reports his doctor referred him for PT at his request as he falls frequently and has significant neck pain. He already has an extensive exercise program he completes every day.  (Walks 3 miles in 60-70 minutes; 35 reps of squats, leg lifts, countertop pushups, sit ups, "fire hydrant," bicycle (supine), bicep curls, and kettlebell lifts). He does not do specific neck exercises or ROM.    Patient Stated Goals Wants to feel well enough to travel; able to walk more easily on changing grades of terrain   Currently in Pain? No/denies  my neck is sore right now, but not painful            [facility] PT Assessment - 03/10/17 1315      Assessment   Medical Diagnosis cervical dystonia, gait disturbance   Referring Provider Dr. Marcial Pacas   Onset Date/Surgical Date --  unclear, prior to 2013 cervical decompression     Precautions   Precautions Fall     Balance Screen   Has the  patient fallen in the past 6 months Yes   How many times? 6  falls approximately 1 time/month   Has the patient had a decrease in activity level because of a fear of falling?  No   Is the patient reluctant to leave their home because of a fear of falling?  No     [place] - quad;Walker - 2 wheels  SBQC     Prior Function   Level of Independence Independent with household mobility without device  uses no device inside home; SBQC in community   Leisure walking and exercising     Cognition   Overall Cognitive Status --  not specifically assessed; sister present, made no correctio   Behaviors Restless  frequent neck spasms, uses hand on top of his head to relaz     Observation/Other Assessments   Observations Walks into clinic carrying SBQC (tip ~12 inches above floor) with + Losses of balance (leans into the wall or PT assisted to recover balance--does not put cane in contact with floor at all)   Focus on Therapeutic Outcomes (FOTO)  Functional status score 60 (risk adjusted to 52)   Activities of Balance Confidence Scale (ABC Scale)  45.6%   Fear  Avoidance Belief Questionnaire (FABQ)  19     Sensation   Light Touch Impaired by gross assessment   Additional Comments Pt reports numbness in his feet and lower legs and no numbness in his UEs     Coordination   Gross Motor Movements are Fluid and Coordinated No   Fine Motor Movements are Fluid and Coordinated No   Heel Shin Test accurate, however very slow; toe tapping asynchronous     ROM / Strength   AROM / PROM / Strength AROM;Strength     AROM   Overall AROM  Deficits   Overall AROM Comments all WNLs except cervical spine: rt rotation limited but better than Lt rotation (Lt SCM tends to spasm), ~30 degrees total cervical flexion and extension     Strength   Overall Strength Within functional limits for tasks performed  LEs only assessed     Transfers   Transfers Sit  to Stand;Stand to Sit   Sit to Stand 4: Min guard;From chair/3-in-1;Without upper extremity assist  typically uses UEs, however can complete without   Five time sit to stand comments  16.25 (normative data for 61 yo  is 8.4 sec)  did not use UE support   Stand to Sit 4: Min guard;Without upper extremity assist;To chair/3-in-1  typically uses UEs, however can complete without   Transfer Cueing vc for fully turning prior to sitting     Ambulation/Gait   Ambulation/Gait Yes   Ambulation/Gait Assistance 4: Min assist   Ambulation/Gait Assistance Details without SBQC, very slow and guarded with wide BOS, imbalance with 180 turn required min assist to correct   Ambulation Distance (Feet) 60 Feet  15, 30   Assistive device None   Gait Pattern Step-through pattern;Decreased arm swing - right;Decreased arm swing - left;Decreased stride length;Decreased step length - right;Right foot flat;Left foot flat;Trunk rotated posteriorly on left;Poor foot clearance - left;Poor foot clearance - right   Ambulation Surface Level;Indoor     Balance   Balance Assessed Yes     Static Standing Balance   Static Standing - Balance Support No upper extremity supported   Static Standing - Level of Assistance 5: Stand by assistance   Static Standing - Comment/# of Minutes 60 sec feet apart; reqd assist when trying to place feet together due to LOB                           PT Education - 03/11/17 1306    Education provided Yes   Education Details encuraged use of SBQC, however pt proceeded to walk to check-out again carrying [employer]) Educated Patient;Other (comment)  sister   Methods Explanation   Comprehension Verbalized understanding;Need further instruction          PT Short Term Goals - 03/11/17 1553      PT SHORT TERM GOAL #1   Title Patient will be independent with basic HEP focusing on improved balance and gait. (TARGET for all STGs 04/10/17)   Time 4   Period Weeks    Status New     PT SHORT TERM GOAL #2   Title Patient will ambulate 220 ft on level indoor/outdoor surface modified independent with least restrictive assistive device vs no device.    Time 4   Period Weeks   Status New     PT SHORT TERM GOAL #3   Title Patient will improve TUG by 10% compared to value from second PT  visit in order to demonstrate decreasing fall risk.   Time 4   Period Weeks   Status New     PT SHORT TERM GOAL #4   Title Patient will improve his gait velocity by 20% compared to baseline from second PT visit in order to demonstrate increasing safety with community ambulation.    Time 4   Period Weeks   Status New           PT Long Term Goals - 03/11/17 1600      PT LONG TERM GOAL #1   Title Patient will demonstrate independence with updated HEP focusing on improving balance and gait. (TARGET for all LTGs 05/08/17)   Time 8   Period Weeks   Status New     PT LONG TERM GOAL #2   Title Patient will ambulate 500 ft modified independent with least restrictive assistive device over combination of level and unlevel terrain.    Time 8   Period Weeks   Status New     PT LONG TERM GOAL #3   Title Patient will improve TUG by 10% compared to value from 4 week assessment in order to demonstrate decreasing fall risk.   Time 8   Period Weeks   Status New     PT LONG TERM GOAL #4   Title Patient will improve his gait velocity by 20% compared to value at 4 week assessment in order to demonstrate increasing safety with community ambulation.   Time 8   Period Weeks   Status New               Plan - 03/11/17 1312    Clinical Impression Statement 61 yo presents with diagnosis of cervical dystonia and gait disturbance. Patient reports he falls on average one time per month. Sounds like staggering, imbalance, and at times feet "get tangled." Patient demonstrates abnormal gait, decreased balance, decreased coordination, decreased safety awareness with decreased  knowledge of safe use of DME, increased muscle tone, impaired sensation bil LEs, and postural dysfunction (related to cervical dystonia and spasms). Anticipate patient can benefit from skilled PT to reduce his fall risk through education related to use of DME, balance trainging, gait training, and estabilishing a HEP for balance.    Rehab Potential Fair   Clinical Impairments Affecting Rehab Potential decreased safety awareness   PT Frequency 1x / week  pt offered 2x/week, however due to long drive ([place]) he chose to come 1x/week   PT Duration 8 weeks  Patient hopeful for 4 weeks, agreed to reassess at  4 weeks and decide if he will continue   PT Treatment/Interventions ADLs/Self Care Home Management;DME Instruction;Gait training;Stair training;Functional mobility training;Therapeutic activities;Therapeutic exercise;Balance training;Neuromuscular re-education;Cognitive remediation;Patient/family education;Orthotic Fit/Training   PT Next Visit Plan Shirell Struthers goofed and ended eval after 30 minutes--get gait velocity and TUG. Educate on use of SBQC (he owns) vs other device. Give fall precautions handout. begin HEP for balance He's only coming 1x/wk for 4 wks (all he committed to, but agreed to consider progress at 4 weeks and continue for entire 8 weeks)   Consulted and Agree with Plan of Care Patient;Family member/caregiver  sister      Patient will benefit from skilled therapeutic intervention in order to improve the following deficits and impairments:  Abnormal gait, Decreased balance, Decreased coordination, Decreased knowledge of use of DME, Decreased safety awareness, Increased muscle spasms, Impaired flexibility, Impaired sensation, Impaired tone, Postural dysfunction, Pain (although pain will be monitored consistently during sessions, no  goals will be set to address pain)  Visit Diagnosis: Abnormal posture - Plan: PT plan of care cert/re-cert  Repeated falls - Plan: PT plan of care  cert/re-cert  Other symptoms and signs involving the nervous system - Plan: PT plan of care cert/re-cert  Other abnormalities of gait and mobility - Plan: PT plan of care cert/re-cert      G-Codes - 48/27/07 1605    Functional Assessment Tool Used (Outpatient Only) 5x sit to stand 16.25 sec (>12 sec indicative of incr fall risk; normative for age/sex of 8.4 sec; frequent falls and ambulates with minimal assistance to recover balance   Functional Limitation Mobility: Walking and moving around   Mobility: Walking and Moving Around Current Status ([id number]) At least 20 percent but less than 40 percent impaired, limited or restricted   Mobility: Walking and Moving Around Goal Status [phone]) At least 1 percent but less than 20 percent impaired, limited or restricted       Problem List Patient Active Problem List   Diagnosis Date Noted  . Paresthesia 08/07/2016  . Chronic migraine w/o aura w/o status migrainosus, not intractable 06/03/2016  . H/O arthrodesis 05/17/2014  . Paraplegia ([place])   . Gait disturbance   . Dysarthria   . Cervical dystonia   . Myelopathy ([place])     Rexanne Mano, PT 03/11/2017, 4:20 PM  [facility] [address] [place], [place], [address] Phone: [phone]   Fax:  [phone]  Name: Patrick Daniel MRN: [id number] Date of Birth: Sep 28, 1956

## 2017-03-20 ENCOUNTER — Ambulatory Visit: Payer: Medicare Other | Admitting: Physical Therapy

## 2017-03-20 ENCOUNTER — Encounter: Payer: Self-pay | Admitting: Physical Therapy

## 2017-03-20 DIAGNOSIS — R2689 Other abnormalities of gait and mobility: Secondary | ICD-10-CM | POA: Diagnosis not present

## 2017-03-20 DIAGNOSIS — R29818 Other symptoms and signs involving the nervous system: Secondary | ICD-10-CM

## 2017-03-20 DIAGNOSIS — R296 Repeated falls: Secondary | ICD-10-CM

## 2017-03-20 DIAGNOSIS — R293 Abnormal posture: Secondary | ICD-10-CM | POA: Diagnosis not present

## 2017-03-20 NOTE — Patient Instructions (Signed)
Always stand in a corner with your back to the walls, and a chair back in front of you.    Feet Apart - Eyes Closed   Stand on floor in a corner with your back to the walls, feet shoulder width, close eyes. Maintain balance 30 seconds. Repeat 3 times per set. Do 1 sets per session. Do 5 sessions per week. Repeat on compliant surface: foam.   Copyright  VHI. All rights reserved.  Feet Together, Head Motion - Eyes Closed      With eyes closed and feet together, Maintain balance 30 seconds. Repeat 3 times per set. Do 1 sets per session. Do 5 sessions per week.    Copyright  VHI. All rights reserved.  Feet Partial Heel-Toe, Head Motion - Eyes Closed   With eyes closed and right foot partially in front of the other, Maintain balance 30 seconds. Repeat 3 times per set. Switch your other foot forward and repeat. Do 1 sets per session. Do 5 sessions per week.

## 2017-03-20 NOTE — Therapy (Signed)
[facility] [address] [place], [place], [address] Phone: [phone]   Fax:  [phone]  Physical Therapy Treatment  Patient Details  Name: Patrick Daniel MRN: [id number] Date of Birth: 08-11-1956 Referring Provider: Dr. Marcial Pacas  Encounter Date: 03/20/2017      PT End of Session - 03/20/17 1601    Visit Number 2   Number of Visits 9   Date for PT Re-Evaluation 05/08/17   Authorization Time Period 03/10/17 to 05/08/17   PT Start Time 1445   PT Stop Time 1532   PT Time Calculation (min) 47 min   Activity Tolerance Patient tolerated treatment well   Behavior During Therapy [facility] for tasks assessed/performed      Past Medical History:  Diagnosis Date  . Dysarthria   . Dystonia   . Gait disturbance   . Myelopathy ([place])   . Paraplegia [facility])     Past Surgical History:  Procedure Laterality Date  . NECK SURGERY     dec 2014    There were no vitals filed for this visit.      Subjective Assessment - 03/20/17 1444    Subjective I want to clarify something I think I said that was misunderstood. Pt states he came to this clinic because of the "Neurological Rehab." He is not here to address his neck pain... he could have done that at a clinic 5 minutes from his home (he is driving 50 minutes from [place]). He reports he knows he walks better with his quad cane (and to be safe he always takes it with him on his 3 mile walks), however he carries the cane sometimes because he wants to learn to walk without it.    Patient Stated Goals Wants to feel well enough to travel; able to walk more easily on changing grades of terrain   Currently in Pain? No/denies            [facility] PT Assessment - 03/20/17 0001      Standardized Balance Assessment   Standardized Balance Assessment Timed Up and Go Test     Timed Up and Go Test   Normal TUG (seconds) 20.62  no device; 18.62 with SBQC                      [facility] Adult PT Treatment/Exercise - 03/20/17 0001      Transfers   Transfers Sit to Stand;Stand to Sit   Sit to Stand 6: Modified independent (Device/Increase time);With upper extremity assist;Without upper extremity assist   Stand to Sit 6: Modified independent (Device/Increase time);With upper extremity assist     Ambulation/Gait   Ambulation/Gait Yes   Ambulation/Gait Assistance 6: Modified independent (Device/Increase time);4: Min guard   Ambulation/Gait Assistance Details with SBQC modified independent; without cane min guard assist with pt nearly scissoring his feet and more unsteady   Ambulation Distance (Feet) 120 Feet  40, 40   Assistive device Small based quad cane;None   Gait Pattern Step-through pattern;Decreased arm swing - right;Decreased arm swing - left;Decreased stride length;Decreased step length - right;Right foot flat;Left foot flat;Poor foot clearance - left;Poor foot clearance - right;Left circumduction;Right flexed knee in stance;Left flexed knee in stance;Ataxic   Ambulation Surface Level   Gait velocity 23.62=1.39 ft/sec without cane  18.2= 1.80 ft/sec with [facility]     Posture/Postural Control   Posture/Postural Control Postural limitations   Postural Limitations Decreased thoracic kyphosis  very limited neck ROM  Balance Exercises - 03/20/17 1555      Balance Exercises: Standing   Standing Eyes Opened Narrow base of support (BOS);Wide (BOA);Solid surface;Foam/compliant surface;30 secs   Standing Eyes Closed Narrow base of support (BOS);Wide (BOA);Solid surface;Foam/compliant surface  at most 15 sec with EC; +postural reactions, but lots LOB   Tandem Stance Eyes open;Intermittent upper extremity support;30 secs  staggered stance   SLS Eyes open;Upper extremity support 1;2 reps;30 secs  one finger support   Rockerboard Lateral;EO;30 seconds;Intermittent UE support  frequent LOB using UEs to catch himself   Balance Beam black horizontal; good  use of hip strategy, however with frequent LOB; step off/on with alternate legs fwd/backward           PT Education - 03/20/17 1600    Education provided Yes   Education Details new HEP    Person(s) Educated Patient   Methods Explanation;Demonstration;Tactile cues;Verbal cues;Handout   Comprehension Verbalized understanding;Returned demonstration;Verbal cues required;Tactile cues required;Need further instruction          PT Short Term Goals - 03/20/17 1606      PT SHORT TERM GOAL #1   Title Patient will be independent with basic HEP focusing on improved balance and gait. (TARGET for all STGs 04/10/17)   Time 4   Period Weeks   Status New     PT SHORT TERM GOAL #2   Title Patient will ambulate 220 ft on level indoor/outdoor surface modified independent with least restrictive assistive device vs no device.    Time 4   Period Weeks   Status New     PT SHORT TERM GOAL #3   Title Patient will improve TUG by 10% compared to value from second PT visit in order to demonstrate decreasing fall risk.   Baseline 5/25 18.62 with cane (10% is 1.86 sec or 16.76 sec)   Time 4   Period Weeks   Status New     PT SHORT TERM GOAL #4   Title Patient will improve his gait velocity by 20% compared to baseline from second PT visit in order to demonstrate increasing safety with community ambulation.    Baseline 5/25  1.80 ft/sec (20% is 0.36 or 2.16 ft/sec)   Time 4   Period Weeks   Status New           PT Long Term Goals - 03/11/17 1600      PT LONG TERM GOAL #1   Title Patient will demonstrate independence with updated HEP focusing on improving balance and gait. (TARGET for all LTGs 05/08/17)   Time 8   Period Weeks   Status New     PT LONG TERM GOAL #2   Title Patient will ambulate 500 ft modified independent with least restrictive assistive device over combination of level and unlevel terrain.    Time 8   Period Weeks   Status New     PT LONG TERM GOAL #3   Title Patient  will improve TUG by 10% compared to value from 4 week assessment in order to demonstrate decreasing fall risk.   Time 8   Period Weeks   Status New     PT LONG TERM GOAL #4   Title Patient will improve his gait velocity by 20% compared to value at 4 week assessment in order to demonstrate increasing safety with community ambulation.   Time 8   Period Weeks   Status New  Plan - 03/20/17 1602    Clinical Impression Statement Session focused on gait with and without SBQC, assessing gait velocity and TUG (which indicated pt is at incr risk of falls), and establishing HEP for balance. Discussed that pt is much more safe with his [facility] and he acknowledges this, however he wants to continue to walk without it at times. Will update goals and anticipate pt will progress towards goals   Rehab Potential Fair   Clinical Impairments Affecting Rehab Potential decreased safety awareness   PT Frequency 1x / week  pt offered 2x/week, however due to long drive ([place]) he chose to come 1x/week   PT Duration 8 weeks  Patient hopeful for 4 weeks, agreed to reassess at  4 weeks and decide if he will continue   PT Treatment/Interventions ADLs/Self Care Home Management;DME Instruction;Gait training;Stair training;Functional mobility training;Therapeutic activities;Therapeutic exercise;Balance training;Neuromuscular re-education;Cognitive remediation;Patient/family education;Orthotic Fit/Training   PT Next Visit Plan Give fall precautions handout. check HEP from 5/25 and add to HEP for balance (?toe walking, heel walking, marching fwd/bwd) He's only coming 1x/wk for 4 wks (all he committed to, but agreed to consider progress at 4 weeks and continue for entire 8 weeks)   Consulted and Agree with Plan of Care Patient;Family member/caregiver  sister      Patient will benefit from skilled therapeutic intervention in order to improve the following deficits and impairments:  Abnormal gait,  Decreased balance, Decreased coordination, Decreased knowledge of use of DME, Decreased safety awareness, Increased muscle spasms, Impaired flexibility, Impaired sensation, Impaired tone, Postural dysfunction, Pain (although pain will be monitored consistently during sessions, no goals will be set to address pain)  Visit Diagnosis: Abnormal posture  Repeated falls  Other symptoms and signs involving the nervous system  Other abnormalities of gait and mobility     Problem List Patient Active Problem List   Diagnosis Date Noted  . Paresthesia 08/07/2016  . Chronic migraine w/o aura w/o status migrainosus, not intractable 06/03/2016  . H/O arthrodesis 05/17/2014  . Paraplegia ([place])   . Gait disturbance   . Dysarthria   . Cervical dystonia   . Myelopathy ([place])     Rexanne Mano, PT 03/20/2017, 4:12 PM  [facility] [address] [place], [place], [address] Phone: [phone]   Fax:  [phone]  Name: Patrick Daniel MRN: [id number] Date of Birth: Mar 15, 1956

## 2017-03-27 ENCOUNTER — Encounter: Payer: Self-pay | Admitting: Physical Therapy

## 2017-03-27 ENCOUNTER — Ambulatory Visit: Payer: Medicare Other | Attending: Neurology | Admitting: Physical Therapy

## 2017-03-27 DIAGNOSIS — R29818 Other symptoms and signs involving the nervous system: Secondary | ICD-10-CM

## 2017-03-27 DIAGNOSIS — R296 Repeated falls: Secondary | ICD-10-CM | POA: Diagnosis not present

## 2017-03-27 DIAGNOSIS — R293 Abnormal posture: Secondary | ICD-10-CM | POA: Diagnosis not present

## 2017-03-27 DIAGNOSIS — R2689 Other abnormalities of gait and mobility: Secondary | ICD-10-CM | POA: Diagnosis not present

## 2017-03-27 NOTE — Patient Instructions (Addendum)
Single Leg - Eyes Open    Holding support, lift right leg while maintaining balance over other leg. Progress to removing hands from support surface for longer periods of time. Hold__10-30__ seconds. Repeat __5__ times per session. Do each leg. Do __1-2__ sessions per day.  Copyright  VHI. All rights reserved.  Walking on Toes    Walk on toes for _length of kitchen while continuing on a straight path.  Do _5___ lengths per day.  Copyright  VHI. All rights reserved.

## 2017-03-27 NOTE — Therapy (Signed)
[facility] [address] [place], [place], [address] Phone: [phone]   Fax:  [phone]  Physical Therapy Treatment  Patient Details  Name: Patrick Daniel MRN: [id number] Date of Birth: [birth year] Referring Provider: Dr. Marcial Pacas  Encounter Date: 03/27/2017      PT End of Session - 03/27/17 1431    Visit Number 3   Number of Visits 9   Date for PT Re-Evaluation 05/08/17   Authorization Time Period 03/10/17 to 05/08/17   PT Start Time 1314   PT Stop Time 1358   PT Time Calculation (min) 44 min   Equipment Utilized During Treatment --  used pt's belt   Activity Tolerance Patient tolerated treatment well   Behavior During Therapy Impulsive      Past Medical History:  Diagnosis Date  . Dysarthria   . Dystonia   . Gait disturbance   . Myelopathy ([place])   . Paraplegia [facility])     Past Surgical History:  Procedure Laterality Date  . NECK SURGERY     dec 2014    There were no vitals filed for this visit.      Subjective Assessment - 03/27/17 1312    Subjective Holding exercises with eyes closed for up to 17 seconds. Denies falling.    Patient Stated Goals Wants to feel well enough to travel; able to walk more easily on changing grades of terrain   Currently in Pain? No/denies                              Balance Exercises - 03/27/17 1423      Balance Exercises: Standing   Standing Eyes Opened Narrow base of support (BOS);Wide (BOA);Head turns;Foam/compliant surface  head turns limited by incr tone; uni & bilat UE mvmnts   Standing Eyes Closed Wide (BOA);Foam/compliant surface   SLS Eyes open;Upper extremity support 1;Intermittent upper extremity support;5 reps  less than 10 sec   SLS with Vectors Solid surface  taps to foam bubbles x 5 minutes with multiple LOB    Sidestepping 4 reps  at [place] Walking Support   Toe Walk  Support  multiple LOB           PT Education - 03/27/17 1428    Education provided Yes   Education Details additions to HEP; when beginning to lose his balance he needs to STOP, regroup and then start again; fall prevention handout   Person(s) Educated Patient   Methods Explanation;Demonstration;Tactile cues;Verbal cues;Handout   Comprehension Verbalized understanding;Returned demonstration;Verbal cues required;Tactile cues required;Need further instruction          PT Short Term Goals - 03/20/17 1606      PT SHORT TERM GOAL #1   Title Patient will be independent with basic HEP focusing on improved balance and gait. (TARGET for all STGs 04/10/17)   Time 4   Period Weeks   Status New     PT SHORT TERM GOAL #2   Title Patient will ambulate 220 ft on level indoor/outdoor surface modified independent with least restrictive assistive device vs no device.    Time 4   Period Weeks   Status New     PT SHORT TERM GOAL #3   Title Patient will improve TUG by 10% compared to value from second PT visit in order to demonstrate decreasing fall risk.  Baseline 5/25 18.62 with cane (10% is 1.86 sec or 16.76 sec)   Time 4   Period Weeks   Status New     PT SHORT TERM GOAL #4   Title Patient will improve his gait velocity by 20% compared to baseline from second PT visit in order to demonstrate increasing safety with community ambulation.    Baseline 5/25  1.80 ft/sec (20% is 0.36 or 2.16 ft/sec)   Time 4   Period Weeks   Status New           PT Long Term Goals - 03/11/17 1600      PT LONG TERM GOAL #1   Title Patient will demonstrate independence with updated HEP focusing on improving balance and gait. (TARGET for all LTGs 05/08/17)   Time 8   Period Weeks   Status New     PT LONG TERM GOAL #2   Title Patient will ambulate 500 ft modified independent with least restrictive assistive device over combination of level and unlevel terrain.    Time 8   Period Weeks   Status New      PT LONG TERM GOAL #3   Title Patient will improve TUG by 10% compared to value from 4 week assessment in order to demonstrate decreasing fall risk.   Time 8   Period Weeks   Status New     PT LONG TERM GOAL #4   Title Patient will improve his gait velocity by 20% compared to value at 4 week assessment in order to demonstrate increasing safety with community ambulation.   Time 8   Period Weeks   Status New               Plan - 03/27/17 1432    Clinical Impression Statement Session focused on reviewing HEP as previously assigned and balance training. Patient with numerous LOB with various activities where he would use short, quick steps to try to catch himself and required min to max assist by PT to prevent falls. Educated patient in importance of regaining his balance before proceeding with next rep and part of his PT is demonstrating good safety awareness. He acknowledged at times he knew PT was going to catch him, so he wanted to try to see if he could recover. Patient continues to carry his [facility] despite recommenations to utilize it for decreased fall risk.    Rehab Potential Fair   Clinical Impairments Affecting Rehab Potential decreased safety awareness   PT Frequency 1x / week  pt offered 2x/week, however due to long drive ([place]) he chose to come 1x/week   PT Duration 8 weeks  Patient hopeful for 4 weeks, agreed to reassess at  4 weeks and decide if he will continue   PT Treatment/Interventions ADLs/Self Care Home Management;DME Instruction;Gait training;Stair training;Functional mobility training;Therapeutic activities;Therapeutic exercise;Balance training;Neuromuscular re-education;Cognitive remediation;Patient/family education;Orthotic Fit/Training   PT Next Visit Plan Balance activities (he can't really do head turns as increases his neck tone) and encouraging him to stop as soon as he begins to lose his balance, regroup, and restart activity.  He's only coming 1x/wk  for 4 wks (all he committed to, but agreed to consider progress at 4 weeks and continue for entire 8 weeks)   Consulted and Agree with Plan of Care Patient      Patient will benefit from skilled therapeutic intervention in order to improve the following deficits and impairments:  Abnormal gait, Decreased balance, Decreased coordination, Decreased knowledge of use of DME,  Decreased safety awareness, Increased muscle spasms, Impaired flexibility, Impaired sensation, Impaired tone, Postural dysfunction, Pain (although pain will be monitored consistently during sessions, no goals will be set to address pain)  Visit Diagnosis: Repeated falls  Other symptoms and signs involving the nervous system  Other abnormalities of gait and mobility     Problem List Patient Active Problem List   Diagnosis Date Noted  . Paresthesia 08/07/2016  . Chronic migraine w/o aura w/o status migrainosus, not intractable 06/03/2016  . H/O arthrodesis 05/17/2014  . Paraplegia ([place])   . Gait disturbance   . Dysarthria   . Cervical dystonia   . Myelopathy ([place])     Rexanne Mano, PT 03/27/2017, 2:42 PM  [facility] [address] [place], [place], [address] Phone: [phone]   Fax:  [phone]  Name: Patrick Daniel MRN: [id number] Date of Birth: 1956-02-06

## 2017-03-30 ENCOUNTER — Ambulatory Visit: Payer: Medicare Other | Admitting: Physical Therapy

## 2017-04-09 ENCOUNTER — Ambulatory Visit: Payer: Medicare Other | Admitting: Physical Therapy

## 2017-04-16 ENCOUNTER — Encounter: Payer: Self-pay | Admitting: Physical Therapy

## 2017-04-16 ENCOUNTER — Ambulatory Visit: Payer: Medicare Other | Admitting: Physical Therapy

## 2017-04-16 DIAGNOSIS — R296 Repeated falls: Secondary | ICD-10-CM | POA: Diagnosis not present

## 2017-04-16 DIAGNOSIS — R2689 Other abnormalities of gait and mobility: Secondary | ICD-10-CM

## 2017-04-16 DIAGNOSIS — R29818 Other symptoms and signs involving the nervous system: Secondary | ICD-10-CM | POA: Diagnosis not present

## 2017-04-16 DIAGNOSIS — R293 Abnormal posture: Secondary | ICD-10-CM | POA: Diagnosis not present

## 2017-04-16 NOTE — Therapy (Signed)
[facility] [address] [place], [place], [address] Phone: [phone]   Fax:  [phone]  Physical Therapy Treatment and Discharge Summary  Patient Details  Name: Patrick Daniel MRN: [id number] Date of Birth: 1956-07-20 Referring Provider: Dr. Marcial Pacas  Encounter Date: 04/16/2017      PT End of Session - 04/16/17 1645    Visit Number 4   Number of Visits 9   Date for PT Re-Evaluation 05/08/17   Authorization Time Period 03/10/17 to 05/08/17   PT Start Time 8032   PT Stop Time 1404   PT Time Calculation (min) 47 min   Equipment Utilized During Treatment --  used pt's belt   Activity Tolerance Patient tolerated treatment well   Behavior During Therapy Impulsive      Past Medical History:  Diagnosis Date  . Dysarthria   . Dystonia   . Gait disturbance   . Myelopathy ([place])   . Paraplegia [facility])     Past Surgical History:  Procedure Laterality Date  . NECK SURGERY     dec 2014    There were no vitals filed for this visit.      Subjective Assessment - 04/16/17 1630    Subjective Wants to know what exercises he can do to make his balance better. States he doesn't think PT is helping and wants to wrap things up today (this is 4th week).   Patient Stated Goals Wants to feel well enough to travel; able to walk more easily on changing grades of terrain   Currently in Pain? No/denies            [facility] PT Assessment - 04/16/17 1333      Ambulation/Gait   Gait velocity 32.8/20.84=1.57 ft/sec; 32.8/20.63=1.60 ft/sec with SBQC     Timed Up and Go Test   Normal TUG (seconds) 20.62  no device; with SBQC 21.53                     [facility] Adult PT Treatment/Exercise - 04/16/17 1333      Ambulation/Gait   Ambulation/Gait Yes   Ambulation/Gait Assistance 6: Modified independent (Device/Increase time)   Ambulation/Gait Assistance Details remains very ataxic/spastic and only intermittently uses  SBQC (carries it at times); staggering LOB with independent recovery   Ambulation Distance (Feet) 120 Feet  120   Assistive device Small based quad cane   Gait Pattern Step-through pattern;Decreased arm swing - right;Decreased arm swing - left;Decreased stride length;Decreased step length - right;Right foot flat;Left foot flat;Poor foot clearance - left;Poor foot clearance - right;Left circumduction;Right flexed knee in stance;Left flexed knee in stance;Ataxic   Ambulation Surface Level     Exercises   Exercises Lumbar;Ankle     Lumbar Exercises: Seated   Other Seated Lumbar Exercises on physioball-alternating shoulder flexion, heel raises with multiple LOB and needing min assist to recover     Lumbar Exercises: Quadruped   Opposite Arm/Leg Raise Right arm/Left leg;Left arm/Right leg;5 seconds  3 reps ea   Opposite Arm/Leg Raise Limitations poor balance requiring min assist to prevent falling over; when asked if he realized he was falling he stated "yea, but I knew it wasn't far and I woundlnt get hurt"   Other Quadruped Lumbar Exercises tall kneeling to 1/2 kneeling with min UE support x 2 and no UE support x 1 bil      Ankle Exercises: Standing   Heel Raises 5 reps   Heel Raises Limitations pt holds for  30 seconds; progressed from 2 hand support to single UE using 2 fingers support                PT Education - 04/16/17 1644    Education provided Yes   Education Details see additions to HEP; reviewed his corner balance exercises and how to progress to incr difficulty (see instructions); how to make some of his other ex's also challenge his balance (see heel raises)   Person(s) Educated Patient   Methods Explanation;Demonstration;Verbal cues;Handout   Comprehension Verbalized understanding;Returned demonstration;Verbal cues required          PT Short Term Goals - 04/16/17 1647      PT SHORT TERM GOAL #1   Title Patient will be independent with basic HEP focusing on  improved balance and gait. (TARGET for all STGs 04/10/17)   Time 4   Period Weeks   Status Achieved     PT SHORT TERM GOAL #2   Title Patient will ambulate 220 ft on level indoor/outdoor surface modified independent with least restrictive assistive device vs no device.    Baseline 6/21 120 ft   Time 4   Period Weeks   Status Partially Met     PT SHORT TERM GOAL #3   Title Patient will improve TUG by 10% compared to value from second PT visit in order to demonstrate decreasing fall risk.   Baseline 5/25 18.62 with cane (10% is 1.86 sec or 16.76 sec); 6/21 21.53 with cane   Time 4   Period Weeks   Status Not Met     PT SHORT TERM GOAL #4   Title Patient will improve his gait velocity by 20% compared to baseline from second PT visit in order to demonstrate increasing safety with community ambulation.    Baseline 5/25  1.80 ft/sec (20% is 0.36 or 2.16 ft/sec); 6/21 1.60 ft/sec    Time 4   Period Weeks   Status Not Met           PT Long Term Goals - 04/16/17 1651      PT LONG TERM GOAL #1   Title Patient will demonstrate independence with updated HEP focusing on improving balance and gait. (TARGET for all LTGs 05/08/17)   Time 8   Period Weeks   Status Unable to assess     PT LONG TERM GOAL #2   Title Patient will ambulate 500 ft modified independent with least restrictive assistive device over combination of level and unlevel terrain.    Time 8   Period Weeks   Status Unable to assess     PT LONG TERM GOAL #3   Title Patient will improve TUG by 10% compared to value from 4 week assessment in order to demonstrate decreasing fall risk.   Time 8   Period Weeks   Status Unable to assess     PT LONG TERM GOAL #4   Title Patient will improve his gait velocity by 20% compared to value at 4 week assessment in order to demonstrate increasing safety with community ambulation.   Time 8   Period Weeks   Status Unable to assess               Plan - 04/16/17 1647     Clinical Impression Statement STGs assessed with pt meeting/partially meeting 2 of 4 goals and did not meet 2 of 4 goals. Partially met goal was met with respect to level of assistance, however did not meet distance. Goals  not met were related to gait velocity and pt walking slower today than on eval. Pt reported he just feels more stiff today, however even after stretching, his velocity remained slower. Patient reports continued difficulty with corner balance exercises, however wanted any additional exercises he could do for his balance (see pt instructions). Pt requesting to discharge from PT as he does not feel it is helping him.    Rehab Potential Fair   Clinical Impairments Affecting Rehab Potential decreased safety awareness   PT Frequency 1x / week  pt offered 2x/week, however due to long drive ([place]) he chose to come 1x/week   PT Duration 8 weeks  Patient hopeful for 4 weeks, agreed to reassess at  4 weeks and decide if he will continue   PT Treatment/Interventions --   PT Next Visit Plan --   Consulted and Agree with Plan of Care Patient      Patient will benefit from skilled therapeutic intervention in order to improve the following deficits and impairments:   (although pain will be monitored consistently during sessions, no goals will be set to address pain)  Visit Diagnosis: Repeated falls  Other symptoms and signs involving the nervous system  Other abnormalities of gait and mobility  Abnormal posture       G-Codes - 05/06/2017 1659    Functional Assessment Tool Used (Outpatient Only) TUG 21.53 seconds with SBQC (>13.5 sec indicative of high fall risk)   Functional Limitation Mobility: Walking and moving around   Mobility: Walking and Moving Around Goal Status [phone]) At least 1 percent but less than 20 percent impaired, limited or restricted   Mobility: Walking and Moving Around Discharge Status [phone]) At least 20 percent but less than 40 percent impaired, limited or  restricted      Problem List Patient Active Problem List   Diagnosis Date Noted  . Paresthesia 08/07/2016  . Chronic migraine w/o aura w/o status migrainosus, not intractable 06/03/2016  . H/O arthrodesis 05/17/2014  . Paraplegia ([place])   . Gait disturbance   . Dysarthria   . Cervical dystonia   . Myelopathy ([place])    PHYSICAL THERAPY DISCHARGE SUMMARY  Visits from Start of Care: 4  Current functional level related to goals / functional outcomes: Modified independent with intermittent use of SBQC;    Remaining deficits: remains high fall risk due to ataxic gait   Education / Equipment: HEP  Plan: Patient agrees to discharge.  Patient goals were partially met. Patient is being discharged due to the patient's request.  ?????                      [employer] PT May 06, 2017, 5:00 PM  [facility] [address] [place], [place], [address] Phone: [phone]   Fax:  [phone]  Name: Estiven Kohan MRN: [id number] Date of Birth: 09/18/1956

## 2017-04-16 NOTE — Patient Instructions (Signed)
Arm / Leg Extension: Alternate (All-Fours)    Raise right arm and opposite leg. Do not arch neck. Repeat __20__ times per set. Do ___1_ sets per session. Do ___1_ sessions per day.  http://orth.exer.us/110   Copyright  VHI. All rights reserved.   DEVELOPMENTAL POSITION: Tall Kneeling to Half Kneeling    From tall kneeling position, shift weight to one side. Bring opposite leg forward, place foot flat on surface. _10__ reps per set, _1__ sets per day, __5_ days per week Repeat with other leg.  Copyright  VHI. All rights reserved.    Think about using a physioball for additional balance exercises.

## 2017-04-23 ENCOUNTER — Ambulatory Visit: Payer: Medicare Other | Admitting: Physical Therapy

## 2017-04-30 ENCOUNTER — Ambulatory Visit: Payer: Medicare Other | Admitting: Physical Therapy

## 2017-05-07 ENCOUNTER — Ambulatory Visit: Payer: Medicare Other | Admitting: Physical Therapy

## 2017-05-14 ENCOUNTER — Other Ambulatory Visit: Payer: Self-pay | Admitting: Neurology

## 2017-05-18 DIAGNOSIS — S0181XA Laceration without foreign body of other part of head, initial encounter: Secondary | ICD-10-CM | POA: Diagnosis not present

## 2017-05-18 DIAGNOSIS — R2689 Other abnormalities of gait and mobility: Secondary | ICD-10-CM | POA: Diagnosis not present

## 2017-05-18 DIAGNOSIS — S0990XA Unspecified injury of head, initial encounter: Secondary | ICD-10-CM | POA: Diagnosis not present

## 2017-05-18 DIAGNOSIS — S0191XA Laceration without foreign body of unspecified part of head, initial encounter: Secondary | ICD-10-CM | POA: Diagnosis not present

## 2017-05-18 DIAGNOSIS — R51 Headache: Secondary | ICD-10-CM | POA: Diagnosis not present

## 2017-05-18 DIAGNOSIS — S0101XA Laceration without foreign body of scalp, initial encounter: Secondary | ICD-10-CM | POA: Diagnosis not present

## 2017-05-18 DIAGNOSIS — S199XXA Unspecified injury of neck, initial encounter: Secondary | ICD-10-CM | POA: Diagnosis not present

## 2017-05-18 DIAGNOSIS — R296 Repeated falls: Secondary | ICD-10-CM | POA: Diagnosis not present

## 2017-05-20 ENCOUNTER — Telehealth: Payer: Self-pay | Admitting: Neurology

## 2017-05-20 ENCOUNTER — Ambulatory Visit (INDEPENDENT_AMBULATORY_CARE_PROVIDER_SITE_OTHER): Payer: Medicare Other | Admitting: Neurology

## 2017-05-20 ENCOUNTER — Encounter: Payer: Self-pay | Admitting: Neurology

## 2017-05-20 VITALS — BP 129/68 | HR 72 | Ht 66.0 in | Wt 142.0 lb

## 2017-05-20 DIAGNOSIS — G243 Spasmodic torticollis: Secondary | ICD-10-CM | POA: Diagnosis not present

## 2017-05-20 DIAGNOSIS — G8220 Paraplegia, unspecified: Secondary | ICD-10-CM

## 2017-05-20 MED ORDER — INCOBOTULINUMTOXINA 100 UNITS IM SOLR
300.0000 [IU] | INTRAMUSCULAR | Status: DC
  Administered 2017-05-20: 300 [IU] via INTRAMUSCULAR

## 2017-05-20 NOTE — Progress Notes (Signed)
**  Xeomin 100 units x 3 vials, NDC 9449-6759-16, Lot [id number], Exp 06/2019, office supply.//mck,rn**

## 2017-05-20 NOTE — Telephone Encounter (Signed)
Pt returned Danielle's call °

## 2017-05-20 NOTE — Telephone Encounter (Signed)
I called to schedule the patient for his next injection. He did not answer so I left a VM asking him to call me back.

## 2017-05-20 NOTE — Progress Notes (Signed)
[facility]  PATIENT: Patrick Daniel DOB: 09-27-1956   REASON FOR VISIT: Follow-up for paraplegia, cervical dystonia gait disturbance, dysarthria HISTORY FROM: Patient    HISTORY OF PRESENT ILLNESS:HISTORY: Patrick Daniel return to evaluation for his continued gait difficulty  Patrick Daniel is a 61 year old right-handed Caucasian male. He has past medical history of hyperlipidemia, depression, He presented with gradual onset language difficulty, gait difficulty since 2009. He was evaluated by [name] neurologist at [name], reported normal MRI of brain, he was diagnosed with cervical dystonia, was given Botox injection of the cervical muscles, which has improved his symptoms temporarily.  He moved back to the [name] in April 2012, began to seek medical attention, initially evaluated by [name], reported again normal MRI of brain, also had trial of Haldol and baclofen without much improvement.  Sinemet did not help him. Artane and baclofen provide no help either. 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 He was evaluated by local neurosurgeon [name] at [facility], Because concomitant central nervous system degenerative disorder, cervical dystonia, he is hesitate to perform decompression surgery. I later referred him to [name] neurology, evaluated by [name], who has suggested Botox injection to alleviate his cervical dystonia symptoms first, may consider cervical decompression surgery later. Also, likely there is a component of central nervous system degenerative disorder, such as spinocerebellar ataxia,.  He has significant dysarthria, also dysphagia, gait difficulty, frequent falling, he denies sensory changes. Normal or  negative CMP, PEP, CBC, HIV, Lyme disease, SSA/B, TSH, ACE, RPR, CRP, CPK. He received first EMG guided Botox injection since March 2012, he reported great improvement, he has been receiving BOTOX injection every 3 months, there is significant improvement of his abnormal neck postural, but still with severe retrocollis, left turn, right tilt. he began to have moderate improvement after repeat injection. He was evaluated by his neurosurgeon [name] at [name], repeat MRI cervical showed advanced multilevel degenerative changes of the cervical spine, most pronounced at C5-C6, where there is severe central stenosis, impression on the cord with focal myelomalacia and severe bilateral neuroforaminal narrowing. Overall appearance is similar to prior study from 09/18/2010. He had posterior decompression and fusion surgery in December 2013, recovering well, but complains of neck muscle spasm, and chest muscle spasm continued spastic gait difficulty, occasional urinary incontinence, his posture is much improved, but has limited range of motion of his neck  UPDATE June 27th 2016:YYHe had EMG guided BOTOX injection by me since March 2012, most recent EMG guided Botox injection was by [name] April 10 2015,300 units of Botox A Today he came in for neurological evaluation for continued unsteady gait, difficulty finding a comfortable position, urinary urgency, he denies constipation, denies upper extremity paresthesia or weakness, slow worsening dysarthria, Overall, is continued slow progression compared to his baseline, UPDATE August 1st 2016: He is with his sister at today's clinical visit, he continue complains significant gait difficulty, slow worsening dysarthria, abnormal neck posturing, tendency to turn towards the left side,  We have reviewed MRI of the brain, he has out generalized atrophy, prominent superior vermis atrophy, out of proportion to other part of the brain, MRI of cervical spine,  posterior surgical change at C4, C6, and C2-T2 posterior fusion, metal artifact, multilevel degenerative changes, most significant at C5-6, causing mild canal stenosis,  no evidence of cord compression  Update August 07 2016: He was brought in by her sister but alone at today's clinical visit, he has been receiving EMG guided Botox a injection by [name], last injection was on July 25 2016, received total 300 units  Patient reported that overall his cervical dystonia symptoms is stable, but he is here today concerned worsening bilateral feet lower extremity paresthesia, this started more than 5 years ago, initially involving his feet now progressively getting worse, left more than right, to bilateral leg now,  He continue to complains of unsteady gait, tendency to fall, urinary and bowel urgency  I have personally reviewed MRI seen 2016, MRI of the brain showed generalized atrophy, most prominent in vermis,  genetic or degenerative processes that may present with both ataxia and dystonia such as the spinal cerebellar ataxias and OPCA.   Additional note is made of a few small T2/flair hyperintense foci consistent with minimal small vessel ischemic changes. MRI of cervical spine: Prior C4-C6 ACDF and C2-T2 posterior fusion. The vertebral bodies are normally aligned.   Metal artifact from these fusions obscures many of the images. Multilevel cervical degenerative disc disease, most significant findings are at C5-C6. There appears to be a posterior bony ridge that mildly narrows the neural canal but does not appear to be causing spinal cord compression. However, there is possible foraminal narrowing to the right that could lead to some right C6 nerve root impingement.     UPDATE Jan 10th 2018: He is here for repeat EMG guided xeomin injection for his cervical dystonia, we used 200 units today, he continue complains of bilateral lower extremity paresthesia, increased gait abnormality,  We have  personally reviewed MRI lumbar in December 2017 no significant abnormality. MRI of the brain in 2016, significant superior cerebella vermis atrophy, he reported long history of heavy alcohol abuse for more than 20 years,  MRI of the cervical spine in 2016, posterior cervical 4-6 decompression and fusion, no significant canal stenosis.  He is taking clonazepam 0.5 milligrams 1-2 tablets as needed for sleeping, tramadol as needed for pain, he continue complains of neck pain, limited range of motion  Update February 11 2017: He tolerated the injection well in January, no significant side effect notice, Cymbalta has helped his neck pain  He complains of increased gait abnormality will refer him to physical therapy today, hope PT can also help his neck pain  REVIEW OF SYSTEMS: Full 14 system review of systems performed and notable only for those listed, all others are neg:  Muscle cramps, walking difficulty, neck stiffness, numbness, speech difficulty, tremors   ALLERGIES: Allergies  Allergen Reactions  . Baclofen     Weakness, drowsiness    HOME MEDICATIONS: Outpatient Medications Prior to Visit  Medication Sig Dispense Refill  . aspirin 325 MG tablet Take by mouth.    . clonazePAM (KLONOPIN) 0.5 MG tablet TAKE 1 TO 2 TABLETS BY MOUTH EVERY NIGHT AT BEDTIME FOR INSOMNIA 50 tablet 5  . DULoxetine (CYMBALTA) 60 MG capsule Take 1 capsule (60 mg total) by mouth daily. 30 capsule 11  . IncobotulinumtoxinA (XEOMIN IM) Inject into the muscle every 3 (three) months.     . loratadine (CLARITIN) 10 MG tablet Take 10 mg by mouth daily as needed for allergies.    . mometasone (ELOCON) 0.1 % cream Apply 1 application topically daily as needed.     . Multiple Vitamins tablet Take 1 tablet by mouth.    . traMADol Veatrice Bourbon)  50 MG tablet Take 1 tablet (50 mg total) by mouth 3 (three) times daily. TID prn 90 tablet 5  . incobotulinumtoxinA (XEOMIN) 100 units injection 200 Units     . incobotulinumtoxinA  (XEOMIN) 100 units injection 300 Units      No facility-administered medications prior to visit.     PAST MEDICAL HISTORY: Past Medical History:  Diagnosis Date  . Dysarthria   . Dystonia   . Gait disturbance   . Myelopathy ([place])   . Paraplegia ([name])     PAST SURGICAL HISTORY: Past Surgical History:  Procedure Laterality Date  . NECK SURGERY     dec 2014    FAMILY HISTORY: Family History  Problem Relation Age of Onset  . Heart disease Mother   . Cancer Mother   . Heart Problems Father   . High Cholesterol Sister   . High blood pressure Sister   . Diabetes Sister   . High blood pressure Sister     SOCIAL HISTORY: Social History   Social History  . Marital status: Single    Spouse name: N/A  . Number of children: 1  . Years of education: N/A   Occupational History  . retired     Therapist, art   Social History Main Topics  . Smoking status: Former Smoker    Types: Cigarettes    Quit date: 02/01/2013  . Smokeless tobacco: Never Used     Comment: quit   . Alcohol use No  . Drug use: No  . Sexual activity: Not on file   Other Topics Concern  . Not on file   Social History Narrative   Patient lives in independent living [place]. Patient is retired from [employer]. Patient has one child. Right handed. Patient is divorced.   Caffeine- 12 - Coffee daily - one daily -soda     PHYSICAL EXAM  Vitals:   05/20/17 1357  BP: 129/68  Pulse: 72  Weight: 142 lb (64.4 kg)  Height: 5\' 6"  (1.676 m)   Body mass index is 22.92 kg/m. Generalized: Well developed, in no acute distress  Head: normocephalic and atraumatic,. Oropharynx benign  Neck: Severe limited range of motion to the neck, difficulty turning to the right side mouth right shoulder elevationHe has mild left shoulder anterior shift, mild left shoulder elevation, moderate retrocollis, mild left tilt, right turn  Cardiac: Regular rate rhythm, no murmur  Musculoskeletal: No deformity   Neurological  examination   Mentation: Alert oriented to time, place, history taking. Attention span and concentration appropriate. Recent and remote memory intact. Follows all commands, he has mild spastic dysarthria.   Cranial nerve II-XII: Pupils were equal round reactive to light extraocular movements were full, visual field were full on confrontational test. Facial sensation and strength were normal. hearing was intact to finger rubbing bilaterally. Uvula tongue midline. head turning and shoulder shrug were normal and symmetric.Tongue protrusion into cheek strength was normal. Motor: normal bulk and tone, full strength in the BUE, he has moderate bilateral lower extremity spasticity No focal weakness Sensory: normal and symmetric to light touch, pinprick, and Vibration,  Coordination: finger-nose-finger, heel-to-shin bilaterally, no dysmetria Reflexes: 3+ upper lower and symmetric, plantar responses were extensor bilaterally. Gait and Station: Rising up from seated position with pushoff , Stiff, cautious, unsteady gait  DIAGNOSTIC DATA (LABS, IMAGING, TESTING) -  ASSESSMENT AND PLAN 61 y.o. 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 Evidence of superior cerebellar vermis atrophy consistent with his history of lumbar alcohol abuse  Refer to [facility] per his request  Cervical dystonia:  Limited range of motion of neck flexion and extension, turning, mild left shoulder elevation, retrocollis, moderate left turn,  Under EMG guidance used  300 units of xeomin  Left inferior oblique capitis 25 units Right inferior oblique capitis 25 units Left splenius capitis 25 units Left splenius cervix 25 units  Left longissimus capitis 25 units  Right sternocleidomastoid 25 units Right iliocostalis 25 units Left levator scapular 25 units  Right inferior  oblique capitis 25 units Left scalenous posterior 25 units Left illiocostallis 25 units Right longissimus capitis 25 units  Marcial Pacas, M.D. Ph.D.  [facility] [place], [place] [address] Phone: [phone] Fax:      [phone]

## 2017-05-20 NOTE — Addendum Note (Signed)
Addended by: Marcial Pacas on: 05/20/2017 04:08 PM   Modules accepted: Orders

## 2017-05-21 NOTE — Telephone Encounter (Signed)
Pt has called back asking to speak with Andee Poles, please call pt back on Monday

## 2017-05-25 NOTE — Telephone Encounter (Signed)
I called to schedule patient for his Xeomin injection, he did not answer so I left a VM asking him to call me back.

## 2017-05-26 NOTE — Telephone Encounter (Signed)
I scheduled patient for Xeomin injection with Dr. Krista Blue on 08-26-17 @ 3:30pm.

## 2017-05-27 NOTE — Telephone Encounter (Signed)
Noted, thank you so much Shirley!  °

## 2017-05-28 DIAGNOSIS — Z139 Encounter for screening, unspecified: Secondary | ICD-10-CM | POA: Diagnosis not present

## 2017-05-28 DIAGNOSIS — M79609 Pain in unspecified limb: Secondary | ICD-10-CM | POA: Diagnosis not present

## 2017-05-28 DIAGNOSIS — F3160 Bipolar disorder, current episode mixed, unspecified: Secondary | ICD-10-CM | POA: Diagnosis not present

## 2017-05-28 DIAGNOSIS — W19XXXA Unspecified fall, initial encounter: Secondary | ICD-10-CM | POA: Diagnosis not present

## 2017-05-28 DIAGNOSIS — Z1389 Encounter for screening for other disorder: Secondary | ICD-10-CM | POA: Diagnosis not present

## 2017-05-28 DIAGNOSIS — Z1379 Encounter for other screening for genetic and chromosomal anomalies: Secondary | ICD-10-CM | POA: Diagnosis not present

## 2017-05-28 DIAGNOSIS — Y92009 Unspecified place in unspecified non-institutional (private) residence as the place of occurrence of the external cause: Secondary | ICD-10-CM | POA: Diagnosis not present

## 2017-05-28 DIAGNOSIS — S0101XD Laceration without foreign body of scalp, subsequent encounter: Secondary | ICD-10-CM | POA: Diagnosis not present

## 2017-06-04 ENCOUNTER — Telehealth: Payer: Self-pay | Admitting: Neurology

## 2017-06-04 NOTE — Telephone Encounter (Signed)
Pt calling to inform that he is aware that a referral was being set up for him to go to [facility]. Pt said last week he saw calls from the [address] area code but did not think to associate the call with [place].  Pt very much wants to schedule the appointment.  He wants to know if RN or Dr Krista Blue can reach out to [facility] and inform pt would like to be contacted again please.  Pt has not requested a call back but is open to one re: this request

## 2017-06-09 NOTE — Telephone Encounter (Signed)
Patient is scheduled at [facility] with Dr. Luanna Salk Oct 16 th arrive at 1:30 pm for 2:00 apt . Telephone [phone] .

## 2017-06-22 ENCOUNTER — Telehealth: Payer: Self-pay | Admitting: Neurology

## 2017-06-22 NOTE — Telephone Encounter (Signed)
error 

## 2017-06-22 NOTE — Telephone Encounter (Signed)
Spoke to patient - he wanted to know the information from his last injection.  He was last given Xeomin 300 units on 05/20/17.  Dr. Krista Blue placed the order for his referral to [facility] (at patient's request) and he is aware that his notes will be forwarded to them.

## 2017-06-22 NOTE — Telephone Encounter (Signed)
Pt called wanted to know what last dosing of botox was. He is wanting to know if the dosing was consistent. He is being referred to [facility] for his condition and wanted this information. Please call to advise

## 2017-08-13 ENCOUNTER — Telehealth: Payer: Self-pay | Admitting: Neurology

## 2017-08-13 NOTE — Telephone Encounter (Signed)
Patient requesting last office note be mailed to him to submit to social security.  He would like it to be mailed to his home address.  Placed in mail today.

## 2017-08-13 NOTE — Telephone Encounter (Signed)
Pt states he got a letter from [employer] re: an assessment of his disabilities.  He is asking that RN [name] calls him back with the name of his 2 conditions or have it sent out to him so that he can complete the form.  Please call

## 2017-08-26 ENCOUNTER — Telehealth: Payer: Self-pay | Admitting: Neurology

## 2017-08-26 ENCOUNTER — Encounter: Payer: Self-pay | Admitting: Neurology

## 2017-08-26 ENCOUNTER — Ambulatory Visit (INDEPENDENT_AMBULATORY_CARE_PROVIDER_SITE_OTHER): Payer: Medicare Other | Admitting: Neurology

## 2017-08-26 VITALS — BP 134/80 | HR 101 | Ht 66.0 in | Wt 145.5 lb

## 2017-08-26 DIAGNOSIS — G243 Spasmodic torticollis: Secondary | ICD-10-CM | POA: Diagnosis not present

## 2017-08-26 MED ORDER — INCOBOTULINUMTOXINA 100 UNITS IM SOLR
300.0000 [IU] | INTRAMUSCULAR | Status: DC
  Administered 2017-08-26: 300 [IU] via INTRAMUSCULAR

## 2017-08-26 NOTE — Progress Notes (Signed)
[facility]  PATIENT: Patrick Daniel DOB: 09-27-1956   REASON FOR VISIT: Follow-up for paraplegia, cervical dystonia gait disturbance, dysarthria HISTORY FROM: Patient    HISTORY OF PRESENT ILLNESS:HISTORY: Patrick Daniel return to evaluation for his continued gait difficulty  Patrick Daniel is a 61 year old right-handed Caucasian male. He has past medical history of hyperlipidemia, depression, He presented with gradual onset language difficulty, gait difficulty since 2009. He was evaluated by [name] neurologist at [name], reported normal MRI of brain, he was diagnosed with cervical dystonia, was given Botox injection of the cervical muscles, which has improved his symptoms temporarily.  He moved back to the [name] in April 2012, began to seek medical attention, initially evaluated by [name], reported again normal MRI of brain, also had trial of Haldol and baclofen without much improvement.  Sinemet did not help him. Artane and baclofen provide no help either. 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 He was evaluated by local neurosurgeon [name] at [facility], Because concomitant central nervous system degenerative disorder, cervical dystonia, he is hesitate to perform decompression surgery. I later referred him to [name] neurology, evaluated by [name], who has suggested Botox injection to alleviate his cervical dystonia symptoms first, may consider cervical decompression surgery later. Also, likely there is a component of central nervous system degenerative disorder, such as spinocerebellar ataxia,.  He has significant dysarthria, also dysphagia, gait difficulty, frequent falling, he denies sensory changes. Normal or  negative CMP, PEP, CBC, HIV, Lyme disease, SSA/B, TSH, ACE, RPR, CRP, CPK. He received first EMG guided Botox injection since March 2012, he reported great improvement, he has been receiving BOTOX injection every 3 months, there is significant improvement of his abnormal neck postural, but still with severe retrocollis, left turn, right tilt. he began to have moderate improvement after repeat injection. He was evaluated by his neurosurgeon [name] at [name], repeat MRI cervical showed advanced multilevel degenerative changes of the cervical spine, most pronounced at C5-C6, where there is severe central stenosis, impression on the cord with focal myelomalacia and severe bilateral neuroforaminal narrowing. Overall appearance is similar to prior study from 09/18/2010. He had posterior decompression and fusion surgery in December 2013, recovering well, but complains of neck muscle spasm, and chest muscle spasm continued spastic gait difficulty, occasional urinary incontinence, his posture is much improved, but has limited range of motion of his neck  UPDATE June 27th 2016:YYHe had EMG guided BOTOX injection by me since March 2012, most recent EMG guided Botox injection was by [name] April 10 2015,300 units of Botox A Today he came in for neurological evaluation for continued unsteady gait, difficulty finding a comfortable position, urinary urgency, he denies constipation, denies upper extremity paresthesia or weakness, slow worsening dysarthria, Overall, is continued slow progression compared to his baseline, UPDATE August 1st 2016: He is with his sister at today's clinical visit, he continue complains significant gait difficulty, slow worsening dysarthria, abnormal neck posturing, tendency to turn towards the left side,  We have reviewed MRI of the brain, he has out generalized atrophy, prominent superior vermis atrophy, out of proportion to other part of the brain, MRI of cervical spine,  posterior surgical change at C4, C6, and C2-T2 posterior fusion, metal artifact, multilevel degenerative changes, most significant at C5-6, causing mild canal stenosis,  no evidence of cord compression  Update August 07 2016: He was brought in by her sister but alone at today's clinical visit, he has been receiving EMG guided Botox a injection by [name], last injection was on July 25 2016, received total 300 units  Patient reported that overall his cervical dystonia symptoms is stable, but he is here today concerned worsening bilateral feet lower extremity paresthesia, this started more than 5 years ago, initially involving his feet now progressively getting worse, left more than right, to bilateral leg now,  He continue to complains of unsteady gait, tendency to fall, urinary and bowel urgency  I have personally reviewed MRI seen 2016, MRI of the brain showed generalized atrophy, most prominent in vermis,  genetic or degenerative processes that may present with both ataxia and dystonia such as the spinal cerebellar ataxias and OPCA.   Additional note is made of a few small T2/flair hyperintense foci consistent with minimal small vessel ischemic changes. MRI of cervical spine: Prior C4-C6 ACDF and C2-T2 posterior fusion. The vertebral bodies are normally aligned.   Metal artifact from these fusions obscures many of the images. Multilevel cervical degenerative disc disease, most significant findings are at C5-C6. There appears to be a posterior bony ridge that mildly narrows the neural canal but does not appear to be causing spinal cord compression. However, there is possible foraminal narrowing to the right that could lead to some right C6 nerve root impingement.     UPDATE Jan 10th 2018: He is here for repeat EMG guided xeomin injection for his cervical dystonia, we used 200 units today, he continue complains of bilateral lower extremity paresthesia, increased gait abnormality,  We have  personally reviewed MRI lumbar in December 2017 no significant abnormality. MRI of the brain in 2016, significant superior cerebella vermis atrophy, he reported long history of heavy alcohol abuse for more than 20 years,  MRI of the cervical spine in 2016, posterior cervical 4-6 decompression and fusion, no significant canal stenosis.  He is taking clonazepam 0.5 milligrams 1-2 tablets as needed for sleeping, tramadol as needed for pain, he continue complains of neck pain, limited range of motion  Update February 11 2017: He tolerated the injection well in January, no significant side effect notice, Cymbalta has helped his neck pain  He complains of increased gait abnormality will refer him to physical therapy today, hope PT can also help his neck pain  UPDATE Aug 26 2017: He complains of left-sided neck tightness, stiffness, difficulty falling to sleep at nighttime occasionally, and posterior neck pain, he has slow worsening gait abnormality   REVIEW OF SYSTEMS: Full 14 system review of systems performed and notable only for those listed, all others are neg:  Food allergy, incontinence of bladder frequent urination, urgency walking difficulty numbness speech difficulty tremor    ALLERGIES: Allergies  Allergen Reactions  . Baclofen     Weakness, drowsiness    HOME MEDICATIONS: Outpatient Medications Prior to Visit  Medication Sig Dispense Refill  . aspirin 325 MG tablet Take by mouth.    . clonazePAM (KLONOPIN) 0.5 MG tablet TAKE 1 TO 2 TABLETS BY MOUTH EVERY NIGHT AT BEDTIME FOR INSOMNIA 50 tablet 5  . DULoxetine (CYMBALTA) 60 MG capsule Take 1 capsule (60 mg total) by mouth daily. 30 capsule 11  . IncobotulinumtoxinA (XEOMIN IM) Inject into the muscle every 3 (three) months.     . loratadine (CLARITIN) 10 MG tablet Take 10 mg by mouth daily as needed for allergies.    Marland Kitchen  mometasone (ELOCON) 0.1 % cream Apply 1 application topically daily as needed.     . Multiple Vitamins tablet  Take 1 tablet by mouth.    . traMADol (ULTRAM) 50 MG tablet Take 1 tablet (50 mg total) by mouth 3 (three) times daily. TID prn 90 tablet 5  . incobotulinumtoxinA (XEOMIN) 100 units injection 300 Units      No facility-administered medications prior to visit.     PAST MEDICAL HISTORY: Past Medical History:  Diagnosis Date  . Dysarthria   . Dystonia   . Gait disturbance   . Myelopathy ()   . Paraplegia ([place])     PAST SURGICAL HISTORY: Past Surgical History:  Procedure Laterality Date  . NECK SURGERY     dec 2014    FAMILY HISTORY: Family History  Problem Relation Age of Onset  . Heart disease Mother   . Cancer Mother   . Heart Problems Father   . High Cholesterol Sister   . High blood pressure Sister   . Diabetes Sister   . High blood pressure Sister     SOCIAL HISTORY: Social History   Social History  . Marital status: Single    Spouse name: N/A  . Number of children: 1  . Years of education: N/A   Occupational History  . retired     Therapist, art   Social History Main Topics  . Smoking status: Former Smoker    Types: Cigarettes    Quit date: 02/01/2013  . Smokeless tobacco: Never Used     Comment: quit   . Alcohol use No  . Drug use: No  . Sexual activity: Not on file   Other Topics Concern  . Not on file   Social History Narrative   Patient lives in independent living [place]. Patient is retired from [employer]. Patient has one child. Right handed. Patient is divorced.   Caffeine- 12 - Coffee daily - one daily -soda     PHYSICAL EXAM  Vitals:   08/26/17 1512  BP: 134/80  Pulse: (!) 101  Weight: 145 lb 8 oz (66 kg)  Height: 5\' 6"  (1.676 m)   Body mass index is 23.48 kg/m. Generalized: Well developed, in no acute distress  Head: normocephalic and atraumatic,. Oropharynx benign  Neck: Severe limited range of motion to the neck, difficulty turning to the right side mouth right shoulder elevationHe has mild left shoulder anterior shift, mild  left shoulder elevation, moderate retrocollis, mild left tilt, right turn  Cardiac: Regular rate rhythm, no murmur  Musculoskeletal: No deformity   Neurological examination   Mentation: Alert oriented to time, place, history taking. Attention span and concentration appropriate. Recent and remote memory intact. Follows all commands, he has mild spastic dysarthria.  Cranial nerve II-XII: Pupils were equal round reactive to light extraocular movements were full, visual field were full on confrontational test. Facial sensation and strength were normal. hearing was intact to finger rubbing bilaterally. Uvula tongue midline. head turning and shoulder shrug were normal and symmetric.Tongue protrusion into cheek strength was normal. Motor: normal bulk and tone, full strength in the BUE, he has moderate bilateral lower extremity spasticity No focal weakness Sensory: normal and symmetric to light touch, pinprick, and Vibration,  Coordination: finger-nose-finger, heel-to-shin bilaterally, no dysmetria Reflexes: 3+ upper lower and symmetric, plantar responses were extensor bilaterally. Gait and Station: Rising up from seated position with pushoff , Stiff, cautious, unsteady gait  DIAGNOSTIC DATA (LABS, IMAGING, TESTING) -  ASSESSMENT AND PLAN 61 y.o.  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 Evidence of superior cerebellar vermis atrophy consistent with his history of lumbar alcohol abuse  Refer to [facility] Neurology per his request, will have appointment in early November 2018  Cervical dystonia: He has forceful neck turning towards the left side, with prominent left platysmas contraction, even left lower facial muscle distortion, mild tilt worse the left side, mild left shoulder elevation, retrocollis, moderate left turn,  Under EMG guidance used  300 units  of xeomin  Left inferior oblique capitis 25 units Right inferior oblique capitis 25 units Left splenius capitis 25 units Left splenius cervix 25 units  Left longissimus capitis 25 units Right sternocleidomastoid 25 units Right iliocostalis 25 units Left levator scapular 25 units  Right inferior oblique capitis 25 units Left scalenous posterior 25 units Left illiocostallis 25 units Right semispinalis 25 units  Marcial Pacas, M.D. Ph.D.  [facility] [place], [place] [address] Phone: [phone] Fax:      [phone]

## 2017-08-26 NOTE — Telephone Encounter (Signed)
Please increase assuming from 300 to 350 units at next injection in 3 months

## 2017-08-26 NOTE — Progress Notes (Signed)
**  Xeomin 100 units x 3 vials, NDC 1638-4536-46, Lot [id number], Exp 09/2019, office supply.//mck,[email or web address]**

## 2017-08-26 NOTE — Telephone Encounter (Signed)
Already sched xeomin inj-per michelle

## 2017-08-27 NOTE — Telephone Encounter (Signed)
Noted, thank you

## 2017-09-01 DIAGNOSIS — G118 Other hereditary ataxias: Secondary | ICD-10-CM | POA: Diagnosis not present

## 2017-09-03 ENCOUNTER — Telehealth: Payer: Self-pay | Admitting: Neurology

## 2017-09-03 NOTE — Telephone Encounter (Signed)
Patient called and requested to speak with Dr. Rhea Belton nurse regarding a referral that was made to [facility]. Please call and advise.

## 2017-09-03 NOTE — Telephone Encounter (Signed)
Called and spoke to [place]. Carlito just wanted to make sure Dr. Krista Blue received records from [facility].  I called [facility] at [phone] - I had to send a request to get records sent. I have sent request to .  Fax [phone] - fax [phone].

## 2017-09-03 NOTE — Telephone Encounter (Signed)
I see that in Care Everywhere that pt saw in 08-26-17 cannot pull notes yet.

## 2017-09-24 ENCOUNTER — Other Ambulatory Visit: Payer: Self-pay | Admitting: Neurology

## 2017-11-25 ENCOUNTER — Ambulatory Visit (INDEPENDENT_AMBULATORY_CARE_PROVIDER_SITE_OTHER): Payer: Medicare Other | Admitting: Neurology

## 2017-11-25 ENCOUNTER — Encounter: Payer: Self-pay | Admitting: Neurology

## 2017-11-25 VITALS — BP 134/79 | HR 88 | Ht 66.0 in | Wt 146.5 lb

## 2017-11-25 DIAGNOSIS — G8220 Paraplegia, unspecified: Secondary | ICD-10-CM

## 2017-11-25 DIAGNOSIS — G243 Spasmodic torticollis: Secondary | ICD-10-CM

## 2017-11-25 MED ORDER — INCOBOTULINUMTOXINA 100 UNITS IM SOLR
300.0000 [IU] | INTRAMUSCULAR | Status: DC
  Administered 2017-11-25: 300 [IU] via INTRAMUSCULAR

## 2017-11-25 MED ORDER — INCOBOTULINUMTOXINA 50 UNITS IM SOLR
50.0000 [IU] | INTRAMUSCULAR | Status: DC
  Administered 2017-11-25: 50 [IU] via INTRAMUSCULAR

## 2017-11-25 NOTE — Progress Notes (Signed)
[facility]  PATIENT: Patrick Daniel DOB: 09-27-1956   REASON FOR VISIT: Follow-up for paraplegia, cervical dystonia gait disturbance, dysarthria HISTORY FROM: Patient    HISTORY OF PRESENT ILLNESS:HISTORY: Patrick Daniel return to evaluation for his continued gait difficulty  Patrick Daniel is a 62 year old right-handed Caucasian male. He has past medical history of hyperlipidemia, depression, He presented with gradual onset language difficulty, gait difficulty since 2009. He was evaluated by [name] neurologist at [name], reported normal MRI of brain, he was diagnosed with cervical dystonia, was given Botox injection of the cervical muscles, which has improved his symptoms temporarily.  He moved back to the [name] in April 2012, began to seek medical attention, initially evaluated by [name], reported again normal MRI of brain, also had trial of Haldol and baclofen without much improvement.  Sinemet did not help him. Artane and baclofen provide no help either. 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 He was evaluated by local neurosurgeon [name] at [facility], Because concomitant central nervous system degenerative disorder, cervical dystonia, he is hesitate to perform decompression surgery. I later referred him to [name] neurology, evaluated by [name], who has suggested Botox injection to alleviate his cervical dystonia symptoms first, may consider cervical decompression surgery later. Also, likely there is a component of central nervous system degenerative disorder, such as spinocerebellar ataxia,.  He has significant dysarthria, also dysphagia, gait difficulty, frequent falling, he denies sensory changes. Normal or  negative CMP, PEP, CBC, HIV, Lyme disease, SSA/B, TSH, ACE, RPR, CRP, CPK. He received first EMG guided Botox injection since March 2012, he reported great improvement, he has been receiving BOTOX injection every 3 months, there is significant improvement of his abnormal neck postural, but still with severe retrocollis, left turn, right tilt. he began to have moderate improvement after repeat injection. He was evaluated by his neurosurgeon [name] at [name], repeat MRI cervical showed advanced multilevel degenerative changes of the cervical spine, most pronounced at C5-C6, where there is severe central stenosis, impression on the cord with focal myelomalacia and severe bilateral neuroforaminal narrowing. Overall appearance is similar to prior study from 09/18/2010. He had posterior decompression and fusion surgery in December 2013, recovering well, but complains of neck muscle spasm, and chest muscle spasm continued spastic gait difficulty, occasional urinary incontinence, his posture is much improved, but has limited range of motion of his neck  UPDATE June 27th 2016:YYHe had EMG guided BOTOX injection by me since March 2012, most recent EMG guided Botox injection was by [name] April 10 2015,300 units of Botox A Today he came in for neurological evaluation for continued unsteady gait, difficulty finding a comfortable position, urinary urgency, he denies constipation, denies upper extremity paresthesia or weakness, slow worsening dysarthria, Overall, is continued slow progression compared to his baseline, UPDATE August 1st 2016: He is with his sister at today's clinical visit, he continue complains significant gait difficulty, slow worsening dysarthria, abnormal neck posturing, tendency to turn towards the left side,  We have reviewed MRI of the brain, he has out generalized atrophy, prominent superior vermis atrophy, out of proportion to other part of the brain, MRI of cervical spine,  posterior surgical change at C4, C6, and C2-T2 posterior fusion, metal artifact, multilevel degenerative changes, most significant at C5-6, causing mild canal stenosis,  no evidence of cord compression  Update August 07 2016: He was brought in by her sister but alone at today's clinical visit, he has been receiving EMG guided Botox a injection by [name], last injection was on July 25 2016, received total 300 units  Patient reported that overall his cervical dystonia symptoms is stable, but he is here today concerned worsening bilateral feet lower extremity paresthesia, this started more than 5 years ago, initially involving his feet now progressively getting worse, left more than right, to bilateral leg now,  He continue to complains of unsteady gait, tendency to fall, urinary and bowel urgency  I have personally reviewed MRI seen 2016, MRI of the brain showed generalized atrophy, most prominent in vermis,  genetic or degenerative processes that may present with both ataxia and dystonia such as the spinal cerebellar ataxias and OPCA.   Additional note is made of a few small T2/flair hyperintense foci consistent with minimal small vessel ischemic changes. MRI of cervical spine: Prior C4-C6 ACDF and C2-T2 posterior fusion. The vertebral bodies are normally aligned.   Metal artifact from these fusions obscures many of the images. Multilevel cervical degenerative disc disease, most significant findings are at C5-C6. There appears to be a posterior bony ridge that mildly narrows the neural canal but does not appear to be causing spinal cord compression. However, there is possible foraminal narrowing to the right that could lead to some right C6 nerve root impingement.     UPDATE Jan 10th 2018: He is here for repeat EMG guided xeomin injection for his cervical dystonia, we used 200 units today, he continue complains of bilateral lower extremity paresthesia, increased gait abnormality,  We have  personally reviewed MRI lumbar in December 2017 no significant abnormality. MRI of the brain in 2016, significant superior cerebella vermis atrophy, he reported long history of heavy alcohol abuse for more than 20 years,  MRI of the cervical spine in 2016, posterior cervical 4-6 decompression and fusion, no significant canal stenosis.  He is taking clonazepam 0.5 milligrams 1-2 tablets as needed for sleeping, tramadol as needed for pain, he continue complains of neck pain, limited range of motion  Update February 11 2017: He tolerated the injection well in January, no significant side effect notice, Cymbalta has helped his neck pain  He complains of increased gait abnormality will refer him to physical therapy today, hope PT can also help his neck pain  UPDATE Aug 26 2017: He complains of left-sided neck tightness, stiffness, difficulty falling to sleep at nighttime occasionally, and posterior neck pain, he has slow worsening gait abnormality  Update November 25, 2017: He responded very well to previous xeomin injection, continue to have significant abnormal neck posturing, stiffness especially on the left side of his neck, progressive worsening gait abnormality,  I was able to review his most recent visit at [facility] by [place] November 2018, who was diagnosed with spinocerebellar ataxia, suggested botulism toxin to lower extremity to help walking,  REVIEW OF SYSTEMS: Full 14 system review of systems performed and notable only for those listed, all others are neg:  Food allergy, incontinence of bladder frequent urination, urgency walking difficulty numbness speech difficulty tremor    ALLERGIES: Allergies  Allergen Reactions  . Baclofen     Weakness, drowsiness    HOME MEDICATIONS: Outpatient Medications Prior to Visit  Medication Sig Dispense Refill  . aspirin 325 MG tablet Take by mouth.    . clonazePAM (KLONOPIN) 0.5 MG tablet TAKE 1 TO 2 TABLETS  BY MOUTH EVERY NIGHT AT BEDTIME FOR  INSOMNIA 50 tablet 5  . DULoxetine (CYMBALTA) 60 MG capsule Take 1 capsule (60 mg total) by mouth daily. 30 capsule 11  . IncobotulinumtoxinA (XEOMIN IM) Inject into the muscle every 3 (three) months.     . loratadine-pseudoephedrine (CLARITIN-D 24-HOUR) 10-240 MG 24 hr tablet Take 1 tablet by mouth daily.    . mometasone (ELOCON) 0.1 % cream Apply 1 application topically daily as needed.     . Multiple Vitamins tablet Take 1 tablet by mouth.    . traMADol (ULTRAM) 50 MG tablet TAKE 1 TABLET BY MOUTH THREE TIMES DAILY AS NEEDED 90 tablet 0  . loratadine (CLARITIN) 10 MG tablet Take 10 mg by mouth daily as needed for allergies.    Marland Kitchen incobotulinumtoxinA (XEOMIN) 100 units injection 300 Units      No facility-administered medications prior to visit.     PAST MEDICAL HISTORY: Past Medical History:  Diagnosis Date  . Dysarthria   . Dystonia   . Gait disturbance   . Myelopathy ([place])   . Paraplegia ([place])     PAST SURGICAL HISTORY: Past Surgical History:  Procedure Laterality Date  . NECK SURGERY     dec 2014    FAMILY HISTORY: Family History  Problem Relation Age of Onset  . Heart disease Mother   . Cancer Mother   . Heart Problems Father   . High Cholesterol Sister   . High blood pressure Sister   . Diabetes Sister   . High blood pressure Sister     SOCIAL HISTORY: Social History   Socioeconomic History  . Marital status: Single    Spouse name: Not on file  . Number of children: 1  . Years of education: Not on file  . Highest education level: Not on file  Social Needs  . Financial resource strain: Not on file  . Food insecurity - worry: Not on file  . Food insecurity - inability: Not on file  . Transportation needs - medical: Not on file  . Transportation needs - non-medical: Not on file  Occupational History  . Occupation: retired    Comment: Chief Technology Officer  . Smoking status: Former Smoker    Types: Cigarettes    Last attempt to quit: 02/01/2013    Years  since quitting: 4.8  . Smokeless tobacco: Never Used  . Tobacco comment: quit   Substance and Sexual Activity  . Alcohol use: No  . Drug use: No  . Sexual activity: Not on file  Other Topics Concern  . Not on file  Social History Narrative   Patient lives in independent living [place]. Patient is retired from [employer]. Patient has one child. Right handed. Patient is divorced.   Caffeine- 12 - Coffee daily - one daily -soda     PHYSICAL EXAM  Vitals:   11/25/17 1334  BP: 134/79  Pulse: 88  Weight: 146 lb 8 oz (66.5 kg)  Height: 5\' 6"  (1.676 m)   Body mass index is 23.65 kg/m. Generalized: Well developed, in no acute distress  Head: normocephalic and atraumatic,. Oropharynx benign  Neck: Severe limited range of motion to the neck, difficulty turning to the right side mouth right shoulder elevationHe has mild left shoulder anterior shift, mild left shoulder elevation, moderate retrocollis, mild left tilt, right turn  Cardiac: Regular rate rhythm, no murmur  Musculoskeletal: No deformity   Neurological examination   Mentation: Alert oriented to time, place, history taking.  Attention span and concentration appropriate. Recent and remote memory intact. Follows all commands, he has mild spastic dysarthria.  Cranial nerve II-XII: Pupils were equal round reactive to light extraocular movements were full, visual field were full on confrontational test. Facial sensation and strength were normal. hearing was intact to finger rubbing bilaterally. Uvula tongue midline. head turning and shoulder shrug were normal and symmetric.Tongue protrusion into cheek strength was normal. Motor: normal bulk and tone, full strength in the BUE, he has moderate bilateral lower extremity spasticity No focal weakness Sensory: normal and symmetric to light touch, pinprick, and Vibration,  Coordination: finger-nose-finger, heel-to-shin bilaterally, no dysmetria Reflexes: 3+ upper lower and symmetric,  plantar responses were extensor bilaterally. Gait and Station: Rising up from seated position with pushoff , Stiff, cautious, unsteady gait  DIAGNOSTIC DATA (LABS, IMAGING, TESTING) -  ASSESSMENT AND PLAN 62 y.o. 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 Evidence of superior cerebellar vermis atrophy, likely a component of central nervous system degenerative disorder  Cervical dystonia: He has forceful neck turning towards the left side, with prominent left platysmas contraction, even left lower facial muscle distortion, mild left shoulder elevation, moderate retrocollis, moderate left turn,  Under EMG guidance used  350 units of xeomin  Left inferior oblique capitis 25 units Right inferior oblique capitis 25 units Left splenius capitis 25 units Left splenius cervix 25 units  Left longissimus capitis 25 units Right sternocleidomastoid 25 units Right iliocostalis 25 units Left levator scapular 25 units  Right inferior oblique capitis 25 units Left scalenous posterior 25 units Left illiocostallis 25 units Right semispinalis 25 units    Left inferior oblique capitus 25 units Left scalenous medius 25   Spastic paraplegia  Was noted to have gradual worsening spastic paraplegia,  We will try EMG guided xeomin injection to bilateral lower extremity  Ask for xeomin total of 500 units  Marcial Pacas, M.D. Ph.D.  [facility] [place], [place] [address] Phone: [phone] Fax:      [phone]

## 2017-11-25 NOTE — Progress Notes (Signed)
**  Xeomin 100 units x 3 vials, NDC 4196-2229-79, Lot [id number], Exp 11/2019, office supply.//mck,rn**  **Xeomin 50 units x 1 vial, NDC 0259-1605-01, Lot [id number], Exp 05/2019, office supply.//mck,rn**

## 2017-11-26 ENCOUNTER — Other Ambulatory Visit: Payer: Self-pay | Admitting: Neurology

## 2017-12-14 DIAGNOSIS — H2513 Age-related nuclear cataract, bilateral: Secondary | ICD-10-CM | POA: Diagnosis not present

## 2017-12-14 DIAGNOSIS — H25043 Posterior subcapsular polar age-related cataract, bilateral: Secondary | ICD-10-CM | POA: Diagnosis not present

## 2018-01-28 DIAGNOSIS — H6980 Other specified disorders of Eustachian tube, unspecified ear: Secondary | ICD-10-CM | POA: Diagnosis not present

## 2018-01-28 DIAGNOSIS — E785 Hyperlipidemia, unspecified: Secondary | ICD-10-CM | POA: Diagnosis not present

## 2018-01-28 DIAGNOSIS — Z125 Encounter for screening for malignant neoplasm of prostate: Secondary | ICD-10-CM | POA: Diagnosis not present

## 2018-01-28 DIAGNOSIS — Z131 Encounter for screening for diabetes mellitus: Secondary | ICD-10-CM | POA: Diagnosis not present

## 2018-01-28 DIAGNOSIS — Z139 Encounter for screening, unspecified: Secondary | ICD-10-CM | POA: Diagnosis not present

## 2018-01-28 DIAGNOSIS — R7301 Impaired fasting glucose: Secondary | ICD-10-CM | POA: Diagnosis not present

## 2018-01-28 DIAGNOSIS — Z13220 Encounter for screening for lipoid disorders: Secondary | ICD-10-CM | POA: Diagnosis not present

## 2018-01-28 DIAGNOSIS — Z1331 Encounter for screening for depression: Secondary | ICD-10-CM | POA: Diagnosis not present

## 2018-01-28 DIAGNOSIS — H6121 Impacted cerumen, right ear: Secondary | ICD-10-CM | POA: Diagnosis not present

## 2018-01-28 DIAGNOSIS — Z1211 Encounter for screening for malignant neoplasm of colon: Secondary | ICD-10-CM | POA: Diagnosis not present

## 2018-01-28 DIAGNOSIS — Z0000 Encounter for general adult medical examination without abnormal findings: Secondary | ICD-10-CM | POA: Diagnosis not present

## 2018-01-28 DIAGNOSIS — Z1212 Encounter for screening for malignant neoplasm of rectum: Secondary | ICD-10-CM | POA: Diagnosis not present

## 2018-02-24 ENCOUNTER — Encounter: Payer: Self-pay | Admitting: Neurology

## 2018-02-24 ENCOUNTER — Ambulatory Visit (INDEPENDENT_AMBULATORY_CARE_PROVIDER_SITE_OTHER): Payer: Medicare Other | Admitting: Neurology

## 2018-02-24 VITALS — BP 136/71 | HR 100 | Ht 66.0 in | Wt 150.0 lb

## 2018-02-24 DIAGNOSIS — G243 Spasmodic torticollis: Secondary | ICD-10-CM | POA: Diagnosis not present

## 2018-02-24 DIAGNOSIS — G8220 Paraplegia, unspecified: Secondary | ICD-10-CM

## 2018-02-24 DIAGNOSIS — R269 Unspecified abnormalities of gait and mobility: Secondary | ICD-10-CM

## 2018-02-24 MED ORDER — CARBIDOPA-LEVODOPA 25-100 MG PO TABS: 1.0000 | ORAL_TABLET | Freq: Three times a day (TID) | ORAL | 11 refills | Status: DC

## 2018-02-24 NOTE — Progress Notes (Signed)
[facility]  PATIENT: Patrick Daniel DOB: 09-27-1956   REASON FOR VISIT: Follow-up for paraplegia, cervical dystonia gait disturbance, dysarthria HISTORY FROM: Patient    HISTORY OF PRESENT ILLNESS:HISTORY: Patrick Daniel return to evaluation for his continued gait difficulty  Patrick Daniel is a 62 year old right-handed Caucasian male. He has past medical history of hyperlipidemia, depression, He presented with gradual onset language difficulty, gait difficulty since 2009. He was evaluated by [name] neurologist at [name], reported normal MRI of brain, he was diagnosed with cervical dystonia, was given Botox injection of the cervical muscles, which has improved his symptoms temporarily.  He moved back to the [name] in April 2012, began to seek medical attention, initially evaluated by [name], reported again normal MRI of brain, also had trial of Haldol and baclofen without much improvement.  Sinemet did not help him. Artane and baclofen provide no help either. 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 He was evaluated by local neurosurgeon [name] at [facility], Because concomitant central nervous system degenerative disorder, cervical dystonia, he is hesitate to perform decompression surgery. I later referred him to [name] neurology, evaluated by [name], who has suggested Botox injection to alleviate his cervical dystonia symptoms first, may consider cervical decompression surgery later. Also, likely there is a component of central nervous system degenerative disorder, such as spinocerebellar ataxia,.  He has significant dysarthria, also dysphagia, gait difficulty, frequent falling, he denies sensory changes. Normal or  negative CMP, PEP, CBC, HIV, Lyme disease, SSA/B, TSH, ACE, RPR, CRP, CPK. He received first EMG guided Botox injection since March 2012, he reported great improvement, he has been receiving BOTOX injection every 3 months, there is significant improvement of his abnormal neck postural, but still with severe retrocollis, left turn, right tilt. he began to have moderate improvement after repeat injection. He was evaluated by his neurosurgeon [name] at [name], repeat MRI cervical showed advanced multilevel degenerative changes of the cervical spine, most pronounced at C5-C6, where there is severe central stenosis, impression on the cord with focal myelomalacia and severe bilateral neuroforaminal narrowing. Overall appearance is similar to prior study from 09/18/2010. He had posterior decompression and fusion surgery in December 2013, recovering well, but complains of neck muscle spasm, and chest muscle spasm continued spastic gait difficulty, occasional urinary incontinence, his posture is much improved, but has limited range of motion of his neck  UPDATE June 27th 2016:YYHe had EMG guided BOTOX injection by me since March 2012, most recent EMG guided Botox injection was by [name] April 10 2015,300 units of Botox A Today he came in for neurological evaluation for continued unsteady gait, difficulty finding a comfortable position, urinary urgency, he denies constipation, denies upper extremity paresthesia or weakness, slow worsening dysarthria, Overall, is continued slow progression compared to his baseline, UPDATE August 1st 2016: He is with his sister at today's clinical visit, he continue complains significant gait difficulty, slow worsening dysarthria, abnormal neck posturing, tendency to turn towards the left side,  We have reviewed MRI of the brain, he has out generalized atrophy, prominent superior vermis atrophy, out of proportion to other part of the brain, MRI of cervical spine,  posterior surgical change at C4, C6, and C2-T2 posterior fusion, metal artifact, multilevel degenerative changes, most significant at C5-6, causing mild canal stenosis,  no evidence of cord compression  Update August 07 2016: He was brought in by her sister but alone at today's clinical visit, he has been receiving EMG guided Botox a injection by [name], last injection was on July 25 2016, received total 300 units  Patient reported that overall his cervical dystonia symptoms is stable, but he is here today concerned worsening bilateral feet lower extremity paresthesia, this started more than 5 years ago, initially involving his feet now progressively getting worse, left more than right, to bilateral leg now,  He continue to complains of unsteady gait, tendency to fall, urinary and bowel urgency  I have personally reviewed MRI seen 2016, MRI of the brain showed generalized atrophy, most prominent in vermis,  genetic or degenerative processes that may present with both ataxia and dystonia such as the spinal cerebellar ataxias and OPCA.   Additional note is made of a few small T2/flair hyperintense foci consistent with minimal small vessel ischemic changes. MRI of cervical spine: Prior C4-C6 ACDF and C2-T2 posterior fusion. The vertebral bodies are normally aligned.   Metal artifact from these fusions obscures many of the images. Multilevel cervical degenerative disc disease, most significant findings are at C5-C6. There appears to be a posterior bony ridge that mildly narrows the neural canal but does not appear to be causing spinal cord compression. However, there is possible foraminal narrowing to the right that could lead to some right C6 nerve root impingement.     UPDATE Jan 10th 2018: He is here for repeat EMG guided xeomin injection for his cervical dystonia, we used 200 units today, he continue complains of bilateral lower extremity paresthesia, increased gait abnormality,  We have  personally reviewed MRI lumbar in December 2017 no significant abnormality. MRI of the brain in 2016, significant superior cerebella vermis atrophy, he reported long history of heavy alcohol abuse for more than 20 years,  MRI of the cervical spine in 2016, posterior cervical 4-6 decompression and fusion, no significant canal stenosis.  He is taking clonazepam 0.5 milligrams 1-2 tablets as needed for sleeping, tramadol as needed for pain, he continue complains of neck pain, limited range of motion  Update February 11 2017: He tolerated the injection well in January, no significant side effect notice, Cymbalta has helped his neck pain  He complains of increased gait abnormality will refer him to physical therapy today, hope PT can also help his neck pain  UPDATE Aug 26 2017: He complains of left-sided neck tightness, stiffness, difficulty falling to sleep at nighttime occasionally, and posterior neck pain, he has slow worsening gait abnormality  Update November 25, 2017: He responded very well to previous xeomin injection, continue to have significant abnormal neck posturing, stiffness especially on the left side of his neck, progressive worsening gait abnormality,  I was able to review his most recent visit at [facility] by [facility] November 2018, who was diagnosed with spinocerebellar ataxia, suggested botulism toxin to lower extremity to help walking,  UPDATE Feb 24 2018: This is his first EMG guided injection for lower extremity spasticity, 300 units to LEs and 200 units xeomin for cervical dystonia.  REVIEW OF SYSTEMS: Full 14 system review of systems performed and notable only for those listed, all others are neg:  Food allergy, incontinence of bladder frequent urination, urgency walking difficulty numbness speech difficulty tremor    ALLERGIES: Allergies  Allergen Reactions  . Baclofen     Weakness, drowsiness    HOME MEDICATIONS: Outpatient Medications Prior to Visit  Medication Sig  Dispense Refill  . aspirin 325 MG tablet Take by mouth.    . clonazePAM (KLONOPIN) 0.5 MG tablet TAKE 1 TO 2 TABLETS BY MOUTH EVERY NIGHT AT BEDTIME FOR INSOMNIA 50 tablet 5  . DULoxetine (CYMBALTA) 60 MG capsule TAKE 1 CAPSULE(60 MG) BY MOUTH DAILY 30 capsule 11  . IncobotulinumtoxinA (XEOMIN IM) Inject into the muscle every 3 (three) months.     . loratadine-pseudoephedrine (CLARITIN-D 24-HOUR) 10-240 MG 24 hr tablet Take 1 tablet by mouth daily.    . mometasone (ELOCON) 0.1 % cream Apply 1 application topically daily as needed.     . Multiple Vitamins tablet Take 1 tablet by mouth.    . traMADol (ULTRAM) 50 MG tablet TAKE 1 TABLET BY MOUTH THREE TIMES DAILY AS NEEDED 90 tablet 0  . incobotulinumtoxinA (XEOMIN) 100 units injection 300 Units     . incobotulinumtoxinA (XEOMIN) 50 units injection 50 Units      No facility-administered medications prior to visit.     PAST MEDICAL HISTORY: Past Medical History:  Diagnosis Date  . Dysarthria   . Dystonia   . Gait disturbance   . Myelopathy ([place])   . Paraplegia ([place])     PAST SURGICAL HISTORY: Past Surgical History:  Procedure Laterality Date  . NECK SURGERY     dec 2014    FAMILY HISTORY: Family History  Problem Relation Age of Onset  . Heart disease Mother   . Cancer Mother   . Heart Problems Father   . High Cholesterol Sister   . High blood pressure Sister   . Diabetes Sister   . High blood pressure Sister     SOCIAL HISTORY: Social History   Socioeconomic History  . Marital status: Single    Spouse name: Not on file  . Number of children: 1  . Years of education: Not on file  . Highest education level: Not on file  Occupational History  . Occupation: retired    Comment: Biomedical scientist  . Financial resource strain: Not on file  . Food insecurity:    Worry: Not on file    Inability: Not on file  . Transportation needs:    Medical: Not on file    Non-medical: Not on file  Tobacco Use  . Smoking status:  Former Smoker    Types: Cigarettes    Last attempt to quit: 02/01/2013    Years since quitting: 5.0  . Smokeless tobacco: Never Used  . Tobacco comment: quit   Substance and Sexual Activity  . Alcohol use: No  . Drug use: No  . Sexual activity: Not on file  Lifestyle  . Physical activity:    Days per week: Not on file    Minutes per session: Not on file  . Stress: Not on file  Relationships  . Social connections:    Talks on phone: Not on file    Gets together: Not on file    Attends religious service: Not on file    Active member of club or organization: Not on file    Attends meetings of clubs or organizations: Not on file    Relationship status: Not on file  . Intimate partner violence:    Fear of current or ex partner: Not on file    Emotionally abused: Not on file    Physically abused: Not on file    Forced sexual activity: Not on file  Other Topics Concern  . Not on file  Social History Narrative   Patient lives in independent living [place]. Patient is retired from [employer]. Patient has one child. Right handed. Patient is divorced.   Caffeine- 12 - Coffee daily - one daily -soda     PHYSICAL EXAM  Vitals:   02/24/18 1518  BP: 136/71  Pulse: 100  Weight: 150 lb (68 kg)  Height: 5\' 6"  (1.676 m)   Body mass index is 24.21 kg/m. Generalized: Well developed, in no acute distress  Head: normocephalic and atraumatic,. Oropharynx benign  Neck: Severe limited range of motion to the neck, difficulty turning to the right side mouth right shoulder elevationHe has mild left shoulder anterior shift, mild left shoulder elevation, moderate retrocollis, mild left tilt, right turn  Cardiac: Regular rate rhythm, no murmur  Musculoskeletal: No deformity   Neurological examination   Mentation: Alert oriented to time, place, history taking. Attention span and concentration appropriate. Recent and remote memory intact. Follows all commands, he has mild spastic dysarthria.   Cranial nerve II-XII: Pupils were equal round reactive to light extraocular movements were full, visual field were full on confrontational test. Facial sensation and strength were normal. hearing was intact to finger rubbing bilaterally. Uvula tongue midline. head turning and shoulder shrug were normal and symmetric.Tongue protrusion into cheek strength was normal. Motor: normal bulk and tone, full strength in the BUE, he has moderate bilateral lower extremity spasticity No focal weakness Sensory: normal and symmetric to light touch, pinprick, and Vibration,  Coordination: finger-nose-finger, heel-to-shin bilaterally, no dysmetria Reflexes: 3+ upper lower and symmetric, plantar responses were extensor bilaterally. Gait and Station: Rising up from seated position with pushoff , Stiff, cautious, unsteady gait  DIAGNOSTIC DATA (LABS, IMAGING, TESTING) -  ASSESSMENT AND PLAN 62 y.o. 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 Evidence of superior cerebellar vermis atrophy, likely a component of central nervous system degenerative disorder  Cervical dystonia: He has forceful neck turning towards the left side, with prominent left platysmas contraction, even left lower facial muscle distortion, mild left shoulder elevation, moderate retrocollis, moderate left turn,  Under EMG guidance used 200 units of xeomin  Left inferior oblique capitis 25 units Left splenius capitis 25 units Left splenius cervix 25 units Left longissimus capitis 25 units  Right longissimus capitis 25 units Right Splenius capitus 25 units Right semispinalis 25 units Right inferior oblique capitis 25 units  Spastic paraplegia   First EMG guided xeomin injection to bilateral lower extremity on Feb 24 2018, used xeomin 300 units  Right adductor longus 50 units x2=100 units Left  adductor longus 50 units x2=100 units  Right adductor magnus 50 units Left adductor magnus 50 units    Marcial Pacas, M.D. Ph.D.  [facility] [place], [place] [address] Phone: [phone] Fax:      [phone]

## 2018-02-24 NOTE — Progress Notes (Signed)
**  Xeomin 100 units x 5 vials, NDC 1657-9038-33, Lot [id number], Exp 03/2020, office supply,//mck,rn**

## 2018-02-27 DIAGNOSIS — G8220 Paraplegia, unspecified: Secondary | ICD-10-CM

## 2018-02-27 DIAGNOSIS — G243 Spasmodic torticollis: Secondary | ICD-10-CM

## 2018-02-27 MED ORDER — INCOBOTULINUMTOXINA 100 UNITS IM SOLR
500.0000 [IU] | INTRAMUSCULAR | Status: DC
  Administered 2018-02-27: 500 [IU] via INTRAMUSCULAR

## 2018-03-30 DIAGNOSIS — H25041 Posterior subcapsular polar age-related cataract, right eye: Secondary | ICD-10-CM | POA: Diagnosis not present

## 2018-04-06 ENCOUNTER — Telehealth: Payer: Self-pay | Admitting: Neurology

## 2018-04-06 NOTE — Telephone Encounter (Signed)
Will only use xeomin 300 units for his next injection, only cervical, b& b through office.

## 2018-04-06 NOTE — Telephone Encounter (Signed)
Pt requesting a call back, pt didn't not want to discuss with me

## 2018-04-06 NOTE — Telephone Encounter (Signed)
Spoke to patient - feels Xeomin was helpful for his Cervical Dystonia.  However, he does not feel Xeomin was benefical for his Spastic Paraplegia (lower extremities injected on 02/24/18).  He has a pending appt on 05/26/18 and would like to only have his neck injected.

## 2018-04-09 DIAGNOSIS — Z01818 Encounter for other preprocedural examination: Secondary | ICD-10-CM | POA: Diagnosis not present

## 2018-04-09 DIAGNOSIS — H25041 Posterior subcapsular polar age-related cataract, right eye: Secondary | ICD-10-CM | POA: Diagnosis not present

## 2018-05-04 DIAGNOSIS — Z79899 Other long term (current) drug therapy: Secondary | ICD-10-CM | POA: Diagnosis not present

## 2018-05-04 DIAGNOSIS — E785 Hyperlipidemia, unspecified: Secondary | ICD-10-CM | POA: Diagnosis not present

## 2018-05-04 DIAGNOSIS — H259 Unspecified age-related cataract: Secondary | ICD-10-CM | POA: Diagnosis not present

## 2018-05-04 DIAGNOSIS — H25041 Posterior subcapsular polar age-related cataract, right eye: Secondary | ICD-10-CM | POA: Diagnosis not present

## 2018-05-26 ENCOUNTER — Encounter: Payer: Self-pay | Admitting: Neurology

## 2018-05-26 ENCOUNTER — Ambulatory Visit (INDEPENDENT_AMBULATORY_CARE_PROVIDER_SITE_OTHER): Payer: Medicare Other | Admitting: Neurology

## 2018-05-26 ENCOUNTER — Telehealth: Payer: Self-pay | Admitting: Neurology

## 2018-05-26 VITALS — BP 124/71 | HR 92 | Ht 66.0 in | Wt 148.3 lb

## 2018-05-26 DIAGNOSIS — G243 Spasmodic torticollis: Secondary | ICD-10-CM

## 2018-05-26 MED ORDER — INCOBOTULINUMTOXINA 100 UNITS IM SOLR
300.0000 [IU] | INTRAMUSCULAR | Status: DC
  Administered 2018-05-26: 300 [IU] via INTRAMUSCULAR

## 2018-05-26 NOTE — Telephone Encounter (Signed)
Use xeomin 200 units at next injection in Nov 2019

## 2018-05-26 NOTE — Telephone Encounter (Signed)
noted 

## 2018-05-26 NOTE — Progress Notes (Signed)
[facility]  PATIENT: Patrick Daniel DOB: 09-27-1956   REASON FOR VISIT: Follow-up for paraplegia, cervical dystonia gait disturbance, dysarthria HISTORY FROM: Patient    HISTORY OF PRESENT ILLNESS:HISTORY: Patrick Daniel return to evaluation for his continued gait difficulty  Patrick Daniel is a 62 year old right-handed Caucasian male. He has past medical history of hyperlipidemia, depression, He presented with gradual onset language difficulty, gait difficulty since 2009. He was evaluated by [name] neurologist at [name], reported normal MRI of brain, he was diagnosed with cervical dystonia, was given Botox injection of the cervical muscles, which has improved his symptoms temporarily.  He moved back to the [name] in April 2012, began to seek medical attention, initially evaluated by [name], reported again normal MRI of brain, also had trial of Haldol and baclofen without much improvement.  Sinemet did not help him. Artane and baclofen provide no help either. 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 He was evaluated by local neurosurgeon [name] at [facility], Because concomitant central nervous system degenerative disorder, cervical dystonia, he is hesitate to perform decompression surgery. I later referred him to [name] neurology, evaluated by [name], who has suggested Botox injection to alleviate his cervical dystonia symptoms first, may consider cervical decompression surgery later. Also, likely there is a component of central nervous system degenerative disorder, such as spinocerebellar ataxia,.  He has significant dysarthria, also dysphagia, gait difficulty, frequent falling, he denies sensory changes. Normal or  negative CMP, PEP, CBC, HIV, Lyme disease, SSA/B, TSH, ACE, RPR, CRP, CPK. He received first EMG guided Botox injection since March 2012, he reported great improvement, he has been receiving BOTOX injection every 3 months, there is significant improvement of his abnormal neck postural, but still with severe retrocollis, left turn, right tilt. he began to have moderate improvement after repeat injection. He was evaluated by his neurosurgeon [name] at [name], repeat MRI cervical showed advanced multilevel degenerative changes of the cervical spine, most pronounced at C5-C6, where there is severe central stenosis, impression on the cord with focal myelomalacia and severe bilateral neuroforaminal narrowing. Overall appearance is similar to prior study from 09/18/2010. He had posterior decompression and fusion surgery in December 2013, recovering well, but complains of neck muscle spasm, and chest muscle spasm continued spastic gait difficulty, occasional urinary incontinence, his posture is much improved, but has limited range of motion of his neck  UPDATE June 27th 2016:YYHe had EMG guided BOTOX injection by me since March 2012, most recent EMG guided Botox injection was by [name] April 10 2015,300 units of Botox A Today he came in for neurological evaluation for continued unsteady gait, difficulty finding a comfortable position, urinary urgency, he denies constipation, denies upper extremity paresthesia or weakness, slow worsening dysarthria, Overall, is continued slow progression compared to his baseline, UPDATE August 1st 2016: He is with his sister at today's clinical visit, he continue complains significant gait difficulty, slow worsening dysarthria, abnormal neck posturing, tendency to turn towards the left side,  We have reviewed MRI of the brain, he has out generalized atrophy, prominent superior vermis atrophy, out of proportion to other part of the brain, MRI of cervical spine,  posterior surgical change at C4, C6, and C2-T2 posterior fusion, metal artifact, multilevel degenerative changes, most significant at C5-6, causing mild canal stenosis,  no evidence of cord compression  Update August 07 2016: He was brought in by her sister but alone at today's clinical visit, he has been receiving EMG guided Botox a injection by [name], last injection was on July 25 2016, received total 300 units  Patient reported that overall his cervical dystonia symptoms is stable, but he is here today concerned worsening bilateral feet lower extremity paresthesia, this started more than 5 years ago, initially involving his feet now progressively getting worse, left more than right, to bilateral leg now,  He continue to complains of unsteady gait, tendency to fall, urinary and bowel urgency  I have personally reviewed MRI seen 2016, MRI of the brain showed generalized atrophy, most prominent in vermis,  genetic or degenerative processes that may present with both ataxia and dystonia such as the spinal cerebellar ataxias and OPCA.   Additional note is made of a few small T2/flair hyperintense foci consistent with minimal small vessel ischemic changes. MRI of cervical spine: Prior C4-C6 ACDF and C2-T2 posterior fusion. The vertebral bodies are normally aligned.   Metal artifact from these fusions obscures many of the images. Multilevel cervical degenerative disc disease, most significant findings are at C5-C6. There appears to be a posterior bony ridge that mildly narrows the neural canal but does not appear to be causing spinal cord compression. However, there is possible foraminal narrowing to the right that could lead to some right C6 nerve root impingement.     UPDATE Jan 10th 2018: He is here for repeat EMG guided xeomin injection for his cervical dystonia, we used 200 units today, he continue complains of bilateral lower extremity paresthesia, increased gait abnormality,  We have  personally reviewed MRI lumbar in December 2017 no significant abnormality. MRI of the brain in 2016, significant superior cerebella vermis atrophy, he reported long history of heavy alcohol abuse for more than 20 years,  MRI of the cervical spine in 2016, posterior cervical 4-6 decompression and fusion, no significant canal stenosis.  He is taking clonazepam 0.5 milligrams 1-2 tablets as needed for sleeping, tramadol as needed for pain, he continue complains of neck pain, limited range of motion  Update February 11 2017: He tolerated the injection well in January, no significant side effect notice, Cymbalta has helped his neck pain  He complains of increased gait abnormality will refer him to physical therapy today, hope PT can also help his neck pain  UPDATE Aug 26 2017: He complains of left-sided neck tightness, stiffness, difficulty falling to sleep at nighttime occasionally, and posterior neck pain, he has slow worsening gait abnormality  Update November 25, 2017: He responded very well to previous xeomin injection, continue to have significant abnormal neck posturing, stiffness especially on the left side of his neck, progressive worsening gait abnormality,  I was able to review his most recent visit at [facility] by [place] November 2018, who was diagnosed with spinocerebellar ataxia, suggested botulism toxin to lower extremity to help walking,  UPDATE Feb 24 2018: This is his first EMG guided injection for lower extremity spasticity, 300 units to LEs and 200 units xeomin for cervical dystonia.  UPDATE May 26 2018: He denies significant improvement in lower extremity injection, will only do cervical dystonia injection today, we used only 300 units of xeomin today  REVIEW OF SYSTEMS: Full 14 system review of systems performed and notable only for those listed, all others are neg:  As above.    ALLERGIES: Allergies  Allergen Reactions  . Baclofen  Weakness, drowsiness    HOME  MEDICATIONS: Outpatient Medications Prior to Visit  Medication Sig Dispense Refill  . aspirin 325 MG tablet Take by mouth.    . clonazePAM (KLONOPIN) 0.5 MG tablet TAKE 1 TO 2 TABLETS BY MOUTH EVERY NIGHT AT BEDTIME FOR INSOMNIA 50 tablet 5  . DULoxetine (CYMBALTA) 60 MG capsule TAKE 1 CAPSULE(60 MG) BY MOUTH DAILY 30 capsule 11  . IncobotulinumtoxinA (XEOMIN IM) Inject 300 Units into the muscle every 3 (three) months.     . loratadine-pseudoephedrine (CLARITIN-D 24-HOUR) 10-240 MG 24 hr tablet Take 1 tablet by mouth daily.    . mometasone (ELOCON) 0.1 % cream Apply 1 application topically daily as needed.     . Multiple Vitamins tablet Take 1 tablet by mouth.    . traMADol (ULTRAM) 50 MG tablet TAKE 1 TABLET BY MOUTH THREE TIMES DAILY AS NEEDED 90 tablet 0  . carbidopa-levodopa (SINEMET IR) 25-100 MG tablet Take 1 tablet by mouth 3 (three) times daily. (Patient not taking: Reported on 05/26/2018) 90 tablet 11  . incobotulinumtoxinA (XEOMIN) 100 units injection 500 Units      No facility-administered medications prior to visit.     PAST MEDICAL HISTORY: Past Medical History:  Diagnosis Date  . Dysarthria   . Dystonia   . Gait disturbance   . Myelopathy ([place])   . Paraplegia ([place])     PAST SURGICAL HISTORY: Past Surgical History:  Procedure Laterality Date  . NECK SURGERY     dec 2014    FAMILY HISTORY: Family History  Problem Relation Age of Onset  . Heart disease Mother   . Cancer Mother   . Heart Problems Father   . High Cholesterol Sister   . High blood pressure Sister   . Diabetes Sister   . High blood pressure Sister     SOCIAL HISTORY: Social History   Socioeconomic History  . Marital status: Single    Spouse name: Not on file  . Number of children: 1  . Years of education: Not on file  . Highest education level: Not on file  Occupational History  . Occupation: retired    Comment: Biomedical scientist  . Financial resource strain: Not on file  . Food  insecurity:    Worry: Not on file    Inability: Not on file  . Transportation needs:    Medical: Not on file    Non-medical: Not on file  Tobacco Use  . Smoking status: Former Smoker    Types: Cigarettes    Last attempt to quit: 02/01/2013    Years since quitting: 5.3  . Smokeless tobacco: Never Used  . Tobacco comment: quit   Substance and Sexual Activity  . Alcohol use: No  . Drug use: No  . Sexual activity: Not on file  Lifestyle  . Physical activity:    Days per week: Not on file    Minutes per session: Not on file  . Stress: Not on file  Relationships  . Social connections:    Talks on phone: Not on file    Gets together: Not on file    Attends religious service: Not on file    Active member of club or organization: Not on file    Attends meetings of clubs or organizations: Not on file    Relationship status: Not on file  . Intimate partner violence:    Fear of current or ex partner: Not on file    Emotionally abused: Not  on file    Physically abused: Not on file    Forced sexual activity: Not on file  Other Topics Concern  . Not on file  Social History Narrative   Patient lives in independent living [place]. Patient is retired from [employer]. Patient has one child. Right handed. Patient is divorced.   Caffeine- 12 - Coffee daily - one daily -soda     PHYSICAL EXAM  Vitals:   05/26/18 1456  BP: 124/71  Pulse: 92  Weight: 148 lb 5 oz (67.3 kg)  Height: 5\' 6"  (1.676 m)   Body mass index is 23.94 kg/m. Generalized: Well developed, in no acute distress  Head: normocephalic and atraumatic,. Oropharynx benign  Neck: Severe limited range of motion to the neck, difficulty turning to the right side mouth right shoulder elevationHe has mild left shoulder anterior shift, mild left shoulder elevation, moderate retrocollis, mild left tilt, right turn  Cardiac: Regular rate rhythm, no murmur  Musculoskeletal: No deformity   Neurological examination    Mentation: Alert oriented to time, place, history taking. Attention span and concentration appropriate. Recent and remote memory intact. Follows all commands, he has mild spastic dysarthria.  Cranial nerve II-XII: Pupils were equal round reactive to light extraocular movements were full, visual field were full on confrontational test. Facial sensation and strength were normal. hearing was intact to finger rubbing bilaterally. Uvula tongue midline. head turning and shoulder shrug were normal and symmetric.Tongue protrusion into cheek strength was normal. Motor: normal bulk and tone, full strength in the BUE, he has moderate bilateral lower extremity spasticity No focal weakness Sensory: normal and symmetric to light touch, pinprick, and Vibration,  Coordination: finger-nose-finger, heel-to-shin bilaterally, no dysmetria Reflexes: 3+ upper lower and symmetric, plantar responses were extensor bilaterally. Gait and Station: Rising up from seated position with pushoff , Stiff, cautious, unsteady gait  DIAGNOSTIC DATA (LABS, IMAGING, TESTING) -  ASSESSMENT AND PLAN 62 y.o. 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 Evidence of superior cerebellar vermis atrophy, likely a component of central nervous system degenerative disorder  Cervical dystonia: He has forceful neck turning towards the left side, with occasional left platysmas contraction,  No significant shoulder elevation, mild retrocollis, mild left turn,  Under EMG guidance used 300 units of xeomin  Left inferior oblique capitis 25 units Left splenius capitis 25 units Left splenius cervix 25 unitsx2=50 Left longissimus capitis 25 units Left semispinalis 25 units  Right longissimus capitis 25 units Right Splenius capitus 25 unitsx2=50 Right semispinalis 25 units Right inferior oblique  capitis 25 units Right splenius cervix 25 units        Marcial Pacas, M.D. Ph.D.  [facility] [place], [place] [address] Phone: [phone] Fax:      [phone]

## 2018-05-26 NOTE — Progress Notes (Signed)
**  Xeomin 100 units x 3 vials, Delia 0259-1610-01, Lot [id number], Exp 07/2020, office supply.//mck,rn**

## 2018-05-27 NOTE — Telephone Encounter (Signed)
Noted, thank you

## 2018-06-02 ENCOUNTER — Other Ambulatory Visit: Payer: Self-pay | Admitting: Neurology

## 2018-06-03 ENCOUNTER — Encounter: Payer: Self-pay | Admitting: *Deleted

## 2018-06-03 ENCOUNTER — Telehealth: Payer: Self-pay | Admitting: Neurology

## 2018-06-03 NOTE — Telephone Encounter (Signed)
Pt requesting a call, stating he has been put on a new antibiotic and is wanting to make sure it will not interfere with his other medication. Please call to advise

## 2018-06-03 NOTE — Telephone Encounter (Signed)
Spoke to patient - he has been placed on doxycycline 20mg  daily for sixty days for a boil.  He is taking oralclonazepam, tramadol and duloxetine from our office. He also gets Xeomin injections here. Ok per vo by Dr. Krista Blue to be on doxycyline.  Pt aware.

## 2018-06-22 DIAGNOSIS — H25042 Posterior subcapsular polar age-related cataract, left eye: Secondary | ICD-10-CM | POA: Diagnosis not present

## 2018-06-22 DIAGNOSIS — Z79899 Other long term (current) drug therapy: Secondary | ICD-10-CM | POA: Diagnosis not present

## 2018-06-22 DIAGNOSIS — G249 Dystonia, unspecified: Secondary | ICD-10-CM | POA: Diagnosis not present

## 2018-06-22 DIAGNOSIS — E7800 Pure hypercholesterolemia, unspecified: Secondary | ICD-10-CM | POA: Diagnosis not present

## 2018-06-22 DIAGNOSIS — G4700 Insomnia, unspecified: Secondary | ICD-10-CM | POA: Diagnosis not present

## 2018-06-22 DIAGNOSIS — G20 Parkinson's disease: Secondary | ICD-10-CM | POA: Diagnosis not present

## 2018-06-22 DIAGNOSIS — F329 Major depressive disorder, single episode, unspecified: Secondary | ICD-10-CM | POA: Diagnosis not present

## 2018-06-22 DIAGNOSIS — M1990 Unspecified osteoarthritis, unspecified site: Secondary | ICD-10-CM | POA: Diagnosis not present

## 2018-08-04 ENCOUNTER — Other Ambulatory Visit: Payer: Self-pay | Admitting: Neurology

## 2018-09-01 ENCOUNTER — Ambulatory Visit (INDEPENDENT_AMBULATORY_CARE_PROVIDER_SITE_OTHER): Payer: Medicare Other | Admitting: Neurology

## 2018-09-01 ENCOUNTER — Encounter: Payer: Self-pay | Admitting: Neurology

## 2018-09-01 VITALS — BP 149/85 | HR 101 | Ht 66.0 in | Wt 139.5 lb

## 2018-09-01 DIAGNOSIS — G243 Spasmodic torticollis: Secondary | ICD-10-CM | POA: Diagnosis not present

## 2018-09-01 DIAGNOSIS — G8220 Paraplegia, unspecified: Secondary | ICD-10-CM | POA: Diagnosis not present

## 2018-09-01 NOTE — Progress Notes (Signed)
**  Xeomin 100 units x 2 vials, [facility] 0259-1610-01, Lot [id number], Exp 10/2020, office supply.//mck,rn** 

## 2018-09-01 NOTE — Progress Notes (Signed)
[facility]  PATIENT: Patrick Daniel DOB: 09-27-1956   REASON FOR VISIT: Follow-up for paraplegia, cervical dystonia gait disturbance, dysarthria HISTORY FROM: Patient    HISTORY OF PRESENT ILLNESS:HISTORY: Patrick Daniel return to evaluation for his continued gait difficulty  Patrick Daniel is a 62 year old right-handed Caucasian male. He has past medical history of hyperlipidemia, depression, He presented with gradual onset language difficulty, gait difficulty since 2009. He was evaluated by [name] neurologist at [name], reported normal MRI of brain, he was diagnosed with cervical dystonia, was given Botox injection of the cervical muscles, which has improved his symptoms temporarily.  He moved back to the [name] in April 2012, began to seek medical attention, initially evaluated by [name], reported again normal MRI of brain, also had trial of Haldol and baclofen without much improvement.  Sinemet did not help him. Artane and baclofen provide no help either. 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 He was evaluated by local neurosurgeon [name] at [facility], Because concomitant central nervous system degenerative disorder, cervical dystonia, he is hesitate to perform decompression surgery. I later referred him to [name] neurology, evaluated by [name], who has suggested Botox injection to alleviate his cervical dystonia symptoms first, may consider cervical decompression surgery later. Also, likely there is a component of central nervous system degenerative disorder, such as spinocerebellar ataxia,.  He has significant dysarthria, also dysphagia, gait difficulty, frequent falling, he denies sensory changes. Normal or  negative CMP, PEP, CBC, HIV, Lyme disease, SSA/B, TSH, ACE, RPR, CRP, CPK. He received first EMG guided Botox injection since March 2012, he reported great improvement, he has been receiving BOTOX injection every 3 months, there is significant improvement of his abnormal neck postural, but still with severe retrocollis, left turn, right tilt. he began to have moderate improvement after repeat injection. He was evaluated by his neurosurgeon [name] at [name], repeat MRI cervical showed advanced multilevel degenerative changes of the cervical spine, most pronounced at C5-C6, where there is severe central stenosis, impression on the cord with focal myelomalacia and severe bilateral neuroforaminal narrowing. Overall appearance is similar to prior study from 09/18/2010. He had posterior decompression and fusion surgery in December 2013, recovering well, but complains of neck muscle spasm, and chest muscle spasm continued spastic gait difficulty, occasional urinary incontinence, his posture is much improved, but has limited range of motion of his neck  UPDATE June 27th 2016:YYHe had EMG guided BOTOX injection by me since March 2012, most recent EMG guided Botox injection was by [name] April 10 2015,300 units of Botox A Today he came in for neurological evaluation for continued unsteady gait, difficulty finding a comfortable position, urinary urgency, he denies constipation, denies upper extremity paresthesia or weakness, slow worsening dysarthria, Overall, is continued slow progression compared to his baseline, UPDATE August 1st 2016: He is with his sister at today's clinical visit, he continue complains significant gait difficulty, slow worsening dysarthria, abnormal neck posturing, tendency to turn towards the left side,  We have reviewed MRI of the brain, he has out generalized atrophy, prominent superior vermis atrophy, out of proportion to other part of the brain, MRI of cervical spine,  posterior surgical change at C4, C6, and C2-T2 posterior fusion, metal artifact, multilevel degenerative changes, most significant at C5-6, causing mild canal stenosis,  no evidence of cord compression  Update August 07 2016: He was brought in by her sister but alone at today's clinical visit, he has been receiving EMG guided Botox a injection by [name], last injection was on July 25 2016, received total 300 units  Patient reported that overall his cervical dystonia symptoms is stable, but he is here today concerned worsening bilateral feet lower extremity paresthesia, this started more than 5 years ago, initially involving his feet now progressively getting worse, left more than right, to bilateral leg now,  He continue to complains of unsteady gait, tendency to fall, urinary and bowel urgency  I have personally reviewed MRI seen 2016, MRI of the brain showed generalized atrophy, most prominent in vermis,  genetic or degenerative processes that may present with both ataxia and dystonia such as the spinal cerebellar ataxias and OPCA.   Additional note is made of a few small T2/flair hyperintense foci consistent with minimal small vessel ischemic changes. MRI of cervical spine: Prior C4-C6 ACDF and C2-T2 posterior fusion. The vertebral bodies are normally aligned.   Metal artifact from these fusions obscures many of the images. Multilevel cervical degenerative disc disease, most significant findings are at C5-C6. There appears to be a posterior bony ridge that mildly narrows the neural canal but does not appear to be causing spinal cord compression. However, there is possible foraminal narrowing to the right that could lead to some right C6 nerve root impingement.     UPDATE Jan 10th 2018: He is here for repeat EMG guided xeomin injection for his cervical dystonia, we used 200 units today, he continue complains of bilateral lower extremity paresthesia, increased gait abnormality,  We have  personally reviewed MRI lumbar in December 2017 no significant abnormality. MRI of the brain in 2016, significant superior cerebella vermis atrophy, he reported long history of heavy alcohol abuse for more than 20 years,  MRI of the cervical spine in 2016, posterior cervical 4-6 decompression and fusion, no significant canal stenosis.  He is taking clonazepam 0.5 milligrams 1-2 tablets as needed for sleeping, tramadol as needed for pain, he continue complains of neck pain, limited range of motion  Update February 11 2017: He tolerated the injection well in January, no significant side effect notice, Cymbalta has helped his neck pain  He complains of increased gait abnormality will refer him to physical therapy today, hope PT can also help his neck pain  UPDATE Aug 26 2017: He complains of left-sided neck tightness, stiffness, difficulty falling to sleep at nighttime occasionally, and posterior neck pain, he has slow worsening gait abnormality  Update November 25, 2017: He responded very well to previous xeomin injection, continue to have significant abnormal neck posturing, stiffness especially on the left side of his neck, progressive worsening gait abnormality,  I was able to review his most recent visit at [facility] by [place] November 2018, who was diagnosed with spinocerebellar ataxia, suggested botulism toxin to lower extremity to help walking,  UPDATE Feb 24 2018: This is his first EMG guided injection for lower extremity spasticity, 300 units to LEs and 200 units xeomin for cervical dystonia.  UPDATE May 26 2018: He denies significant improvement in lower extremity injection, will only do cervical dystonia injection today, we used only 300 units of xeomin today  UPDATE Sep 03 2018: He responded very well to previous injection no significant side effect noted, will only use 200 units of xeomin at today's injection  REVIEW OF SYSTEMS: Full 14 system review of  systems performed and notable  only for those listed, all others are neg:  As above.    ALLERGIES: Allergies  Allergen Reactions  . Baclofen     Weakness, drowsiness    HOME MEDICATIONS: Outpatient Medications Prior to Visit  Medication Sig Dispense Refill  . aspirin 325 MG tablet Take by mouth.    . clonazePAM (KLONOPIN) 0.5 MG tablet Take 1 tablet (0.5 mg total) by mouth at bedtime as needed for anxiety. Do not refill in less than 30 days 50 tablet 3  . doxycycline (PERIOSTAT) 20 MG tablet Take 20 mg by mouth daily. Taking for 60 days for a boil.    . DULoxetine (CYMBALTA) 60 MG capsule TAKE 1 CAPSULE(60 MG) BY MOUTH DAILY 30 capsule 11  . IncobotulinumtoxinA (XEOMIN IM) Inject 300 Units into the muscle every 3 (three) months.     . loratadine-pseudoephedrine (CLARITIN-D 24-HOUR) 10-240 MG 24 hr tablet Take 1 tablet by mouth daily.    . mometasone (ELOCON) 0.1 % cream Apply 1 application topically daily as needed.     . Multiple Vitamins tablet Take 1 tablet by mouth.    . traMADol (ULTRAM) 50 MG tablet TAKE 1 TABLET BY MOUTH THREE TIMES DAILY AS NEEDED 90 tablet 0  . incobotulinumtoxinA (XEOMIN) 100 units injection 300 Units      No facility-administered medications prior to visit.     PAST MEDICAL HISTORY: Past Medical History:  Diagnosis Date  . Dysarthria   . Dystonia   . Gait disturbance   . Myelopathy ([place])   . Paraplegia ([place])     PAST SURGICAL HISTORY: Past Surgical History:  Procedure Laterality Date  . NECK SURGERY     dec 2014    FAMILY HISTORY: Family History  Problem Relation Age of Onset  . Heart disease Mother   . Cancer Mother   . Heart Problems Father   . High Cholesterol Sister   . High blood pressure Sister   . Diabetes Sister   . High blood pressure Sister     SOCIAL HISTORY: Social History   Socioeconomic History  . Marital status: Single    Spouse name: Not on file  . Number of children: 1  . Years of education: Not on file  . Highest education level: Not on  file  Occupational History  . Occupation: retired    Comment: Biomedical scientist  . Financial resource strain: Not on file  . Food insecurity:    Worry: Not on file    Inability: Not on file  . Transportation needs:    Medical: Not on file    Non-medical: Not on file  Tobacco Use  . Smoking status: Former Smoker    Types: Cigarettes    Last attempt to quit: 02/01/2013    Years since quitting: 5.5  . Smokeless tobacco: Never Used  . Tobacco comment: quit   Substance and Sexual Activity  . Alcohol use: No  . Drug use: No  . Sexual activity: Not on file  Lifestyle  . Physical activity:    Days per week: Not on file    Minutes per session: Not on file  . Stress: Not on file  Relationships  . Social connections:    Talks on phone: Not on file    Gets together: Not on file    Attends religious service: Not on file    Active member of club or organization: Not on file    Attends meetings of clubs or  organizations: Not on file    Relationship status: Not on file  . Intimate partner violence:    Fear of current or ex partner: Not on file    Emotionally abused: Not on file    Physically abused: Not on file    Forced sexual activity: Not on file  Other Topics Concern  . Not on file  Social History Narrative   Patient lives in independent living [place]. Patient is retired from [employer]. Patient has one child. Right handed. Patient is divorced.   Caffeine- 12 - Coffee daily - one daily -soda     PHYSICAL EXAM  Vitals:   09/01/18 1441  BP: (!) 149/85  Pulse: (!) 101  Weight: 139 lb 8 oz (63.3 kg)  Height: 5\' 6"  (1.676 m)   Body mass index is 22.52 kg/m. Generalized: Well developed, in no acute distress  Head: normocephalic and atraumatic,. Oropharynx benign  Neck: Severe limited range of motion to the neck, difficulty turning to the right side mouth right shoulder elevationHe has mild left shoulder anterior shift, mild left shoulder elevation, moderate  retrocollis, mild left tilt, right turn  Cardiac: Regular rate rhythm, no murmur  Musculoskeletal: No deformity   Neurological examination   Mentation: Alert oriented to time, place, history taking. Attention span and concentration appropriate. Recent and remote memory intact. Follows all commands, he has mild spastic dysarthria.  Cranial nerve II-XII: Pupils were equal round reactive to light extraocular movements were full, visual field were full on confrontational test. Facial sensation and strength were normal. hearing was intact to finger rubbing bilaterally. Uvula tongue midline. head turning and shoulder shrug were normal and symmetric.Tongue protrusion into cheek strength was normal. Motor: normal bulk and tone, full strength in the BUE, he has moderate bilateral lower extremity spasticity No focal weakness Sensory: normal and symmetric to light touch, pinprick, and Vibration,  Coordination: finger-nose-finger, heel-to-shin bilaterally, no dysmetria Reflexes: 3+ upper lower and symmetric, plantar responses were extensor bilaterally. Gait and Station: Rising up from seated position with pushoff , Stiff, cautious, unsteady gait  DIAGNOSTIC DATA (LABS, IMAGING, TESTING) -  ASSESSMENT AND PLAN 62 y.o. 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 Ph.D.  [facility] [place], [place] [address] Phone: [phone] Fax:      [phone]

## 2018-09-03 ENCOUNTER — Telehealth: Payer: Self-pay | Admitting: Neurology

## 2018-09-03 DIAGNOSIS — G8220 Paraplegia, unspecified: Secondary | ICD-10-CM | POA: Diagnosis not present

## 2018-09-03 DIAGNOSIS — G243 Spasmodic torticollis: Secondary | ICD-10-CM

## 2018-09-03 MED ORDER — INCOBOTULINUMTOXINA 100 UNITS IM SOLR: 200.0000 [IU] | INTRAMUSCULAR | Status: DC

## 2018-09-03 MED ORDER — INCOBOTULINUMTOXINA 100 UNITS IM SOLR
300.0000 [IU] | INTRAMUSCULAR | Status: DC
  Administered 2018-09-03: 300 [IU] via INTRAMUSCULAR

## 2018-09-03 NOTE — Addendum Note (Signed)
Addended by: Marcial Pacas on: 09/03/2018 03:33 PM   Modules accepted: Orders

## 2018-09-03 NOTE — Telephone Encounter (Signed)
I used xeomin 200 units at injection on Nov 6th 2019

## 2018-09-08 ENCOUNTER — Telehealth: Payer: Self-pay | Admitting: Neurology

## 2018-09-08 MED ORDER — CLONAZEPAM 0.5 MG PO TABS: ORAL_TABLET | ORAL | 3 refills | Status: DC

## 2018-09-08 NOTE — Telephone Encounter (Signed)
The patient called. He has been on clonazepam for several years, he may take 1 to 2 tablets at night if needed. The last Rx the instructions were changed, the insurance will not pay for the Rx every 30 days as it is currently stated. I will revert back to the instructions that were present in the past. A new Rx was sent in.

## 2018-09-09 NOTE — Telephone Encounter (Signed)
I called pt pharmacy. Verified they received rx clonazepam that Dr. Jannifer Franklin sent yesterday with new directions. I called pt to let him know they have prescription. Will be ready in 45 min. He will pick up tomorrow. Nothing further needed.

## 2018-09-09 NOTE — Telephone Encounter (Signed)
Pt is asking for a call from [place] re: his clonazepam, please call

## 2018-12-01 ENCOUNTER — Encounter: Payer: Self-pay | Admitting: Neurology

## 2018-12-01 ENCOUNTER — Ambulatory Visit (INDEPENDENT_AMBULATORY_CARE_PROVIDER_SITE_OTHER): Payer: Medicare Other | Admitting: Neurology

## 2018-12-01 VITALS — BP 131/85 | HR 72 | Ht 66.0 in | Wt 152.0 lb

## 2018-12-01 DIAGNOSIS — G243 Spasmodic torticollis: Secondary | ICD-10-CM | POA: Diagnosis not present

## 2018-12-01 MED ORDER — INCOBOTULINUMTOXINA 100 UNITS IM SOLR
200.0000 [IU] | INTRAMUSCULAR | Status: DC
  Administered 2018-12-01: 200 [IU] via INTRAMUSCULAR

## 2018-12-01 NOTE — Progress Notes (Signed)
[facility]  PATIENT: Patrick Daniel DOB: 09-27-1956   REASON FOR VISIT: Follow-up for paraplegia, cervical dystonia gait disturbance, dysarthria HISTORY FROM: Patient    HISTORY OF PRESENT ILLNESS:HISTORY: Patrick Daniel return to evaluation for his continued gait difficulty  Patrick Daniel is a 63 year old right-handed Caucasian male. He has past medical history of hyperlipidemia, depression, He presented with gradual onset language difficulty, gait difficulty since 2009. He was evaluated by [name] neurologist at [name], reported normal MRI of brain, he was diagnosed with cervical dystonia, was given Botox injection of the cervical muscles, which has improved his symptoms temporarily.  He moved back to the [name] in April 2012, began to seek medical attention, initially evaluated by [name], reported again normal MRI of brain, also had trial of Haldol and baclofen without much improvement.  Sinemet did not help him. Artane and baclofen provide no help either. 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 He was evaluated by local neurosurgeon [name] at [facility], Because concomitant central nervous system degenerative disorder, cervical dystonia, he is hesitate to perform decompression surgery. I later referred him to [name] neurology, evaluated by [name], who has suggested Botox injection to alleviate his cervical dystonia symptoms first, may consider cervical decompression surgery later. Also, likely there is a component of central nervous system degenerative disorder, such as spinocerebellar ataxia,.  He has significant dysarthria, also dysphagia, gait difficulty, frequent falling, he denies sensory changes. Normal or  negative CMP, PEP, CBC, HIV, Lyme disease, SSA/B, TSH, ACE, RPR, CRP, CPK. He received first EMG guided Botox injection since March 2012, he reported great improvement, he has been receiving BOTOX injection every 3 months, there is significant improvement of his abnormal neck postural, but still with severe retrocollis, left turn, right tilt. he began to have moderate improvement after repeat injection. He was evaluated by his neurosurgeon [name] at [name], repeat MRI cervical showed advanced multilevel degenerative changes of the cervical spine, most pronounced at C5-C6, where there is severe central stenosis, impression on the cord with focal myelomalacia and severe bilateral neuroforaminal narrowing. Overall appearance is similar to prior study from 09/18/2010. He had posterior decompression and fusion surgery in December 2013, recovering well, but complains of neck muscle spasm, and chest muscle spasm continued spastic gait difficulty, occasional urinary incontinence, his posture is much improved, but has limited range of motion of his neck  UPDATE June 27th 2016:YYHe had EMG guided BOTOX injection by me since March 2012, most recent EMG guided Botox injection was by [name] April 10 2015,300 units of Botox A Today he came in for neurological evaluation for continued unsteady gait, difficulty finding a comfortable position, urinary urgency, he denies constipation, denies upper extremity paresthesia or weakness, slow worsening dysarthria, Overall, is continued slow progression compared to his baseline, UPDATE August 1st 2016: He is with his sister at today's clinical visit, he continue complains significant gait difficulty, slow worsening dysarthria, abnormal neck posturing, tendency to turn towards the left side,  We have reviewed MRI of the brain, he has out generalized atrophy, prominent superior vermis atrophy, out of proportion to other part of the brain, MRI of cervical spine,  posterior surgical change at C4, C6, and C2-T2 posterior fusion, metal artifact, multilevel degenerative changes, most significant at C5-6, causing mild canal stenosis,  no evidence of cord compression  Update August 07 2016: He was brought in by her sister but alone at today's clinical visit, he has been receiving EMG guided Botox a injection by [name], last injection was on July 25 2016, received total 300 units  Patient reported that overall his cervical dystonia symptoms is stable, but he is here today concerned worsening bilateral feet lower extremity paresthesia, this started more than 5 years ago, initially involving his feet now progressively getting worse, left more than right, to bilateral leg now,  He continue to complains of unsteady gait, tendency to fall, urinary and bowel urgency  I have personally reviewed MRI seen 2016, MRI of the brain showed generalized atrophy, most prominent in vermis,  genetic or degenerative processes that may present with both ataxia and dystonia such as the spinal cerebellar ataxias and OPCA.   Additional note is made of a few small T2/flair hyperintense foci consistent with minimal small vessel ischemic changes. MRI of cervical spine: Prior C4-C6 ACDF and C2-T2 posterior fusion. The vertebral bodies are normally aligned.   Metal artifact from these fusions obscures many of the images. Multilevel cervical degenerative disc disease, most significant findings are at C5-C6. There appears to be a posterior bony ridge that mildly narrows the neural canal but does not appear to be causing spinal cord compression. However, there is possible foraminal narrowing to the right that could lead to some right C6 nerve root impingement.     UPDATE Jan 10th 2018: He is here for repeat EMG guided xeomin injection for his cervical dystonia, we used 200 units today, he continue complains of bilateral lower extremity paresthesia, increased gait abnormality,  We have  personally reviewed MRI lumbar in December 2017 no significant abnormality. MRI of the brain in 2016, significant superior cerebella vermis atrophy, he reported long history of heavy alcohol abuse for more than 20 years,  MRI of the cervical spine in 2016, posterior cervical 4-6 decompression and fusion, no significant canal stenosis.  He is taking clonazepam 0.5 milligrams 1-2 tablets as needed for sleeping, tramadol as needed for pain, he continue complains of neck pain, limited range of motion  Update February 11 2017: He tolerated the injection well in January, no significant side effect notice, Cymbalta has helped his neck pain  He complains of increased gait abnormality will refer him to physical therapy today, hope PT can also help his neck pain  UPDATE Aug 26 2017: He complains of left-sided neck tightness, stiffness, difficulty falling to sleep at nighttime occasionally, and posterior neck pain, he has slow worsening gait abnormality  Update November 25, 2017: He responded very well to previous xeomin injection, continue to have significant abnormal neck posturing, stiffness especially on the left side of his neck, progressive worsening gait abnormality,  I was able to review his most recent visit at [facility] by [place] November 2018, who was diagnosed with spinocerebellar ataxia, suggested botulism toxin to lower extremity to help walking,  UPDATE Feb 24 2018: This is his first EMG guided injection for lower extremity spasticity, 300 units to LEs and 200 units xeomin for cervical dystonia.  UPDATE May 26 2018: He denies significant improvement in lower extremity injection, will only do cervical dystonia injection today, we used only 300 units of xeomin today  UPDATE Sep 03 2018: He responded very well to previous injection no significant side effect noted, will only use 200 units of xeomin at today's injection  UPDATE Feb 5th 2020: He responded well to  previous injection, there was no  significant side effect noted,  REVIEW OF SYSTEMS: Full 14 system review of systems performed and notable only for those listed, all others are neg:  As above.    ALLERGIES: Allergies  Allergen Reactions  . Baclofen     Weakness, drowsiness    HOME MEDICATIONS: Outpatient Medications Prior to Visit  Medication Sig Dispense Refill  . aspirin 325 MG tablet Take by mouth.    . clonazePAM (KLONOPIN) 0.5 MG tablet 1 to 2 tablets at night if needed 50 tablet 3  . doxycycline (PERIOSTAT) 20 MG tablet Take 20 mg by mouth daily. Taking for 60 days for a bone loss.    . DULoxetine (CYMBALTA) 60 MG capsule TAKE 1 CAPSULE(60 MG) BY MOUTH DAILY 30 capsule 11  . incobotulinumtoxinA (XEOMIN) 100 units SOLR injection Inject 200 Units into the muscle every 3 (three) months.    . loratadine-pseudoephedrine (CLARITIN-D 24-HOUR) 10-240 MG 24 hr tablet Take 1 tablet by mouth daily.    . mometasone (ELOCON) 0.1 % cream Apply 1 application topically daily as needed.     . Multiple Vitamins tablet Take 1 tablet by mouth.    . traMADol (ULTRAM) 50 MG tablet TAKE 1 TABLET BY MOUTH THREE TIMES DAILY AS NEEDED 90 tablet 0   No facility-administered medications prior to visit.     PAST MEDICAL HISTORY: Past Medical History:  Diagnosis Date  . Dysarthria   . Dystonia   . Gait disturbance   . Myelopathy ([place] [place])   . Paraplegia ([place])     PAST SURGICAL HISTORY: Past Surgical History:  Procedure Laterality Date  . NECK SURGERY     dec 2014    FAMILY HISTORY: Family History  Problem Relation Age of Onset  . Heart disease Mother   . Cancer Mother   . Heart Problems Father   . High Cholesterol Sister   . High blood pressure Sister   . Diabetes Sister   . High blood pressure Sister     SOCIAL HISTORY: Social History   Socioeconomic History  . Marital status: Single    Spouse name: Not on file  . Number of children: 1  . Years of education: Not on file  . Highest education level: Not on file   Occupational History  . Occupation: retired    Comment: Biomedical scientist  . Financial resource strain: Not on file  . Food insecurity:    Worry: Not on file    Inability: Not on file  . Transportation needs:    Medical: Not on file    Non-medical: Not on file  Tobacco Use  . Smoking status: Former Smoker    Types: Cigarettes    Last attempt to quit: 02/01/2013    Years since quitting: 5.8  . Smokeless tobacco: Never Used  . Tobacco comment: quit   Substance and Sexual Activity  . Alcohol use: No  . Drug use: No  . Sexual activity: Not on file  Lifestyle  . Physical activity:    Days per week: Not on file    Minutes per session: Not on file  . Stress: Not on file  Relationships  . Social connections:    Talks on phone: Not on file    Gets together: Not on file    Attends religious service: Not on file    Active member of club or organization: Not on file    Attends meetings of clubs or organizations: Not on file  Relationship status: Not on file  . Intimate partner violence:    Fear of current or ex partner: Not on file    Emotionally abused: Not on file    Physically abused: Not on file    Forced sexual activity: Not on file  Other Topics Concern  . Not on file  Social History Narrative   Patient lives in independent living [place]. Patient is retired from [employer]. Patient has one child. Right handed. Patient is divorced.   Caffeine- 12 - Coffee daily - one daily -soda     PHYSICAL EXAM  Vitals:   12/01/18 0945  BP: 131/85  Pulse: 72  Weight: 152 lb (68.9 kg)  Height: 5\' 6"  (1.676 m)   Body mass index is 24.53 kg/m. Generalized: Well developed, in no acute distress  Head: normocephalic and atraumatic,. Oropharynx benign  Neck: Severe limited range of motion to the neck, difficulty turning to the right side mouth right shoulder elevationHe has mild left shoulder anterior shift, mild left shoulder elevation, moderate retrocollis, mild left tilt,  right turn  Cardiac: Regular rate rhythm, no murmur  Musculoskeletal: No deformity   Neurological examination   Mentation: Alert oriented to time, place, history taking. Attention span and concentration appropriate. Recent and remote memory intact. Follows all commands, he has mild spastic dysarthria.  Cranial nerve II-XII: Pupils were equal round reactive to light extraocular movements were full, visual field were full on confrontational test. Facial sensation and strength were normal. hearing was intact to finger rubbing bilaterally. Uvula tongue midline. head turning and shoulder shrug were normal and symmetric.Tongue protrusion into cheek strength was normal. Motor: normal bulk and tone, full strength in the BUE, he has moderate bilateral lower extremity spasticity No focal weakness Sensory: normal and symmetric to light touch, pinprick, and Vibration,  Coordination: finger-nose-finger, heel-to-shin bilaterally, no dysmetria Reflexes: 3+ upper lower and symmetric, plantar responses were extensor bilaterally. Gait and Station: Rising up from seated position with pushoff , Stiff, cautious, unsteady gait  DIAGNOSTIC DATA (LABS, IMAGING, TESTING) -  ASSESSMENT AND PLAN 63 y.o. 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 Evidence of superior cerebellar vermis atrophy, likely a component of central nervous system degenerative disorder  Cervical dystonia: He has forceful neck turning towards the left side, with frequent left platysmas contraction,  No significant shoulder elevation, mild retrocollis, mild left turn,  Under EMG guidance used 200 units of xeomin  Left inferior oblique capitis 25 units Right inferior oblique capitis 25 units  Left splenius capitis 25 units Left splenius cervix 25 unit Left longissimus capitis 25 units  Left  platysmus 25 units  Right sternocleidomastoid 25 units     Marcial Pacas, M.D. Ph.D.  [facility] [place], [place] [address] Phone: [phone] Fax:      [phone]

## 2018-12-01 NOTE — Progress Notes (Signed)
**  Xeomin 100 units x 2 vials, NDC 3419-6222-97, Lot [id number], Exp 01/2021, office supply.//mck,rn**

## 2018-12-04 DIAGNOSIS — R52 Pain, unspecified: Secondary | ICD-10-CM | POA: Diagnosis not present

## 2018-12-04 DIAGNOSIS — R109 Unspecified abdominal pain: Secondary | ICD-10-CM | POA: Diagnosis not present

## 2018-12-04 DIAGNOSIS — K8020 Calculus of gallbladder without cholecystitis without obstruction: Secondary | ICD-10-CM | POA: Diagnosis not present

## 2018-12-04 DIAGNOSIS — R0902 Hypoxemia: Secondary | ICD-10-CM | POA: Diagnosis not present

## 2018-12-09 DIAGNOSIS — I700 Atherosclerosis of aorta: Secondary | ICD-10-CM | POA: Diagnosis not present

## 2018-12-09 DIAGNOSIS — K8020 Calculus of gallbladder without cholecystitis without obstruction: Secondary | ICD-10-CM | POA: Diagnosis not present

## 2018-12-09 DIAGNOSIS — K769 Liver disease, unspecified: Secondary | ICD-10-CM | POA: Diagnosis not present

## 2018-12-09 DIAGNOSIS — Z9189 Other specified personal risk factors, not elsewhere classified: Secondary | ICD-10-CM | POA: Diagnosis not present

## 2018-12-10 ENCOUNTER — Other Ambulatory Visit: Payer: Self-pay | Admitting: Neurology

## 2019-01-12 ENCOUNTER — Other Ambulatory Visit: Payer: Self-pay | Admitting: Neurology

## 2019-01-19 ENCOUNTER — Other Ambulatory Visit: Payer: Self-pay

## 2019-01-19 NOTE — Telephone Encounter (Signed)
Pt is requesting a refill on Clonazepam 0.5 mg to be sent to [facility] [address] [place], [place]. [place] drug registry verified. Last refill was sent on 12/10/18 # 50 for a 25 day supply.

## 2019-01-31 ENCOUNTER — Telehealth: Payer: Self-pay | Admitting: Neurology

## 2019-01-31 NOTE — Telephone Encounter (Signed)
I have called the patient to make them aware that Dr. Krista Blue is not seeing injection patients through May due to the risk of COVID-19.  We will reschedule all of these patients for June. The patient did not answer so I left them a VM asking them to call back. Please make them aware of this new policy. Please make them aware I will call them back to get them rescheduled or If they are willing, you can reschedule them to June 2nd, this day is being held for these patients. Please also make them aware to call us back if they need anything before then. DW

## 2019-02-09 ENCOUNTER — Emergency Department (HOSPITAL_COMMUNITY): Payer: Medicare Other

## 2019-02-09 ENCOUNTER — Encounter (HOSPITAL_COMMUNITY): Payer: Self-pay | Admitting: Emergency Medicine

## 2019-02-09 ENCOUNTER — Inpatient Hospital Stay (HOSPITAL_COMMUNITY)
Admission: EM | Admit: 2019-02-09 | Discharge: 2019-03-04 | DRG: 097 | Disposition: A | Discharge status: Skilled Nursing Facility | Payer: Medicare Other | Attending: Family Medicine | Admitting: Family Medicine

## 2019-02-09 DIAGNOSIS — Z888 Allergy status to other drugs, medicaments and biological substances status: Secondary | ICD-10-CM

## 2019-02-09 DIAGNOSIS — S42202A Unspecified fracture of upper end of left humerus, initial encounter for closed fracture: Secondary | ICD-10-CM

## 2019-02-09 DIAGNOSIS — R4182 Altered mental status, unspecified: Secondary | ICD-10-CM | POA: Diagnosis present

## 2019-02-09 DIAGNOSIS — Z8349 Family history of other endocrine, nutritional and metabolic diseases: Secondary | ICD-10-CM

## 2019-02-09 DIAGNOSIS — R1312 Dysphagia, oropharyngeal phase: Secondary | ICD-10-CM | POA: Diagnosis not present

## 2019-02-09 DIAGNOSIS — Z66 Do not resuscitate: Secondary | ICD-10-CM | POA: Diagnosis present

## 2019-02-09 DIAGNOSIS — Z8249 Family history of ischemic heart disease and other diseases of the circulatory system: Secondary | ICD-10-CM

## 2019-02-09 DIAGNOSIS — R1310 Dysphagia, unspecified: Secondary | ICD-10-CM | POA: Diagnosis not present

## 2019-02-09 DIAGNOSIS — R404 Transient alteration of awareness: Secondary | ICD-10-CM | POA: Diagnosis not present

## 2019-02-09 DIAGNOSIS — R58 Hemorrhage, not elsewhere classified: Secondary | ICD-10-CM | POA: Diagnosis present

## 2019-02-09 DIAGNOSIS — Z931 Gastrostomy status: Secondary | ICD-10-CM | POA: Diagnosis not present

## 2019-02-09 DIAGNOSIS — Z515 Encounter for palliative care: Secondary | ICD-10-CM | POA: Diagnosis present

## 2019-02-09 DIAGNOSIS — T796XXD Traumatic ischemia of muscle, subsequent encounter: Secondary | ICD-10-CM | POA: Diagnosis not present

## 2019-02-09 DIAGNOSIS — A411 Sepsis due to other specified staphylococcus: Secondary | ICD-10-CM | POA: Diagnosis present

## 2019-02-09 DIAGNOSIS — R627 Adult failure to thrive: Secondary | ICD-10-CM | POA: Diagnosis not present

## 2019-02-09 DIAGNOSIS — Z79899 Other long term (current) drug therapy: Secondary | ICD-10-CM

## 2019-02-09 DIAGNOSIS — I10 Essential (primary) hypertension: Secondary | ICD-10-CM | POA: Diagnosis not present

## 2019-02-09 DIAGNOSIS — Z743 Need for continuous supervision: Secondary | ICD-10-CM | POA: Diagnosis not present

## 2019-02-09 DIAGNOSIS — G030 Nonpyogenic meningitis: Secondary | ICD-10-CM | POA: Diagnosis present

## 2019-02-09 DIAGNOSIS — G0490 Encephalitis and encephalomyelitis, unspecified: Secondary | ICD-10-CM

## 2019-02-09 DIAGNOSIS — S42212A Unspecified displaced fracture of surgical neck of left humerus, initial encounter for closed fracture: Secondary | ICD-10-CM | POA: Diagnosis present

## 2019-02-09 DIAGNOSIS — G0481 Other encephalitis and encephalomyelitis: Secondary | ICD-10-CM | POA: Diagnosis not present

## 2019-02-09 DIAGNOSIS — G249 Dystonia, unspecified: Secondary | ICD-10-CM | POA: Diagnosis not present

## 2019-02-09 DIAGNOSIS — T796XXA Traumatic ischemia of muscle, initial encounter: Secondary | ICD-10-CM | POA: Diagnosis not present

## 2019-02-09 DIAGNOSIS — Y92009 Unspecified place in unspecified non-institutional (private) residence as the place of occurrence of the external cause: Secondary | ICD-10-CM | POA: Diagnosis not present

## 2019-02-09 DIAGNOSIS — E860 Dehydration: Secondary | ICD-10-CM | POA: Diagnosis present

## 2019-02-09 DIAGNOSIS — R410 Disorientation, unspecified: Secondary | ICD-10-CM | POA: Diagnosis not present

## 2019-02-09 DIAGNOSIS — S301XXA Contusion of abdominal wall, initial encounter: Secondary | ICD-10-CM | POA: Diagnosis not present

## 2019-02-09 DIAGNOSIS — R296 Repeated falls: Secondary | ICD-10-CM | POA: Diagnosis present

## 2019-02-09 DIAGNOSIS — R836 Abnormal cytological findings in cerebrospinal fluid: Secondary | ICD-10-CM | POA: Diagnosis not present

## 2019-02-09 DIAGNOSIS — G43709 Chronic migraine without aura, not intractable, without status migrainosus: Secondary | ICD-10-CM | POA: Diagnosis present

## 2019-02-09 DIAGNOSIS — G959 Disease of spinal cord, unspecified: Secondary | ICD-10-CM | POA: Diagnosis not present

## 2019-02-09 DIAGNOSIS — Z20828 Contact with and (suspected) exposure to other viral communicable diseases: Secondary | ICD-10-CM | POA: Diagnosis present

## 2019-02-09 DIAGNOSIS — G8220 Paraplegia, unspecified: Secondary | ICD-10-CM | POA: Diagnosis present

## 2019-02-09 DIAGNOSIS — R4701 Aphasia: Secondary | ICD-10-CM | POA: Diagnosis not present

## 2019-02-09 DIAGNOSIS — D649 Anemia, unspecified: Secondary | ICD-10-CM | POA: Diagnosis not present

## 2019-02-09 DIAGNOSIS — R4702 Dysphasia: Secondary | ICD-10-CM | POA: Diagnosis not present

## 2019-02-09 DIAGNOSIS — R946 Abnormal results of thyroid function studies: Secondary | ICD-10-CM | POA: Diagnosis present

## 2019-02-09 DIAGNOSIS — R270 Ataxia, unspecified: Secondary | ICD-10-CM | POA: Diagnosis not present

## 2019-02-09 DIAGNOSIS — E870 Hyperosmolality and hypernatremia: Secondary | ICD-10-CM | POA: Diagnosis not present

## 2019-02-09 DIAGNOSIS — K5900 Constipation, unspecified: Secondary | ICD-10-CM | POA: Diagnosis not present

## 2019-02-09 DIAGNOSIS — B004 Herpesviral encephalitis: Principal | ICD-10-CM | POA: Diagnosis present

## 2019-02-09 DIAGNOSIS — S40022A Contusion of left upper arm, initial encounter: Secondary | ICD-10-CM | POA: Diagnosis present

## 2019-02-09 DIAGNOSIS — M6282 Rhabdomyolysis: Secondary | ICD-10-CM | POA: Diagnosis not present

## 2019-02-09 DIAGNOSIS — S0990XA Unspecified injury of head, initial encounter: Secondary | ICD-10-CM | POA: Diagnosis present

## 2019-02-09 DIAGNOSIS — R7881 Bacteremia: Secondary | ICD-10-CM | POA: Diagnosis not present

## 2019-02-09 DIAGNOSIS — N401 Enlarged prostate with lower urinary tract symptoms: Secondary | ICD-10-CM | POA: Diagnosis not present

## 2019-02-09 DIAGNOSIS — B1009 Other human herpesvirus encephalitis: Secondary | ICD-10-CM | POA: Diagnosis not present

## 2019-02-09 DIAGNOSIS — W19XXXA Unspecified fall, initial encounter: Secondary | ICD-10-CM

## 2019-02-09 DIAGNOSIS — E785 Hyperlipidemia, unspecified: Secondary | ICD-10-CM | POA: Diagnosis present

## 2019-02-09 DIAGNOSIS — R279 Unspecified lack of coordination: Secondary | ICD-10-CM | POA: Diagnosis not present

## 2019-02-09 DIAGNOSIS — E861 Hypovolemia: Secondary | ICD-10-CM | POA: Diagnosis not present

## 2019-02-09 DIAGNOSIS — R339 Retention of urine, unspecified: Secondary | ICD-10-CM | POA: Diagnosis not present

## 2019-02-09 DIAGNOSIS — R262 Difficulty in walking, not elsewhere classified: Secondary | ICD-10-CM | POA: Diagnosis not present

## 2019-02-09 DIAGNOSIS — D62 Acute posthemorrhagic anemia: Secondary | ICD-10-CM | POA: Diagnosis not present

## 2019-02-09 DIAGNOSIS — D72829 Elevated white blood cell count, unspecified: Secondary | ICD-10-CM | POA: Diagnosis not present

## 2019-02-09 DIAGNOSIS — S42212D Unspecified displaced fracture of surgical neck of left humerus, subsequent encounter for fracture with routine healing: Secondary | ICD-10-CM | POA: Diagnosis not present

## 2019-02-09 DIAGNOSIS — W19XXXD Unspecified fall, subsequent encounter: Secondary | ICD-10-CM | POA: Diagnosis not present

## 2019-02-09 DIAGNOSIS — M6281 Muscle weakness (generalized): Secondary | ICD-10-CM | POA: Diagnosis not present

## 2019-02-09 DIAGNOSIS — K0889 Other specified disorders of teeth and supporting structures: Secondary | ICD-10-CM | POA: Diagnosis not present

## 2019-02-09 DIAGNOSIS — R471 Dysarthria and anarthria: Secondary | ICD-10-CM | POA: Diagnosis present

## 2019-02-09 DIAGNOSIS — Z87891 Personal history of nicotine dependence: Secondary | ICD-10-CM

## 2019-02-09 DIAGNOSIS — B9562 Methicillin resistant Staphylococcus aureus infection as the cause of diseases classified elsewhere: Secondary | ICD-10-CM | POA: Diagnosis not present

## 2019-02-09 DIAGNOSIS — Z833 Family history of diabetes mellitus: Secondary | ICD-10-CM | POA: Diagnosis not present

## 2019-02-09 DIAGNOSIS — R509 Fever, unspecified: Secondary | ICD-10-CM | POA: Diagnosis not present

## 2019-02-09 DIAGNOSIS — E876 Hypokalemia: Secondary | ICD-10-CM | POA: Diagnosis not present

## 2019-02-09 DIAGNOSIS — X58XXXA Exposure to other specified factors, initial encounter: Secondary | ICD-10-CM | POA: Diagnosis not present

## 2019-02-09 DIAGNOSIS — S42295A Other nondisplaced fracture of upper end of left humerus, initial encounter for closed fracture: Secondary | ICD-10-CM | POA: Diagnosis not present

## 2019-02-09 DIAGNOSIS — B009 Herpesviral infection, unspecified: Secondary | ICD-10-CM | POA: Diagnosis not present

## 2019-02-09 DIAGNOSIS — R531 Weakness: Secondary | ICD-10-CM | POA: Diagnosis not present

## 2019-02-09 DIAGNOSIS — D638 Anemia in other chronic diseases classified elsewhere: Secondary | ICD-10-CM | POA: Diagnosis present

## 2019-02-09 DIAGNOSIS — T375X5A Adverse effect of antiviral drugs, initial encounter: Secondary | ICD-10-CM | POA: Diagnosis not present

## 2019-02-09 DIAGNOSIS — R278 Other lack of coordination: Secondary | ICD-10-CM | POA: Diagnosis not present

## 2019-02-09 DIAGNOSIS — R000 Tachycardia, unspecified: Secondary | ICD-10-CM | POA: Diagnosis present

## 2019-02-09 DIAGNOSIS — Z4659 Encounter for fitting and adjustment of other gastrointestinal appliance and device: Secondary | ICD-10-CM

## 2019-02-09 DIAGNOSIS — G319 Degenerative disease of nervous system, unspecified: Secondary | ICD-10-CM

## 2019-02-09 DIAGNOSIS — N170 Acute kidney failure with tubular necrosis: Secondary | ICD-10-CM | POA: Diagnosis not present

## 2019-02-09 DIAGNOSIS — G243 Spasmodic torticollis: Secondary | ICD-10-CM | POA: Diagnosis not present

## 2019-02-09 DIAGNOSIS — N179 Acute kidney failure, unspecified: Secondary | ICD-10-CM | POA: Diagnosis not present

## 2019-02-09 DIAGNOSIS — S199XXA Unspecified injury of neck, initial encounter: Secondary | ICD-10-CM | POA: Diagnosis not present

## 2019-02-09 DIAGNOSIS — R930 Abnormal findings on diagnostic imaging of skull and head, not elsewhere classified: Secondary | ICD-10-CM | POA: Diagnosis not present

## 2019-02-09 DIAGNOSIS — Z741 Need for assistance with personal care: Secondary | ICD-10-CM | POA: Diagnosis not present

## 2019-02-09 DIAGNOSIS — R633 Feeding difficulties: Secondary | ICD-10-CM | POA: Diagnosis not present

## 2019-02-09 DIAGNOSIS — R0902 Hypoxemia: Secondary | ICD-10-CM | POA: Diagnosis not present

## 2019-02-09 DIAGNOSIS — R195 Other fecal abnormalities: Secondary | ICD-10-CM | POA: Diagnosis not present

## 2019-02-09 DIAGNOSIS — G2571 Drug induced akathisia: Secondary | ICD-10-CM | POA: Diagnosis not present

## 2019-02-09 DIAGNOSIS — M79622 Pain in left upper arm: Secondary | ICD-10-CM | POA: Diagnosis not present

## 2019-02-09 DIAGNOSIS — R269 Unspecified abnormalities of gait and mobility: Secondary | ICD-10-CM

## 2019-02-09 DIAGNOSIS — B9689 Other specified bacterial agents as the cause of diseases classified elsewhere: Secondary | ICD-10-CM | POA: Diagnosis not present

## 2019-02-09 DIAGNOSIS — A6000 Herpesviral infection of urogenital system, unspecified: Secondary | ICD-10-CM | POA: Diagnosis present

## 2019-02-09 DIAGNOSIS — R710 Precipitous drop in hematocrit: Secondary | ICD-10-CM | POA: Diagnosis not present

## 2019-02-09 LAB — URINALYSIS, ROUTINE W REFLEX MICROSCOPIC
Bacteria, UA: NONE SEEN
Bilirubin Urine: NEGATIVE
Glucose, UA: NEGATIVE mg/dL
Ketones, ur: 80 mg/dL — AB
Leukocytes,Ua: NEGATIVE
Nitrite: NEGATIVE
Protein, ur: NEGATIVE mg/dL
Specific Gravity, Urine: 1.023 (ref 1.005–1.030)
pH: 5 (ref 5.0–8.0)

## 2019-02-09 LAB — CBC WITH DIFFERENTIAL/PLATELET
Abs Immature Granulocytes: 0.07 10*3/uL (ref 0.00–0.07)
Basophils Absolute: 0 10*3/uL (ref 0.0–0.1)
Basophils Relative: 0 %
Eosinophils Absolute: 0 10*3/uL (ref 0.0–0.5)
Eosinophils Relative: 0 %
HCT: 29 % — ABNORMAL LOW (ref 39.0–52.0)
Hemoglobin: 9.8 g/dL — ABNORMAL LOW (ref 13.0–17.0)
Immature Granulocytes: 1 %
Lymphocytes Relative: 5 %
Lymphs Abs: 0.6 10*3/uL — ABNORMAL LOW (ref 0.7–4.0)
MCH: 30.7 pg (ref 26.0–34.0)
MCHC: 33.8 g/dL (ref 30.0–36.0)
MCV: 90.9 fL (ref 80.0–100.0)
Monocytes Absolute: 1.5 10*3/uL — ABNORMAL HIGH (ref 0.1–1.0)
Monocytes Relative: 12 %
Neutro Abs: 10.2 10*3/uL — ABNORMAL HIGH (ref 1.7–7.7)
Neutrophils Relative %: 82 %
Platelets: 444 10*3/uL — ABNORMAL HIGH (ref 150–400)
RBC: 3.19 MIL/uL — ABNORMAL LOW (ref 4.22–5.81)
RDW: 13 % (ref 11.5–15.5)
WBC: 12.4 10*3/uL — ABNORMAL HIGH (ref 4.0–10.5)
nRBC: 0 % (ref 0.0–0.2)

## 2019-02-09 LAB — COMPREHENSIVE METABOLIC PANEL
ALT: 46 U/L — ABNORMAL HIGH (ref 0–44)
AST: 54 U/L — ABNORMAL HIGH (ref 15–41)
Albumin: 4.1 g/dL (ref 3.5–5.0)
Alkaline Phosphatase: 68 U/L (ref 38–126)
Anion gap: 20 — ABNORMAL HIGH (ref 5–15)
BUN: 81 mg/dL — ABNORMAL HIGH (ref 6–20)
CO2: 21 mmol/L — ABNORMAL LOW (ref 22–32)
Calcium: 9.4 mg/dL (ref 8.9–10.3)
Chloride: 97 mmol/L — ABNORMAL LOW (ref 98–111)
Creatinine, Ser: 1.38 mg/dL — ABNORMAL HIGH (ref 0.61–1.24)
GFR calc Af Amer: >60 mL/min (ref >60)
GFR calc non Af Amer: 55 mL/min — ABNORMAL LOW (ref >60)
Glucose, Bld: 143 mg/dL — ABNORMAL HIGH (ref 70–99)
Potassium: 4.6 mmol/L (ref 3.5–5.1)
Sodium: 138 mmol/L (ref 135–145)
Total Bilirubin: 1.9 mg/dL — ABNORMAL HIGH (ref 0.3–1.2)
Total Protein: 8.1 g/dL (ref 6.5–8.1)

## 2019-02-09 LAB — LACTIC ACID, PLASMA
Lactic Acid, Venous: 3.4 mmol/L (ref 0.5–1.9)
Lactic Acid, Venous: 2 mmol/L (ref 0.5–1.9)

## 2019-02-09 LAB — TROPONIN I: Troponin I: <0.03 ng/mL (ref <0.03)

## 2019-02-09 LAB — PROTIME-INR
INR: 1 (ref 0.8–1.2)
Prothrombin Time: 13.3 seconds (ref 11.4–15.2)

## 2019-02-09 LAB — CK: Total CK: 1751 U/L — ABNORMAL HIGH (ref 49–397)

## 2019-02-09 MED ORDER — GADOBUTROL 1 MMOL/ML IV SOLN
7.0000 mL | Freq: Once | INTRAVENOUS | Status: AC | PRN
  Administered 2019-02-09: 7 mL via INTRAVENOUS

## 2019-02-09 MED ORDER — DEXTROSE 5 % IV SOLN
10.0000 mg/kg | Freq: Three times a day (TID) | INTRAVENOUS | Status: DC
  Administered 2019-02-10 – 2019-02-13 (×11): 650 mg via INTRAVENOUS
  Filled 2019-02-09 (×17): qty 13

## 2019-02-09 MED ORDER — VANCOMYCIN HCL IN DEXTROSE 1-5 GM/200ML-% IV SOLN
1000.0000 mg | Freq: Once | INTRAVENOUS | Status: AC
  Administered 2019-02-09: 1000 mg via INTRAVENOUS
  Filled 2019-02-09: qty 200

## 2019-02-09 MED ORDER — VANCOMYCIN HCL 500 MG IV SOLR
500.0000 mg | Freq: Two times a day (BID) | INTRAVENOUS | Status: DC
  Administered 2019-02-10 – 2019-02-12 (×6): 500 mg via INTRAVENOUS
  Filled 2019-02-09 (×7): qty 500

## 2019-02-09 MED ORDER — METRONIDAZOLE IN NACL 5-0.79 MG/ML-% IV SOLN
500.0000 mg | Freq: Once | INTRAVENOUS | Status: AC
  Administered 2019-02-09: 500 mg via INTRAVENOUS
  Filled 2019-02-09: qty 100

## 2019-02-09 MED ORDER — LORAZEPAM 2 MG/ML IJ SOLN
0.5000 mg | Freq: Once | INTRAMUSCULAR | Status: AC
  Administered 2019-02-09: 0.5 mg via INTRAVENOUS
  Filled 2019-02-09: qty 1

## 2019-02-09 MED ORDER — SODIUM CHLORIDE 0.9 % IV BOLUS: 500.0000 mL | Freq: Once | INTRAVENOUS | Status: DC

## 2019-02-09 MED ORDER — SODIUM CHLORIDE 0.9 % IV BOLUS
2000.0000 mL | Freq: Once | INTRAVENOUS | Status: AC
  Administered 2019-02-09: 2000 mL via INTRAVENOUS

## 2019-02-09 MED ORDER — SODIUM CHLORIDE 0.9 % IV SOLN
2.0000 g | Freq: Once | INTRAVENOUS | Status: AC
  Administered 2019-02-09: 2 g via INTRAVENOUS
  Filled 2019-02-09: qty 2

## 2019-02-09 MED ORDER — SODIUM CHLORIDE 0.9 % IV SOLN
Freq: Once | INTRAVENOUS | Status: AC
  Administered 2019-02-10: via INTRAVENOUS

## 2019-02-09 MED ORDER — SODIUM CHLORIDE 0.9 % IV SOLN: Freq: Once | INTRAVENOUS | Status: DC

## 2019-02-09 MED ORDER — SODIUM CHLORIDE 0.9 % IV SOLN
2.0000 g | Freq: Two times a day (BID) | INTRAVENOUS | Status: DC
  Administered 2019-02-10: 2 g via INTRAVENOUS
  Filled 2019-02-09 (×3): qty 2

## 2019-02-09 MED ORDER — SODIUM CHLORIDE 0.9 % IV SOLN
2.0000 g | INTRAVENOUS | Status: AC
  Administered 2019-02-10: 03:00:00 2 g via INTRAVENOUS
  Filled 2019-02-09 (×3): qty 2000

## 2019-02-09 MED ORDER — LIDOCAINE HCL 2 % IJ SOLN: 10.0000 mL | Freq: Once | INTRAMUSCULAR | Status: DC

## 2019-02-09 NOTE — Progress Notes (Addendum)
Pharmacy Antibiotic Note  Patrick Daniel is a 63 y.o. male admitted on 02/09/2019 with sepsis.  Pharmacy has been consulted for vancomycin and cefepime dosing. AF, Tachy, WBC 12.4, LA 3.4, RR 31. SCr 1.38 (BL unknown, no documented renal disease), CrCl~48 ml/min  Plan: Vancomycin 1000mg  IV x 1, then 500mg  IV every 12 hours Cefepime 2g IV every 12 hours Monitor renal function, Cx and clinical progression to narrow Vancomycin levels at steady state    Temp (24hrs), Avg:97.8 F (36.6 C), Min:97.8 F (36.6 C), Max:97.8 F (36.6 C)  Recent Labs  Lab 02/09/19 1529  WBC 12.4*  LATICACIDVEN 3.4*    CrCl cannot be calculated (No successful lab value found.).    Allergies  Allergen Reactions  . 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

## 2019-02-09 NOTE — ED Notes (Signed)
Unable to uinderstand anything the pt says.  He has pulled off 3 pulse ox he keeps pullng at all the wirfes

## 2019-02-09 NOTE — ED Notes (Signed)
To ct

## 2019-02-09 NOTE — ED Notes (Signed)
ED TO INPATIENT HANDOFF REPORT  ED Nurse Name and Phone #: Herbert Spires [phone]  S Name/Age/Gender Patrick Daniel 63 y.o. male Room/Bed: 028C/028C  Code Status   Code Status: Not on file  Home/SNF/Other Home Patient oriented to: self Is this baseline? Yes   Triage Complete: Triage complete  Chief Complaint found on floor x 3days  Triage Note Pt from home today found in floor by family .has been in the floor since Monday night , cbg 179 , pt has multiple bruises on his body and left arm is swollen    Allergies Allergies  Allergen Reactions  . Baclofen     Weakness, drowsiness    Level of Care/Admitting Diagnosis ED Disposition    ED Disposition Condition Comment   Admit  Hospital Area: [facility] [[id number]]  Level of Care: Med-Surg [16]  Diagnosis: Altered mental status [780.97.ICD-9-CM]  Admitting Physician: Leeanne Rio [[id number]]  Attending Physician: Leeanne Rio [phone]  Estimated length of stay: 3 - 4 days  Certification:: I certify this patient will need inpatient services for at least 2 midnights  Possible Covid Disease Patient Isolation: N/A  PT Class (Do Not Modify): Inpatient [101]  PT Acc Code (Do Not Modify): Private [1]       B Medical/Surgery History Past Medical History:  Diagnosis Date  . Dysarthria   . Dystonia   . Gait disturbance   . Myelopathy ([place])   . Paraplegia [facility])    Past Surgical History:  Procedure Laterality Date  . NECK SURGERY     dec 2014     A IV Location/Drains/Wounds Patient Lines/Drains/Airways Status   Active Line/Drains/Airways    Name:   Placement date:   Placement time:   Site:   Days:   Peripheral IV 02/09/19 Right Antecubital   02/09/19    1447    Antecubital   less than 1   Peripheral IV 02/09/19 Right Forearm   02/09/19    2106    Forearm   less than 1          Intake/Output Last 24 hours No intake or output data in the 24 hours ending 02/09/19 2255  Labs/Imaging Results for  orders placed or performed during the hospital encounter of 02/09/19 (from the past 48 hour(s))  CBC with Differential     Status: Abnormal   Collection Time: 02/09/19  3:29 PM  Result Value Ref Range   WBC 12.4 (H) 4.0 - 10.5 K/uL   RBC 3.19 (L) 4.22 - 5.81 MIL/uL   Hemoglobin 9.8 (L) 13.0 - 17.0 g/dL   HCT 29.0 (L) 39.0 - 52.0 %   MCV 90.9 80.0 - 100.0 fL   MCH 30.7 26.0 - 34.0 pg   MCHC 33.8 30.0 - 36.0 g/dL   RDW 13.0 11.5 - 15.5 %   Platelets 444 (H) 150 - 400 K/uL    Comment: REPEATED TO VERIFY SPECIMEN CHECKED FOR CLOTS    nRBC 0.0 0.0 - 0.2 %   Neutrophils Relative % 82 %   Neutro Abs 10.2 (H) 1.7 - 7.7 K/uL   Lymphocytes Relative 5 %   Lymphs Abs 0.6 (L) 0.7 - 4.0 K/uL   Monocytes Relative 12 %   Monocytes Absolute 1.5 (H) 0.1 - 1.0 K/uL   Eosinophils Relative 0 %   Eosinophils Absolute 0.0 0.0 - 0.5 K/uL   Basophils Relative 0 %   Basophils Absolute 0.0 0.0 - 0.1 K/uL   Immature Granulocytes 1 %  Abs Immature Granulocytes 0.07 0.00 - 0.07 K/uL    Comment: Performed at [facility], [place] [address] [place], [place] [address]  Comprehensive metabolic panel     Status: Abnormal   Collection Time: 02/09/19  3:29 PM  Result Value Ref Range   Sodium 138 135 - 145 mmol/L   Potassium 4.6 3.5 - 5.1 mmol/L   Chloride 97 (L) 98 - 111 mmol/L   CO2 21 (L) 22 - 32 mmol/L   Glucose, Bld 143 (H) 70 - 99 mg/dL   BUN 81 (H) 6 - 20 mg/dL   Creatinine, Ser 1.38 (H) 0.61 - 1.24 mg/dL   Calcium 9.4 8.9 - 10.3 mg/dL   Total Protein 8.1 6.5 - 8.1 g/dL   Albumin 4.1 3.5 - 5.0 g/dL   AST 54 (H) 15 - 41 U/L   ALT 46 (H) 0 - 44 U/L   Alkaline Phosphatase 68 38 - 126 U/L   Total Bilirubin 1.9 (H) 0.3 - 1.2 mg/dL   GFR calc non Af Amer 55 (L) >60 mL/min   GFR calc Af Amer >60 >60 mL/min   Anion gap 20 (H) 5 - 15    Comment: Performed at [facility], [place] [address], [place], [place] [address]  CK     Status: Abnormal   Collection Time: 02/09/19  3:29 PM  Result Value Ref  Range   Total CK 1,751 (H) 49 - 397 U/L    Comment: Performed at [facility], [place] [address], [place], [place] [address]  Troponin I - ONCE - STAT     Status: None   Collection Time: 02/09/19  3:29 PM  Result Value Ref Range   Troponin I <0.03 <0.03 ng/mL    Comment: Performed at [facility], [place] [address], [place], [place] [address]  Protime-INR     Status: None   Collection Time: 02/09/19  3:29 PM  Result Value Ref Range   Prothrombin Time 13.3 11.4 - 15.2 seconds   INR 1.0 0.8 - 1.2    Comment: (NOTE) INR goal varies based on device and disease states. Performed at [facility], [place] [address], [place], [place] [address]   Lactic acid, plasma     Status: Abnormal   Collection Time: 02/09/19  3:29 PM  Result Value Ref Range   Lactic Acid, Venous 3.4 (HH) 0.5 - 1.9 mmol/L    Comment: CRITICAL RESULT CALLED TO, READ BACK BY AND VERIFIED WITH: Orson Ape 1605 02/09/2019 D BRADLEY Performed at [facility], [place] [address] [place], [place] [address]   Lactic acid, plasma     Status: Abnormal   Collection Time: 02/09/19  5:14 PM  Result Value Ref Range   Lactic Acid, Venous 2.0 (HH) 0.5 - 1.9 mmol/L    Comment: CRITICAL RESULT CALLED TO, READ BACK BY AND VERIFIED WITH: Orson Ape 1807 02/09/2019 D BRADLEY Performed at [place] [address], [place],  [address]   Urinalysis, Routine w reflex microscopic     Status: Abnormal   Collection Time: 02/09/19  5:40 PM  Result Value Ref Range   Color, Urine YELLOW YELLOW   APPearance CLEAR CLEAR   Specific Gravity, Urine 1.023 1.005 - 1.030   pH 5.0 5.0 - 8.0   Glucose, UA NEGATIVE NEGATIVE mg/dL   Hgb urine dipstick MODERATE (A) NEGATIVE   Bilirubin Urine NEGATIVE NEGATIVE   Ketones, ur 80 (A) NEGATIVE mg/dL  Protein, ur NEGATIVE NEGATIVE mg/dL   Nitrite NEGATIVE NEGATIVE   Leukocytes,Ua NEGATIVE NEGATIVE   RBC / HPF 0-5 0 - 5 RBC/hpf   WBC, UA 0-5 0 - 5 WBC/hpf   Bacteria, UA NONE  SEEN NONE SEEN   Squamous Epithelial / LPF 0-5 0 - 5   Mucus PRESENT     Comment: Performed at [facility], [place] [address], [place], [place] [address]   Ct Abdomen Pelvis Wo Contrast  Result Date: 02/09/2019 CLINICAL DATA:  Fall. Found on floor. Left flank hematoma. EXAM: CT ABDOMEN AND PELVIS WITHOUT CONTRAST TECHNIQUE: Multidetector CT imaging of the abdomen and pelvis was performed following the standard protocol without IV contrast. COMPARISON:  12/04/2018 FINDINGS: Lower chest: Motion artifact through the lung bases. No lung consolidation or pleural effusion. Hepatobiliary: Subcentimeter low-density liver lesions on the prior study are partially obscured by artifact from the patient's arms today. Small gallstones are again seen. No biliary dilatation is evident. Pancreas: Unremarkable. Spleen: Unremarkable. Adrenals/Urinary Tract: Unremarkable adrenal glands. Unchanged 1.9 cm low-density right lower pole renal lesion compatible with a cyst on the prior study. No renal calculi or hydronephrosis. Apparent mild bladder wall thickening is attributed to underdistention. Stomach/Bowel: The stomach is unremarkable. A large amount of stool is present in the rectum which is distended to 8 cm in diameter. There is a moderate amount of stool in the sigmoid colon. There is no evidence of bowel obstruction. The appendix is unremarkable. Vascular/Lymphatic: Abdominal aortic atherosclerosis without aneurysm. No enlarged lymph nodes. Reproductive: Unremarkable prostate. Other: No ascites or pneumoperitoneum. No retroperitoneal hematoma. Mild stranding in the left lateral flank subcutaneous fat consistent with contusion. Musculoskeletal: No acute osseous abnormality or suspicious osseous lesion. IMPRESSION: 1. Mild left flank superficial soft tissue contusion. 2. No other evidence of acute traumatic injury in the abdomen or pelvis. 3. Large amount of stool in the rectum.  No bowel obstruction. 4. Cholelithiasis.  5.  Aortic Atherosclerosis (ICD10-I70.0). Electronically Signed   By: Logan Bores M.D.   On: 02/09/2019 17:11   Dg Chest 1 View  Result Date: 02/09/2019 CLINICAL DATA:  Found on floor today with left upper extremity bruising. EXAM: CHEST  1 VIEW COMPARISON:  None. FINDINGS: Lungs are adequately inflated and otherwise clear. Cardiomediastinal silhouette is normal. Fusion hardware intact over the cervicothoracic region. Moderately displaced slightly comminuted left humeral neck fracture. IMPRESSION: No acute cardiopulmonary disease. Moderately displaced slightly comminuted left humeral neck fracture. Electronically Signed   By: Marin Olp M.D.   On: 02/09/2019 16:14   Dg Elbow Complete Left  Result Date: 02/09/2019 CLINICAL DATA:  Found on floor with left arm pain. EXAM: LEFT ELBOW - COMPLETE 3+ VIEW COMPARISON:  None. FINDINGS: There is no evidence of fracture, dislocation, or joint effusion. There is no evidence of arthropathy or other focal bone abnormality. Soft tissues are unremarkable. IMPRESSION: Negative. Electronically Signed   By: Marin Olp M.D.   On: 02/09/2019 16:14   Ct Head Wo Contrast  Result Date: 02/09/2019 CLINICAL DATA:  Found on floor with multiple bruises. EXAM: CT HEAD WITHOUT CONTRAST CT CERVICAL SPINE WITHOUT CONTRAST TECHNIQUE: Multidetector CT imaging of the head and cervical spine was performed following the standard protocol without intravenous contrast. Multiplanar CT image reconstructions of the cervical spine were also generated. COMPARISON:  Head and cervical spine CT 05/18/2017. Brain MRI 05/04/2015. FINDINGS: CT HEAD FINDINGS Brain: There is edema anteriorly and medially in the right temporal lobe including diffuse hippocampal edema with regional sulcal  effacement. There is no midline shift. Trace hyperdense blood products are present in the occipital horns of both lateral ventricles without convincing subarachnoid hemorrhage seen elsewhere. No extra-axial fluid  collection is identified. There is moderate cerebellar atrophy. Vascular: Calcified atherosclerosis at the skull base. No hyperdense vessel. Skull: No fracture or focal osseous lesion. Sinuses/Orbits: Clear paranasal sinuses. Chronic appearing left mastoid effusion. Bilateral cataract extraction. Other: Minimal frontal scalp swelling. CT CERVICAL SPINE FINDINGS Alignment: Normal. Skull base and vertebrae: Prominent median C1-2 arthropathy. Solid C4-C6 ACDF. Solid instrumented posterior fusion C2-T2. Posterior decompression C3-C7. Chronic extension of the left C2 screw into the lateral aspect of the spinal canal. No acute fracture or destructive osseous process. Soft tissues and spinal canal: No prevertebral fluid or swelling. No visible canal hematoma. Disc levels: Posterior decompression and fusion changes as above. Mild-to-moderate osseous neural foraminal stenosis on the right at C5-6. Upper chest: Clear lung apices. Other: None. IMPRESSION: 1. Edema throughout the mesial right temporal lobe. Considerations include acute to subacute infarct, infection, and tumor. This is more extensive than is typically seen with traumatic temporal lobe contusion. Brain MRI (preferably without and with contrast) is recommended for further evaluation. 2. Trace hemorrhage in both lateral ventricles. 3. No acute cervical spine fracture or subluxation. Critical Value/emergent results were called by telephone at the time of interpretation on 02/09/2019 at 4:15 pm to Dr. Duffy Bruce, who verbally acknowledged these results. Electronically Signed   By: Logan Bores M.D.   On: 02/09/2019 16:20   Ct Cervical Spine Wo Contrast  Result Date: 02/09/2019 CLINICAL DATA:  Found on floor with multiple bruises. EXAM: CT HEAD WITHOUT CONTRAST CT CERVICAL SPINE WITHOUT CONTRAST TECHNIQUE: Multidetector CT imaging of the head and cervical spine was performed following the standard protocol without intravenous contrast. Multiplanar CT image  reconstructions of the cervical spine were also generated. COMPARISON:  Head and cervical spine CT 05/18/2017. Brain MRI 05/04/2015. FINDINGS: CT HEAD FINDINGS Brain: There is edema anteriorly and medially in the right temporal lobe including diffuse hippocampal edema with regional sulcal effacement. There is no midline shift. Trace hyperdense blood products are present in the occipital horns of both lateral ventricles without convincing subarachnoid hemorrhage seen elsewhere. No extra-axial fluid collection is identified. There is moderate cerebellar atrophy. Vascular: Calcified atherosclerosis at the skull base. No hyperdense vessel. Skull: No fracture or focal osseous lesion. Sinuses/Orbits: Clear paranasal sinuses. Chronic appearing left mastoid effusion. Bilateral cataract extraction. Other: Minimal frontal scalp swelling. CT CERVICAL SPINE FINDINGS Alignment: Normal. Skull base and vertebrae: Prominent median C1-2 arthropathy. Solid C4-C6 ACDF. Solid instrumented posterior fusion C2-T2. Posterior decompression C3-C7. Chronic extension of the left C2 screw into the lateral aspect of the spinal canal. No acute fracture or destructive osseous process. Soft tissues and spinal canal: No prevertebral fluid or swelling. No visible canal hematoma. Disc levels: Posterior decompression and fusion changes as above. Mild-to-moderate osseous neural foraminal stenosis on the right at C5-6. Upper chest: Clear lung apices. Other: None. IMPRESSION: 1. Edema throughout the mesial right temporal lobe. Considerations include acute to subacute infarct, infection, and tumor. This is more extensive than is typically seen with traumatic temporal lobe contusion. Brain MRI (preferably without and with contrast) is recommended for further evaluation. 2. Trace hemorrhage in both lateral ventricles. 3. No acute cervical spine fracture or subluxation. Critical Value/emergent results were called by telephone at the time of interpretation on  02/09/2019 at 4:15 pm to Dr. Duffy Bruce, who verbally acknowledged these results. Electronically Signed   By:  Logan Bores M.D.   On: 02/09/2019 16:20   Mr Brain W And Wo Contrast  Result Date: 02/09/2019 CLINICAL DATA:  Recent fall.  Possible intracranial hemorrhage. EXAM: MRI HEAD WITHOUT AND WITH CONTRAST TECHNIQUE: Multiplanar, multiecho pulse sequences of the brain and surrounding structures were obtained without and with intravenous contrast. CONTRAST:  7 mL Gadavist COMPARISON:  Head CT 02/09/2019 FINDINGS: BRAIN: There is abnormal diffusion restriction within the mesial right temporal lobe. There is a small amount of blood dependently within both lateral ventricles. The midline structures are normal. Moderate medial right temporal lobe edema. Minimal white matter hyperintensity, nonspecific and commonly seen in asymptomatic patients of this age. The cerebral and cerebellar volume are age-appropriate. No hydrocephalus. Susceptibility-sensitive sequences show no chronic microhemorrhage or superficial siderosis. No abnormal contrast enhancement. VASCULAR: The major intracranial arterial and venous sinus flow voids are normal. SKULL AND UPPER CERVICAL SPINE: Calvarial bone marrow signal is normal. There is no skull base mass. Visualized upper cervical spine and soft tissues are normal. SINUSES/ORBITS: No fluid levels or advanced mucosal thickening. No mastoid or middle ear effusion. The orbits are normal. IMPRESSION: 1. Abnormal diffusion restriction within the mesial right temporal lobe, most consistent with acute or early subacute infarct. This distribution may also be seen in infection, particularly herpes encephalitis, though this seems less likely in this presentation, particularly if the patient is afebrile. 2. Trace intraventricular blood products within the occipital horns of both lateral ventricles. Electronically Signed   By: Ulyses Jarred M.D.   On: 02/09/2019 20:34   Dg Humerus  Left  Result Date: 02/09/2019 CLINICAL DATA:  Found on floor with left arm pain. EXAM: LEFT HUMERUS - 2+ VIEW COMPARISON:  None. FINDINGS: Exam demonstrates a moderately displaced left humeral neck fracture with mild comminution and extending into the humeral head with mild comminution of the humeral head. There is a fracture fragment adjacent the medial aspect of the humeral neck likely from the humeral fracture, although cannot completely exclude an adjacent glenoid fracture. Remainder the exam is unremarkable. IMPRESSION: Moderately displaced mildly comminuted left humeral neck fracture with extension into the humeral head with mild comminution of the humeral head. It would be difficult to exclude an adjacent glenoid fracture. Electronically Signed   By: Marin Olp M.D.   On: 02/09/2019 16:18    Pending Labs Unresulted Labs (From admission, onward)    Start     Ordered   02/09/19 2058  CSF cell count with differential collection tube #: 1  (CSF Labs)  Once,   R    Question:  collection tube #  Answer:  1   02/09/19 2058   02/09/19 2058  CSF cell count with differential collection tube #: 4  ([facility] Labs)  Once,   R    Question:  collection tube #  Answer:  4   02/09/19 2058   02/09/19 2058  CSF culture  ([facility] Labs)  Once,   R    Question Answer Comment  Are there also cytology or pathology orders on this specimen? No   Patient immune status Normal      02/09/19 2058   02/09/19 2058  Gram stain  ([facility] Labs)  ONCE - STAT,   R    Question:  Patient immune status  Answer:  Normal   02/09/19 2058   02/09/19 2058  Protein and glucose, CSF  ([facility] Labs)  Once,   R     02/09/19 2058   02/09/19 2058  Culture, fungus  without smear  ([facility] Labs)  Once,   R    Question:  Patient immune status  Answer:  Normal   02/09/19 2058   02/09/19 2058  Anaerobic culture  ([facility] Labs)  Once,   R    Question:  Patient immune status  Answer:  Normal   02/09/19 2058   02/09/19 1613  Blood culture (routine x 2)  BLOOD  CULTURE X 2,   STAT     02/09/19 1612   Signed and Held  HIV antibody (Routine Testing)  Tomorrow morning,   R     Signed and Held   Signed and Held  TSH  Tomorrow morning,   R     Signed and Held   Signed and Held  Basic metabolic panel  Tomorrow morning,   R     Signed and Held   Signed and Held  CBC  Tomorrow morning,   R     Signed and Held   Signed and Held  CK  Tomorrow morning,   R     Signed and Held          Vitals/Pain Today's Vitals   02/09/19 1830 02/09/19 1900 02/09/19 2100 02/09/19 2245  BP: 132/68 (!) 144/84    Pulse: (!) 117 (!) 120  (!) 115  Resp: 20 17  20   Temp:      TempSrc:      SpO2: 100% 100%  100%  Weight:   65 kg     Isolation Precautions No active isolations  Medications Medications  sodium chloride 0.9 % bolus 500 mL (has no administration in time range)  0.9 %  sodium chloride infusion (has no administration in time range)  vancomycin (VANCOCIN) 500 mg in sodium chloride 0.9 % 100 mL IVPB (has no administration in time range)  ceFEPIme (MAXIPIME) 2 g in sodium chloride 0.9 % 100 mL IVPB (has no administration in time range)  lidocaine (XYLOCAINE) 2 % (with pres) injection 200 mg (has no administration in time range)  acyclovir (ZOVIRAX) 650 mg in dextrose 5 % 100 mL IVPB (has no administration in time range)  ampicillin (OMNIPEN) 2 g in sodium chloride 0.9 % 100 mL IVPB (has no administration in time range)  sodium chloride 0.9 % bolus 2,000 mL (2,000 mLs Intravenous New Bag/Given 02/09/19 1906)  ceFEPIme (MAXIPIME) 2 g in sodium chloride 0.9 % 100 mL IVPB (0 g Intravenous Stopped 02/09/19 1836)  metroNIDAZOLE (FLAGYL) IVPB 500 mg (0 mg Intravenous Stopped 02/09/19 1843)  vancomycin (VANCOCIN) IVPB 1000 mg/200 mL premix (0 mg Intravenous Stopped 02/09/19 1845)  LORazepam (ATIVAN) injection 0.5 mg (0.5 mg Intravenous Given 02/09/19 1713)  gadobutrol (GADAVIST) 1 MMOL/ML injection 7 mL (7 mLs Intravenous Contrast Given 02/09/19 2020)     Mobility walks High fall risk   Focused Assessments Neuro Assessment Handoff:   Cardiac Rhythm: Sinus tachycardia       Neuro Assessment:   Neuro Checks:      Last Documented NIHSS Modified Score:   Has TPA been given? No If patient is a Neuro Trauma and patient is going to OR before floor call report to [place] nurse: [phone] or [phone]     R Recommendations: See Admitting Provider Note  Report given to:   Additional Notes: LP Complete

## 2019-02-09 NOTE — ED Notes (Addendum)
Pt taken to CT.

## 2019-02-09 NOTE — Progress Notes (Signed)
FPTS Progress Note Came to evaluate Mr. Coots after brief signout from Dr. Ellender Hose to evaluate stability of patient's condition.  Please refer to complete H&P for more details.  Patient is presenting status post unwitnessed traumatic fall approximately 3 days ago with subsequent left humeral neck fracture.  Ortho is aware.  CT abdomen and pelvis reveals left flank superficial soft tissue contusion, otherwise negative.  CT head and neck concerning for trace hemorrhage in lateral ventricles.  Neurology aware and patient will receive urgent MRI brain.  S: Patient unable to provide history given chronic aphasia.  O: BP 132/68   Pulse (!) 117   Temp 99.5 F (37.5 C) (Rectal)   Resp 20   SpO2 100%   General: chronically ill appearing male lying in bed, non-verbal but moans during exam, NAD with non-toxic appearance HEENT: dry mucous membranes, PERRLA, EOMI Cardiovascular: regular rate and rhythm without murmurs, rubs, or gallops Lungs: clear to auscultation bilaterally with normal work of breathing Abdomen: soft, non-tender, non-distended, normoactive bowel sounds Skin: warm, dry, multiple small ulcerations on all extremities, ecchymoses to left upper extremity Extremities: warm and well perfused, normal tone, no edema Neuro: alert to me walking in room, strength equal and able to move toes  A/P: Currently receiving IV fluid bolus. We will await MRI brain and follow neurology's recommendations.  Orthopedics to follow-up left humeral neck fracture.  Sign-out has been provided to the night FPTS team who will make complete H&P.    [place] Bing, DO 02/09/2019, 6:59 PM PGY-3, [employer] [facility] pager: [phone]

## 2019-02-09 NOTE — ED Notes (Signed)
Speech is not understandable  He has multip[le bruaises and abrasions covering his body  dired dark brosn stool allaround his rectum cleaned one hour ago  Pt cathed then condom cath placed  Pt sleepoing  He has stopped pulling at his wires monemtarily

## 2019-02-09 NOTE — H&P (Signed)
[facility] Admission History and Physical Service Pager: [phone]  Patient name: Patrick Daniel Medical record number: [id number] Date of birth: [birth year] Age: 63 y.o. Gender: male  Primary Care Provider: Marco Collie, MD Consultants: neurology, orthopedic surgery Code Status: full code  Preferred Emergency Contact: sister Robbie Louis  Chief Complaint: AMS  Assessment and Plan: Patrick Daniel is a 63 y.o. male presenting with AMS after being found down. PMH is significant for cervical dystonia, dysarthria, HLD, gait abnormality (?CNS degenerative disorder?)  AMS s/p fall- found down at independent living facility, last seen normal 3 days ago. Patient unable to provide insight to what caused fall given chronic speech issues that have acutely worsened, speech is unintelligible. Known mobility issues at baseline with frequent falls. Concern for traumatic head injury after fall vs stroke vs seizure. Head CT with temporal lobe edema. MRI with concern for acute or early subacute infarct vs infection like HSV. LP was obtained in ED. Other causes for infection including UTI or pneumonia as UA w/ moderate blood, 80 ketones, no nitrites or leuks. CXR negative. LA initially 3.4 down to 2.0 after fluids. WBC 12.4. Afebrile. HR 120. BP normal range.  -admit to med-tele, attending Dr. Ardelia Mems -vitals per floor -frequent neuro checks -neurology following, appreciate input and guidance  -continue cefepime, vanc, acyclovir -follow up LP studies  -follow blood and urine cx -PT/OT/SLP consults -am CBC, BMP, TSH -up with assistance  Rhabdomyolysis  Dehydration- unknown down time, CK 1751, Cr 1.38. patient appears dehydrated on exam. Received 2.5L total NS bolus in ED. -IVMF at 75 cc/hr -am BMP and CK -I/O   L humeral neck fracture- mildly comminuted fx of L humeral neck noticed w/ significant bruising to LUE secondary to fall at home -ortho following, to see in AM-  recommending sling for now  Dysarthria- chronic, worsened- from neurology notes this has been progressive since 2009, there is suspicion for a neurodegenerative disorder from reviewing outpatient neuro notes. This may be worsened in setting of head trauma vs infection vs new stroke as above -SLP c/s -neuro following  Cervical dystonia- chronic, stable- patient follows at [facility] for botox injections, last injection 12/01/18  FEN/GI: NPO due to AMS Prophylaxis: SCDs  Disposition: admit to inpatient  History of Present Illness:  Patrick Daniel is a 63 y.o. male presenting after being found down at independent living facility. He is unable to provide information to me as his speech is unintelligible.   Spoke with sister Robbie Louis who states he has had speech issues for quite some time which she states is secondary to his cervical dystonia. She can normally understand him, maybe misses a word per sentence. She will take him grocery shopping weekly and accompanies to him to doctor appointments. She reports today she was unable to understand any of his speech. He has complained of pain in his side to her for the past several weeks. She is unsure which side has hurt him.   She reports gait issues for the past year with some falls. He has been adamant about remaining independent. She reports she has been worried about injury from fall for some time.   Review Of Systems: Per HPI with the following additions:  Review of Systems  Unable to perform ROS: Language    Patient Active Problem List   Diagnosis Date Noted  . Altered mental status 02/09/2019  . Paresthesia 08/07/2016  . Chronic migraine w/o aura w/o status migrainosus, not intractable 06/03/2016  .  H/O arthrodesis 05/17/2014  . Paraplegia ([place])   . Gait disturbance   . Dysarthria   . Cervical dystonia   . Myelopathy [facility])     Past Medical History: Past Medical History:  Diagnosis Date  . Dysarthria   .  Dystonia   . Gait disturbance   . Myelopathy ([place])   . Paraplegia [facility])     Past Surgical History: Past Surgical History:  Procedure Laterality Date  . NECK SURGERY     dec 2014    Social History: Social History   Tobacco Use  . Smoking status: Former Smoker    Types: Cigarettes    Last attempt to quit: 02/01/2013    Years since quitting: 6.0  . Smokeless tobacco: Never Used  . Tobacco comment: quit   Substance Use Topics  . Alcohol use: No  . Drug use: No   Additional social history: resides at assisted living facility  Please also refer to relevant sections of EMR.  Family History: Family History  Problem Relation Age of Onset  . Heart disease Mother   . Cancer Mother   . Heart Problems Father   . High Cholesterol Sister   . High blood pressure Sister   . Diabetes Sister   . High blood pressure Sister    Allergies and Medications: Allergies  Allergen Reactions  . Baclofen     Weakness, drowsiness   Current Facility-Administered Medications on File Prior to Encounter  Medication Dose Route Frequency Provider Last Rate Last Dose  . incobotulinumtoxinA (XEOMIN) 100 units injection 200 Units  200 Units Intramuscular Q90 days Marcial Pacas, MD   200 Units at 12/01/18 1034   Current Outpatient Medications on File Prior to Encounter  Medication Sig Dispense Refill  . aspirin 325 MG tablet Take by mouth.    . clonazePAM (KLONOPIN) 0.5 MG tablet TAKE 1 TO 2 TABLETS BY MOUTH AT NIGHT AS NEEDED 50 tablet 3  . doxycycline (PERIOSTAT) 20 MG tablet Take 20 mg by mouth daily. Taking for 60 days for a bone loss.    . DULoxetine (CYMBALTA) 60 MG capsule TAKE 1 CAPSULE(60 MG) BY MOUTH DAILY 30 capsule 11  . incobotulinumtoxinA (XEOMIN) 100 units SOLR injection Inject 200 Units into the muscle every 3 (three) months.    . loratadine-pseudoephedrine (CLARITIN-D 24-HOUR) 10-240 MG 24 hr tablet Take 1 tablet by mouth daily.    . mometasone (ELOCON) 0.1 % cream Apply 1 application  topically daily as needed.     . Multiple Vitamins tablet Take 1 tablet by mouth.    . traMADol (ULTRAM) 50 MG tablet TAKE 1 TABLET BY MOUTH THREE TIMES DAILY AS NEEDED 90 tablet 0    Objective: BP (!) 144/84   Pulse (!) 115   Temp 99.5 F (37.5 C) (Rectal)   Resp 20   Wt 65 kg   SpO2 100%   BMI 23.13 kg/m  Exam: General: thin man laying in bed in NAD Eyes: PERRL ENTM: dry lips and dry mouth Neck: supple, no LAD Cardiovascular: tachycardic, regular rhythm, no MRG Respiratory: CTAB. Normal work of breathing. Gastrointestinal: soft, +BS MSK: bruising over left shoulder, both knees. Moves all extremities  Derm: small abrasions over extremities  Neuro: follows commands, no facial drooping. Speech is garbled. Psych: unable to assess  Labs and Imaging: CBC BMET  Recent Labs  Lab 02/09/19 1529  WBC 12.4*  HGB 9.8*  HCT 29.0*  PLT 444*   Recent Labs  Lab 02/09/19 1529  NA 138  K 4.6  CL 97*  CO2 21*  BUN 81*  CREATININE 1.38*  GLUCOSE 143*  CALCIUM 9.4     Ct Abdomen Pelvis Wo Contrast  Result Date: 02/09/2019 CLINICAL DATA:  Fall. Found on floor. Left flank hematoma. EXAM: CT ABDOMEN AND PELVIS WITHOUT CONTRAST TECHNIQUE: Multidetector CT imaging of the abdomen and pelvis was performed following the standard protocol without IV contrast. COMPARISON:  12/04/2018 FINDINGS: Lower chest: Motion artifact through the lung bases. No lung consolidation or pleural effusion. Hepatobiliary: Subcentimeter low-density liver lesions on the prior study are partially obscured by artifact from the patient's arms today. Small gallstones are again seen. No biliary dilatation is evident. Pancreas: Unremarkable. Spleen: Unremarkable. Adrenals/Urinary Tract: Unremarkable adrenal glands. Unchanged 1.9 cm low-density right lower pole renal lesion compatible with a cyst on the prior study. No renal calculi or hydronephrosis. Apparent mild bladder wall thickening is attributed to underdistention.  Stomach/Bowel: The stomach is unremarkable. A large amount of stool is present in the rectum which is distended to 8 cm in diameter. There is a moderate amount of stool in the sigmoid colon. There is no evidence of bowel obstruction. The appendix is unremarkable. Vascular/Lymphatic: Abdominal aortic atherosclerosis without aneurysm. No enlarged lymph nodes. Reproductive: Unremarkable prostate. Other: No ascites or pneumoperitoneum. No retroperitoneal hematoma. Mild stranding in the left lateral flank subcutaneous fat consistent with contusion. Musculoskeletal: No acute osseous abnormality or suspicious osseous lesion. IMPRESSION: 1. Mild left flank superficial soft tissue contusion. 2. No other evidence of acute traumatic injury in the abdomen or pelvis. 3. Large amount of stool in the rectum.  No bowel obstruction. 4. Cholelithiasis. 5.  Aortic Atherosclerosis (ICD10-I70.0). Electronically Signed   By: Logan Bores M.D.   On: 02/09/2019 17:11   Dg Chest 1 View  Result Date: 02/09/2019 CLINICAL DATA:  Found on floor today with left upper extremity bruising. EXAM: CHEST  1 VIEW COMPARISON:  None. FINDINGS: Lungs are adequately inflated and otherwise clear. Cardiomediastinal silhouette is normal. Fusion hardware intact over the cervicothoracic region. Moderately displaced slightly comminuted left humeral neck fracture. IMPRESSION: No acute cardiopulmonary disease. Moderately displaced slightly comminuted left humeral neck fracture. Electronically Signed   By: Marin Olp M.D.   On: 02/09/2019 16:14   Dg Elbow Complete Left  Result Date: 02/09/2019 CLINICAL DATA:  Found on floor with left arm pain. EXAM: LEFT ELBOW - COMPLETE 3+ VIEW COMPARISON:  None. FINDINGS: There is no evidence of fracture, dislocation, or joint effusion. There is no evidence of arthropathy or other focal bone abnormality. Soft tissues are unremarkable. IMPRESSION: Negative. Electronically Signed   By: Marin Olp M.D.   On: 02/09/2019  16:14   Ct Head Wo Contrast  Result Date: 02/09/2019 CLINICAL DATA:  Found on floor with multiple bruises. EXAM: CT HEAD WITHOUT CONTRAST CT CERVICAL SPINE WITHOUT CONTRAST TECHNIQUE: Multidetector CT imaging of the head and cervical spine was performed following the standard protocol without intravenous contrast. Multiplanar CT image reconstructions of the cervical spine were also generated. COMPARISON:  Head and cervical spine CT 05/18/2017. Brain MRI 05/04/2015. FINDINGS: CT HEAD FINDINGS Brain: There is edema anteriorly and medially in the right temporal lobe including diffuse hippocampal edema with regional sulcal effacement. There is no midline shift. Trace hyperdense blood products are present in the occipital horns of both lateral ventricles without convincing subarachnoid hemorrhage seen elsewhere. No extra-axial fluid collection is identified. There is moderate cerebellar atrophy. Vascular: Calcified atherosclerosis at the skull base. No hyperdense vessel. Skull: No fracture or focal  osseous lesion. Sinuses/Orbits: Clear paranasal sinuses. Chronic appearing left mastoid effusion. Bilateral cataract extraction. Other: Minimal frontal scalp swelling. CT CERVICAL SPINE FINDINGS Alignment: Normal. Skull base and vertebrae: Prominent median C1-2 arthropathy. Solid C4-C6 ACDF. Solid instrumented posterior fusion C2-T2. Posterior decompression C3-C7. Chronic extension of the left C2 screw into the lateral aspect of the spinal canal. No acute fracture or destructive osseous process. Soft tissues and spinal canal: No prevertebral fluid or swelling. No visible canal hematoma. Disc levels: Posterior decompression and fusion changes as above. Mild-to-moderate osseous neural foraminal stenosis on the right at C5-6. Upper chest: Clear lung apices. Other: None. IMPRESSION: 1. Edema throughout the mesial right temporal lobe. Considerations include acute to subacute infarct, infection, and tumor. This is more extensive  than is typically seen with traumatic temporal lobe contusion. Brain MRI (preferably without and with contrast) is recommended for further evaluation. 2. Trace hemorrhage in both lateral ventricles. 3. No acute cervical spine fracture or subluxation. Critical Value/emergent results were called by telephone at the time of interpretation on 02/09/2019 at 4:15 pm to Dr. Duffy Bruce, who verbally acknowledged these results. Electronically Signed   By: Logan Bores M.D.   On: 02/09/2019 16:20   Ct Cervical Spine Wo Contrast  Result Date: 02/09/2019 CLINICAL DATA:  Found on floor with multiple bruises. EXAM: CT HEAD WITHOUT CONTRAST CT CERVICAL SPINE WITHOUT CONTRAST TECHNIQUE: Multidetector CT imaging of the head and cervical spine was performed following the standard protocol without intravenous contrast. Multiplanar CT image reconstructions of the cervical spine were also generated. COMPARISON:  Head and cervical spine CT 05/18/2017. Brain MRI 05/04/2015. FINDINGS: CT HEAD FINDINGS Brain: There is edema anteriorly and medially in the right temporal lobe including diffuse hippocampal edema with regional sulcal effacement. There is no midline shift. Trace hyperdense blood products are present in the occipital horns of both lateral ventricles without convincing subarachnoid hemorrhage seen elsewhere. No extra-axial fluid collection is identified. There is moderate cerebellar atrophy. Vascular: Calcified atherosclerosis at the skull base. No hyperdense vessel. Skull: No fracture or focal osseous lesion. Sinuses/Orbits: Clear paranasal sinuses. Chronic appearing left mastoid effusion. Bilateral cataract extraction. Other: Minimal frontal scalp swelling. CT CERVICAL SPINE FINDINGS Alignment: Normal. Skull base and vertebrae: Prominent median C1-2 arthropathy. Solid C4-C6 ACDF. Solid instrumented posterior fusion C2-T2. Posterior decompression C3-C7. Chronic extension of the left C2 screw into the lateral aspect of the  spinal canal. No acute fracture or destructive osseous process. Soft tissues and spinal canal: No prevertebral fluid or swelling. No visible canal hematoma. Disc levels: Posterior decompression and fusion changes as above. Mild-to-moderate osseous neural foraminal stenosis on the right at C5-6. Upper chest: Clear lung apices. Other: None. IMPRESSION: 1. Edema throughout the mesial right temporal lobe. Considerations include acute to subacute infarct, infection, and tumor. This is more extensive than is typically seen with traumatic temporal lobe contusion. Brain MRI (preferably without and with contrast) is recommended for further evaluation. 2. Trace hemorrhage in both lateral ventricles. 3. No acute cervical spine fracture or subluxation. Critical Value/emergent results were called by telephone at the time of interpretation on 02/09/2019 at 4:15 pm to Dr. Duffy Bruce, who verbally acknowledged these results. Electronically Signed   By: Logan Bores M.D.   On: 02/09/2019 16:20   Mr Brain W And Wo Contrast  Result Date: 02/09/2019 CLINICAL DATA:  Recent fall.  Possible intracranial hemorrhage. EXAM: MRI HEAD WITHOUT AND WITH CONTRAST TECHNIQUE: Multiplanar, multiecho pulse sequences of the brain and surrounding structures were obtained without and with  intravenous contrast. CONTRAST:  7 mL Gadavist COMPARISON:  Head CT 02/09/2019 FINDINGS: BRAIN: There is abnormal diffusion restriction within the mesial right temporal lobe. There is a small amount of blood dependently within both lateral ventricles. The midline structures are normal. Moderate medial right temporal lobe edema. Minimal white matter hyperintensity, nonspecific and commonly seen in asymptomatic patients of this age. The cerebral and cerebellar volume are age-appropriate. No hydrocephalus. Susceptibility-sensitive sequences show no chronic microhemorrhage or superficial siderosis. No abnormal contrast enhancement. VASCULAR: The major intracranial  arterial and venous sinus flow voids are normal. SKULL AND UPPER CERVICAL SPINE: Calvarial bone marrow signal is normal. There is no skull base mass. Visualized upper cervical spine and soft tissues are normal. SINUSES/ORBITS: No fluid levels or advanced mucosal thickening. No mastoid or middle ear effusion. The orbits are normal. IMPRESSION: 1. Abnormal diffusion restriction within the mesial right temporal lobe, most consistent with acute or early subacute infarct. This distribution may also be seen in infection, particularly herpes encephalitis, though this seems less likely in this presentation, particularly if the patient is afebrile. 2. Trace intraventricular blood products within the occipital horns of both lateral ventricles. Electronically Signed   By: Ulyses Jarred M.D.   On: 02/09/2019 20:34   Dg Humerus Left  Result Date: 02/09/2019 CLINICAL DATA:  Found on floor with left arm pain. EXAM: LEFT HUMERUS - 2+ VIEW COMPARISON:  None. FINDINGS: Exam demonstrates a moderately displaced left humeral neck fracture with mild comminution and extending into the humeral head with mild comminution of the humeral head. There is a fracture fragment adjacent the medial aspect of the humeral neck likely from the humeral fracture, although cannot completely exclude an adjacent glenoid fracture. Remainder the exam is unremarkable. IMPRESSION: Moderately displaced mildly comminuted left humeral neck fracture with extension into the humeral head with mild comminution of the humeral head. It would be difficult to exclude an adjacent glenoid fracture. Electronically Signed   By: Marin Olp M.D.   On: 02/09/2019 16:18    Steve Rattler, DO 02/09/2019, 10:48 PM PGY-3, [place] [facility] pager: [phone], text pages welcome

## 2019-02-09 NOTE — ED Notes (Signed)
Repeat lactic 2.0

## 2019-02-09 NOTE — ED Notes (Signed)
MD Isaacs at bedside performing LP.

## 2019-02-09 NOTE — Consult Note (Signed)
NEURO HOSPITALIST CONSULT NOTE   Requestig physician: Dr. Ellender Hose  Reason for Consult: Right temporal lobe lesion on CT  History obtained from:  Chart     HPI:                                                                                                                                          Patrick Daniel is an 63 y.o. 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 Speech was more slurred than usual, though he has a history of intermittent worsening of this in the context of his cervical dystonia. He had no recollection of what happened. He was estimated to have been on the floor since Monday night. He was noted to be confused with superficial bruising when found. He was found because a worker at his independent living facility noticed that his packages were stacking up.   On arrival to the ED, CBG was 179. He had multiple bruises on his body and his left arm was swollen. A severely displaced proximal left humerus fracture was seen on imaging and orthopedics was consulted. CT abdomen and pelvis revealed a left flank superficial soft tissue contusion. Overall impression of ED team and Family Medicine was that he most likely had an unwitnessed traumatic fall about 3 days ago.   Although he was afebrile, he was tachycardic and tachypneic with leukocytosis and elevated lactate of 3.4. He was diagnosed with sepsis in the ED and started on vancomycin and cefepime.   Labs also revealed rhabdomyolysis with CK of 1751. Na, K and Ca were normal. BUN/Cr abnormal at 81/1.38. AST and ALT mildly elevated. Urinalysis was unremarkable.  CT Head and C-spine: 1. Edema throughout the mesial right temporal lobe. Considerations include acute to subacute infarct, infection, and tumor. This is more extensive than is typically seen with traumatic temporal lobe contusion. Brain  MRI (preferably without and with contrast) is recommended for further evaluation. 2. Trace hemorrhage in both lateral ventricles. 3. No acute cervical spine fracture or subluxation.  MRI Brain: 1. Abnormal diffusion restriction within the mesial right temporal lobe, most consistent with acute or early subacute infarct. This distribution may also be seen in infection, particularly herpes encephalitis, though this seems less likely in this presentation, particularly if the patient is afebrile. 2. Trace intraventricular blood products within the occipital horns of both lateral ventricles.  He has baseline mobility issues and resides in independent living.   Past Medical History:  Diagnosis Date  . Dysarthria   . Dystonia   . Gait disturbance   . Myelopathy ([place])   . Paraplegia [facility])     Past Surgical History:  Procedure Laterality Date  .  NECK SURGERY     dec 2014    Family History  Problem Relation Age of Onset  . Heart disease Mother   . Cancer Mother   . Heart Problems Father   . High Cholesterol Sister   . High blood pressure Sister   . Diabetes Sister   . High blood pressure Sister               Social History:  reports that he quit smoking about 6 years ago. His smoking use included cigarettes. He has never used smokeless tobacco. He reports that he does not drink alcohol or use drugs.  Allergies  Allergen Reactions  . Baclofen     Weakness, drowsiness    MEDICATIONS:                                                                                                                     Prior to Admission:  Facility-Administered Medications Prior to Admission  Medication Dose Route Frequency Provider Last Rate Last Dose  . incobotulinumtoxinA (XEOMIN) 100 units injection 200 Units  200 Units Intramuscular Q90 days Marcial Pacas, MD   200 Units at 12/01/18 1034   Medications Prior to Admission  Medication Sig Dispense Refill Last Dose  . aspirin 325 MG tablet Take  by mouth.   Taking  . clonazePAM (KLONOPIN) 0.5 MG tablet TAKE 1 TO 2 TABLETS BY MOUTH AT NIGHT AS NEEDED 50 tablet 3   . doxycycline (PERIOSTAT) 20 MG tablet Take 20 mg by mouth daily. Taking for 60 days for a bone loss.   Taking  . DULoxetine (CYMBALTA) 60 MG capsule TAKE 1 CAPSULE(60 MG) BY MOUTH DAILY 30 capsule 11   . incobotulinumtoxinA (XEOMIN) 100 units SOLR injection Inject 200 Units into the muscle every 3 (three) months.     . loratadine-pseudoephedrine (CLARITIN-D 24-HOUR) 10-240 MG 24 hr tablet Take 1 tablet by mouth daily.   Taking  . mometasone (ELOCON) 0.1 % cream Apply 1 application topically daily as needed.    Taking  . Multiple Vitamins tablet Take 1 tablet by mouth.   Taking  . traMADol (ULTRAM) 50 MG tablet TAKE 1 TABLET BY MOUTH THREE TIMES DAILY AS NEEDED 90 tablet 0 Taking   Scheduled: . lidocaine  10 mL Intradermal Once   Continuous: . sodium chloride 75 mL/hr at 02/10/19 0450  . acyclovir 650 mg (02/10/19 0053)  . ceFEPime (MAXIPIME) IV 2 g (02/10/19 0501)  . sodium chloride    . vancomycin 500 mg (02/10/19 0539)     ROS:  Unable to obtain due to AMS.    Blood pressure (!) 144/84, pulse (!) 120, temperature 99.5 F (37.5 C), temperature source Rectal, resp. rate 17, SpO2 100 %.   General Examination:                                                                                                       Physical Exam  HEENT-  Normocephalic. Dry oral mucosa.  Lungs- Tachypneic Extremities- Extensive bruising of left upper arm. Abrasions and bruising scattered to other extremities.  Skin- Nondiaphoretic but significantly warm to touch  Neurological Examination Mental Status: Awakens to a mildly agitated, delirious state. Follows about 30% of all simple motor commands. Speech is sparse and unintelligible.  Cranial  Nerves: II: Will briefly fixate on examiner's face. PERRL. Does not consistently blink to threat.   III,IV, VI: Roving, rapid EOM.  V,VII: Face symmetric. Reacts to touch bilaterally.  VIII: hearing intact to voice IX,X: Mild hypophonia in context of dry oral mucosa XI: Head is midline XII: midline tongue extension Motor: RUE grossly 5/5 with exam limited by patient confusion.  LUE: Moves distally with 4/5 strength. Does not elevate at shoulder due to pain (humerus fracture) BLE: Moves feet/ankles in agitated fashion but does not withdraw LEs with knee or hip flexion to noxious plantar stimulation or command Sensory: Reacts to noxious stimuli and repeated FT in all 4 extremities Deep Tendon Reflexes: Low amplitude 3+ reflexes in upper and lower extremities Plantars: Upgoing bilaterally    Cerebellar: Unable to assess Gait: Unable to assess   Lab Results: Basic Metabolic Panel: Recent Labs  Lab 02/09/19 1529  NA 138  K 4.6  CL 97*  CO2 21*  GLUCOSE 143*  BUN 81*  CREATININE 1.38*  CALCIUM 9.4    CBC: Recent Labs  Lab 02/09/19 1529  WBC 12.4*  NEUTROABS 10.2*  HGB 9.8*  HCT 29.0*  MCV 90.9  PLT 444*    Cardiac Enzymes: Recent Labs  Lab 02/09/19 1529  CKTOTAL 1,751*  TROPONINI <0.03    Lipid Panel: No results for input(s): CHOL, TRIG, HDL, CHOLHDL, VLDL, LDLCALC in the last 168 hours.  Imaging: Ct Abdomen Pelvis Wo Contrast  Result Date: 02/09/2019 CLINICAL DATA:  Fall. Found on floor. Left flank hematoma. EXAM: CT ABDOMEN AND PELVIS WITHOUT CONTRAST TECHNIQUE: Multidetector CT imaging of the abdomen and pelvis was performed following the standard protocol without IV contrast. COMPARISON:  12/04/2018 FINDINGS: Lower chest: Motion artifact through the lung bases. No lung consolidation or pleural effusion. Hepatobiliary: Subcentimeter low-density liver lesions on the prior study are partially obscured by artifact from the patient's arms today. Small gallstones  are again seen. No biliary dilatation is evident. Pancreas: Unremarkable. Spleen: Unremarkable. Adrenals/Urinary Tract: Unremarkable adrenal glands. Unchanged 1.9 cm low-density right lower pole renal lesion compatible with a cyst on the prior study. No renal calculi or hydronephrosis. Apparent mild bladder wall thickening is attributed to underdistention. Stomach/Bowel: The stomach is unremarkable. A large amount of stool is present in the rectum which is distended to 8 cm in diameter. There is a  moderate amount of stool in the sigmoid colon. There is no evidence of bowel obstruction. The appendix is unremarkable. Vascular/Lymphatic: Abdominal aortic atherosclerosis without aneurysm. No enlarged lymph nodes. Reproductive: Unremarkable prostate. Other: No ascites or pneumoperitoneum. No retroperitoneal hematoma. Mild stranding in the left lateral flank subcutaneous fat consistent with contusion. Musculoskeletal: No acute osseous abnormality or suspicious osseous lesion. IMPRESSION: 1. Mild left flank superficial soft tissue contusion. 2. No other evidence of acute traumatic injury in the abdomen or pelvis. 3. Large amount of stool in the rectum.  No bowel obstruction. 4. Cholelithiasis. 5.  Aortic Atherosclerosis (ICD10-I70.0). Electronically Signed   By: Logan Bores M.D.   On: 02/09/2019 17:11   Dg Chest 1 View  Result Date: 02/09/2019 CLINICAL DATA:  Found on floor today with left upper extremity bruising. EXAM: CHEST  1 VIEW COMPARISON:  None. FINDINGS: Lungs are adequately inflated and otherwise clear. Cardiomediastinal silhouette is normal. Fusion hardware intact over the cervicothoracic region. Moderately displaced slightly comminuted left humeral neck fracture. IMPRESSION: No acute cardiopulmonary disease. Moderately displaced slightly comminuted left humeral neck fracture. Electronically Signed   By: Marin Olp M.D.   On: 02/09/2019 16:14   Dg Elbow Complete Left  Result Date: 02/09/2019 CLINICAL  DATA:  Found on floor with left arm pain. EXAM: LEFT ELBOW - COMPLETE 3+ VIEW COMPARISON:  None. FINDINGS: There is no evidence of fracture, dislocation, or joint effusion. There is no evidence of arthropathy or other focal bone abnormality. Soft tissues are unremarkable. IMPRESSION: Negative. Electronically Signed   By: Marin Olp M.D.   On: 02/09/2019 16:14   Ct Head Wo Contrast  Result Date: 02/09/2019 CLINICAL DATA:  Found on floor with multiple bruises. EXAM: CT HEAD WITHOUT CONTRAST CT CERVICAL SPINE WITHOUT CONTRAST TECHNIQUE: Multidetector CT imaging of the head and cervical spine was performed following the standard protocol without intravenous contrast. Multiplanar CT image reconstructions of the cervical spine were also generated. COMPARISON:  Head and cervical spine CT 05/18/2017. Brain MRI 05/04/2015. FINDINGS: CT HEAD FINDINGS Brain: There is edema anteriorly and medially in the right temporal lobe including diffuse hippocampal edema with regional sulcal effacement. There is no midline shift. Trace hyperdense blood products are present in the occipital horns of both lateral ventricles without convincing subarachnoid hemorrhage seen elsewhere. No extra-axial fluid collection is identified. There is moderate cerebellar atrophy. Vascular: Calcified atherosclerosis at the skull base. No hyperdense vessel. Skull: No fracture or focal osseous lesion. Sinuses/Orbits: Clear paranasal sinuses. Chronic appearing left mastoid effusion. Bilateral cataract extraction. Other: Minimal frontal scalp swelling. CT CERVICAL SPINE FINDINGS Alignment: Normal. Skull base and vertebrae: Prominent median C1-2 arthropathy. Solid C4-C6 ACDF. Solid instrumented posterior fusion C2-T2. Posterior decompression C3-C7. Chronic extension of the left C2 screw into the lateral aspect of the spinal canal. No acute fracture or destructive osseous process. Soft tissues and spinal canal: No prevertebral fluid or swelling. No visible  canal hematoma. Disc levels: Posterior decompression and fusion changes as above. Mild-to-moderate osseous neural foraminal stenosis on the right at C5-6. Upper chest: Clear lung apices. Other: None. IMPRESSION: 1. Edema throughout the mesial right temporal lobe. Considerations include acute to subacute infarct, infection, and tumor. This is more extensive than is typically seen with traumatic temporal lobe contusion. Brain MRI (preferably without and with contrast) is recommended for further evaluation. 2. Trace hemorrhage in both lateral ventricles. 3. No acute cervical spine fracture or subluxation. Critical Value/emergent results were called by telephone at the time of interpretation on 02/09/2019 at 4:15 pm  to Dr. Duffy Bruce, who verbally acknowledged these results. Electronically Signed   By: Logan Bores M.D.   On: 02/09/2019 16:20   Ct Cervical Spine Wo Contrast  Result Date: 02/09/2019 CLINICAL DATA:  Found on floor with multiple bruises. EXAM: CT HEAD WITHOUT CONTRAST CT CERVICAL SPINE WITHOUT CONTRAST TECHNIQUE: Multidetector CT imaging of the head and cervical spine was performed following the standard protocol without intravenous contrast. Multiplanar CT image reconstructions of the cervical spine were also generated. COMPARISON:  Head and cervical spine CT 05/18/2017. Brain MRI 05/04/2015. FINDINGS: CT HEAD FINDINGS Brain: There is edema anteriorly and medially in the right temporal lobe including diffuse hippocampal edema with regional sulcal effacement. There is no midline shift. Trace hyperdense blood products are present in the occipital horns of both lateral ventricles without convincing subarachnoid hemorrhage seen elsewhere. No extra-axial fluid collection is identified. There is moderate cerebellar atrophy. Vascular: Calcified atherosclerosis at the skull base. No hyperdense vessel. Skull: No fracture or focal osseous lesion. Sinuses/Orbits: Clear paranasal sinuses. Chronic appearing  left mastoid effusion. Bilateral cataract extraction. Other: Minimal frontal scalp swelling. CT CERVICAL SPINE FINDINGS Alignment: Normal. Skull base and vertebrae: Prominent median C1-2 arthropathy. Solid C4-C6 ACDF. Solid instrumented posterior fusion C2-T2. Posterior decompression C3-C7. Chronic extension of the left C2 screw into the lateral aspect of the spinal canal. No acute fracture or destructive osseous process. Soft tissues and spinal canal: No prevertebral fluid or swelling. No visible canal hematoma. Disc levels: Posterior decompression and fusion changes as above. Mild-to-moderate osseous neural foraminal stenosis on the right at C5-6. Upper chest: Clear lung apices. Other: None. IMPRESSION: 1. Edema throughout the mesial right temporal lobe. Considerations include acute to subacute infarct, infection, and tumor. This is more extensive than is typically seen with traumatic temporal lobe contusion. Brain MRI (preferably without and with contrast) is recommended for further evaluation. 2. Trace hemorrhage in both lateral ventricles. 3. No acute cervical spine fracture or subluxation. Critical Value/emergent results were called by telephone at the time of interpretation on 02/09/2019 at 4:15 pm to Dr. Duffy Bruce, who verbally acknowledged these results. Electronically Signed   By: Logan Bores M.D.   On: 02/09/2019 16:20   Mr Brain W And Wo Contrast  Result Date: 02/09/2019 CLINICAL DATA:  Recent fall.  Possible intracranial hemorrhage. EXAM: MRI HEAD WITHOUT AND WITH CONTRAST TECHNIQUE: Multiplanar, multiecho pulse sequences of the brain and surrounding structures were obtained without and with intravenous contrast. CONTRAST:  7 mL Gadavist COMPARISON:  Head CT 02/09/2019 FINDINGS: BRAIN: There is abnormal diffusion restriction within the mesial right temporal lobe. There is a small amount of blood dependently within both lateral ventricles. The midline structures are normal. Moderate medial right  temporal lobe edema. Minimal white matter hyperintensity, nonspecific and commonly seen in asymptomatic patients of this age. The cerebral and cerebellar volume are age-appropriate. No hydrocephalus. Susceptibility-sensitive sequences show no chronic microhemorrhage or superficial siderosis. No abnormal contrast enhancement. VASCULAR: The major intracranial arterial and venous sinus flow voids are normal. SKULL AND UPPER CERVICAL SPINE: Calvarial bone marrow signal is normal. There is no skull base mass. Visualized upper cervical spine and soft tissues are normal. SINUSES/ORBITS: No fluid levels or advanced mucosal thickening. No mastoid or middle ear effusion. The orbits are normal. IMPRESSION: 1. Abnormal diffusion restriction within the mesial right temporal lobe, most consistent with acute or early subacute infarct. This distribution may also be seen in infection, particularly herpes encephalitis, though this seems less likely in this presentation, particularly if the  patient is afebrile. 2. Trace intraventricular blood products within the occipital horns of both lateral ventricles. Electronically Signed   By: Ulyses Jarred M.D.   On: 02/09/2019 20:34   Dg Humerus Left  Result Date: 02/09/2019 CLINICAL DATA:  Found on floor with left arm pain. EXAM: LEFT HUMERUS - 2+ VIEW COMPARISON:  None. FINDINGS: Exam demonstrates a moderately displaced left humeral neck fracture with mild comminution and extending into the humeral head with mild comminution of the humeral head. There is a fracture fragment adjacent the medial aspect of the humeral neck likely from the humeral fracture, although cannot completely exclude an adjacent glenoid fracture. Remainder the exam is unremarkable. IMPRESSION: Moderately displaced mildly comminuted left humeral neck fracture with extension into the humeral head with mild comminution of the humeral head. It would be difficult to exclude an adjacent glenoid fracture. Electronically  Signed   By: Marin Olp M.D.   On: 02/09/2019 16:18   CSF: Glucose 60 RBC 200 WBC 335 with 93% lymphocytes Xanthochromic Protein 470  Assessment: 63 year old male found on floor with multiple bruises and AMS. 1. CT reveals right temporal lobe hypodensity with correlating findings on MRI that appear most consistent with herpes encephalitis.  2. 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 Severely displaced proximal left humerus fracture  4. Traumatic rhabdomyolysis 5. TSH is low at 0.027. Would redraw and obtain   6. History of paraplegia in the context of prior history of myelopathy. This may explain decreased movement of lower extremities on exam.   Recommendations: 1. Agree with acyclovir. Continue for 14 - 21 days with possible decision to extend to 21 days to be based on the patient's clinical course. Hydrate well while on this medication, with recommended initial hydration route to be IV.  2. ID consult.  3. EEG in AM (ordered) 4. No indication for an anticonvulsant unless seizure occurs or EEG shows electrographic seizure. First choice for an anticonvulsant, if needed, would be Keppra given ease of administration, ease of titration and the patient's reasonably good renal function.  5. IVF for renal protection. He has two factors which could result in renal damage: rhabdomyolysis and acyclovir treatment. 1000 ml NS bolus (ordered), followed by 150 cc/hr x 24 hours (ordered), then primary team to reevaluate.  6. Orthopedics is following for left humerus fracture, which will most likely need open reduction and fixation.  7. Frequent neuro checks   Electronically signed: Dr. Kerney Elbe 02/09/2019, 8:52 PM

## 2019-02-09 NOTE — ED Provider Notes (Addendum)
[facility] EMERGENCY DEPARTMENT Provider Note   CSN: [id number] Arrival date & time: 02/09/19  1436    History   Chief Complaint Chief Complaint  Patient presents with   Weakness    HPI Patrick Daniel is a 63 y.o. male.     HPI   63 yo M with PMHx cervical dystonia, on chronic botox injections, with baseline mobility issues though in independent living, here w/ fall, confusion. Per report, pt was last seen 3 days ago. He was in his usual state of health at that time. Sister has been unable to get in touch with him since then via telephone, and a worker at his independent living noticed that his packages were stacking today so they checked on him. Found him confused, huddled in corner after apparent fall. Speech si more slurred than usual, though which his dystonia he has intermittent worsening of this as well. History limited on my assessment 2/2 dysarthria. Denies any pain. He is noted to have significant LUE and L flank bruising. He does not recall what happened.  Level 5 caveat invoked as remainder of history, ROS, and physical exam limited due to patient's confusion/dysarthria.   Past Medical History:  Diagnosis Date   Dysarthria    Dystonia    Gait disturbance    Myelopathy ([place])    Paraplegia [facility])     Patient Active Problem List   Diagnosis Date Noted   Altered mental status 02/09/2019   Paresthesia 08/07/2016   Chronic migraine w/o aura w/o status migrainosus, not intractable 06/03/2016   H/O arthrodesis 05/17/2014   Paraplegia ([place])    Gait disturbance    Dysarthria    Cervical dystonia    Myelopathy ([place])     Past Surgical History:  Procedure Laterality Date   NECK SURGERY     dec 2014        Home Medications    Prior to Admission medications   Medication Sig Start Date End Date Taking? Authorizing Provider  aspirin 325 MG tablet Take by mouth. 12/19/10   [provider]  clonazePAM (KLONOPIN) 0.5 MG  tablet TAKE 1 TO 2 TABLETS BY MOUTH AT NIGHT AS NEEDED 01/17/19   Marcial Pacas, MD  doxycycline (PERIOSTAT) 20 MG tablet Take 20 mg by mouth daily. Taking for 60 days for a bone loss.    [provider]  DULoxetine (CYMBALTA) 60 MG capsule TAKE 1 CAPSULE(60 MG) BY MOUTH DAILY 12/10/18   Marcial Pacas, MD  incobotulinumtoxinA [facility]) 100 units SOLR injection Inject 200 Units into the muscle every 3 (three) months.    [provider]  loratadine-pseudoephedrine (CLARITIN-D 24-HOUR) 10-240 MG 24 hr tablet Take 1 tablet by mouth daily.    [provider]  mometasone (ELOCON) 0.1 % cream Apply 1 application topically daily as needed.     [provider]  Multiple Vitamins tablet Take 1 tablet by mouth.    [provider]  traMADol (ULTRAM) 50 MG tablet TAKE 1 TABLET BY MOUTH THREE TIMES DAILY AS NEEDED 09/25/17   Marcial Pacas, MD    Family History Family History  Problem Relation Age of Onset   Heart disease Mother    Cancer Mother    Heart Problems Father    High Cholesterol Sister    High blood pressure Sister    Diabetes Sister    High blood pressure Sister     Social History Social History   Tobacco Use   Smoking status: Former Smoker  Types: Cigarettes    Last attempt to quit: 02/01/2013    Years since quitting: 6.0   Smokeless tobacco: Never Used   Tobacco comment: quit   Substance Use Topics   Alcohol use: No   Drug use: No     Allergies   Baclofen   Review of Systems Review of Systems  Unable to perform ROS: Mental status change     Physical Exam Updated Vital Signs BP (!) 141/80 (BP Location: Right Arm)    Pulse (!) 122    Temp 98.7 F (37.1 C) (Oral)    Resp 20    Wt 65 kg    SpO2 100%    BMI 23.13 kg/m   Physical Exam Vitals signs and nursing note reviewed.  Constitutional:      General: He is not in acute distress.    Appearance: He is well-developed.  HENT:     Head: Normocephalic.     Comments:  Markedly dry MM with thick secretions covering upper and lower teeth. Superficial, old-appearing abrasion to frontal scalp. Eyes:     Conjunctiva/sclera: Conjunctivae normal.  Neck:     Musculoskeletal: Neck supple.     Comments: Cervical collar in place Cardiovascular:     Rate and Rhythm: Normal rate and regular rhythm.     Heart sounds: Normal heart sounds. No murmur. No friction rub.  Pulmonary:     Effort: Pulmonary effort is normal. Tachypnea present. No respiratory distress.     Breath sounds: Normal breath sounds. No wheezing or rales.  Chest:     Comments: Mild bruising noted L anterior chest wall Abdominal:     General: There is no distension.     Palpations: Abdomen is soft.     Tenderness: There is no abdominal tenderness.     Comments: Bruising and hematoma to L lower flank and side  Skin:    General: Skin is warm.     Capillary Refill: Capillary refill takes less than 2 seconds.  Neurological:     Mental Status: He is alert and oriented to person, place, and time.     Motor: No abnormal muscle tone.     UPPER EXTREMITY EXAM: LEFT  INSPECTION & PALPATION: Marked edema, bruising, and tenderness to LUE extending from shoulder to elbow. No open wounds.  SENSORY: Sensation is intact to light touch in:  Superficial radial nerve distribution (dorsal first web space) Median nerve distribution (tip of index finger)   Ulnar nerve distribution (tip of small finger)     MOTOR:  + Motor posterior interosseous nerve (thumb IP extension) + Anterior interosseous nerve (thumb IP flexion, index finger DIP flexion) + Radial nerve (wrist extension) + Median nerve (palpable firing thenar mass) + Ulnar nerve (palpable firing of first dorsal interosseous muscle)  VASCULAR: 2+ radial pulse Brisk capillary refill < 2 sec, fingers warm and well-perfused   ED Treatments / Results  Labs (all labs ordered are listed, but only abnormal results are displayed) Labs Reviewed  CBC  WITH DIFFERENTIAL/PLATELET - Abnormal; Notable for the following components:      Result Value   WBC 12.4 (*)    RBC 3.19 (*)    Hemoglobin 9.8 (*)    HCT 29.0 (*)    Platelets 444 (*)    Neutro Abs 10.2 (*)    Lymphs Abs 0.6 (*)    Monocytes Absolute 1.5 (*)    All other components within normal limits  COMPREHENSIVE METABOLIC PANEL - Abnormal; Notable for the  following components:   Chloride 97 (*)    CO2 21 (*)    Glucose, Bld 143 (*)    BUN 81 (*)    Creatinine, Ser 1.38 (*)    AST 54 (*)    ALT 46 (*)    Total Bilirubin 1.9 (*)    GFR calc non Af Amer 55 (*)    Anion gap 20 (*)    All other components within normal limits  CK - Abnormal; Notable for the following components:   Total CK 1,751 (*)    All other components within normal limits  URINALYSIS, ROUTINE W REFLEX MICROSCOPIC - Abnormal; Notable for the following components:   Hgb urine dipstick MODERATE (*)    Ketones, ur 80 (*)    All other components within normal limits  LACTIC ACID, PLASMA - Abnormal; Notable for the following components:   Lactic Acid, Venous 3.4 (*)    All other components within normal limits  LACTIC ACID, PLASMA - Abnormal; Notable for the following components:   Lactic Acid, Venous 2.0 (*)    All other components within normal limits  CULTURE, BLOOD (ROUTINE X 2)  CULTURE, BLOOD (ROUTINE X 2)  CSF CULTURE  CULTURE, FUNGUS WITHOUT SMEAR  ANAEROBIC CULTURE  TROPONIN I  PROTIME-INR  CSF CELL COUNT WITH DIFFERENTIAL  CSF CELL COUNT WITH DIFFERENTIAL  PROTEIN AND GLUCOSE, CSF  CYTOLOGY - NON PAP    EKG EKG Interpretation  Date/Time:  Wednesday February 09 2019 14:46:42 EDT Ventricular Rate:  127 PR Interval:    QRS Duration: 83 QT Interval:  305 QTC Calculation: 444 R Axis:   88 Text Interpretation:  Sinus tachycardia Borderline right axis deviation Consider left ventricular hypertrophy No old tracing to compare Confirmed by Duffy Bruce [phone]) on 02/09/2019 3:11:03  PM   Radiology Ct Abdomen Pelvis Wo Contrast  Result Date: 02/09/2019 CLINICAL DATA:  Fall. Found on floor. Left flank hematoma. EXAM: CT ABDOMEN AND PELVIS WITHOUT CONTRAST TECHNIQUE: Multidetector CT imaging of the abdomen and pelvis was performed following the standard protocol without IV contrast. COMPARISON:  12/04/2018 FINDINGS: Lower chest: Motion artifact through the lung bases. No lung consolidation or pleural effusion. Hepatobiliary: Subcentimeter low-density liver lesions on the prior study are partially obscured by artifact from the patient's arms today. Small gallstones are again seen. No biliary dilatation is evident. Pancreas: Unremarkable. Spleen: Unremarkable. Adrenals/Urinary Tract: Unremarkable adrenal glands. Unchanged 1.9 cm low-density right lower pole renal lesion compatible with a cyst on the prior study. No renal calculi or hydronephrosis. Apparent mild bladder wall thickening is attributed to underdistention. Stomach/Bowel: The stomach is unremarkable. A large amount of stool is present in the rectum which is distended to 8 cm in diameter. There is a moderate amount of stool in the sigmoid colon. There is no evidence of bowel obstruction. The appendix is unremarkable. Vascular/Lymphatic: Abdominal aortic atherosclerosis without aneurysm. No enlarged lymph nodes. Reproductive: Unremarkable prostate. Other: No ascites or pneumoperitoneum. No retroperitoneal hematoma. Mild stranding in the left lateral flank subcutaneous fat consistent with contusion. Musculoskeletal: No acute osseous abnormality or suspicious osseous lesion. IMPRESSION: 1. Mild left flank superficial soft tissue contusion. 2. No other evidence of acute traumatic injury in the abdomen or pelvis. 3. Large amount of stool in the rectum.  No bowel obstruction. 4. Cholelithiasis. 5.  Aortic Atherosclerosis (ICD10-I70.0). Electronically Signed   By: Logan Bores M.D.   On: 02/09/2019 17:11   Dg Chest 1 View  Result Date:  02/09/2019 CLINICAL DATA:  Found  on floor today with left upper extremity bruising. EXAM: CHEST  1 VIEW COMPARISON:  None. FINDINGS: Lungs are adequately inflated and otherwise clear. Cardiomediastinal silhouette is normal. Fusion hardware intact over the cervicothoracic region. Moderately displaced slightly comminuted left humeral neck fracture. IMPRESSION: No acute cardiopulmonary disease. Moderately displaced slightly comminuted left humeral neck fracture. Electronically Signed   By: Marin Olp M.D.   On: 02/09/2019 16:14   Dg Elbow Complete Left  Result Date: 02/09/2019 CLINICAL DATA:  Found on floor with left arm pain. EXAM: LEFT ELBOW - COMPLETE 3+ VIEW COMPARISON:  None. FINDINGS: There is no evidence of fracture, dislocation, or joint effusion. There is no evidence of arthropathy or other focal bone abnormality. Soft tissues are unremarkable. IMPRESSION: Negative. Electronically Signed   By: Marin Olp M.D.   On: 02/09/2019 16:14   Ct Head Wo Contrast  Result Date: 02/09/2019 CLINICAL DATA:  Found on floor with multiple bruises. EXAM: CT HEAD WITHOUT CONTRAST CT CERVICAL SPINE WITHOUT CONTRAST TECHNIQUE: Multidetector CT imaging of the head and cervical spine was performed following the standard protocol without intravenous contrast. Multiplanar CT image reconstructions of the cervical spine were also generated. COMPARISON:  Head and cervical spine CT 05/18/2017. Brain MRI 05/04/2015. FINDINGS: CT HEAD FINDINGS Brain: There is edema anteriorly and medially in the right temporal lobe including diffuse hippocampal edema with regional sulcal effacement. There is no midline shift. Trace hyperdense blood products are present in the occipital horns of both lateral ventricles without convincing subarachnoid hemorrhage seen elsewhere. No extra-axial fluid collection is identified. There is moderate cerebellar atrophy. Vascular: Calcified atherosclerosis at the skull base. No hyperdense vessel. Skull:  No fracture or focal osseous lesion. Sinuses/Orbits: Clear paranasal sinuses. Chronic appearing left mastoid effusion. Bilateral cataract extraction. Other: Minimal frontal scalp swelling. CT CERVICAL SPINE FINDINGS Alignment: Normal. Skull base and vertebrae: Prominent median C1-2 arthropathy. Solid C4-C6 ACDF. Solid instrumented posterior fusion C2-T2. Posterior decompression C3-C7. Chronic extension of the left C2 screw into the lateral aspect of the spinal canal. No acute fracture or destructive osseous process. Soft tissues and spinal canal: No prevertebral fluid or swelling. No visible canal hematoma. Disc levels: Posterior decompression and fusion changes as above. Mild-to-moderate osseous neural foraminal stenosis on the right at C5-6. Upper chest: Clear lung apices. Other: None. IMPRESSION: 1. Edema throughout the mesial right temporal lobe. Considerations include acute to subacute infarct, infection, and tumor. This is more extensive than is typically seen with traumatic temporal lobe contusion. Brain MRI (preferably without and with contrast) is recommended for further evaluation. 2. Trace hemorrhage in both lateral ventricles. 3. No acute cervical spine fracture or subluxation. Critical Value/emergent results were called by telephone at the time of interpretation on 02/09/2019 at 4:15 pm to Dr. Duffy Bruce, who verbally acknowledged these results. Electronically Signed   By: Logan Bores M.D.   On: 02/09/2019 16:20   Ct Cervical Spine Wo Contrast  Result Date: 02/09/2019 CLINICAL DATA:  Found on floor with multiple bruises. EXAM: CT HEAD WITHOUT CONTRAST CT CERVICAL SPINE WITHOUT CONTRAST TECHNIQUE: Multidetector CT imaging of the head and cervical spine was performed following the standard protocol without intravenous contrast. Multiplanar CT image reconstructions of the cervical spine were also generated. COMPARISON:  Head and cervical spine CT 05/18/2017. Brain MRI 05/04/2015. FINDINGS: CT HEAD  FINDINGS Brain: There is edema anteriorly and medially in the right temporal lobe including diffuse hippocampal edema with regional sulcal effacement. There is no midline shift. Trace hyperdense blood products are present in  the occipital horns of both lateral ventricles without convincing subarachnoid hemorrhage seen elsewhere. No extra-axial fluid collection is identified. There is moderate cerebellar atrophy. Vascular: Calcified atherosclerosis at the skull base. No hyperdense vessel. Skull: No fracture or focal osseous lesion. Sinuses/Orbits: Clear paranasal sinuses. Chronic appearing left mastoid effusion. Bilateral cataract extraction. Other: Minimal frontal scalp swelling. CT CERVICAL SPINE FINDINGS Alignment: Normal. Skull base and vertebrae: Prominent median C1-2 arthropathy. Solid C4-C6 ACDF. Solid instrumented posterior fusion C2-T2. Posterior decompression C3-C7. Chronic extension of the left C2 screw into the lateral aspect of the spinal canal. No acute fracture or destructive osseous process. Soft tissues and spinal canal: No prevertebral fluid or swelling. No visible canal hematoma. Disc levels: Posterior decompression and fusion changes as above. Mild-to-moderate osseous neural foraminal stenosis on the right at C5-6. Upper chest: Clear lung apices. Other: None. IMPRESSION: 1. Edema throughout the mesial right temporal lobe. Considerations include acute to subacute infarct, infection, and tumor. This is more extensive than is typically seen with traumatic temporal lobe contusion. Brain MRI (preferably without and with contrast) is recommended for further evaluation. 2. Trace hemorrhage in both lateral ventricles. 3. No acute cervical spine fracture or subluxation. Critical Value/emergent results were called by telephone at the time of interpretation on 02/09/2019 at 4:15 pm to Dr. Duffy Bruce, who verbally acknowledged these results. Electronically Signed   By: Logan Bores M.D.   On: 02/09/2019  16:20   Mr Brain W And Wo Contrast  Result Date: 02/09/2019 CLINICAL DATA:  Recent fall.  Possible intracranial hemorrhage. EXAM: MRI HEAD WITHOUT AND WITH CONTRAST TECHNIQUE: Multiplanar, multiecho pulse sequences of the brain and surrounding structures were obtained without and with intravenous contrast. CONTRAST:  7 mL Gadavist COMPARISON:  Head CT 02/09/2019 FINDINGS: BRAIN: There is abnormal diffusion restriction within the mesial right temporal lobe. There is a small amount of blood dependently within both lateral ventricles. The midline structures are normal. Moderate medial right temporal lobe edema. Minimal white matter hyperintensity, nonspecific and commonly seen in asymptomatic patients of this age. The cerebral and cerebellar volume are age-appropriate. No hydrocephalus. Susceptibility-sensitive sequences show no chronic microhemorrhage or superficial siderosis. No abnormal contrast enhancement. VASCULAR: The major intracranial arterial and venous sinus flow voids are normal. SKULL AND UPPER CERVICAL SPINE: Calvarial bone marrow signal is normal. There is no skull base mass. Visualized upper cervical spine and soft tissues are normal. SINUSES/ORBITS: No fluid levels or advanced mucosal thickening. No mastoid or middle ear effusion. The orbits are normal. IMPRESSION: 1. Abnormal diffusion restriction within the mesial right temporal lobe, most consistent with acute or early subacute infarct. This distribution may also be seen in infection, particularly herpes encephalitis, though this seems less likely in this presentation, particularly if the patient is afebrile. 2. Trace intraventricular blood products within the occipital horns of both lateral ventricles. Electronically Signed   By: Ulyses Jarred M.D.   On: 02/09/2019 20:34   Dg Humerus Left  Result Date: 02/09/2019 CLINICAL DATA:  Found on floor with left arm pain. EXAM: LEFT HUMERUS - 2+ VIEW COMPARISON:  None. FINDINGS: Exam demonstrates a  moderately displaced left humeral neck fracture with mild comminution and extending into the humeral head with mild comminution of the humeral head. There is a fracture fragment adjacent the medial aspect of the humeral neck likely from the humeral fracture, although cannot completely exclude an adjacent glenoid fracture. Remainder the exam is unremarkable. IMPRESSION: Moderately displaced mildly comminuted left humeral neck fracture with extension into the humeral head  with mild comminution of the humeral head. It would be difficult to exclude an adjacent glenoid fracture. Electronically Signed   By: Marin Olp M.D.   On: 02/09/2019 16:18    Procedures .Lumbar Puncture Date/Time: 02/10/2019 12:15 AM Performed by: Duffy Bruce, MD Authorized by: Duffy Bruce, MD   Consent:    Consent obtained:  Written   Consent given by:  Patient   Risks discussed:  Bleeding, headache, infection, nerve damage, repeat procedure and pain   Alternatives discussed:  Alternative treatment, observation and referral Pre-procedure details:    Procedure purpose:  Diagnostic   Preparation: Patient was prepped and draped in usual sterile fashion   Anesthesia (see MAR for exact dosages):    Anesthesia method:  Local infiltration   Local anesthetic:  Lidocaine 1% w/o epi Procedure details:    Lumbar space:  L4-L5 interspace   Needle gauge:  20   Needle type:  Spinal needle - Quincke tip   Needle length (in):  3.5   Number of attempts:  1   Opening pressure (cm H2O):  12   Fluid appearance:  Xanthochromic   Tubes of fluid:  4   Total volume (ml):  8 Post-procedure:    Puncture site:  Adhesive bandage applied   Patient tolerance of procedure:  Tolerated well, no immediate complications .Critical Care Performed by: Duffy Bruce, MD Authorized by: Duffy Bruce, MD   Critical care provider statement:    Critical care time (minutes):  75   Critical care time was exclusive of:  Separately billable  procedures and treating other patients and teaching time   Critical care was necessary to treat or prevent imminent or life-threatening deterioration of the following conditions:  Cardiac failure, circulatory failure, sepsis, metabolic crisis and CNS failure or compromise   Critical care was time spent personally by me on the following activities:  Development of treatment plan with patient or surrogate, discussions with consultants, evaluation of patient's response to treatment, examination of patient, obtaining history from patient or surrogate, ordering and performing treatments and interventions, ordering and review of laboratory studies, ordering and review of radiographic studies, pulse oximetry, re-evaluation of patient's condition and review of old charts   I assumed direction of critical care for this patient from another provider in my specialty: no     (including critical care time)  Medications Ordered in ED Medications  sodium chloride 0.9 % bolus 500 mL (has no administration in time range)  0.9 %  sodium chloride infusion (has no administration in time range)  vancomycin (VANCOCIN) 500 mg in sodium chloride 0.9 % 100 mL IVPB (has no administration in time range)  ceFEPIme (MAXIPIME) 2 g in sodium chloride 0.9 % 100 mL IVPB (has no administration in time range)  lidocaine (XYLOCAINE) 2 % (with pres) injection 200 mg (has no administration in time range)  acyclovir (ZOVIRAX) 650 mg in dextrose 5 % 100 mL IVPB (has no administration in time range)  ampicillin (OMNIPEN) 2 g in sodium chloride 0.9 % 100 mL IVPB (has no administration in time range)  sodium chloride 0.9 % bolus 2,000 mL (2,000 mLs Intravenous New Bag/Given 02/09/19 1906)  ceFEPIme (MAXIPIME) 2 g in sodium chloride 0.9 % 100 mL IVPB (0 g Intravenous Stopped 02/09/19 1836)  metroNIDAZOLE (FLAGYL) IVPB 500 mg (0 mg Intravenous Stopped 02/09/19 1843)  vancomycin (VANCOCIN) IVPB 1000 mg/200 mL premix (0 mg Intravenous Stopped  02/09/19 1845)  LORazepam (ATIVAN) injection 0.5 mg (0.5 mg Intravenous Given 02/09/19 1713)  gadobutrol (GADAVIST) 1 MMOL/ML injection 7 mL (7 mLs Intravenous Contrast Given 02/09/19 2020)     Initial Impression / Assessment and Plan / ED Course  I have reviewed the triage vital signs and the nursing notes.  Pertinent labs & imaging results that were available during my care of the patient were reviewed by me and considered in my medical decision making (see chart for details).  Clinical Course as of Feb 09 17  Wed Feb 09, 2019  1601 63 yo M here w/ fall, increased slurred speech. Suspect likely mechanical fall given h/o cervical dystonia, now possibly c/b profound dehydration, metabolic encephalopathy, and rhabdo. Pt with significant bruising on exam - will need CT, plan to f/u renal function. CT Head/C-Spine initially ordered. Occult sepsis also a consideration, though was normal up to 3 days ago per sister's report.    [CI]  1622 Notified of cerebral edema, ICH - possible temporal contusion though also consideration of mass, infarct.    [CI]  1633 Case discussed with Dr. Doreatha Martin, Ortho. Sling placed, pain control PRN. Denies pain on my assessment. Compartments are soft.   [CI]  1808 Labs reviewed.  Patient with mild leukocytosis, elevated CK consistent with rhabdomyolysis and moderate AK I which is likely prerenal as well as ATN secondary to rhabdo.  Lactic acid minimally elevated.  Patient has been given fluids and empiric antibiotics.  CT scan shows ecchymosis but no intra-abdominal emergency.  Case discussed with Dr. Leonel Ramsay of neurology, who recommends emergent MRI.  Patient will need to be admitted to medicine.  Blood culture sent.  UA is pending.   [CI]    Clinical Course User Index [CI] Duffy Bruce, MD       MRI obtained and is reviewed as above - shows possible herpes encephalitis, though CVA also a consideration. Discussed with Dr. Cheral Marker. LP performed by myself w/  consent from pt - fluid yellow-tinged. Will be admitted for acute encephalopathy 2/2 possible encephalitis, TBI, rhabdo and AKI. I notified admitting team, recommending monitored/step down bed. Pt has been given Ativan, will need to fu with Dr. Yvetta Coder note re: need for ppx AEDs.  Final Clinical Impressions(s) / ED Diagnoses   Final diagnoses:  Fall, initial encounter  Traumatic rhabdomyolysis, initial encounter [facility])  AKI (acute kidney injury) [facility])  Dehydration  Encephalitis    ED Discharge Orders    None       Duffy Bruce, MD 02/10/19 [place]    Duffy Bruce, MD 02/10/19 Judithann Sauger    Duffy Bruce, MD 02/10/19 [phone]

## 2019-02-09 NOTE — ED Notes (Signed)
1000cc nss infuaed 1000 ml added to iv  Waiting on mri  All antiobiotixs infused

## 2019-02-09 NOTE — ED Triage Notes (Signed)
Pt from home today found in floor by family .has been in the floor since Monday night , cbg 179 , pt has multiple bruises on his body and left arm is swollen

## 2019-02-09 NOTE — ED Notes (Signed)
The pt can go to [facility] now  [facility] notified  They will call whenb they can take him

## 2019-02-09 NOTE — ED Notes (Signed)
Critical lactic of 3.4 reported to [facility]

## 2019-02-09 NOTE — ED Notes (Signed)
To [facility] 

## 2019-02-09 NOTE — ED Notes (Signed)
Pt back from imaging

## 2019-02-09 NOTE — Progress Notes (Signed)
Ortho Trauma  Reviewed imaging. Severely displaced left proximal humerus fracture. With medical comorbidities would recommend sling and NWB with nonoperative treatment for now. Will consult on patient in AM.  Shona Needles, MD [facility] [phone] (phone) [phone] (office) [email or web address]

## 2019-02-10 ENCOUNTER — Inpatient Hospital Stay (HOSPITAL_COMMUNITY): Payer: Medicare Other

## 2019-02-10 DIAGNOSIS — R471 Dysarthria and anarthria: Secondary | ICD-10-CM

## 2019-02-10 DIAGNOSIS — E785 Hyperlipidemia, unspecified: Secondary | ICD-10-CM

## 2019-02-10 DIAGNOSIS — T796XXA Traumatic ischemia of muscle, initial encounter: Secondary | ICD-10-CM

## 2019-02-10 DIAGNOSIS — W19XXXA Unspecified fall, initial encounter: Secondary | ICD-10-CM

## 2019-02-10 DIAGNOSIS — B004 Herpesviral encephalitis: Secondary | ICD-10-CM | POA: Insufficient documentation

## 2019-02-10 DIAGNOSIS — Z87891 Personal history of nicotine dependence: Secondary | ICD-10-CM

## 2019-02-10 DIAGNOSIS — R509 Fever, unspecified: Secondary | ICD-10-CM

## 2019-02-10 DIAGNOSIS — N179 Acute kidney failure, unspecified: Secondary | ICD-10-CM

## 2019-02-10 DIAGNOSIS — R4182 Altered mental status, unspecified: Secondary | ICD-10-CM

## 2019-02-10 DIAGNOSIS — E860 Dehydration: Secondary | ICD-10-CM

## 2019-02-10 DIAGNOSIS — S42202A Unspecified fracture of upper end of left humerus, initial encounter for closed fracture: Secondary | ICD-10-CM

## 2019-02-10 LAB — BLOOD CULTURE ID PANEL (REFLEXED)

## 2019-02-10 LAB — CBC
HCT: 23.4 % — ABNORMAL LOW (ref 39.0–52.0)
Hemoglobin: 7.7 g/dL — ABNORMAL LOW (ref 13.0–17.0)
MCH: 30 pg (ref 26.0–34.0)
MCHC: 32.9 g/dL (ref 30.0–36.0)
MCV: 91.1 fL (ref 80.0–100.0)
Platelets: 392 10*3/uL (ref 150–400)
RBC: 2.57 MIL/uL — ABNORMAL LOW (ref 4.22–5.81)
RDW: 13.3 % (ref 11.5–15.5)
WBC: 8.6 10*3/uL (ref 4.0–10.5)
nRBC: 0.3 % — ABNORMAL HIGH (ref 0.0–0.2)

## 2019-02-10 LAB — CSF CELL COUNT WITH DIFFERENTIAL
Lymphs, CSF: 93 % — ABNORMAL HIGH (ref 40–80)
Lymphs, CSF: 95 % — ABNORMAL HIGH (ref 40–80)
Monocyte-Macrophage-Spinal Fluid: 5 % — ABNORMAL LOW (ref 15–45)
Monocyte-Macrophage-Spinal Fluid: 6 % — ABNORMAL LOW (ref 15–45)
RBC Count, CSF: 200 /mm3 — ABNORMAL HIGH
RBC Count, CSF: 270 /mm3 — ABNORMAL HIGH
Segmented Neutrophils-CSF: 1 % (ref 0–6)
Tube #: 1
Tube #: 4
WBC, CSF: 335 /mm3 (ref 0–5)
WBC, CSF: 355 /mm3 (ref 0–5)

## 2019-02-10 LAB — PROTEIN AND GLUCOSE, CSF
Glucose, CSF: 60 mg/dL (ref 40–70)
Total  Protein, CSF: 470 mg/dL — ABNORMAL HIGH (ref 15–45)

## 2019-02-10 LAB — BASIC METABOLIC PANEL
Anion gap: 14 (ref 5–15)
BUN: 46 mg/dL — ABNORMAL HIGH (ref 6–20)
CO2: 21 mmol/L — ABNORMAL LOW (ref 22–32)
Calcium: 8.4 mg/dL — ABNORMAL LOW (ref 8.9–10.3)
Chloride: 110 mmol/L (ref 98–111)
Creatinine, Ser: 0.95 mg/dL (ref 0.61–1.24)
GFR calc Af Amer: >60 mL/min (ref >60)
GFR calc non Af Amer: >60 mL/min (ref >60)
Glucose, Bld: 125 mg/dL — ABNORMAL HIGH (ref 70–99)
Potassium: 4 mmol/L (ref 3.5–5.1)
Sodium: 145 mmol/L (ref 135–145)

## 2019-02-10 LAB — LACTIC ACID, PLASMA
Lactic Acid, Venous: 1.4 mmol/L (ref 0.5–1.9)
Lactic Acid, Venous: 1.3 mmol/L (ref 0.5–1.9)

## 2019-02-10 LAB — HIV ANTIBODY (ROUTINE TESTING W REFLEX): HIV Screen 4th Generation wRfx: NONREACTIVE

## 2019-02-10 LAB — TSH: TSH: 0.027 u[IU]/mL — ABNORMAL LOW (ref 0.350–4.500)

## 2019-02-10 LAB — CK
Total CK: 1988 U/L — ABNORMAL HIGH (ref 49–397)
Total CK: 1605 U/L — ABNORMAL HIGH (ref 49–397)

## 2019-02-10 MED ORDER — SODIUM CHLORIDE 0.9 % IV SOLN
INTRAVENOUS | Status: DC
  Administered 2019-02-10: 22:00:00 via INTRAVENOUS

## 2019-02-10 MED ORDER — SODIUM CHLORIDE 0.9 % IV SOLN
2.0000 g | Freq: Three times a day (TID) | INTRAVENOUS | Status: DC
  Administered 2019-02-10: 2 g via INTRAVENOUS
  Filled 2019-02-10 (×3): qty 2

## 2019-02-10 MED ORDER — CHLORHEXIDINE GLUCONATE 0.12 % MT SOLN
15.0000 mL | Freq: Two times a day (BID) | OROMUCOSAL | Status: DC
  Administered 2019-02-10 – 2019-03-04 (×43): 15 mL via OROMUCOSAL
  Filled 2019-02-10 (×41): qty 15

## 2019-02-10 MED ORDER — SODIUM CHLORIDE 0.9 % IV BOLUS
1000.0000 mL | Freq: Once | INTRAVENOUS | Status: AC
  Administered 2019-02-10: 1000 mL via INTRAVENOUS

## 2019-02-10 MED ORDER — ACETAMINOPHEN 650 MG RE SUPP
650.0000 mg | RECTAL | Status: DC | PRN
  Administered 2019-02-10 – 2019-02-13 (×4): 650 mg via RECTAL
  Filled 2019-02-10 (×7): qty 1

## 2019-02-10 MED ORDER — MORPHINE SULFATE (PF) 2 MG/ML IV SOLN: 2.0000 mg | INTRAVENOUS | Status: DC | PRN

## 2019-02-10 MED ORDER — ORAL CARE MOUTH RINSE
15.0000 mL | Freq: Two times a day (BID) | OROMUCOSAL | Status: DC
  Administered 2019-02-10 – 2019-03-04 (×38): 15 mL via OROMUCOSAL

## 2019-02-10 NOTE — Consult Note (Signed)
[facility] for Infectious Disease     Date of Admission:  02/09/2019                                     Total days of antibiotics 2             Vancomycin, Ampicillin, Acyclovir, Metronidazole, Cefepime                                                                                                                                                   Reason for Consult: ?HSV encephalitis                                  Referring Provider: Hensel Primary Care Provider: Marco Collie, MD   Assessment: Patrick Daniel is a 63 y.o. male with altered mental status, fever and AKI at presentation to the hospital after apparently having found unresponsive for a prolonged time frame. He has rhabdomyolysis and AKI with unknown down time of up to 3 days. 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 We will continue his IV acyclovir q8h with total projected therapy duration 21 days. If he continues to have improvement could consider transition to oral valtrex later in therapy to complete. He has already shown some improvement in cognition since earlier this morning and is more responsive and making eye contact with attempts to communicate. 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    He will need ongoing close attention to monitoring renal status as this can cause further kidney damage.  He has gram positive cocci in 1/2 blood cultures with methicillin resistance gene detected on biofire. This is likely a skin contaminant but for now will continue vancomycin until we get more information from work up.     Plan: 1. Continue acyclovir IV q8h 2. Stop cefepime 3. Continue Vancomycin for now pending further data from micro 4. Follow blood and csf cultures as well as HSV pcr.  5. Hold on PICC line    Active Problems:   Altered  mental status   AKI (acute kidney injury) ([place])   Dehydration   Encephalitis   Fall   Traumatic rhabdomyolysis ([place])   Unspecified fracture of upper end of left humerus, initial encounter for closed fracture   . chlorhexidine  15 mL Mouth Rinse BID  . lidocaine  10 mL Intradermal Once  . mouth rinse  15 mL Mouth Rinse q12n4p    ZMO:QHUTM Ammonsis a 62 y.o.malepresenting with AMS after being found down. PMH is significant forcervical dystonia, dysarthria, HLD, gait abnormality (?  CNS degenerative disorder?)  Patrick Daniel was found down at his independent living facility with last known normal 3 days prior to. At the time he was found to have intelligible speech. He was brought to [facility] for evaluation - he was found to have temporal lobe edema on head CT with MRI revealing right temporal lobe enhancement concerning for subacute infarct vs HSV encephalitis. LP was obtained to assess the latter with WBC 355 (93% lymphs), RBC 270, Protein 470, Glucose 60 with negative gram stain and no growth @ 12 hours from CSF. 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  He was initially started on empiric antibiotics/antivirals for meningitis and now continues on acyclovir, cefepime and vancomycin. He has defervesced since admission. Per his nurse he has been agitated over night and pulling at some of his medical equipment.   History was obtained from chart as the patient is incoherent and minimally responsive. He lives in an assisted living facility due to baseline health problems. He has a sister Arlis Porta) that is a contact for him.    Review of Systems: Review of Systems  Unable to perform ROS: Mental status change    Past Medical History:  Diagnosis Date  . Dysarthria   . Dystonia   . Gait disturbance   . Myelopathy ([place])   . Paraplegia [facility])     Social History   Tobacco Use  . Smoking  status: Former Smoker    Types: Cigarettes    Last attempt to quit: 02/01/2013    Years since quitting: 6.0  . Smokeless tobacco: Never Used  . Tobacco comment: quit   Substance Use Topics  . Alcohol use: No  . Drug use: No    Family History  Problem Relation Age of Onset  . Heart disease Mother   . Cancer Mother   . Heart Problems Father   . High Cholesterol Sister   . High blood pressure Sister   . Diabetes Sister   . High blood pressure Sister    Allergies  Allergen Reactions  . Baclofen     Weakness, drowsiness    OBJECTIVE: Blood pressure (!) 154/76, pulse (!) 118, temperature 98.4 F (36.9 C), temperature source Oral, resp. rate 18, height 5\' 6"  (1.676 m), weight 65 kg, SpO2 99 %.  Physical Exam Constitutional:      Comments: Laying in bed, minimally responsive but groaning at times. No acute distress.   HENT:     Mouth/Throat:     Mouth: Mucous membranes are dry. No oral lesions.     Dentition: Normal dentition. No dental caries.  Eyes:     General: No scleral icterus. Cardiovascular:     Rate and Rhythm: Normal rate and regular rhythm.     Heart sounds: Normal heart sounds.  Pulmonary:     Effort: Pulmonary effort is normal.     Breath sounds: Normal breath sounds.     Comments: Labored breathing Abdominal:     General: There is no distension.     Palpations: Abdomen is soft.     Tenderness: There is no abdominal tenderness.  Lymphadenopathy:     Cervical: No cervical adenopathy.  Skin:    General: Skin is warm and dry.     Capillary Refill: Capillary refill takes less than 2 seconds.     Findings: No rash.  Neurological:     Mental Status: He is alert.  Comments: Unable to assess orientation - minimally verbal. Making eye contact later in afternoon on repeat assessment      Lab Results Lab Results  Component Value Date   WBC 8.6 02/10/2019   HGB 7.7 (L) 02/10/2019   HCT 23.4 (L) 02/10/2019   MCV 91.1 02/10/2019   PLT 392 02/10/2019     Lab Results  Component Value Date   CREATININE 0.95 02/10/2019   BUN 46 (H) 02/10/2019   NA 145 02/10/2019   K 4.0 02/10/2019   CL 110 02/10/2019   CO2 21 (L) 02/10/2019    Lab Results  Component Value Date   ALT 46 (H) 02/09/2019   AST 54 (H) 02/09/2019   ALKPHOS 68 02/09/2019   BILITOT 1.9 (H) 02/09/2019     Microbiology: Recent Results (from the past 240 hour(s))  Blood culture (routine x 2)     Status: None (Preliminary result)   Collection Time: 02/09/19  5:08 PM  Result Value Ref Range Status   Specimen Description BLOOD RIGHT ANTECUBITAL  Final   Special Requests   Final    BOTTLES DRAWN AEROBIC AND ANAEROBIC Blood Culture results may not be optimal due to an inadequate volume of blood received in culture bottles   Culture  Setup Time   Final    GRAM POSITIVE COCCI IN CLUSTERS IN BOTH AEROBIC AND ANAEROBIC BOTTLES Organism ID to follow CRITICAL RESULT CALLED TO, READ BACK BY AND VERIFIED WITH: PHARMD ERIN DEJA AT 4696 ON 02/10/2019 BY MHOUEGNIFIO Performed at [facility], [place] [address] [place], [place] [address]    Culture GRAM POSITIVE COCCI IN CLUSTERS  Final   Report Status PENDING  Incomplete  Blood Culture ID Panel (Reflexed)     Status: Abnormal   Collection Time: 02/09/19  5:08 PM  Result Value Ref Range Status   Enterococcus species NOT DETECTED NOT DETECTED Final   Listeria monocytogenes NOT DETECTED NOT DETECTED Final   Staphylococcus species DETECTED (A) NOT DETECTED Final    Comment: Methicillin (oxacillin) resistant coagulase negative staphylococcus. Possible blood culture contaminant (unless isolated from more than one blood culture draw or clinical case suggests pathogenicity). No antibiotic treatment is indicated for blood  culture contaminants. CRITICAL RESULT CALLED TO, READ BACK BY AND VERIFIED WITH: PHARMD ERIN DEJA AT 1332 ON 02/10/2019 BY MHOUEGNIFIO    Staphylococcus aureus (BCID) NOT DETECTED NOT DETECTED Final   Methicillin  resistance DETECTED (A) NOT DETECTED Final    Comment: CRITICAL RESULT CALLED TO, READ BACK BY AND VERIFIED WITH: PHARMD ERIN DEJA AT 1332 ON 02/10/2019 BY MHOUEGNIFIO    Streptococcus species NOT DETECTED NOT DETECTED Final   Streptococcus agalactiae NOT DETECTED NOT DETECTED Final   Streptococcus pneumoniae NOT DETECTED NOT DETECTED Final   Streptococcus pyogenes NOT DETECTED NOT DETECTED Final   Acinetobacter baumannii NOT DETECTED NOT DETECTED Final   Enterobacteriaceae species NOT DETECTED NOT DETECTED Final   Enterobacter cloacae complex NOT DETECTED NOT DETECTED Final   Escherichia coli NOT DETECTED NOT DETECTED Final   Klebsiella oxytoca NOT DETECTED NOT DETECTED Final   Klebsiella pneumoniae NOT DETECTED NOT DETECTED Final   Proteus species NOT DETECTED NOT DETECTED Final   Serratia marcescens NOT DETECTED NOT DETECTED Final   Haemophilus influenzae NOT DETECTED NOT DETECTED Final   Neisseria meningitidis NOT DETECTED NOT DETECTED Final   Pseudomonas aeruginosa NOT DETECTED NOT DETECTED Final   Candida albicans NOT DETECTED NOT DETECTED Final   Candida glabrata NOT DETECTED NOT DETECTED Final  Candida krusei NOT DETECTED NOT DETECTED Final   Candida parapsilosis NOT DETECTED NOT DETECTED Final   Candida tropicalis NOT DETECTED NOT DETECTED Final    Comment: Performed at [facility], [place] [address], [place], [place] [address]  Blood culture (routine x 2)     Status: None (Preliminary result)   Collection Time: 02/09/19  5:15 PM  Result Value Ref Range Status   Specimen Description BLOOD LEFT ANTECUBITAL  Final   Special Requests   Final    BOTTLES DRAWN AEROBIC AND ANAEROBIC Blood Culture adequate volume   Culture   Final    NO GROWTH < 24 HOURS Performed at [facility], [place] [address] [place], [place] [address]    Report Status PENDING  Incomplete  CSF culture     Status: None (Preliminary result)   Collection Time: 02/09/19 10:45 PM  Result Value Ref  Range Status   Specimen Description CSF  Final   Special Requests Normal  Final   Gram Stain   Final    WBC PRESENT, PREDOMINANTLY MONONUCLEAR NO ORGANISMS SEEN CYTOSPIN SMEAR    Culture   Final    NO GROWTH < 12 HOURS Performed at [facility], [place] [address], [place], [place] [address]    Report Status PENDING  Incomplete  Culture, fungus without smear     Status: None (Preliminary result)   Collection Time: 02/09/19 10:45 PM  Result Value Ref Range Status   Specimen Description CSF  Final   Special Requests Normal  Final   Culture   Final    NO FUNGUS ISOLATED AFTER 1 DAY Performed at [facility], [place] [address], [place], [place] [address]    Report Status PENDING  Incomplete    Janene Madeira, MSN, NP-C [facility] [place] Cell: [phone] Pager: [phone]  02/10/2019

## 2019-02-10 NOTE — Progress Notes (Signed)
Family Medicine Teaching Service Daily Progress Note Intern Pager: [phone]  Patient name: Patrick Daniel Medical record number: [id number] Date of birth: [birth year] Age: 63 y.o. Gender: male  Primary Care Provider: Marco Collie, MD Consultants: neuro, ID  Code Status: FULL  Pt Overview and Major Events to Date:  4/15 admitted having been found down, AMS, neuro consulted, LP suggestive of HSV encephalitis, acyclovir started  Assessment and Plan: Patrick Daniel is a 63 y.o. male presenting with AMS after being found down. PMH is significant for cervical dystonia, dysarthria, HLD, gait abnormality (?CNS degenerative disorder?).  AMS s/p fall- found down at independent living facility, last seen normal 3 days ago. Known mobility issues at baseline with frequent falls. Workup thus far suggests infectious etiology, LP suggestive of HSV encephalitis.  Treating with acyclovir.  Febrile this morning to 103, Tylenol given.  TSH is low, question euthyroid euthyroid sick syndrome -frequent neuro checks -ID consulted for HSV encephalitis recommendations -neurology following, appreciate input and guidance  -continue cefepime, vanc, acyclovir -follow up culture -follow blood and urine cx -PT/OT/SLP consults -am CBC, BMP -up with assistance -APAP prn fever -Recheck TSH as an outpatient once recovered from acute illness -repeat lactic acid, last was 2.0  Anemia: Hemoglobin on presentation was 9.8, hemoglobin this morning 7.7.  No symptoms of bleeding, remaining cell lines decreased in the setting of fluid resuscitation. -Daily CBC -iron studies on next labs  Rhabdomyolysis  Dehydration- unknown down time, CK 1751>1988, Cr 1.38. patient appears dehydrated on exam. Received 2.5L total NS bolus in ED. -IVF at 150 cc/hr -am BMP and CK -I/O   L humeral neck fracture- mildly comminuted fx of L humeral neck noticed w/ significant bruising to LUE secondary to fall at home -ortho following, to see in  AM- recommending sling for now -closely monitor pain, pt denies pain this am -morphine 2mg  q4 prn  Dysarthria- chronic, worsened- from neurology notes this has been progressive since 2009, there is suspicion for a neurodegenerative disorder from reviewing outpatient neuro notes. This may be worsened in setting of head trauma vs infection vs new stroke as above -SLP c/s -neuro following  Cervical dystonia- chronic, stable- patient follows at [facility] for botox injections, last injection 12/01/18  FEN/GI: NPO due to AMS Prophylaxis: SCDs  Disposition: continued inpt workup of AMS, rhabdo  Subjective:  Patient responds to name, denies pain.  Speech is somewhat unintelligible.  Objective: Temp:  [97.8 F (36.6 C)-103.2 F (39.6 C)] 103.2 F (39.6 C) (04/16 0802) Pulse Rate:  [30-139] 139 (04/16 0802) Resp:  [17-31] 24 (04/16 0802) BP: (132-159)/(63-105) 144/72 (04/16 0802) SpO2:  [88 %-100 %] 98 % (04/16 0802) Weight:  [65 kg] 65 kg (04/16 0016) Physical Exam: General: Male appearing older than stated age, lying in bed with mittens on Cardiovascular: Tachycardic, no murmur Respiratory: Clear to auscultation bilaterally, easy work of breathing Abdomen: Soft nontender nondistended Extremities: No edema, left shoulder in sling Neuro: Tracks with eyes, is able to move right upper extremity and bilateral lower extremities, left upper extremity in sling  Laboratory: Recent Labs  Lab 02/09/19 1529 02/10/19 0451  WBC 12.4* 8.6  HGB 9.8* 7.7*  HCT 29.0* 23.4*  PLT 444* 392   Recent Labs  Lab 02/09/19 1529 02/10/19 0451  NA 138 145  K 4.6 4.0  CL 97* 110  CO2 21* 21*  BUN 81* 46*  CREATININE 1.38* 0.95  CALCIUM 9.4 8.4*  PROT 8.1  --   BILITOT 1.9*  --  ALKPHOS 68  --   ALT 46*  --   AST 54*  --   GLUCOSE 143* 125*    Imaging/Diagnostic Tests: Ct Abdomen Pelvis Wo Contrast  Result Date: 02/09/2019 CLINICAL DATA:  Fall. Found on floor. Left  flank hematoma. EXAM: CT ABDOMEN AND PELVIS WITHOUT CONTRAST TECHNIQUE: Multidetector CT imaging of the abdomen and pelvis was performed following the standard protocol without IV contrast. COMPARISON:  12/04/2018 FINDINGS: Lower chest: Motion artifact through the lung bases. No lung consolidation or pleural effusion. Hepatobiliary: Subcentimeter low-density liver lesions on the prior study are partially obscured by artifact from the patient's arms today. Small gallstones are again seen. No biliary dilatation is evident. Pancreas: Unremarkable. Spleen: Unremarkable. Adrenals/Urinary Tract: Unremarkable adrenal glands. Unchanged 1.9 cm low-density right lower pole renal lesion compatible with a cyst on the prior study. No renal calculi or hydronephrosis. Apparent mild bladder wall thickening is attributed to underdistention. Stomach/Bowel: The stomach is unremarkable. A large amount of stool is present in the rectum which is distended to 8 cm in diameter. There is a moderate amount of stool in the sigmoid colon. There is no evidence of bowel obstruction. The appendix is unremarkable. Vascular/Lymphatic: Abdominal aortic atherosclerosis without aneurysm. No enlarged lymph nodes. Reproductive: Unremarkable prostate. Other: No ascites or pneumoperitoneum. No retroperitoneal hematoma. Mild stranding in the left lateral flank subcutaneous fat consistent with contusion. Musculoskeletal: No acute osseous abnormality or suspicious osseous lesion. IMPRESSION: 1. Mild left flank superficial soft tissue contusion. 2. No other evidence of acute traumatic injury in the abdomen or pelvis. 3. Large amount of stool in the rectum.  No bowel obstruction. 4. Cholelithiasis. 5.  Aortic Atherosclerosis (ICD10-I70.0). Electronically Signed   By: Logan Bores M.D.   On: 02/09/2019 17:11   Dg Chest 1 View  Result Date: 02/09/2019 CLINICAL DATA:  Found on floor today with left upper extremity bruising. EXAM: CHEST  1 VIEW COMPARISON:   None. FINDINGS: Lungs are adequately inflated and otherwise clear. Cardiomediastinal silhouette is normal. Fusion hardware intact over the cervicothoracic region. Moderately displaced slightly comminuted left humeral neck fracture. IMPRESSION: No acute cardiopulmonary disease. Moderately displaced slightly comminuted left humeral neck fracture. Electronically Signed   By: Marin Olp M.D.   On: 02/09/2019 16:14   Dg Elbow Complete Left  Result Date: 02/09/2019 CLINICAL DATA:  Found on floor with left arm pain. EXAM: LEFT ELBOW - COMPLETE 3+ VIEW COMPARISON:  None. FINDINGS: There is no evidence of fracture, dislocation, or joint effusion. There is no evidence of arthropathy or other focal bone abnormality. Soft tissues are unremarkable. IMPRESSION: Negative. Electronically Signed   By: Marin Olp M.D.   On: 02/09/2019 16:14   Ct Head Wo Contrast  Result Date: 02/09/2019 CLINICAL DATA:  Found on floor with multiple bruises. EXAM: CT HEAD WITHOUT CONTRAST CT CERVICAL SPINE WITHOUT CONTRAST TECHNIQUE: Multidetector CT imaging of the head and cervical spine was performed following the standard protocol without intravenous contrast. Multiplanar CT image reconstructions of the cervical spine were also generated. COMPARISON:  Head and cervical spine CT 05/18/2017. Brain MRI 05/04/2015. FINDINGS: CT HEAD FINDINGS Brain: There is edema anteriorly and medially in the right temporal lobe including diffuse hippocampal edema with regional sulcal effacement. There is no midline shift. Trace hyperdense blood products are present in the occipital horns of both lateral ventricles without convincing subarachnoid hemorrhage seen elsewhere. No extra-axial fluid collection is identified. There is moderate cerebellar atrophy. Vascular: Calcified atherosclerosis at the skull base. No hyperdense vessel. Skull: No fracture  or focal osseous lesion. Sinuses/Orbits: Clear paranasal sinuses. Chronic appearing left mastoid effusion.  Bilateral cataract extraction. Other: Minimal frontal scalp swelling. CT CERVICAL SPINE FINDINGS Alignment: Normal. Skull base and vertebrae: Prominent median C1-2 arthropathy. Solid C4-C6 ACDF. Solid instrumented posterior fusion C2-T2. Posterior decompression C3-C7. Chronic extension of the left C2 screw into the lateral aspect of the spinal canal. No acute fracture or destructive osseous process. Soft tissues and spinal canal: No prevertebral fluid or swelling. No visible canal hematoma. Disc levels: Posterior decompression and fusion changes as above. Mild-to-moderate osseous neural foraminal stenosis on the right at C5-6. Upper chest: Clear lung apices. Other: None. IMPRESSION: 1. Edema throughout the mesial right temporal lobe. Considerations include acute to subacute infarct, infection, and tumor. This is more extensive than is typically seen with traumatic temporal lobe contusion. Brain MRI (preferably without and with contrast) is recommended for further evaluation. 2. Trace hemorrhage in both lateral ventricles. 3. No acute cervical spine fracture or subluxation. Critical Value/emergent results were called by telephone at the time of interpretation on 02/09/2019 at 4:15 pm to Dr. Duffy Bruce, who verbally acknowledged these results. Electronically Signed   By: Logan Bores M.D.   On: 02/09/2019 16:20   Ct Cervical Spine Wo Contrast  Result Date: 02/09/2019 CLINICAL DATA:  Found on floor with multiple bruises. EXAM: CT HEAD WITHOUT CONTRAST CT CERVICAL SPINE WITHOUT CONTRAST TECHNIQUE: Multidetector CT imaging of the head and cervical spine was performed following the standard protocol without intravenous contrast. Multiplanar CT image reconstructions of the cervical spine were also generated. COMPARISON:  Head and cervical spine CT 05/18/2017. Brain MRI 05/04/2015. FINDINGS: CT HEAD FINDINGS Brain: There is edema anteriorly and medially in the right temporal lobe including diffuse hippocampal edema  with regional sulcal effacement. There is no midline shift. Trace hyperdense blood products are present in the occipital horns of both lateral ventricles without convincing subarachnoid hemorrhage seen elsewhere. No extra-axial fluid collection is identified. There is moderate cerebellar atrophy. Vascular: Calcified atherosclerosis at the skull base. No hyperdense vessel. Skull: No fracture or focal osseous lesion. Sinuses/Orbits: Clear paranasal sinuses. Chronic appearing left mastoid effusion. Bilateral cataract extraction. Other: Minimal frontal scalp swelling. CT CERVICAL SPINE FINDINGS Alignment: Normal. Skull base and vertebrae: Prominent median C1-2 arthropathy. Solid C4-C6 ACDF. Solid instrumented posterior fusion C2-T2. Posterior decompression C3-C7. Chronic extension of the left C2 screw into the lateral aspect of the spinal canal. No acute fracture or destructive osseous process. Soft tissues and spinal canal: No prevertebral fluid or swelling. No visible canal hematoma. Disc levels: Posterior decompression and fusion changes as above. Mild-to-moderate osseous neural foraminal stenosis on the right at C5-6. Upper chest: Clear lung apices. Other: None. IMPRESSION: 1. Edema throughout the mesial right temporal lobe. Considerations include acute to subacute infarct, infection, and tumor. This is more extensive than is typically seen with traumatic temporal lobe contusion. Brain MRI (preferably without and with contrast) is recommended for further evaluation. 2. Trace hemorrhage in both lateral ventricles. 3. No acute cervical spine fracture or subluxation. Critical Value/emergent results were called by telephone at the time of interpretation on 02/09/2019 at 4:15 pm to Dr. Duffy Bruce, who verbally acknowledged these results. Electronically Signed   By: Logan Bores M.D.   On: 02/09/2019 16:20   Mr Brain W And Wo Contrast  Result Date: 02/09/2019 CLINICAL DATA:  Recent fall.  Possible intracranial  hemorrhage. EXAM: MRI HEAD WITHOUT AND WITH CONTRAST TECHNIQUE: Multiplanar, multiecho pulse sequences of the brain and surrounding structures were obtained  without and with intravenous contrast. CONTRAST:  7 mL Gadavist COMPARISON:  Head CT 02/09/2019 FINDINGS: BRAIN: There is abnormal diffusion restriction within the mesial right temporal lobe. There is a small amount of blood dependently within both lateral ventricles. The midline structures are normal. Moderate medial right temporal lobe edema. Minimal white matter hyperintensity, nonspecific and commonly seen in asymptomatic patients of this age. The cerebral and cerebellar volume are age-appropriate. No hydrocephalus. Susceptibility-sensitive sequences show no chronic microhemorrhage or superficial siderosis. No abnormal contrast enhancement. VASCULAR: The major intracranial arterial and venous sinus flow voids are normal. SKULL AND UPPER CERVICAL SPINE: Calvarial bone marrow signal is normal. There is no skull base mass. Visualized upper cervical spine and soft tissues are normal. SINUSES/ORBITS: No fluid levels or advanced mucosal thickening. No mastoid or middle ear effusion. The orbits are normal. IMPRESSION: 1. Abnormal diffusion restriction within the mesial right temporal lobe, most consistent with acute or early subacute infarct. This distribution may also be seen in infection, particularly herpes encephalitis, though this seems less likely in this presentation, particularly if the patient is afebrile. 2. Trace intraventricular blood products within the occipital horns of both lateral ventricles. Electronically Signed   By: Ulyses Jarred M.D.   On: 02/09/2019 20:34   Dg Humerus Left  Result Date: 02/09/2019 CLINICAL DATA:  Found on floor with left arm pain. EXAM: LEFT HUMERUS - 2+ VIEW COMPARISON:  None. FINDINGS: Exam demonstrates a moderately displaced left humeral neck fracture with mild comminution and extending into the humeral head with mild  comminution of the humeral head. There is a fracture fragment adjacent the medial aspect of the humeral neck likely from the humeral fracture, although cannot completely exclude an adjacent glenoid fracture. Remainder the exam is unremarkable. IMPRESSION: Moderately displaced mildly comminuted left humeral neck fracture with extension into the humeral head with mild comminution of the humeral head. It would be difficult to exclude an adjacent glenoid fracture. Electronically Signed   By: Marin Olp M.D.   On: 02/09/2019 16:18     Sela Hilding, MD 02/10/2019, 8:31 AM PGY-3, [place] Intern pager: [phone], text pages welcome

## 2019-02-10 NOTE — Progress Notes (Addendum)
NEUROLOGY PROGRESS NOTE  Subjective: Patient currently has a temperature of 103.  He is severely dehydrated in the mouth and having significant difficulty answering questions.  He does nod his head to answers.  When asked if he is in the hospital he shook his head no, when asked if it is April he shook his head no but with some and watch he did shake his head yes.  Patient looks in comfortable secondary to significant fever  Exam: Vitals:   02/10/19 0802 02/10/19 0929  BP: (!) 144/72 (!) 163/71  Pulse: (!) 139 (!) 135  Resp: (!) 24 (!) 24  Temp: (!) 103.2 F (39.6 C) (!) 102.9 F (39.4 C)  SpO2: 98%     Physical Exam   HEENT-  Normocephalic, no lesions, without obvious abnormality.  Normal external eye and conjunctiva.  Extremely dry oral [place] Extremities- Warm, dry and intact Musculoskeletal-no joint tenderness, deformity or swelling Skin-warm and dry, no hyperpigmentation, vitiligo, or suspicious lesions    Neuro:  Mental Status: Patient is an alert not oriented appears uncomfortable and confused. Cranial Nerves: II: Blinks to threat bilaterally III,IV, VI: ptosis not present, extra-ocular motions intact bilaterally with saccadic pursuit.  Pupils equal, round, reactive to light and accommodation V,VII: Face symmetric, reacts to touch bilaterally VIII: Shakes head to questions IX,X: Unable to vocally state answers secondary to dry oromucosa XI: bilateral shoulder shrug XII: midline tongue extension Motor: As previous exam motor is significantly impaired due to confusion.  He is moving his right upper extremity antigravity.  Has a left humerus fracture thus left upper extremity was not tested secondary to pain.  Bilateral lower extremity withdraws from noxious stimuli Sensory: Withdraws from noxious stimuli right upper extremity bilateral lower extremities Deep Tendon Reflexes: 2+ and and brisk but symmetric throughout Plantars: Upgoing bilaterally     Medications:   Scheduled: . lidocaine  10 mL Intradermal Once    Pertinent Labs/Diagnostics: Total CK 1988 HSV CSF by PCR pending  Results for DEMARRIUS, GUERRERO (MRN [id number]) as of 02/10/2019 10:33  Ref. Range 02/09/2019 22:45 02/09/2019 22:45  Appearance, CSF Latest Ref Range: CLEAR  CLEAR CLEAR  Glucose, CSF Latest Ref Range: 40 - 70 mg/dL 60   RBC Count, CSF Latest Ref Range: 0 /cu mm 270 (H) 200 (H)  WBC, CSF Latest Ref Range: 0 - 5 /cu mm 335 (HH) 355 (HH)  Segmented Neutrophils-CSF Latest Ref Range: 0 - 6 % 1   Lymphs, CSF Latest Ref Range: 40 - 80 % 93 (H) 95 (H)  Monocyte-Macrophage-Spinal Fluid Latest Ref Range: 15 - 45 % 6 (L) 5 (L)  Color, CSF Latest Ref Range: COLORLESS  YELLOW (A) YELLOW (A)  Supernatant Unknown XANTHOCHROMIC XANTHOCHROMIC  Total  Protein, CSF Latest Ref Range: 15 - 45 mg/dL 470 (H)   Tube # Unknown 4 1   CT Head and C-spine: 1. Edema throughout the mesial right temporal lobe. Considerations include acute to subacute infarct, infection, and tumor. This is more extensive than is typically seen with traumatic temporal lobe contusion. Brain MRI (preferably without and with contrast) is recommended for further evaluation. 2. Trace hemorrhage in both lateral ventricles. 3. No acute cervical spine fracture or subluxation.  MRI Brain: 1. Abnormal diffusion restriction within the mesial right temporal lobe, most consistent with acute or early subacute infarct. This distribution may also be seen in infection, particularly herpes encephalitis, though this seems less likely in this presentation, particularly if the patient is afebrile. 2. Trace intraventricular blood products  within the occipital horns    Assessment: 63 year old male found on the floor with multiple bruises and altered mental status.  His CSF findings are consistent with aseptic meningitis/encephalitis and his MRI shows medial temporal predilection which is fairly characteristic of HSV encephalitis.  He is  on treatment for this, and at this time I think that this is by far the most likely etiology.    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 - Will await EEG findings to make decision to start antiepileptics -Orthopedics following humerus fracture -Tylenol to control fever  Etta Quill PA-C [facility] [phone]  02/10/2019, 10:21 AM  I have seen the patient reviewed the above note.  He has no evidence of seizures on his EEG therefore I would hold on starting antiepileptics, though he is at risk for developing seizures.  He will need acyclovir.  On my exam, he is able to answer some simple questions, though he is severely dysarthric.  He does follow commands.  Roland Rack, MD [facility] [phone]  If 7pm- 7am, please page neurology on call as listed in [place].

## 2019-02-10 NOTE — Evaluation (Signed)
Occupational Therapy Evaluation Patient Details Name: Patrick Daniel MRN: [id number] DOB: 12/29/1955 Today's Date: 02/10/2019    History of Present Illness 63 y.o. male presenting with AMS after being found down at [place]. PMH is significant for cervical dystonia, dysarthria, HLD, gait abnormality (?CNS degenerative disorder?). Patient unable to provide insight to what caused fall given chronic speech issues that have acutely worsened. Known mobility issues at baseline with frequent falls. Concern for traumatic head injury after fall vs stroke vs seizure. Head CT with temporal lobe edema. MRI with concern for acute or early subacute infarct vs infection like HSV. Pt also found to have comminuted fx of L humeral neck, ortho following.    Clinical Impression   This 63 y/o male presents with the above. PTA pt was living in ILF, reports he was receiving some assist for ADL and using RW for mobility. Pt presents supine in bed and agreeable to working with therapies - limited pt activity during session as pt with elevated temp this AM. Pt requiring maxA+2 for safe completion of bed mobility, and able to sit EOB for few min with modA for sitting balance. Pt currently requires max-totalA for all aspect of UB/LB ADL; given increased time and multimodal cues pt following some simple commands, requires cues not to use or move LUE during session completion. Pt overall engaging with therapists and answering questions throughout (though not always able to fully understand due to mumbled speech). Pt HR 131-135 throughout session completion; SpO2 95-96% on RA. He will benefit from continued acute OT services and recommend follow up therapy services in SNF setting after discharge to maximize his safety and independence with ADL and mobility. Will follow.     Follow Up Recommendations  SNF;Supervision/Assistance - 24 hour    Equipment Recommendations  Other (comment)(TBA)           Precautions / Restrictions  Precautions Precautions: Fall;Shoulder;Other (comment) Type of Shoulder Precautions: sling, NWB  Shoulder Interventions: Shoulder sling/immobilizer Precaution Comments: watch HR Required Braces or Orthoses: Sling Restrictions Weight Bearing Restrictions: Yes LUE Weight Bearing: Non weight bearing      Mobility Bed Mobility Overal bed mobility: Needs Assistance Bed Mobility: Supine to Sit;Sit to Supine     Supine to sit: Max assist;+2 for physical assistance;+2 for safety/equipment Sit to supine: Max assist;+2 for physical assistance;+2 for safety/equipment   General bed mobility comments: pt able to follow some commands to assist (minimally) with transitions; assist for LEs over EOB and to elevate trunk, sitting up towards R side, pt requiring assist to stabilize and maintain sitting balance  Transfers                 General transfer comment: deferred today    Balance Overall balance assessment: Needs assistance Sitting-balance support: Feet supported Sitting balance-Leahy Scale: Poor Sitting balance - Comments: requires overall modA for sitting balance                                   ADL either performed or assessed with clinical judgement   ADL Overall ADL's : Needs assistance/impaired     Grooming: Maximal assistance;Total assistance;Bed level Grooming Details (indicate cue type and reason): assisted with oral swab due to notable dryness in mouth                             Functional mobility during ADLs: Maximal  assistance;+2 for physical assistance;+2 for safety/equipment General ADL Comments: pt currently requires max-totalA for ADL, presenting with impaired cognition, weakness and fatigue with activity; limited session to sitting EOB for few minutes as pt with increased temp and elevated HR today     Vision         Perception     Praxis      Pertinent Vitals/Pain Pain Assessment: Faces Faces Pain Scale: No hurt Pain  Intervention(s): Monitored during session     Hand Dominance Right   Extremity/Trunk Assessment Upper Extremity Assessment Upper Extremity Assessment: LUE deficits/detail LUE Deficits / Details: humeral fx, bruised, edemous in shoudler region, good grip strength and appears to have good fine motor LUE: Unable to fully assess due to pain;Unable to fully assess due to immobilization LUE Coordination: decreased gross motor   Lower Extremity Assessment Lower Extremity Assessment: Defer to PT evaluation       Communication Communication Communication: Expressive difficulties(mumbled speech)   Cognition Arousal/Alertness: Awake/alert;Lethargic(sleepy end of session due to fatigue) Behavior During Therapy: WFL for tasks assessed/performed;Flat affect Overall Cognitive Status: Impaired/Different from baseline Area of Impairment: Attention;Following commands;Safety/judgement;Awareness                   Current Attention Level: Focused   Following Commands: Follows one step commands inconsistently;Follows one step commands with increased time Safety/Judgement: Decreased awareness of deficits Awareness: Intellectual   General Comments: pt able to answer most of therapists questions during session and appears to answer appropriately, intermittently requires cues and repetition to follow commands; mumbled speech which can be unintelligible at times; requires cues/assist not to move LUE    General Comments  Pt HR 131-135 throughout session completion; pt on RA with SpO2 95-96%    Exercises     Shoulder Instructions      Home Living Family/patient expects to be discharged to:: Other (Comment)(ILF)                                 Additional Comments: from ILF      Prior Functioning/Environment Level of Independence: Needs assistance  Gait / Transfers Assistance Needed: pt reports using RW for mobility, per chart pt with history of falls ADL's / Homemaking  Assistance Needed: pt reports he has assist for ADL, though per chart review pt may have been performing without assist; sister accompanies pt to grocery store and doctors appointments            OT Problem List: Decreased strength;Decreased range of motion;Decreased activity tolerance;Impaired balance (sitting and/or standing);Decreased cognition;Decreased knowledge of precautions;Pain;Impaired UE functional use      OT Treatment/Interventions: Self-care/ADL training;Therapeutic exercise;Neuromuscular education;DME and/or AE instruction;Therapeutic activities;Patient/family education;Balance training;Cognitive remediation/compensation    OT Goals(Current goals can be found in the care plan section) Acute Rehab OT Goals Patient Stated Goal: none stated today, agreeable to working  with therapies OT Goal Formulation: With patient Time For Goal Achievement: 02/24/19 Potential to Achieve Goals: Good  OT Frequency: Min 3X/week   Barriers to D/C:            Co-evaluation PT/OT/SLP Co-Evaluation/Treatment: Yes Reason for Co-Treatment: Complexity of the patient's impairments (multi-system involvement);For patient/therapist safety;To address functional/ADL transfers   OT goals addressed during session: ADL's and self-care      AM-PAC OT "6 Clicks" Daily Activity     Outcome Measure Help from another person eating meals?: Total Help from another person taking care of personal grooming?:  Total Help from another person toileting, which includes using toliet, bedpan, or urinal?: Total Help from another person bathing (including washing, rinsing, drying)?: Total Help from another person to put on and taking off regular upper body clothing?: Total Help from another person to put on and taking off regular lower body clothing?: Total 6 Click Score: 6   End of Session Equipment Utilized During Treatment: Oxygen(reapplied end of session) Nurse Communication: Mobility status  Activity  Tolerance: Patient limited by fatigue;Treatment limited secondary to medical complications (Comment)(increased temp, limited activity for pt safety) Patient left: in bed;with call bell/phone within reach;with bed alarm set  OT Visit Diagnosis: Muscle weakness (generalized) (M62.81);Other symptoms and signs involving cognitive function;History of falling (Z91.81)                Time: 7185-5015 OT Time Calculation (min): 21 min Charges:  OT General Charges $OT Visit: 1 Visit OT Evaluation $OT Eval Moderate Complexity: 1 Mod  Lou Cal, OT [employer] Pager [phone] Office [phone]  Raymondo Band 02/10/2019, 11:53 AM

## 2019-02-10 NOTE — Consult Note (Signed)
Reason for Consult:Left humerus fx Referring Physician: B Festus Pursel is an 63 y.o. male.  HPI: Patrick Daniel was found down at the independent living facility where he resides. Down time unknown. He had suffered a mental status change and could not speak. 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 X-rays of the shoulder were obtained 2/2 noted ecchymosis and showed a humeral head fx and orthopedic surgery was consulted. He is unable to communicate at present and history was gleaned from the chart.  Past Medical History:  Diagnosis Date  . Dysarthria   . Dystonia   . Gait disturbance   . Myelopathy ([place])   . Paraplegia [facility])     Past Surgical History:  Procedure Laterality Date  . NECK SURGERY     dec 2014    Family History  Problem Relation Age of Onset  . Heart disease Mother   . Cancer Mother   . Heart Problems Father   . High Cholesterol Sister   . High blood pressure Sister   . Diabetes Sister   . High blood pressure Sister     Social History:  reports that he quit smoking about 6 years ago. His smoking use included cigarettes. He has never used smokeless tobacco. He reports that he does not drink alcohol or use drugs.  Allergies:  Allergies  Allergen Reactions  . Baclofen     Weakness, drowsiness    Medications: I have reviewed the patient's current medications.  Results for orders placed or performed during the hospital encounter of 02/09/19 (from the past 48 hour(s))  CBC with Differential     Status: Abnormal   Collection Time: 02/09/19  3:29 PM  Result Value Ref Range   WBC 12.4 (H) 4.0 - 10.5 K/uL   RBC 3.19 (L) 4.22 - 5.81 MIL/uL   Hemoglobin 9.8 (L) 13.0 - 17.0 g/dL   HCT 29.0 (L) 39.0 - 52.0 %   MCV 90.9 80.0 - 100.0 fL   MCH 30.7 26.0 - 34.0 pg   MCHC 33.8 30.0 - 36.0 g/dL   RDW 13.0 11.5 - 15.5 %   Platelets 444 (H) 150 - 400 K/uL    Comment: REPEATED TO VERIFY SPECIMEN CHECKED FOR CLOTS    nRBC 0.0 0.0 - 0.2 %   Neutrophils Relative  % 82 %   Neutro Abs 10.2 (H) 1.7 - 7.7 K/uL   Lymphocytes Relative 5 %   Lymphs Abs 0.6 (L) 0.7 - 4.0 K/uL   Monocytes Relative 12 %   Monocytes Absolute 1.5 (H) 0.1 - 1.0 K/uL   Eosinophils Relative 0 %   Eosinophils Absolute 0.0 0.0 - 0.5 K/uL   Basophils Relative 0 %   Basophils Absolute 0.0 0.0 - 0.1 K/uL   Immature Granulocytes 1 %   Abs Immature Granulocytes 0.07 0.00 - 0.07 K/uL    Comment: Performed at [facility], [address], [place], [place] [address]  Comprehensive metabolic panel     Status: Abnormal   Collection Time: 02/09/19  3:29 PM  Result Value Ref Range   Sodium 138 135 - 145 mmol/L   Potassium 4.6 3.5 - 5.1 mmol/L   Chloride 97 (L) 98 - 111 mmol/L   CO2 21 (L) 22 - 32 mmol/L   Glucose, Bld 143 (H) 70 - 99 mg/dL   BUN 81 (H) 6 - 20 mg/dL   Creatinine, Ser 1.38 (H) 0.61 - 1.24 mg/dL   Calcium 9.4 8.9 - 10.3 mg/dL  Total Protein 8.1 6.5 - 8.1 g/dL   Albumin 4.1 3.5 - 5.0 g/dL   AST 54 (H) 15 - 41 U/L   ALT 46 (H) 0 - 44 U/L   Alkaline Phosphatase 68 38 - 126 U/L   Total Bilirubin 1.9 (H) 0.3 - 1.2 mg/dL   GFR calc non Af Amer 55 (L) >60 mL/min   GFR calc Af Amer >60 >60 mL/min   Anion gap 20 (H) 5 - 15    Comment: Performed at [facility] [address], [place], [place] [address]  CK     Status: Abnormal   Collection Time: 02/09/19  3:29 PM  Result Value Ref Range   Total CK 1,751 (H) 49 - 397 U/L    Comment: Performed at [facility], [place] [address], [place], [place] [address]  Troponin I - ONCE - STAT     Status: None   Collection Time: 02/09/19  3:29 PM  Result Value Ref Range   Troponin I <0.03 <0.03 ng/mL    Comment: Performed at [facility], [place] [address], [place], [place] [address]  Protime-INR     Status: None   Collection Time: 02/09/19  3:29 PM  Result Value Ref Range   Prothrombin Time 13.3 11.4 - 15.2 seconds   INR 1.0 0.8 - 1.2    Comment: (NOTE) INR goal varies based on device and disease  states. Performed at [facility], [place] [address] [place], [place] [address]   Lactic acid, plasma     Status: Abnormal   Collection Time: 02/09/19  3:29 PM  Result Value Ref Range   Lactic Acid, Venous 3.4 (HH) 0.5 - 1.9 mmol/L    Comment: CRITICAL RESULT CALLED TO, READ BACK BY AND VERIFIED WITH: Orson Ape [id number] 02/09/2019 D BRADLEY Performed at [facility], [place] [address] [place], [place] [address]   Lactic acid, plasma     Status: Abnormal   Collection Time: 02/09/19  5:14 PM  Result Value Ref Range   Lactic Acid, Venous 2.0 (HH) 0.5 - 1.9 mmol/L    Comment: CRITICAL RESULT CALLED TO, READ BACK BY AND VERIFIED WITH: Orson Ape [id number] 02/09/2019 D BRADLEY Performed at [facility] [address] [place], [place] [address]   Urinalysis, Routine w reflex microscopic     Status: Abnormal   Collection Time: 02/09/19  5:40 PM  Result Value Ref Range   Color, Urine YELLOW YELLOW   APPearance CLEAR CLEAR   Specific Gravity, Urine 1.023 1.005 - 1.030   pH 5.0 5.0 - 8.0   Glucose, UA NEGATIVE NEGATIVE mg/dL   Hgb urine dipstick MODERATE (A) NEGATIVE   Bilirubin Urine NEGATIVE NEGATIVE   Ketones, ur 80 (A) NEGATIVE mg/dL   Protein, ur NEGATIVE NEGATIVE mg/dL   Nitrite NEGATIVE NEGATIVE   Leukocytes,Ua NEGATIVE NEGATIVE   RBC / HPF 0-5 0 - 5 RBC/hpf   WBC, UA 0-5 0 - 5 WBC/hpf   Bacteria, UA NONE SEEN NONE SEEN   Squamous Epithelial / LPF 0-5 0 - 5   Mucus PRESENT     Comment: Performed at [facility], [place] [address] [place], [place] [address]  CSF cell count with differential collection tube #: 1     Status: Abnormal   Collection Time: 02/09/19 10:45 PM  Result Value Ref Range   Tube # 1    Color, CSF YELLOW (A) COLORLESS   Appearance, CSF  CLEAR CLEAR   Supernatant XANTHOCHROMIC    RBC Count, CSF 200 (H) 0 /cu mm   WBC, CSF 355 (HH) 0 - 5 /cu mm    Comment: REPEATED TO VERIFY SPECIMEN CHECKED FOR CLOTS CRITICAL RESULT CALLED TO, READ BACK BY AND  VERIFIED WITHDarrel Hoover RN [phone] SHORTT     Lymphs, CSF 95 (H) 40 - 80 %   Monocyte-Macrophage-Spinal Fluid 5 (L) 15 - 45 %    Comment: Performed at [facility] [address], [place], [place] [address]  CSF cell count with differential collection tube #: 4     Status: Abnormal   Collection Time: 02/09/19 10:45 PM  Result Value Ref Range   Tube # 4    Color, CSF YELLOW (A) COLORLESS   Appearance, CSF CLEAR CLEAR   Supernatant XANTHOCHROMIC    RBC Count, CSF 270 (H) 0 /cu mm   WBC, CSF 335 (HH) 0 - 5 /cu mm    Comment: REPEATED TO VERIFY SPECIMEN CHECKED FOR CLOTS CRITICAL RESULT CALLED TO, READ BACK BY AND VERIFIED WITHDarrel Hoover RN [phone] SHORTT     Segmented Neutrophils-CSF 1 0 - 6 %   Lymphs, CSF 93 (H) 40 - 80 %   Monocyte-Macrophage-Spinal Fluid 6 (L) 15 - 45 %    Comment: Performed at [facility], [place] [address] [place], [place] [address]  CSF culture     Status: None (Preliminary result)   Collection Time: 02/09/19 10:45 PM  Result Value Ref Range   Specimen Description CSF    Special Requests Normal    Gram Stain      WBC PRESENT, PREDOMINANTLY MONONUCLEAR NO ORGANISMS SEEN CYTOSPIN SMEAR Performed at [facility], [place] [address], [place], [place] [address]    Culture PENDING    Report Status PENDING   Protein and glucose, CSF     Status: Abnormal   Collection Time: 02/09/19 10:45 PM  Result Value Ref Range   Glucose, CSF 60 40 - 70 mg/dL   Total  Protein, CSF 470 (H) 15 - 45 mg/dL    Comment: RESULTS CONFIRMED BY MANUAL DILUTION Performed at [facility] [address] [place], [place] [address]   TSH     Status: Abnormal   Collection Time: 02/10/19  4:51 AM  Result Value Ref Range   TSH 0.027 (L) 0.350 - 4.500 uIU/mL    Comment: Performed by a 3rd Generation assay with a functional sensitivity of <=0.01 uIU/mL. Performed at [facility], [place] [address] [place], [place] [address]   Basic metabolic panel      Status: Abnormal   Collection Time: 02/10/19  4:51 AM  Result Value Ref Range   Sodium 145 135 - 145 mmol/L    Comment: DELTA CHECK NOTED   Potassium 4.0 3.5 - 5.1 mmol/L   Chloride 110 98 - 111 mmol/L   CO2 21 (L) 22 - 32 mmol/L   Glucose, Bld 125 (H) 70 - 99 mg/dL   BUN 46 (H) 6 - 20 mg/dL   Creatinine, Ser 0.95 0.61 - 1.24 mg/dL   Calcium 8.4 (L) 8.9 - 10.3 mg/dL   GFR calc non Af Amer >60 >60 mL/min   GFR calc Af Amer >60 >60 mL/min   Anion gap 14 5 - 15    Comment: Performed at [facility], [place] [address], [place],  [address]  CBC  Status: Abnormal   Collection Time: 02/10/19  4:51 AM  Result Value Ref Range   WBC 8.6 4.0 - 10.5 K/uL   RBC 2.57 (L) 4.22 - 5.81 MIL/uL   Hemoglobin 7.7 (L) 13.0 - 17.0 g/dL   HCT 23.4 (L) 39.0 - 52.0 %   MCV 91.1 80.0 - 100.0 fL   MCH 30.0 26.0 - 34.0 pg   MCHC 32.9 30.0 - 36.0 g/dL   RDW 13.3 11.5 - 15.5 %   Platelets 392 150 - 400 K/uL   nRBC 0.3 (H) 0.0 - 0.2 %    Comment: Performed at [facility], [place] [address] [place], [place] [address]  CK     Status: Abnormal   Collection Time: 02/10/19  4:51 AM  Result Value Ref Range   Total CK 1,988 (H) 49 - 397 U/L    Comment: Performed at [facility], [place] [address] [place], [place] [address]    Ct Abdomen Pelvis Wo Contrast  Result Date: 02/09/2019 CLINICAL DATA:  Fall. Found on floor. Left flank hematoma. EXAM: CT ABDOMEN AND PELVIS WITHOUT CONTRAST TECHNIQUE: Multidetector CT imaging of the abdomen and pelvis was performed following the standard protocol without IV contrast. COMPARISON:  12/04/2018 FINDINGS: Lower chest: Motion artifact through the lung bases. No lung consolidation or pleural effusion. Hepatobiliary: Subcentimeter low-density liver lesions on the prior study are partially obscured by artifact from the patient's arms today. Small gallstones are again seen. No biliary dilatation is evident. Pancreas: Unremarkable. Spleen: Unremarkable.  Adrenals/Urinary Tract: Unremarkable adrenal glands. Unchanged 1.9 cm low-density right lower pole renal lesion compatible with a cyst on the prior study. No renal calculi or hydronephrosis. Apparent mild bladder wall thickening is attributed to underdistention. Stomach/Bowel: The stomach is unremarkable. A large amount of stool is present in the rectum which is distended to 8 cm in diameter. There is a moderate amount of stool in the sigmoid colon. There is no evidence of bowel obstruction. The appendix is unremarkable. Vascular/Lymphatic: Abdominal aortic atherosclerosis without aneurysm. No enlarged lymph nodes. Reproductive: Unremarkable prostate. Other: No ascites or pneumoperitoneum. No retroperitoneal hematoma. Mild stranding in the left lateral flank subcutaneous fat consistent with contusion. Musculoskeletal: No acute osseous abnormality or suspicious osseous lesion. IMPRESSION: 1. Mild left flank superficial soft tissue contusion. 2. No other evidence of acute traumatic injury in the abdomen or pelvis. 3. Large amount of stool in the rectum.  No bowel obstruction. 4. Cholelithiasis. 5.  Aortic Atherosclerosis (ICD10-I70.0). Electronically Signed   By: Logan Bores M.D.   On: 02/09/2019 17:11   Dg Chest 1 View  Result Date: 02/09/2019 CLINICAL DATA:  Found on floor today with left upper extremity bruising. EXAM: CHEST  1 VIEW COMPARISON:  None. FINDINGS: Lungs are adequately inflated and otherwise clear. Cardiomediastinal silhouette is normal. Fusion hardware intact over the cervicothoracic region. Moderately displaced slightly comminuted left humeral neck fracture. IMPRESSION: No acute cardiopulmonary disease. Moderately displaced slightly comminuted left humeral neck fracture. Electronically Signed   By: Marin Olp M.D.   On: 02/09/2019 16:14   Dg Elbow Complete Left  Result Date: 02/09/2019 CLINICAL DATA:  Found on floor with left arm pain. EXAM: LEFT ELBOW - COMPLETE 3+ VIEW COMPARISON:   None. FINDINGS: There is no evidence of fracture, dislocation, or joint effusion. There is no evidence of arthropathy or other focal bone abnormality. Soft tissues are unremarkable. IMPRESSION: Negative. Electronically Signed   By: Marin Olp M.D.   On: 02/09/2019 16:14  Ct Head Wo Contrast  Result Date: 02/09/2019 CLINICAL DATA:  Found on floor with multiple bruises. EXAM: CT HEAD WITHOUT CONTRAST CT CERVICAL SPINE WITHOUT CONTRAST TECHNIQUE: Multidetector CT imaging of the head and cervical spine was performed following the standard protocol without intravenous contrast. Multiplanar CT image reconstructions of the cervical spine were also generated. COMPARISON:  Head and cervical spine CT 05/18/2017. Brain MRI 05/04/2015. FINDINGS: CT HEAD FINDINGS Brain: There is edema anteriorly and medially in the right temporal lobe including diffuse hippocampal edema with regional sulcal effacement. There is no midline shift. Trace hyperdense blood products are present in the occipital horns of both lateral ventricles without convincing subarachnoid hemorrhage seen elsewhere. No extra-axial fluid collection is identified. There is moderate cerebellar atrophy. Vascular: Calcified atherosclerosis at the skull base. No hyperdense vessel. Skull: No fracture or focal osseous lesion. Sinuses/Orbits: Clear paranasal sinuses. Chronic appearing left mastoid effusion. Bilateral cataract extraction. Other: Minimal frontal scalp swelling. CT CERVICAL SPINE FINDINGS Alignment: Normal. Skull base and vertebrae: Prominent median C1-2 arthropathy. Solid C4-C6 ACDF. Solid instrumented posterior fusion C2-T2. Posterior decompression C3-C7. Chronic extension of the left C2 screw into the lateral aspect of the spinal canal. No acute fracture or destructive osseous process. Soft tissues and spinal canal: No prevertebral fluid or swelling. No visible canal hematoma. Disc levels: Posterior decompression and fusion changes as above.  Mild-to-moderate osseous neural foraminal stenosis on the right at C5-6. Upper chest: Clear lung apices. Other: None. IMPRESSION: 1. Edema throughout the mesial right temporal lobe. Considerations include acute to subacute infarct, infection, and tumor. This is more extensive than is typically seen with traumatic temporal lobe contusion. Brain MRI (preferably without and with contrast) is recommended for further evaluation. 2. Trace hemorrhage in both lateral ventricles. 3. No acute cervical spine fracture or subluxation. Critical Value/emergent results were called by telephone at the time of interpretation on 02/09/2019 at 4:15 pm to Dr. Duffy Bruce, who verbally acknowledged these results. Electronically Signed   By: Logan Bores M.D.   On: 02/09/2019 16:20   Ct Cervical Spine Wo Contrast  Result Date: 02/09/2019 CLINICAL DATA:  Found on floor with multiple bruises. EXAM: CT HEAD WITHOUT CONTRAST CT CERVICAL SPINE WITHOUT CONTRAST TECHNIQUE: Multidetector CT imaging of the head and cervical spine was performed following the standard protocol without intravenous contrast. Multiplanar CT image reconstructions of the cervical spine were also generated. COMPARISON:  Head and cervical spine CT 05/18/2017. Brain MRI 05/04/2015. FINDINGS: CT HEAD FINDINGS Brain: There is edema anteriorly and medially in the right temporal lobe including diffuse hippocampal edema with regional sulcal effacement. There is no midline shift. Trace hyperdense blood products are present in the occipital horns of both lateral ventricles without convincing subarachnoid hemorrhage seen elsewhere. No extra-axial fluid collection is identified. There is moderate cerebellar atrophy. Vascular: Calcified atherosclerosis at the skull base. No hyperdense vessel. Skull: No fracture or focal osseous lesion. Sinuses/Orbits: Clear paranasal sinuses. Chronic appearing left mastoid effusion. Bilateral cataract extraction. Other: Minimal frontal scalp  swelling. CT CERVICAL SPINE FINDINGS Alignment: Normal. Skull base and vertebrae: Prominent median C1-2 arthropathy. Solid C4-C6 ACDF. Solid instrumented posterior fusion C2-T2. Posterior decompression C3-C7. Chronic extension of the left C2 screw into the lateral aspect of the spinal canal. No acute fracture or destructive osseous process. Soft tissues and spinal canal: No prevertebral fluid or swelling. No visible canal hematoma. Disc levels: Posterior decompression and fusion changes as above. Mild-to-moderate osseous neural foraminal stenosis on the right at C5-6. Upper chest: Clear lung apices. Other:  None. IMPRESSION: 1. Edema throughout the mesial right temporal lobe. Considerations include acute to subacute infarct, infection, and tumor. This is more extensive than is typically seen with traumatic temporal lobe contusion. Brain MRI (preferably without and with contrast) is recommended for further evaluation. 2. Trace hemorrhage in both lateral ventricles. 3. No acute cervical spine fracture or subluxation. Critical Value/emergent results were called by telephone at the time of interpretation on 02/09/2019 at 4:15 pm to Dr. Duffy Bruce, who verbally acknowledged these results. Electronically Signed   By: Logan Bores M.D.   On: 02/09/2019 16:20   Mr Brain W And Wo Contrast  Result Date: 02/09/2019 CLINICAL DATA:  Recent fall.  Possible intracranial hemorrhage. EXAM: MRI HEAD WITHOUT AND WITH CONTRAST TECHNIQUE: Multiplanar, multiecho pulse sequences of the brain and surrounding structures were obtained without and with intravenous contrast. CONTRAST:  7 mL Gadavist COMPARISON:  Head CT 02/09/2019 FINDINGS: BRAIN: There is abnormal diffusion restriction within the mesial right temporal lobe. There is a small amount of blood dependently within both lateral ventricles. The midline structures are normal. Moderate medial right temporal lobe edema. Minimal white matter hyperintensity, nonspecific and commonly  seen in asymptomatic patients of this age. The cerebral and cerebellar volume are age-appropriate. No hydrocephalus. Susceptibility-sensitive sequences show no chronic microhemorrhage or superficial siderosis. No abnormal contrast enhancement. VASCULAR: The major intracranial arterial and venous sinus flow voids are normal. SKULL AND UPPER CERVICAL SPINE: Calvarial bone marrow signal is normal. There is no skull base mass. Visualized upper cervical spine and soft tissues are normal. SINUSES/ORBITS: No fluid levels or advanced mucosal thickening. No mastoid or middle ear effusion. The orbits are normal. IMPRESSION: 1. Abnormal diffusion restriction within the mesial right temporal lobe, most consistent with acute or early subacute infarct. This distribution may also be seen in infection, particularly herpes encephalitis, though this seems less likely in this presentation, particularly if the patient is afebrile. 2. Trace intraventricular blood products within the occipital horns of both lateral ventricles. Electronically Signed   By: Ulyses Jarred M.D.   On: 02/09/2019 20:34   Dg Humerus Left  Result Date: 02/09/2019 CLINICAL DATA:  Found on floor with left arm pain. EXAM: LEFT HUMERUS - 2+ VIEW COMPARISON:  None. FINDINGS: Exam demonstrates a moderately displaced left humeral neck fracture with mild comminution and extending into the humeral head with mild comminution of the humeral head. There is a fracture fragment adjacent the medial aspect of the humeral neck likely from the humeral fracture, although cannot completely exclude an adjacent glenoid fracture. Remainder the exam is unremarkable. IMPRESSION: Moderately displaced mildly comminuted left humeral neck fracture with extension into the humeral head with mild comminution of the humeral head. It would be difficult to exclude an adjacent glenoid fracture. Electronically Signed   By: Marin Olp M.D.   On: 02/09/2019 16:18    Review of Systems   Unable to perform ROS: Mental status change   Blood pressure (!) 144/72, pulse (!) 139, temperature (!) 103.2 F (39.6 C), temperature source Oral, resp. rate (!) 24, weight 65 kg, SpO2 98 %. Physical Exam  Constitutional: He appears well-developed and well-nourished. No distress.  HENT:  Head: Normocephalic and atraumatic.  Eyes: Conjunctivae are normal. Right eye exhibits no discharge. Left eye exhibits no discharge. No scleral icterus.  Cardiovascular: Normal rate and regular rhythm.  Respiratory: Effort normal. No respiratory distress.  Musculoskeletal:     Comments: Left shoulder, elbow, wrist, digits- no skin wounds, shoulder ecchymotic, swollen, apparently  TTP (  pt wincing), in sling  Sens  Ax/R/M/U could not assess  Mot   Ax/ R/ PIN/ M/ AIN/ U could not assess  Rad 2+  Neurological: He is alert.  Skin: Skin is warm and dry. He is not diaphoretic.  Psychiatric: He has a normal mood and affect. His behavior is normal.    Assessment/Plan: Left humerus fx -- May be a candidate for reverse shoulder arthroplasty once his mental status returns to baseline. If that happens while he is still an inpatient please let us know. If he ends up discharging to SNF before that happens he may follow up with Dr. Griffin Basil as an outpatient. Sling and NWB LUE for now. AMS Rhabdomyolysis Dysarthria Cervical dystonia    Lisette Abu, PA-C Orthopedic Surgery [phone] 02/10/2019, 8:57 AM

## 2019-02-10 NOTE — Progress Notes (Signed)
EEG complete - results pending 

## 2019-02-10 NOTE — Procedures (Signed)
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 One channel was dedicated to EKG recording.    Background: There is a posterior dominant rhythm of 8 to 9 Hz which is seen bilaterally.  In addition, there is generalized irregular delta and theta activities intruding into the background even during the maximal waking state.  At times the delta activity actually looks more prominent over the right hemisphere compared to the left.  There are runs of frontally predominant intermittent rhythmic delta activity (FIRDA) that appear to be associated with drowsiness.  There is no evolution or other concerning features to this pattern.  No epileptiform activity was seen.  Photic stimulation: Physiologic driving is not performed  EEG Abnormalities: 1) FIRDA 2) generalized irregular slow activity  Clinical Interpretation: This EEG is consistent with a mild generalized nonspecific cerebral dysfunction (encephalopathy) with suggestion of more dysfunction on the right than left. There was no seizure or seizure predisposition recorded on this study. Please note that lack of epileptiform activity on EEG does not preclude the possibility of epilepsy.   Roland Rack, MD [facility] [phone]  If 7pm- 7am, please page neurology on call as listed in [place].

## 2019-02-10 NOTE — Progress Notes (Signed)
PHARMACY - PHYSICIAN COMMUNICATION CRITICAL VALUE ALERT - BLOOD CULTURE IDENTIFICATION (BCID)  Patrick Daniel is an 63 y.o. male who presented to [facility] on 02/09/2019 with a chief complaint of AMS after being found down. MRI with concern for potential infection, s/p LP suggestive of HSV encephalitis. Admit BCx now 2/4 (1 of 2 sets) growing GPC, BCID CoNS.  Name of physician (or Provider) Contacted: FMTS  Current antibiotics: Vanc, cefepime, acyclovir  Changes to prescribed antibiotics recommended:  None - likely contaminant, continue current antibiotics   Results for orders placed or performed during the hospital encounter of 02/09/19  Blood Culture ID Panel (Reflexed) (Collected: 02/09/2019  5:08 PM)  Result Value Ref Range   Enterococcus species NOT DETECTED NOT DETECTED   Listeria monocytogenes NOT DETECTED NOT DETECTED   Staphylococcus species DETECTED (A) NOT DETECTED   Staphylococcus aureus (BCID) NOT DETECTED NOT DETECTED   Methicillin resistance DETECTED (A) NOT DETECTED   Streptococcus species NOT DETECTED NOT DETECTED   Streptococcus agalactiae NOT DETECTED NOT DETECTED   Streptococcus pneumoniae NOT DETECTED NOT DETECTED   Streptococcus pyogenes NOT DETECTED NOT DETECTED   Acinetobacter baumannii NOT DETECTED NOT DETECTED   Enterobacteriaceae species NOT DETECTED NOT DETECTED   Enterobacter cloacae complex NOT DETECTED NOT DETECTED   Escherichia coli NOT DETECTED NOT DETECTED   Klebsiella oxytoca NOT DETECTED NOT DETECTED   Klebsiella pneumoniae NOT DETECTED NOT DETECTED   Proteus species NOT DETECTED NOT DETECTED   Serratia marcescens NOT DETECTED NOT DETECTED   Haemophilus influenzae NOT DETECTED NOT DETECTED   Neisseria meningitidis NOT DETECTED NOT DETECTED   Pseudomonas aeruginosa NOT DETECTED NOT DETECTED   Candida albicans NOT DETECTED NOT DETECTED   Candida glabrata NOT DETECTED NOT DETECTED   Candida krusei NOT DETECTED NOT DETECTED   Candida parapsilosis  NOT DETECTED NOT DETECTED   Candida tropicalis NOT DETECTED NOT DETECTED   Onica Davidovich N. Gerarda Fraction, PharmD, [place] PGY2 Infectious Diseases Pharmacy Resident Phone: [phone] 02/10/2019  2:55 PM

## 2019-02-10 NOTE — Evaluation (Signed)
Speech Language Pathology Evaluation Patient Details Name: Patrick Daniel MRN: [id number] DOB: 1955/12/28 Today's Date: 02/10/2019 Time: 4098-1191 SLP Time Calculation (min) (ACUTE ONLY): 15 min  Problem List:  Patient Active Problem List   Diagnosis Date Noted  . AKI (acute kidney injury) ([place])   . Dehydration   . Encephalitis   . Fall   . Traumatic rhabdomyolysis ([place])   . Unspecified fracture of upper end of left humerus, initial encounter for closed fracture   . Altered mental status 02/09/2019  . Paresthesia 08/07/2016  . Chronic migraine w/o aura w/o status migrainosus, not intractable 06/03/2016  . H/O arthrodesis 05/17/2014  . Paraplegia ([place])   . Gait disturbance   . Dysarthria   . Cervical dystonia   . Myelopathy [facility])    Past Medical History:  Past Medical History:  Diagnosis Date  . Dysarthria   . Dystonia   . Gait disturbance   . Myelopathy ([place])   . Paraplegia [facility])    Past Surgical History:  Past Surgical History:  Procedure Laterality Date  . NECK SURGERY     dec 2014   HPI:  Patrick Daniel is a 63 y.o. male presenting with history of paraplegia, myelopathy, dysarthria, cervical dystonia, (?CNS degenerative disorder )ad[facility] from [facility] with AMS after being found down. 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 This distribution may also be seen in infection, particularly herpes encephalitis, though this seems less likely in this presentation. Sustained  aleft humerus fx, rhabdomyolsis. 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   Assessment / Plan / Recommendation Clinical Impression  Severe dysarthria with suspected difficulty motor planning at baseline. Vocal intensity is low with decreased resonance and prosody, increased rate and decreased lingual/labial ROM. Oral care provided before swallow assessment improving  articulatory ROM and speech intelligibilty (mildly). He is oriented to place, not month and required reminders for situation. Sustained attention to SLP was fair. Attemtped to contact sister, with pt's permission to discuss speech and cognitive baseline without success. ST will continue treatment for dysarthria and cogntition.       SLP Assessment  SLP Recommendation/Assessment: Patient needs continued Speech Lanaguage Pathology Services SLP Visit Diagnosis: Dysphagia, unspecified (R13.10)    Follow Up Recommendations  Skilled Nursing facility    Frequency and Duration min 2x/week  2 weeks      SLP Evaluation Cognition  Overall Cognitive Status: Difficult to assess Arousal/Alertness: Awake/alert Orientation Level: Oriented to place Attention: Sustained Sustained Attention: Appears intact Memory: (TBA) Awareness: (TBA) Problem Solving: (continue to assess) Safety/Judgment: Impaired       Comprehension  Auditory Comprehension Overall Auditory Comprehension: Appears within functional limits for tasks assessed Visual Recognition/Discrimination Discrimination: Not tested Reading Comprehension Reading Status: Not tested    Expression Expression Primary Mode of Expression: Verbal Verbal Expression Overall Verbal Expression: Appears within functional limits for tasks assessed Level of Generative/Spontaneous Verbalization: Sentence Pragmatics: Impairment Impairments: Eye contact;Dysprosody Written Expression Dominant Hand: Right Written Expression: Not tested   Oral / Motor  Oral Motor/Sensory Function Overall Oral Motor/Sensory Function: Other (comment)(decreased coordination) Motor Speech Respiration: Impaired Level of Impairment: Sentence Phonation: Low vocal intensity Resonance: (? hypernasality) Articulation: Within functional limitis Intelligibility: Intelligibility reduced Word: 50-74% accurate Phrase: 25-49% accurate Sentence: 25-49% accurate Conversation:  0-24% accurate Motor Planning: (?)   GO  Houston Siren 02/10/2019, 4:09 PM [name] Colvin Caroli.Ed Risk analyst [phone] Office [phone]

## 2019-02-10 NOTE — Progress Notes (Signed)
Orthopedic Tech Progress Note Patient Details:  Patrick Daniel Sep 25, 1956 [id number]  Ortho Devices Type of Ortho Device: Arm sling Ortho Device/Splint Location: ULE Ortho Device/Splint Interventions: Adjustment, Application, Ordered   Post Interventions Patient Tolerated: Well Instructions Provided: Adjustment of device   Janit Pagan 02/10/2019, 8:40 AM

## 2019-02-10 NOTE — Progress Notes (Signed)
Physical Therapy Treatment Patient Details Name: Patrick Daniel MRN: [id number] DOB: [birth year] Today's Date: 02/10/2019    History of Present Illness 63 y.o. male presenting with AMS after being found down at [facility]. PMH is significant for cervical dystonia, dysarthria, HLD, gait abnormality (?CNS degenerative disorder?). Patient unable to provide insight to what caused fall given chronic speech issues that have acutely worsened. Known mobility issues at baseline with frequent falls. Concern for traumatic head injury after fall vs stroke vs seizure. Head CT with temporal lobe edema. MRI with concern for acute or early subacute infarct vs infection like HSV. Pt also found to have comminuted fx of L humeral neck, ortho following.     PT Comments    Pt admitted with above diagnosis. Pt currently with functional limitations due to the deficits listed below (see PT Problem List). At the time of PT eval pt was able to perform transfer to/from EOB with up to +2 max assist. Pt required multimodal cues for NWB status on the LUE. Pt fatigued quickly with EOB activity, and we were not able to progress OOB at this time. Recommending SNF for continued therapy services at this time. Acutely, pt will benefit from skilled PT to increase their independence and safety with mobility to allow discharge to the venue listed below.       Follow Up Recommendations  SNF;Supervision/Assistance - 24 hour     Equipment Recommendations  Other (comment)(TBD by next venue of care)    Recommendations for Other Services       Precautions / Restrictions Precautions Precautions: Fall;Shoulder;Other (comment) Type of Shoulder Precautions: sling, NWB  Shoulder Interventions: Shoulder sling/immobilizer Precaution Comments: watch HR Required Braces or Orthoses: Sling Restrictions Weight Bearing Restrictions: Yes LUE Weight Bearing: Non weight bearing    Mobility  Bed Mobility Overal bed mobility: Needs Assistance Bed  Mobility: Supine to Sit;Sit to Supine     Supine to sit: Max assist;+2 for physical assistance;+2 for safety/equipment Sit to supine: Max assist;+2 for physical assistance;+2 for safety/equipment   General bed mobility comments: pt able to follow some commands to assist (minimally) with transitions; assist for LEs over EOB and to elevate trunk, sitting up towards R side, pt requiring assist to stabilize and maintain sitting balance  Transfers                 General transfer comment: deferred today  Ambulation/Gait                 Stairs             Wheelchair Mobility    Modified Rankin (Stroke Patients Only)       Balance Overall balance assessment: Needs assistance Sitting-balance support: Feet supported Sitting balance-Leahy Scale: Poor Sitting balance - Comments: requires overall modA for sitting balance                                    Cognition Arousal/Alertness: Awake/alert;Lethargic(sleepy end of session due to fatigue) Behavior During Therapy: WFL for tasks assessed/performed;Flat affect Overall Cognitive Status: Impaired/Different from baseline Area of Impairment: Attention;Following commands;Safety/judgement;Awareness                   Current Attention Level: Focused   Following Commands: Follows one step commands inconsistently;Follows one step commands with increased time Safety/Judgement: Decreased awareness of deficits Awareness: Intellectual   General Comments: pt able to answer most of therapists questions during  session and appears to answer appropriately, intermittently requires cues and repetition to follow commands; mumbled speech which can be unintelligible at times; requires cues/assist not to move LUE       Exercises      General Comments General comments (skin integrity, edema, etc.): Pt HR 131-135 throughout session completion; pt on RA with SpO2 95-96% on RA      Pertinent Vitals/Pain Pain  Assessment: Faces Faces Pain Scale: No hurt Pain Intervention(s): Monitored during session    Home Living Family/patient expects to be discharged to:: Other (Comment)(ILF)               Additional Comments: from ILF    Prior Function Level of Independence: Needs assistance  Gait / Transfers Assistance Needed: pt reports using RW for mobility, per chart pt with history of falls ADL's / Homemaking Assistance Needed: pt reports he has assist for ADL, though per chart review pt may have been performing without assist; sister accompanies pt to grocery store and doctors appointments     PT Goals (current goals can now be found in the care plan section) Acute Rehab PT Goals Patient Stated Goal: none stated today, agreeable to working  with therapies PT Goal Formulation: Patient unable to participate in goal setting Time For Goal Achievement: 02/24/19 Potential to Achieve Goals: Good    Frequency    Min 3X/week      PT Plan      Co-evaluation PT/OT/SLP Co-Evaluation/Treatment: Yes Reason for Co-Treatment: Complexity of the patient's impairments (multi-system involvement);Necessary to address cognition/behavior during functional activity;For patient/therapist safety;To address functional/ADL transfers PT goals addressed during session: Mobility/safety with mobility;Balance OT goals addressed during session: ADL's and self-care      AM-PAC PT "6 Clicks" Mobility   Outcome Measure  Help needed turning from your back to your side while in a flat bed without using bedrails?: A Lot Help needed moving from lying on your back to sitting on the side of a flat bed without using bedrails?: A Lot Help needed moving to and from a bed to a chair (including a wheelchair)?: Total Help needed standing up from a chair using your arms (e.g., wheelchair or bedside chair)?: Total Help needed to walk in hospital room?: Total Help needed climbing 3-5 steps with a railing? : Total 6 Click Score:  8    End of Session Equipment Utilized During Treatment: Oxygen Activity Tolerance: Patient limited by fatigue Patient left: in bed;with call bell/phone within reach;with bed alarm set Nurse Communication: Mobility status PT Visit Diagnosis: Muscle weakness (generalized) (M62.81);History of falling (Z91.81);Difficulty in walking, not elsewhere classified (R26.2);Pain Pain - Right/Left: Left Pain - part of body: Shoulder     Time: 6546-5035 PT Time Calculation (min) (ACUTE ONLY): 20 min  Charges:                        Rolinda Roan, PT, DPT Acute Rehabilitation Services Pager: [phone] Office: [phone]    Thelma Comp 02/10/2019, 1:54 PM

## 2019-02-10 NOTE — Evaluation (Signed)
Clinical/Bedside Swallow Evaluation Patient Details  Name: Patrick Daniel MRN: [id number] Date of Birth: Dec 20, 1955  Today's Date: 02/10/2019 Time: SLP Start Time (ACUTE ONLY): 9211 SLP Stop Time (ACUTE ONLY): 1448 SLP Time Calculation (min) (ACUTE ONLY): 15 min  Past Medical History:  Past Medical History:  Diagnosis Date  . Dysarthria   . Dystonia   . Gait disturbance   . Myelopathy ([place])   . Paraplegia [facility])    Past Surgical History:  Past Surgical History:  Procedure Laterality Date  . NECK SURGERY     dec 2014   HPI:  Patrick Daniel is a 63 y.o. male presenting with history of paraplegia, myelopathy, dysarthria, cervical dystonia, (?CNS degenerative disorder )admitted from [facility] living facility with AMS after being found down. 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 This distribution may also be seen in infection, particularly herpes encephalitis, though this seems less likely in this presentation. Sustained  aleft humerus fx, rhabdomyolsis. 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   Assessment / Plan / Recommendation Clinical Impression  Currently pt exhibits severe risk of aspiration. Significant candidias with sloughing of top layer of tongue and diffuse white areas. Increased work of breathing at rest exhibiting cough however not productive. Oral care performed majority of evaluation with ice assisting to loosen phlegm suspected in pharynx. Swallow initiation is very effortful with multiple attempts before appearance of achieving full swallow followed by multiple swallows with ice chips then cup sip. Highly suspect inadequate mobility and tracheal protection with need for instrumental assessment. ST will continue treatment.          SLP Visit Diagnosis: Dysphagia, unspecified (R13.10)    Aspiration Risk  Severe aspiration risk;Risk for inadequate  nutrition/hydration    Diet Recommendation NPO   Medication Administration: Via alternative means    Other  Recommendations Oral Care Recommendations: Oral care QID   Follow up Recommendations Skilled Nursing facility      Frequency and Duration min 2x/week  2 weeks       Prognosis Prognosis for Safe Diet Advancement: Fair Barriers to Reach Goals: Severity of deficits      Swallow Study   General HPI: Patrick Daniel is a 63 y.o. male presenting with history of paraplegia, myelopathy, dysarthria, cervical dystonia, (?CNS degenerative disorder )admitted from indpendendent living facility with AMS after being found down. 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 This distribution may also be seen in infection, particularly herpes encephalitis, though this seems less likely in this presentation. Sustained  aleft humerus fx, rhabdomyolsis. 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 Type of Study: Bedside Swallow Evaluation Previous Swallow Assessment: (none found) Diet Prior to this Study: NPO Temperature Spikes Noted: (101.1) Respiratory Status: Nasal cannula History of Recent Intubation: No Behavior/Cognition: Alert;Distractible;Requires cueing;Cooperative Oral Cavity Assessment: Excessive secretions;Other (comment)(top layer tongue sloughing off, copious secretions) Oral Care Completed by SLP: Yes Oral Cavity - Dentition: Missing dentition Vision: (?) Self-Feeding Abilities: Total assist Patient Positioning: Upright in bed Baseline Vocal Quality: Low vocal intensity Volitional Cough: Strong Volitional Swallow: Unable to elicit    Oral/Motor/Sensory Function Overall Oral Motor/Sensory Function: (discoordination)   Ice Chips Ice chips: Impaired Presentation: Spoon Oral Phase Impairments: Reduced labial seal;Reduced lingual movement/coordination Oral Phase  Functional Implications: Prolonged oral transit;Right lateral sulci pocketing;Left lateral sulci pocketing Pharyngeal Phase Impairments: Other (comments);Throat  Clearing - Delayed;Decreased hyoid-laryngeal movement(effortful swallow)   Thin Liquid Thin Liquid: Impaired Presentation: Cup Oral Phase Impairments: Reduced labial seal;Reduced lingual movement/coordination Oral Phase Functional Implications: Right anterior spillage;Left anterior spillage Pharyngeal  Phase Impairments: Change in Vital Signs;Other (comments);Decreased hyoid-laryngeal movement(poor respiratory/swallow coordination, effortful)    Nectar Thick Nectar Thick Liquid: Not tested   Honey Thick Honey Thick Liquid: Not tested   Puree Puree: Not tested   Solid     Solid: Not tested      Patrick Daniel 02/10/2019,3:53 PM  Patrick Daniel.Ed Risk analyst [phone] Office [phone]

## 2019-02-10 NOTE — TOC Initial Note (Signed)
Transition of Care [facility]) - Initial/Assessment Note    Patient Details  Name: Patrick Daniel MRN: [id number] Date of Birth: 12/04/1955  [facility] CM/SW Contact:    Geralynn Ochs, LCSW Phone Number: 02/10/2019, 11:54 AM  Clinical Narrative:  Patient from independent living, [facility]. Sister assists with transportation and groceries as patient doesn't drive, visits patient weekly. Anticipated potential SNF placement at discharge, sister is agreeable and would prefer [facility] in [place].                 Expected Discharge Plan: Skilled Nursing Facility Barriers to Discharge: Continued Medical Work up   Patient Goals and CMS Choice Patient states their goals for this hospitalization and ongoing recovery are:: patient unable to participate in goal setting; sister wants the patient to recover and feel better      Expected Discharge Plan and Services Expected Discharge Plan: [place] arrangements for the past 2 months: [place]                          Prior Living Arrangements/Services Living arrangements for the past 2 months: [place] Lives with:: Self Patient language and need for interpreter reviewed:: No Do you feel safe going back to the place where you live?: Yes      Need for Family Participation in Patient Care: Yes (Comment) Care giver support system in place?: No (comment) Current home services: DME Criminal Activity/Legal Involvement Pertinent to Current Situation/Hospitalization: No - Comment as needed  Activities of Daily Living      Permission Sought/Granted Permission sought to share information with : Family Supports Permission granted to share information with : Yes, Verbal Permission Granted  Share Information with NAME: Roselee     Permission granted to share info w Relationship: Sister     Emotional Assessment Appearance:: Appears stated  age Attitude/Demeanor/Rapport: Unable to Assess Affect (typically observed): Unable to Assess Orientation: : Oriented to Self Alcohol / Substance Use: Not Applicable Psych Involvement: No (comment)  Admission diagnosis:  Fall [W19.XXXA] Fall, initial encounter [id number]] Patient Active Problem List   Diagnosis Date Noted  . Altered mental status 02/09/2019  . Paresthesia 08/07/2016  . Chronic migraine w/o aura w/o status migrainosus, not intractable 06/03/2016  . H/O arthrodesis 05/17/2014  . Paraplegia ([place])   . Gait disturbance   . Dysarthria   . Cervical dystonia   . Myelopathy ([place])    PCP:  Marco Collie, MD Pharmacy:   [facility] [place], [address] [name] [address] Phone: [phone] Fax: [phone]     Social Determinants of Health (SDOH) Interventions    Readmission Risk Interventions No flowsheet data found.

## 2019-02-10 NOTE — Discharge Summary (Addendum)
Patrick Daniel Discharge Summary  Patient name: Patrick Daniel Medical record number: [id number] Date of birth: Oct 21, 1956 Age: 63 y.o. 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 Maudie Mercury on day of discharge) General: NAD, non-toxic, well-appearing, sitting comfortably in bed.  Cardiovascular: RRR, normal S1, S2. 2+ RP bilaterally. No BLEE   Respiratory: CTAB. No IWOB.  Abdomen: + BS. NT, ND, soft to palpation.  Extremities: Warm and well perfused. Moving spontaneously.  Neuro: A & O x 4.   Brief [facility] Course:  Patient is a 63 year old with complicated neurological history, question of neurodegenerative disorder with cervical dystonia as well.  He has baseline dysarthria.  He lives at an independent living facility.  On admission, he was brought in by EMS after having been found down last seen normal 3 days prior to admission and with altered mental status. Labs were consistent with rhabdomyolysis.  Patient was empirically started on Vanco and cefepime and blood cultures were drawn that grew 1 out of 2 methicillin-resistant coagulase-negative staph.  This was unable to be dismissed as a contaminant due to open skin wounds. Cefepime was discontinued.  Vancomycin was switched to linezolid for total 7-day course.  Repeat blood cultures were negative. Neurology was consulted for altered mental  status and recommended LP and ID consult as well as acyclovir and fluids.   CT head and MRI suggestive of HSV encephalitis. HSV 2 PCR for CSF fluid was positive. Acyclovir was given for total of 21 days (last day 5/8). No further follow up was recommended by infectious disease. Patient had a prolonged course of illness with severe dysarthria and dysphasia that improved greatly.   During his [facility] stay patient had an acute kidney injury felt likely to be multifactorial.  He had ATN from vancomycin, IV and Cyclovir, Linezolid. With the discontinuation of vancomycin and linezolid as well as fluid resuscitation with good urine output patient's kidney function improved to normal. Cr on discharge was 0.70.   He also had urinary retention and required a Foley and was started on Flomax.  He was trialed off of foley three times, the last trial on 03/02/19 and foley was replaced on that day.   Additionally, patient was found to have left humeral neck fracture which was mildly comminuted.  Sling was placed on admission, Ortho was consulted who recommended possible reverse total shoulder arthroplasty but would need to be symptomatic enough to warrant procedure and would have to be clear of infection. Please continue to monitor patient's left shoulder function for possible orthopedic intervention.  If pain is impeding physical therapy, orthopedic surgery should be contacted.  Patient was also found to be anemic on admission to 9.8, hemoglobin decreased to 6.0, received one unit pRBC with good response, had another dramatic drop so received another unit with good response and a drastic drop again. FOBT was positive. GI was consulted, recommended against any inpatient procedures given patient's status. Agreed with protonix. Hgb on discharge stable at 10.0.    Patient also found  to have Methicillin Resistant CoNS Bacteremia. Treated with 7d tx with Vancomycin (4/15-4/18) and Linezolid (4/18-4/21) with repeat Bcx  negative.  PT/OT was consulted and recommended SNF.  Patient previously lived by himself at an assisted living facility.  He would like to return to his prior living situation after graduating from SNF.    Patient's sister is point of contact.  Brayton Layman  [phone]  Antimicrobials during inpatient: Vancomycin 4/15-4/18 Cefepime 4/15-4/16  Linezolid 4/18-4/21  Acyclovir 4/15-5/8  Issues for Follow Up:  1. Patient discharged with Foley catheter given urinary retention.  He will need follow-up with urology.  Patient's sister is requesting that SNF MD make referral if they are listed as PCP. 2. TSH, T4 and free T3 drawn prior to discharge. At admission, was low.  Please follow-up with the results. 3. Follow up with GI for possible GI bleed as patient had positive FOBT in setting of hemoglobin drop from 9.8 to 6.0. Hemoglobin was stable from 9-10. 40mg  PTX x 1 month.  4. Follow up with ortho about left humerus 5. Follow up with palliative 6. Continue SLP therapy at SNF and afterwards.  7. Consider restarting cymbalta 60 mg daily. Klonipin discontinued while in [facility].  8. Patient to remain DNR. Was placed DNR by daughter when he was not able to make decisions. Should re-assess decision making capacity and ask patient about DNR status.  9. Continue botox injections at SNF for cervical dystonia.   Significant Procedures:  Blood transfusion x2  Significant Labs and Imaging:  Recent Labs  Lab 02/27/19 0439 03/02/19 0553  WBC 10.1 7.0  HGB 8.8* 10.0*  HCT 26.6* 31.0*  PLT 476* 488*   Recent Labs  Lab 02/27/19 0439 03/02/19 0553 03/02/19 2106 03/04/19 0352  NA 145 150* 148* 140  K 3.8 3.4* 3.9 3.2*  CL 109 115* 113* 106  CO2 24 24 25 25   GLUCOSE 120* 128* 132* 115*  BUN 25* 30* 33* 21*  CREATININE 0.84 0.78 0.86 0.70  CALCIUM 9.2 9.0 9.0 8.6*   Results/Tests Pending at Time of Discharge:  Unresulted Labs (From admission, onward)    Start     Ordered    03/05/19 5537  Basic metabolic panel  Tomorrow morning,   R    Question:  Specimen collection method  Answer:  Lab=Lab collect   03/04/19 1159   03/05/19 0500  CBC  Tomorrow morning,   R    Question:  Specimen collection method  Answer:  Lab=Lab collect   03/04/19 1159   03/04/19 1108  T3, free  Once,   R    Question:  Specimen collection method  Answer:  Lab=Lab collect   03/04/19 1107           Discharge Medications:  Allergies as of 03/04/2019      Reactions   Baclofen Other (See Comments)   Weakness, drowsiness      Medication List    STOP taking these medications   clonazePAM 0.5 MG tablet Commonly known as:  KLONOPIN   doxycycline 20 MG tablet Commonly known as:  PERIOSTAT   DULoxetine 60 MG capsule Commonly known as:  CYMBALTA   mometasone 0.1 % cream Commonly known as:  ELOCON     TAKE these medications   amLODipine 10 MG tablet Commonly known as:  NORVASC Take 1 tablet (10 mg total) by mouth daily. Start taking on:  Mar 05, 2019   feeding supplement (PRO-STAT SUGAR FREE 64) Liqd Take 30 mLs by  mouth 2 (two) times daily.   pantoprazole 40 MG tablet Commonly known as:  PROTONIX Take 1 tablet (40 mg total) by mouth daily. Start taking on:  Mar 05, 2019   polyethylene glycol 17 g packet Commonly known as:  MIRALAX / GLYCOLAX Take 17 g by mouth daily. Start taking on:  Mar 05, 2019   tamsulosin 0.4 MG Caps capsule Commonly known as:  FLOMAX Take 2 capsules (0.8 mg total) by mouth daily. Start taking on:  Mar 05, 2019       Discharge Instructions: Please refer to Patient Instructions section of EMR for full details.  Patient was counseled important signs and symptoms that should prompt return to medical care, changes in medications, dietary instructions, activity restrictions, and follow up appointments.   Follow-Up Appointments: Contact information for after-discharge care    Destination    [facility] .    Service:  Skilled Nursing Contact information: [address] Pricilla Handler [place]              [place]ac[place]ina Evelyn, [place], [place] 03/04/2019, 2:18 PM PGY-2, [place]

## 2019-02-11 DIAGNOSIS — R7881 Bacteremia: Secondary | ICD-10-CM

## 2019-02-11 DIAGNOSIS — W19XXXD Unspecified fall, subsequent encounter: Secondary | ICD-10-CM

## 2019-02-11 DIAGNOSIS — Z888 Allergy status to other drugs, medicaments and biological substances status: Secondary | ICD-10-CM

## 2019-02-11 DIAGNOSIS — X58XXXA Exposure to other specified factors, initial encounter: Secondary | ICD-10-CM

## 2019-02-11 DIAGNOSIS — T796XXD Traumatic ischemia of muscle, subsequent encounter: Secondary | ICD-10-CM

## 2019-02-11 DIAGNOSIS — R4701 Aphasia: Secondary | ICD-10-CM

## 2019-02-11 DIAGNOSIS — B004 Herpesviral encephalitis: Principal | ICD-10-CM

## 2019-02-11 LAB — CBC
HCT: 22.5 % — ABNORMAL LOW (ref 39.0–52.0)
HCT: 23.8 % — ABNORMAL LOW (ref 39.0–52.0)
Hemoglobin: 7.3 g/dL — ABNORMAL LOW (ref 13.0–17.0)
Hemoglobin: 7.9 g/dL — ABNORMAL LOW (ref 13.0–17.0)
MCH: 30.4 pg (ref 26.0–34.0)
MCH: 31.3 pg (ref 26.0–34.0)
MCHC: 32.4 g/dL (ref 30.0–36.0)
MCHC: 33.2 g/dL (ref 30.0–36.0)
MCV: 93.8 fL (ref 80.0–100.0)
MCV: 94.4 fL (ref 80.0–100.0)
Platelets: 348 10*3/uL (ref 150–400)
Platelets: 373 10*3/uL (ref 150–400)
RBC: 2.4 MIL/uL — ABNORMAL LOW (ref 4.22–5.81)
RBC: 2.52 MIL/uL — ABNORMAL LOW (ref 4.22–5.81)
RDW: 14.1 % (ref 11.5–15.5)
RDW: 14.4 % (ref 11.5–15.5)
WBC: 10.3 10*3/uL (ref 4.0–10.5)
WBC: 12.8 10*3/uL — ABNORMAL HIGH (ref 4.0–10.5)
nRBC: 0.5 % — ABNORMAL HIGH (ref 0.0–0.2)
nRBC: 0.6 % — ABNORMAL HIGH (ref 0.0–0.2)

## 2019-02-11 LAB — IRON AND TIBC
Iron: 51 ug/dL (ref 45–182)
Saturation Ratios: 22 % (ref 17.9–39.5)
TIBC: 230 ug/dL — ABNORMAL LOW (ref 250–450)
UIBC: 179 ug/dL

## 2019-02-11 LAB — MAGNESIUM: Magnesium: 2.7 mg/dL — ABNORMAL HIGH (ref 1.7–2.4)

## 2019-02-11 LAB — COMPREHENSIVE METABOLIC PANEL
ALT: 39 U/L (ref 0–44)
AST: 51 U/L — ABNORMAL HIGH (ref 15–41)
Albumin: 3.1 g/dL — ABNORMAL LOW (ref 3.5–5.0)
Alkaline Phosphatase: 55 U/L (ref 38–126)
Anion gap: 11 (ref 5–15)
BUN: 26 mg/dL — ABNORMAL HIGH (ref 6–20)
CO2: 21 mmol/L — ABNORMAL LOW (ref 22–32)
Calcium: 8.2 mg/dL — ABNORMAL LOW (ref 8.9–10.3)
Chloride: 117 mmol/L — ABNORMAL HIGH (ref 98–111)
Creatinine, Ser: 0.94 mg/dL (ref 0.61–1.24)
GFR calc Af Amer: >60 mL/min (ref >60)
GFR calc non Af Amer: >60 mL/min (ref >60)
Glucose, Bld: 108 mg/dL — ABNORMAL HIGH (ref 70–99)
Potassium: 3.4 mmol/L — ABNORMAL LOW (ref 3.5–5.1)
Sodium: 149 mmol/L — ABNORMAL HIGH (ref 135–145)
Total Bilirubin: 1.3 mg/dL — ABNORMAL HIGH (ref 0.3–1.2)
Total Protein: 6 g/dL — ABNORMAL LOW (ref 6.5–8.1)

## 2019-02-11 LAB — RETICULOCYTES
Immature Retic Fract: 23.5 % — ABNORMAL HIGH (ref 2.3–15.9)
RBC.: 2.52 MIL/uL — ABNORMAL LOW (ref 4.22–5.81)
Retic Count, Absolute: 128 10*3/uL (ref 19.0–186.0)
Retic Ct Pct: 5.1 % — ABNORMAL HIGH (ref 0.4–3.1)

## 2019-02-11 LAB — VITAMIN B12: Vitamin B-12: 1196 pg/mL — ABNORMAL HIGH (ref 180–914)

## 2019-02-11 LAB — HSV DNA BY PCR (REFERENCE LAB)
HSV 1 DNA: NEGATIVE
HSV 2 DNA: POSITIVE

## 2019-02-11 LAB — CK: Total CK: 1220 U/L — ABNORMAL HIGH (ref 49–397)

## 2019-02-11 LAB — FERRITIN: Ferritin: 327 ng/mL (ref 24–336)

## 2019-02-11 LAB — FOLATE: Folate: 33.2 ng/mL (ref >5.9)

## 2019-02-11 MED ORDER — SODIUM CHLORIDE 0.9 % IV SOLN: INTRAVENOUS | Status: DC

## 2019-02-11 MED ORDER — POTASSIUM CHLORIDE 10 MEQ/100ML IV SOLN: 10.0000 meq | INTRAVENOUS | Status: DC

## 2019-02-11 MED ORDER — WHITE PETROLATUM EX OINT
TOPICAL_OINTMENT | CUTANEOUS | Status: AC
  Administered 2019-02-11: 22:00:00
  Filled 2019-02-11: qty 28.35

## 2019-02-11 MED ORDER — HYDRALAZINE HCL 20 MG/ML IJ SOLN
20.0000 mg | Freq: Three times a day (TID) | INTRAMUSCULAR | Status: DC
  Administered 2019-02-11 – 2019-02-16 (×14): 20 mg via INTRAVENOUS
  Filled 2019-02-11 (×14): qty 1

## 2019-02-11 MED ORDER — SODIUM CHLORIDE 0.9 % IV SOLN
Freq: Once | INTRAVENOUS | Status: AC
  Administered 2019-02-11: 12:00:00 via INTRAVENOUS
  Filled 2019-02-11: qty 1000

## 2019-02-11 MED ORDER — POTASSIUM CHLORIDE IN NACL 20-0.9 MEQ/L-% IV SOLN
INTRAVENOUS | Status: DC
  Administered 2019-02-12: 04:00:00 via INTRAVENOUS
  Filled 2019-02-11: qty 1000

## 2019-02-11 MED ORDER — AMLODIPINE BESYLATE 10 MG PO TABS: 10.0000 mg | ORAL_TABLET | Freq: Every day | ORAL | Status: DC

## 2019-02-11 MED ORDER — SODIUM CHLORIDE 0.9 % IV SOLN
INTRAVENOUS | Status: DC
  Filled 2019-02-11 (×3): qty 1000

## 2019-02-11 NOTE — Progress Notes (Signed)
Family Medicine Teaching Service Daily Progress Note Intern Pager: [phone]  Patient name: Patrick Daniel Medical record number: [id number] Date of birth: 1956-06-03 Age: 63 y.o. Gender: male  Primary Care Provider: Marco Collie, MD Consultants: neuro, ID, orthopedics (s/o) Code Status: FULL  Pt Overview and Major Events to Date:  4/15 admitted having been found down, AMS, neuro consulted, LP suggestive of HSV encephalitis, acyclovir started  Assessment and Plan: Patrick Daniel is a 63 y.o. male presenting with AMS after being found down. PMH is significant for cervical dystonia, dysarthria, HLD, gait abnormality, h/o myelopathy  AMS 2/2 HSV encephalitis.  Has dysarthria with baseline  paraplegia from h/o myelopathy. CSF c/w aseptic meningitis/encephalitis and MRI brain showed medical temporal predilection. EEG showed FIRDA and generalized irregular slow activity without seizure predisposition. Last fever 4/16 pm and has not required antipyretics in last 24h. BCx showed 1 of 2 coag neg staph which could be contaminant. - frequent neuro checks - continue acyclovir (4/16-) per neurology  -per ID, acyclovir for 21d total, continue vanc and repeat BCx, f/u HSV CSF PCR - s/p cefepime (4/15-4/16), flagyl (4/15), continues on vanc (4/15-),  -obtain repeat BCx today - CSF cx: NG<12h - PT/OT recommending SNF  - SLP recommending NPO, will need reeval on Sunday - am CBC, BMP  - APAP prn   Anemia: Hemoglobin stable at 7.3. Iron studies unremarkable  -Daily CBC -obtain remainder of anemia panel for ferritin, vitb12, retic, and folate levels   Elevated BP with probably h/o HTN - start hydralazine IV 20mg  q8h schedule while NPO, could change to norvasc when taking po  Rhabdomyolysis   Resolved AKI - improving. CK 1751> peak 1988> now 1220, Cr 1.38> now 0.94 -IVF at NS + 20K 125 cc/hr -monitor BMP -strict I/O   Hypokalemia. K 3.4 - monitor BMP - adding K 20 mEq to MIVF as per above  L  humeral neck fracture- mildly comminuted fx of L humeral neck noticed w/ significant bruising to LUE secondary to fall at home. Patient denies pain this morning -ortho recommended sling for now, could consider reverse total shoulder arthroplasty after clear of infection if still symptomatic at that time. Follow up outpt  O2 requirement, uncertain etiology. Patient is 96% on 2L [place] and no record of baseline oxygen requirement or of a chronic medical problem as etiology - wean to RA. If unable to do so may consider CXR tomorrow  Cervical dystonia- chronic, stable- patient follows at [facility] for botox injections, last injection 12/01/18 - f/u outpt  Abnormal TSH. Likely due to significant acute medical illness - recheck as outpt  FEN/GI: NPO  Prophylaxis: SCDs, no pharm DVT ppx d/t blood in ventricles on MRI brain   Disposition: pending medical management  Subjective:  Patient with garbled speech. Able to answer yes/no questions. Denies pain  Objective: Temp:  [97.8 F (36.6 C)-103.2 F (39.6 C)] 98.2 F (36.8 C) (04/17 0746) Pulse Rate:  [117-139] 124 (04/17 0746) Resp:  [18-24] 22 (04/17 0746) BP: (144-188)/(62-76) 188/74 (04/17 0746) SpO2:  [96 %-100 %] 96 % (04/17 0746) FiO2 (%):  [2 %] 2 % (04/17 0100) Physical Exam:  General: Laying in bed with mitts on, in NAD Cardiovascular: Tachycardic, regular. Normal S1 and S2. No murmurs Respiratory: Clear to auscultation bilaterally, NWOB  Abdomen: Soft, nontender, nondistended, + bowel sounds  Extremities: No edema, left shoulder in sling Neuro: awake and alert. Follows simple commands. EOMI.   Laboratory: Recent Labs  Lab 02/09/19 1529 02/10/19 0451  02/11/19 0342  WBC 12.4* 8.6 10.3  HGB 9.8* 7.7* 7.3*  HCT 29.0* 23.4* 22.5*  PLT 444* 392 348   Recent Labs  Lab 02/09/19 1529 02/10/19 0451 02/11/19 0342  NA 138 145 149*  K 4.6 4.0 3.4*  CL 97* 110 117*  CO2 21* 21* 21*  BUN 81* 46* 26*   CREATININE 1.38* 0.95 0.94  CALCIUM 9.4 8.4* 8.2*  PROT 8.1  --  6.0*  BILITOT 1.9*  --  1.3*  ALKPHOS 68  --  55  ALT 46*  --  39  AST 54*  --  51*  GLUCOSE 143* 125* 108*   Iron/TIBC/Ferritin/ %Sat    Component Value Date/Time   IRON 51 02/11/2019 0342   TIBC 230 (L) 02/11/2019 0342   IRONPCTSAT 22 02/11/2019 0342    Imaging/Diagnostic Tests: Mr Jeri [place] And Wo Contrast Result Date: 02/09/2019 IMPRESSION: 1. Abnormal diffusion restriction within the mesial right temporal lobe, most consistent with acute or early subacute infarct. This distribution may also be seen in infection, particularly herpes encephalitis, though this seems less likely in this presentation, particularly if the patient is afebrile. 2. Trace intraventricular blood products within the occipital horns of both lateral ventricles. Electronically Signed   By: Ulyses Jarred M.D.   On: 02/09/2019 20:34   Bufford Lope, DO 02/11/2019, 7:49 AM PGY-3, [place] Intern pager: [phone], text pages welcome

## 2019-02-11 NOTE — Progress Notes (Signed)
Spoke to rounding physician regarding BP trends.  Will continue to monitor for now. No new orders.

## 2019-02-11 NOTE — Progress Notes (Signed)
[facility] for Infectious Disease  Date of Admission:  02/09/2019     Total days of antibiotics            Patient ID: Patrick Daniel is a 64 y.o. male with  Principal Problem:   Encephalitis Active Problems:   Altered mental status   AKI (acute kidney injury) ([place])   Dehydration   Fall   Traumatic rhabdomyolysis ([place])   Unspecified fracture of upper end of left humerus, initial encounter for closed fracture   . chlorhexidine  15 mL Mouth Rinse BID  . hydrALAZINE  20 mg Intravenous Q8H  . lidocaine  10 mL Intradermal Once  . mouth rinse  15 mL Mouth Rinse q12n4p    SUBJECTIVE: Shakes head no to questions inquiring about pain. Able to tell me his sister's name. Shakes head no to headache.   Review of Systems: Review of Systems  Unable to perform ROS: Mental status change    Allergies  Allergen Reactions  . Baclofen     Weakness, drowsiness    OBJECTIVE: Vitals:   02/11/19 0100 02/11/19 0500 02/11/19 0746 02/11/19 1111  BP: (!) 165/76 (!) 177/62 (!) 188/74 (!) 202/88  Pulse: (!) 117 (!) 120 (!) 124 (!) 121  Resp: 20  (!) 22 20  Temp: 98.5 F (36.9 C) 97.8 F (36.6 C) 98.2 F (36.8 C) 100 F (37.8 C)  TempSrc: Oral Oral Oral Axillary  SpO2: 100% 100% 96% 99%  Weight:   65 kg   Height:   5\' 6"  (1.676 m)    Body mass index is 23.13 kg/m.  Physical Exam Constitutional:      Comments: Sitting up in recliner - easily awakens. Shakes head no and yes appropriately.   HENT:     Mouth/Throat:     Mouth: Mucous membranes are dry.  Eyes:     General: No scleral icterus.    Pupils: Pupils are equal, round, and reactive to light.  Cardiovascular:     Rate and Rhythm: Tachycardia present.     Pulses: Normal pulses.     Heart sounds: No murmur.  Pulmonary:     Effort: Pulmonary effort is normal.     Breath sounds: Normal breath sounds.  Abdominal:     General: There is no distension.     Palpations: Abdomen is soft.  Skin:    General: Skin is  warm and dry.     Capillary Refill: Capillary refill takes less than 2 seconds.     Comments: Multiple scabbed abrasions on knees. All well healed w/o signs of infection.   Neurological:     Comments: Neurologically improving - able to shake head yes and no appropriately. Attempting to communicate more verbally. Still with expressive aphasia and difficult to understand speech, although it is more intelligible      Lab Results Lab Results  Component Value Date   WBC 10.3 02/11/2019   HGB 7.3 (L) 02/11/2019   HCT 22.5 (L) 02/11/2019   MCV 93.8 02/11/2019   PLT 348 02/11/2019    Lab Results  Component Value Date   CREATININE 0.94 02/11/2019   BUN 26 (H) 02/11/2019   NA 149 (H) 02/11/2019   K 3.4 (L) 02/11/2019   CL 117 (H) 02/11/2019   CO2 21 (L) 02/11/2019    Lab Results  Component Value Date   ALT 39 02/11/2019   AST 51 (H) 02/11/2019   ALKPHOS 55 02/11/2019   BILITOT 1.3 (H)  02/11/2019     Microbiology: Recent Results (from the past 240 hour(s))  Blood culture (routine x 2)     Status: None (Preliminary result)   Collection Time: 02/09/19  5:08 PM  Result Value Ref Range Status   Specimen Description BLOOD RIGHT ANTECUBITAL  Final   Special Requests   Final    BOTTLES DRAWN AEROBIC AND ANAEROBIC Blood Culture results may not be optimal due to an inadequate volume of blood received in culture bottles   Culture  Setup Time   Final    GRAM POSITIVE COCCI IN CLUSTERS IN BOTH AEROBIC AND ANAEROBIC BOTTLES CRITICAL RESULT CALLED TO, READ BACK BY AND VERIFIED WITH: PHARMD ERIN DEJA AT 1610 ON 02/10/2019 BY MHOUEGNIFIO Performed at [facility], [place] [address], [place], [place] [address]    Culture GRAM POSITIVE COCCI IN CLUSTERS  Final   Report Status PENDING  Incomplete  Blood Culture ID Panel (Reflexed)     Status: Abnormal   Collection Time: 02/09/19  5:08 PM  Result Value Ref Range Status   Enterococcus species NOT DETECTED NOT DETECTED Final   Listeria  monocytogenes NOT DETECTED NOT DETECTED Final   Staphylococcus species DETECTED (A) NOT DETECTED Final    Comment: Methicillin (oxacillin) resistant coagulase negative staphylococcus. Possible blood culture contaminant (unless isolated from more than one blood culture draw or clinical case suggests pathogenicity). No antibiotic treatment is indicated for blood  culture contaminants. CRITICAL RESULT CALLED TO, READ BACK BY AND VERIFIED WITH: PHARMD ERIN DEJA AT 1332 ON 02/10/2019 BY MHOUEGNIFIO    Staphylococcus aureus (BCID) NOT DETECTED NOT DETECTED Final   Methicillin resistance DETECTED (A) NOT DETECTED Final    Comment: CRITICAL RESULT CALLED TO, READ BACK BY AND VERIFIED WITH: PHARMD ERIN DEJA AT 1332 ON 02/10/2019 BY MHOUEGNIFIO    Streptococcus species NOT DETECTED NOT DETECTED Final   Streptococcus agalactiae NOT DETECTED NOT DETECTED Final   Streptococcus pneumoniae NOT DETECTED NOT DETECTED Final   Streptococcus pyogenes NOT DETECTED NOT DETECTED Final   Acinetobacter baumannii NOT DETECTED NOT DETECTED Final   Enterobacteriaceae species NOT DETECTED NOT DETECTED Final   Enterobacter cloacae complex NOT DETECTED NOT DETECTED Final   Escherichia coli NOT DETECTED NOT DETECTED Final   Klebsiella oxytoca NOT DETECTED NOT DETECTED Final   Klebsiella pneumoniae NOT DETECTED NOT DETECTED Final   Proteus species NOT DETECTED NOT DETECTED Final   Serratia marcescens NOT DETECTED NOT DETECTED Final   Haemophilus influenzae NOT DETECTED NOT DETECTED Final   Neisseria meningitidis NOT DETECTED NOT DETECTED Final   Pseudomonas aeruginosa NOT DETECTED NOT DETECTED Final   Candida albicans NOT DETECTED NOT DETECTED Final   Candida glabrata NOT DETECTED NOT DETECTED Final   Candida krusei NOT DETECTED NOT DETECTED Final   Candida parapsilosis NOT DETECTED NOT DETECTED Final   Candida tropicalis NOT DETECTED NOT DETECTED Final    Comment: Performed at [facility], [place]. [address]  [place], [place] [address]  Blood culture (routine x 2)     Status: None (Preliminary result)   Collection Time: 02/09/19  5:15 PM  Result Value Ref Range Status   Specimen Description BLOOD LEFT ANTECUBITAL  Final   Special Requests   Final    BOTTLES DRAWN AEROBIC AND ANAEROBIC Blood Culture adequate volume   Culture   Final    NO GROWTH 2 DAYS Performed at [facility], [place] [address], [place], [place] [address]    Report Status PENDING  Incomplete  CSF culture     Status: None (Preliminary result)   Collection Time: 02/09/19 10:45 PM  Result Value Ref Range Status   Specimen Description CSF  Final   Special Requests Normal  Final   Gram Stain   Final    WBC PRESENT, PREDOMINANTLY MONONUCLEAR NO ORGANISMS SEEN CYTOSPIN SMEAR    Culture   Final    NO GROWTH 2 DAYS Performed at [facility], [place] [address], [place], [place] [address]    Report Status PENDING  Incomplete  Culture, fungus without smear     Status: None (Preliminary result)   Collection Time: 02/09/19 10:45 PM  Result Value Ref Range Status   Specimen Description CSF  Final   Special Requests Normal  Final   Culture   Final    NO FUNGUS ISOLATED AFTER 2 DAYS Performed at [facility], [place] [address] [place], [place] [address]    Report Status PENDING  Incomplete  Anaerobic culture     Status: None (Preliminary result)   Collection Time: 02/09/19 10:45 PM  Result Value Ref Range Status   Specimen Description CSF  Final   Special Requests Normal  Final   Gram Stain PENDING  Incomplete   Culture   Final    NO GROWTH 2 DAYS Performed at [facility], [place] [address], [place], [place] [address]    Report Status PENDING  Incomplete     ASSESSMENT & PLAN:  1. Encephalitis= Seems to be improving nicely with acyclovir therapy. Still awaiting confirmation of CSF PCR but at this point other diagnosis seems unlikely given clinical presentation/radiology findings. Continue acyclovir therapy.    2. CoNS Bacteremia = 1/2 blood cultures positive for GPCs - not yet ID'd but likely skin contaminant. Given his multiple open lesions may be transient bacteremia vs skin contaminant from draw. Repeat cultures pending. He had a low-grade temp today 100 F - continue to follow.   3. Medication Monitoring = Creatinine stable 0.94  4. Rhabdo/AKI = per primary team, CK slowly improving - 1220 today.    Janene Madeira, MSN, NP-C [facility] [place] Cell: [phone] Pager: [phone]  02/11/2019  11:23 AM

## 2019-02-11 NOTE — Progress Notes (Addendum)
Occupational Therapy Treatment Patient Details Name: Patrick Daniel MRN: [id number] DOB: May 04, 1956 Today's Date: 02/11/2019    History of present illness 63 y.o. male presenting with AMS after being [facility] down at [facility]. PMH is significant for cervical dystonia, dysarthria, HLD, gait abnormality (?CNS degenerative disorder?). Patient unable to provide insight to what caused fall given chronic speech issues that have acutely worsened. Known mobility issues at baseline with frequent falls. Concern for traumatic head injury after fall vs stroke vs seizure. Head CT with temporal lobe edema. MRI with concern for acute or early subacute infarct vs infection like HSV. Pt also found to have comminuted fx of L humeral neck, ortho following.    OT comments  Pt presents supine in bed agreeable to working with therapy. Pt continues to present with decrease in functional mobility including decreased sitting and standing balance. Pt requiring maxA+2 for bed mobility and to perform squat pivot transfer to drop arm recliner. While seated EOB pt requiring at least modA for static sitting balance. He requires max hand over hand assist to initiate simple grooming ADL (face washing task) once seated in recliner. Pt following simple commands intermittently given increased time and multimodal cues. Feel SNF recommendation remains appropriate at this time. Will continue to follow acutely. BP end of session once seated in recliner 185/91 (112).    Follow Up Recommendations  SNF;Supervision/Assistance - 24 hour    Equipment Recommendations  Other (comment)(TBA)          Precautions / Restrictions Precautions Precautions: Fall;Shoulder;Other (comment) Type of Shoulder Precautions: sling, NWB  Shoulder Interventions: Shoulder sling/immobilizer Precaution Comments: watch HR Required Braces or Orthoses: Sling Restrictions Weight Bearing Restrictions: Yes LUE Weight Bearing: Non weight bearing       Mobility Bed  Mobility Overal bed mobility: Needs Assistance Bed Mobility: Rolling;Sidelying to Sit Rolling: Max assist;+2 for physical assistance;+2 for safety/equipment Sidelying to sit: Max assist;+2 for physical assistance;+2 for safety/equipment       General bed mobility comments: pt able to follow some commands and initiates moving LEs towards EOB; requires assist for positioning LLE to roll onto R side, assist for LEs over EOB and to elevate trunk; pt with poor sitting balance requiring assist to stabilize once upright  Transfers Overall transfer level: Needs assistance Equipment used: 2 person hand held assist Transfers: Squat Pivot Transfers;Sit to/from Stand Sit to Stand: Max assist;+2 physical assistance;+2 safety/equipment   Squat pivot transfers: Max assist;+2 physical assistance;+2 safety/equipment     General transfer comment: attempted sit<>stand from EOB - pt able to achieve partial stand from EOB with two person assist however unable to power through LEs to achieve full upright position due to LE weakness; performed squat pivot transfer to drop arm recliner, using two person assist and bed pad to assist with guiding hips    Balance Overall balance assessment: Needs assistance Sitting-balance support: Feet supported Sitting balance-Leahy Scale: Poor Sitting balance - Comments: requires overall modA for sitting balance; R lateral lean Postural control: Right lateral lean                                 ADL either performed or assessed with clinical judgement   ADL Overall ADL's : Needs assistance/impaired     Grooming: Moderate assistance;Maximal assistance;Sitting Grooming Details (indicate cue type and reason): pt requires max hand over hand assist to initiate bringing hand to face for simple grooming task  Functional mobility during ADLs: Maximal assistance;+2 for physical assistance;+2 for safety/equipment(squat pivot  transfer)       Vision       Perception     Praxis      Cognition Arousal/Alertness: Awake/alert(sleepy end of session due to fatigue) Behavior During Therapy: [facility] for tasks assessed/performed;Flat affect Overall Cognitive Status: Impaired/Different from baseline Area of Impairment: Attention;Following commands;Safety/judgement;Awareness                   Current Attention Level: Focused   Following Commands: Follows one step commands inconsistently;Follows one step commands with increased time Safety/Judgement: Decreased awareness of deficits Awareness: Intellectual   General Comments: pt able to answer yes/no questions; requires multimodal cues to follow commands and to redirect to task at hand        Exercises Exercises: Hand exercises Hand Exercises Wrist Flexion: AROM;Left;5 reps Wrist Extension: AROM;Left;5 reps Digit Composite Flexion: AROM;Left;5 reps Composite Extension: AROM;Left;5 reps   Shoulder Instructions       General Comments HR fluctuating 128-131 with activity during session, on RA with SpO2 94-96%     Pertinent Vitals/ Pain       Pain Assessment: No/denies pain Faces Pain Scale: No hurt  Home Living                                          Prior Functioning/Environment              Frequency  Min 3X/week        Progress Toward Goals  OT Goals(current goals can now be found in the care plan section)  Progress towards OT goals: Progressing toward goals  Acute Rehab OT Goals Patient Stated Goal: none stated today, agreeable to working  with therapies OT Goal Formulation: With patient Time For Goal Achievement: 02/24/19 Potential to Achieve Goals: Good  Plan Discharge plan remains appropriate    Co-evaluation    PT/OT/SLP Co-Evaluation/Treatment: Yes Reason for Co-Treatment: For patient/therapist safety;To address functional/ADL transfers   OT goals addressed during session: ADL's and  self-care;Strengthening/ROM      AM-PAC OT "6 Clicks" Daily Activity     Outcome Measure   Help from another person eating meals?: A Lot Help from another person taking care of personal grooming?: A Lot Help from another person toileting, which includes using toliet, bedpan, or urinal?: Total Help from another person bathing (including washing, rinsing, drying)?: Total Help from another person to put on and taking off regular upper body clothing?: Total Help from another person to put on and taking off regular lower body clothing?: Total 6 Click Score: 8    End of Session Equipment Utilized During Treatment: Gait belt  OT Visit Diagnosis: Muscle weakness (generalized) (M62.81);Other symptoms and signs involving cognitive function;History of falling (Z91.81)   Activity Tolerance Patient tolerated treatment well   Patient Left in chair;with call bell/phone within reach;with chair alarm set   Nurse Communication Mobility status        Time: 7893-8101 OT Time Calculation (min): 27 min  Charges: OT General Charges $OT Visit: 1 Visit OT Treatments $Self Care/Home Management : 8-22 mins  Lou Cal, OT Supplemental Rehabilitation Services Pager [phone] Office [phone]    Raymondo Band 02/11/2019, 11:55 AM

## 2019-02-11 NOTE — Progress Notes (Signed)
Spoke to attending regarding BP trends.  Will monitor for now. No new orders.

## 2019-02-11 NOTE — NC FL2 (Signed)
[place] MEDICAID FL2 LEVEL OF CARE SCREENING TOOL     IDENTIFICATION  Patient Name: Patrick Daniel Birthdate: January 29, 1956 Sex: male Admission Date (Current Location): 02/09/2019  [facility] and [place] Number:  Herbalist and Address:  [facility] [place], [place] [address], [place], [place] [address]      Provider Number: [id number]  Attending Physician Name and Address:  Zenia Resides, MD  Relative Name and Phone Number:  Lollie Sails, [phone]    Current Level of Care: Hospital Recommended Level of Care: [employer] Prior Approval Number: [id number]  Date Approved/Denied: 10/25/12 PASRR Number:    Discharge Plan: SNF    Current Diagnoses: Patient Active Problem List   Diagnosis Date Noted  . AKI (acute kidney injury) ([place])   . Dehydration   . Encephalitis   . Fall   . Traumatic rhabdomyolysis ([place])   . Unspecified fracture of upper end of left humerus, initial encounter for closed fracture   . Altered mental status 02/09/2019  . Paresthesia 08/07/2016  . Chronic migraine w/o aura w/o status migrainosus, not intractable 06/03/2016  . H/O arthrodesis 05/17/2014  . Paraplegia ([place])   . Gait disturbance   . Dysarthria   . Cervical dystonia   . Myelopathy ([place])     Orientation RESPIRATION BLADDER Height & Weight     Self, Place  O2(sp02 96, [place], flow rate 2) Incontinent, External catheter Weight: 143 lb 4.8 oz (65 kg) Height:  5\' 6"  (167.6 cm)  BEHAVIORAL SYMPTOMS/MOOD NEUROLOGICAL BOWEL NUTRITION STATUS      Continent Diet(NPO at this time)  AMBULATORY STATUS COMMUNICATION OF NEEDS Skin   Total Care(Pt is paraplegic) Verbally Skin abrasions, Bruising(skin abrasion on knee and bruising on arm)                       Personal Care Assistance Level of Assistance  Bathing, Feeding, Dressing, Total care Bathing Assistance: Maximum assistance Feeding assistance: Maximum assistance Dressing Assistance:  Maximum assistance Total Care Assistance: Maximum assistance   Functional Limitations Info  Sight, Hearing, Speech Sight Info: Adequate Hearing Info: Adequate Speech Info: Adequate    SPECIAL CARE FACTORS FREQUENCY  PT (By licensed PT), OT (By licensed OT), Speech therapy     PT Frequency: 5x/wk OT Frequency: 5x/wk     Speech Therapy Frequency: 2x/wk      Contractures Contractures Info: Not present    Additional Factors Info  Code Status, Allergies Code Status Info: Full Code Allergies Info: Baclofen           Current Medications (02/11/2019):  This is the current hospital active medication list Current Facility-Administered Medications  Medication Dose Route Frequency Provider Last Rate Last Dose  . 0.9 %  sodium chloride infusion   Intravenous Continuous Kerney Elbe, MD 150 mL/hr at 02/10/19 2141    . acetaminophen (TYLENOL) suppository 650 mg  650 mg Rectal Q4H PRN Sela Hilding, MD   650 mg at 02/10/19 0843  . acyclovir (ZOVIRAX) 650 mg in dextrose 5 % 100 mL IVPB  10 mg/kg Intravenous Q8H Riccio, Angela C, DO 113 mL/hr at 02/11/19 0619 650 mg at 02/11/19 0619  . chlorhexidine (PERIDEX) 0.12 % solution 15 mL  15 mL Mouth Rinse BID Zenia Resides, MD   15 mL at 02/10/19 1604  . lidocaine (XYLOCAINE) 2 % (with pres) injection 200 mg  10 mL Intradermal Once Steve Rattler, DO      . MEDLINE  mouth rinse  15 mL Mouth Rinse q12n4p Zenia Resides, MD   15 mL at 02/10/19 1604  . morphine 2 MG/ML injection 2 mg  2 mg Intravenous Q4H PRN Riccio, Angela C, DO      . sodium chloride 0.9 % bolus 500 mL  500 mL Intravenous Once Riccio, Angela C, DO      . vancomycin (VANCOCIN) 500 mg in sodium chloride 0.9 % 100 mL IVPB  500 mg Intravenous Q12H Riccio, Angela C, DO 100 mL/hr at 02/11/19 0510 500 mg at 02/11/19 0510     Discharge Medications: Please see discharge summary for a list of discharge medications.  Relevant Imaging Results:  Relevant Lab  Results:   Additional Information SSN: [id number]  Philippa Chester Mahlon Gabrielle, LCSWA

## 2019-02-11 NOTE — Progress Notes (Addendum)
NEUROLOGY PROGRESS NOTE  Subjective Patient up in chair on 2.5 L of oxygen. In soft mitten restraints. Some confusion still persist. Patient mumbles with speech and is hard to understand.   Exam: Vitals:   02/11/19 0500 02/11/19 0746  BP: (!) 177/62 (!) 188/74  Pulse: (!) 120 (!) 124  Resp:  (!) 22  Temp: 97.8 F (36.6 C) 98.2 F (36.8 C)  SpO2: 100% 96%    Physical Exam   HEENT-  Normocephalic, no lesions, without obvious abnormality.  Normal external eye and conjunctiva.  Extremely dry oral mucosa Extremities- Warm, dry and intact Musculoskeletal-no joint tenderness, deformity or swelling. Left arm in sling. Bilateral feet point inward. Skin-warm and dry, no hyperpigmentation, vitiligo, or suspicious lesions  Neurological Examination Mental Status: Alert, not oriented. Was able to tell me his name. Told me he was 37.  Garbled speech that is difficult to understand.  Able to follow simple commands without difficulty. Cranial Nerves: II: Visual fields grossly normal,  III,IV, VI: ptosis not present, extra-ocular motions intact bilaterally pupils equal, round, reactive to light and accommodation V,VII: smile symmetric, facial light touch sensation normal bilaterally VIII: hearing normal bilaterally IX,X: uvula rises symmetrically XI: bilateral shoulder shrug XII: midline tongue extension Motor: Right : Upper extremity   4/5  Left:     Upper extremity   Not tested d/ t pain ( left humerus fx.   Lower extremity   4/5    Lower extremity   4/5 Tone and bulk:normal tone throughout; no atrophy noted Sensory:  light touch intact throughout, bilaterally Deep Tendon Reflexes: 2+ and symmetric throughout Plantars: Right: upgoing   Left: upgoing Gait: deferred      Medications:  Scheduled: . chlorhexidine  15 mL Mouth Rinse BID  . lidocaine  10 mL Intradermal Once  . mouth rinse  15 mL Mouth Rinse q12n4p    Pertinent Labs/Diagnostics: Total CK 1988 HSV CSF by PCR  pending  Results for HARLIN, MAZZONI (MRN [id number]) as of 02/10/2019 10:33  Ref. Range 02/09/2019 22:45 02/09/2019 22:45  Appearance, CSF Latest Ref Range: CLEAR  CLEAR CLEAR  Glucose, CSF Latest Ref Range: 40 - 70 mg/dL 60   RBC Count, CSF Latest Ref Range: 0 /cu mm 270 (H) 200 (H)  WBC, CSF Latest Ref Range: 0 - 5 /cu mm 335 (HH) 355 (HH)  Segmented Neutrophils-CSF Latest Ref Range: 0 - 6 % 1   Lymphs, CSF Latest Ref Range: 40 - 80 % 93 (H) 95 (H)  Monocyte-Macrophage-Spinal Fluid Latest Ref Range: 15 - 45 % 6 (L) 5 (L)  Color, CSF Latest Ref Range: COLORLESS  YELLOW (A) YELLOW (A)  Supernatant Unknown XANTHOCHROMIC XANTHOCHROMIC  Total  Protein, CSF Latest Ref Range: 15 - 45 mg/dL 470 (H)   Tube # Unknown 4 1   CT Head and C-spine: 1. Edema throughout the mesial right temporal lobe. Considerations include acute to subacute infarct, infection, and tumor. This is more extensive than is typically seen with traumatic temporal lobe contusion. Brain MRI (preferably without and with contrast) is recommended for further evaluation. 2. Trace hemorrhage in both lateral ventricles. 3. No acute cervical spine fracture or subluxation.  MRI Brain: 1. Abnormal diffusion restriction within the mesial right temporal lobe, most consistent with acute or early subacute infarct. This distribution may also be seen in infection, particularly herpes encephalitis, though this seems less likely in this presentation, particularly if the patient is afebrile. 2. Trace intraventricular blood products within the occipital horns  rEEG  02/10/2019:  This EEG is consistent with a mild generalized nonspecific cerebral dysfunction (encephalopathy) with suggestion of more dysfunction on the right than left. There was no seizure or seizure predisposition recorded on this study. Please note that lack of epileptiform activity on EEG does not preclude the possibility of epilepsy.  Assessment: 63 year old male found on the  floor with multiple bruises and altered mental status.  His CSF findings are consistent with aseptic meningitis/encephalitis and his MRI shows medial temporal predilection which is fairly characteristic of HSV encephalitis.  He is on treatment for this, and at this time I think that this is by far the most likely etiology.  EEG showed no evidence of seizure, so will hold off on starting AED's at this time.   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 Neurology to sign-off at this time. Please call with any further questions or concerns.   Laurey Morale, MSN, NP-C [facility] [phone]   Attending Neurologist note to follow:   02/11/2019, 10:07 AM  I have seen the patient reviewed the above note.  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  Though more typically causing meningitis, HSV-2 can cause encephalitis as well.  Treatment I feel would still be acyclovir.  At this time, treatment consists of acyclovir and rehabilitation, I suspect that he is going to have a quite protracted course and prolonged convalescence.  Neurology will be available if the patient should develop seizures or should other questions arise.  Please call with any further questions or concerns.  Roland Rack, MD [facility] [phone]  If 7pm- 7am, please page neurology on call as listed in [place].

## 2019-02-11 NOTE — Progress Notes (Signed)
SLP Cancellation Note  Patient Details Name: Patrick Daniel MRN: [id number] DOB: 11-16-1955   Cancelled treatment:        SLP getting ready to enter room to determine swallow/po readiness. RN stated she spoke with Dr. Andria Frames about reassessing swallow and MD said he wanted to "give him one more day" then reevaluate. Reiterated frequent oral care with RN given copious debris present yesterday.Will follow up tomorrow.     Houston Siren 02/11/2019, 3:25 PM   [name] Colvin Caroli.Ed Risk analyst [phone] Office [phone]

## 2019-02-11 NOTE — Progress Notes (Signed)
Physical Therapy Treatment Patient Details Name: Patrick Daniel MRN: [id number] DOB: [birth year] Today's Date: 02/11/2019    History of Present Illness 63 y.o. male presenting with AMS after being found down at [facility]. PMH is significant for cervical dystonia, dysarthria, HLD, gait abnormality (?CNS degenerative disorder?). Patient unable to provide insight to what caused fall given chronic speech issues that have acutely worsened. Known mobility issues at baseline with frequent falls. Concern for traumatic head injury after fall vs stroke vs seizure. Head CT with temporal lobe edema. MRI with concern for acute or early subacute infarct vs infection like HSV. Pt also found to have comminuted fx of L humeral neck, ortho following.     PT Comments    Pt progressing towards physical therapy goals. He was able to tolerate more activity this session and sat EOB ~10 minutes, and was able to squat pivot transfer to drop-arm recliner with +2 assist. Pt was positioned in chair with pillow under LUE and behind R shoulder to maintain centered upright posture. Will continue to follow and progress as able per POC.    Follow Up Recommendations  SNF;Supervision/Assistance - 24 hour     Equipment Recommendations  Other (comment)(TBD by next venue of care)    Recommendations for Other Services       Precautions / Restrictions Precautions Precautions: Fall;Shoulder;Other (comment) Type of Shoulder Precautions: sling, NWB  Shoulder Interventions: Shoulder sling/immobilizer Precaution Comments: watch HR Required Braces or Orthoses: Sling Restrictions Weight Bearing Restrictions: Yes LUE Weight Bearing: Non weight bearing    Mobility  Bed Mobility Overal bed mobility: Needs Assistance Bed Mobility: Rolling;Sidelying to Sit Rolling: Max assist;+2 for physical assistance;+2 for safety/equipment Sidelying to sit: Max assist;+2 for physical assistance;+2 for safety/equipment       General bed mobility  comments: pt able to follow some commands and initiates moving LEs towards EOB; requires assist for positioning LLE to roll onto R side, assist for LEs over EOB and to elevate trunk; pt with poor sitting balance requiring assist to stabilize once upright  Transfers Overall transfer level: Needs assistance Equipment used: 2 person hand held assist Transfers: Squat Pivot Transfers;Sit to/from Stand Sit to Stand: Max assist;+2 physical assistance;+2 safety/equipment   Squat pivot transfers: Max assist;+2 physical assistance;+2 safety/equipment     General transfer comment: attempted sit<>stand from EOB - pt able to achieve partial stand from EOB with two person assist however unable to power through LEs to achieve full upright position due to LE weakness; performed squat pivot transfer to drop arm recliner, using two person assist and bed pad to assist with guiding hips  Ambulation/Gait                 Stairs             Wheelchair Mobility    Modified Rankin (Stroke Patients Only)       Balance Overall balance assessment: Needs assistance Sitting-balance support: Feet supported Sitting balance-Leahy Scale: Poor Sitting balance - Comments: requires overall modA for sitting balance; R lateral lean Postural control: Right lateral lean                                  Cognition Arousal/Alertness: Awake/alert(sleepy end of session due to fatigue) Behavior During Therapy: WFL for tasks assessed/performed;Flat affect Overall Cognitive Status: Impaired/Different from baseline Area of Impairment: Attention;Following commands;Safety/judgement;Awareness  Current Attention Level: Focused   Following Commands: Follows one step commands inconsistently;Follows one step commands with increased time Safety/Judgement: Decreased awareness of deficits Awareness: Intellectual   General Comments: pt able to answer yes/no questions; requires  multimodal cues to follow commands and to redirect to task at hand      Exercises Hand Exercises Wrist Flexion: AROM;Left;5 reps Wrist Extension: AROM;Left;5 reps Digit Composite Flexion: AROM;Left;5 reps Composite Extension: AROM;Left;5 reps    General Comments General comments (skin integrity, edema, etc.): HR fluctuating 128-131 with activity during session, on RA with SpO2 94-96%       Pertinent Vitals/Pain Pain Assessment: No/denies pain Faces Pain Scale: No hurt Pain Intervention(s): Monitored during session    Home Living                      Prior Function            PT Goals (current goals can now be found in the care plan section) Acute Rehab PT Goals Patient Stated Goal: none stated today, agreeable to working  with therapies PT Goal Formulation: Patient unable to participate in goal setting Time For Goal Achievement: 02/24/19 Potential to Achieve Goals: Good Progress towards PT goals: Progressing toward goals    Frequency    Min 3X/week      PT Plan Current plan remains appropriate    Co-evaluation PT/OT/SLP Co-Evaluation/Treatment: Yes Reason for Co-Treatment: For patient/therapist safety;To address functional/ADL transfers PT goals addressed during session: Mobility/safety with mobility;Balance OT goals addressed during session: ADL's and self-care;Strengthening/ROM      AM-PAC PT "6 Clicks" Mobility   Outcome Measure  Help needed turning from your back to your side while in a flat bed without using bedrails?: A Lot Help needed moving from lying on your back to sitting on the side of a flat bed without using bedrails?: A Lot Help needed moving to and from a bed to a chair (including a wheelchair)?: A Lot Help needed standing up from a chair using your arms (e.g., wheelchair or bedside chair)?: Total Help needed to walk in hospital room?: Total Help needed climbing 3-5 steps with a railing? : Total 6 Click Score: 9    End of Session  Equipment Utilized During Treatment: Oxygen Activity Tolerance: Patient limited by fatigue Patient left: in chair;with call bell/phone within reach;with chair alarm set Nurse Communication: Mobility status PT Visit Diagnosis: Muscle weakness (generalized) (M62.81);History of falling (Z91.81);Difficulty in walking, not elsewhere classified (R26.2);Pain Pain - Right/Left: Left Pain - part of body: Shoulder     Time: 9242-6834 PT Time Calculation (min) (ACUTE ONLY): 29 min  Charges:  $Therapeutic Exercise: 8-22 mins                     Rolinda Roan, PT, DPT Acute Rehabilitation Services Pager: [phone] Office: [phone]    Thelma Comp 02/11/2019, 2:04 PM

## 2019-02-12 DIAGNOSIS — G0490 Encephalitis and encephalomyelitis, unspecified: Secondary | ICD-10-CM

## 2019-02-12 DIAGNOSIS — B9689 Other specified bacterial agents as the cause of diseases classified elsewhere: Secondary | ICD-10-CM

## 2019-02-12 LAB — CULTURE, BLOOD (ROUTINE X 2)

## 2019-02-12 LAB — BASIC METABOLIC PANEL
Anion gap: 15 (ref 5–15)
BUN: 18 mg/dL (ref 6–20)
CO2: 22 mmol/L (ref 22–32)
Calcium: 8.7 mg/dL — ABNORMAL LOW (ref 8.9–10.3)
Chloride: 118 mmol/L — ABNORMAL HIGH (ref 98–111)
Creatinine, Ser: 0.87 mg/dL (ref 0.61–1.24)
GFR calc Af Amer: >60 mL/min (ref >60)
GFR calc non Af Amer: >60 mL/min (ref >60)
Glucose, Bld: 110 mg/dL — ABNORMAL HIGH (ref 70–99)
Potassium: 3.4 mmol/L — ABNORMAL LOW (ref 3.5–5.1)
Sodium: 155 mmol/L — ABNORMAL HIGH (ref 135–145)

## 2019-02-12 LAB — VANCOMYCIN, TROUGH: Vancomycin Tr: <4 ug/mL — ABNORMAL LOW (ref 15–20)

## 2019-02-12 MED ORDER — VANCOMYCIN HCL 1000 MG IV SOLR
1000.0000 mg | Freq: Two times a day (BID) | INTRAVENOUS | Status: DC
  Filled 2019-02-12: qty 1000

## 2019-02-12 MED ORDER — VANCOMYCIN HCL 10 G IV SOLR
1250.0000 mg | Freq: Once | INTRAVENOUS | Status: DC
  Filled 2019-02-12: qty 1250

## 2019-02-12 MED ORDER — KCL IN DEXTROSE-NACL 20-5-0.45 MEQ/L-%-% IV SOLN
INTRAVENOUS | Status: AC
  Administered 2019-02-12: 10:00:00 via INTRAVENOUS
  Filled 2019-02-12 (×3): qty 1000

## 2019-02-12 NOTE — Progress Notes (Signed)
Family Medicine Teaching Service Daily Progress Note Intern Pager: [phone]  Patient name: Patrick Daniel Medical record number: [id number] Date of birth: Jan 10, 1956 Age: 63 y.o. Gender: male  Primary Care Provider: Marco Collie, MD Consultants: neuro, ID, orthopedics (s/o) Code Status: FULL  Pt Overview and Major Events to Date:  4/15 admitted having been found down, AMS, neuro consulted, LP suggestive of HSV encephalitis, acyclovir started  Assessment and Plan: Patrick Daniel is a 63 y.o. male presenting with AMS after being found down. PMH is significant for cervical dystonia, dysarthria, HLD, gait abnormality, h/o myelopathy.  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 Last fever 4/16 pm and has not required antipyretics in last 24h. BCx showed 1 of 2 coag neg staph which could be contaminant. Repeat BCx  NG<24 hrs. - frequent neuro checks - continue acyclovir (4/16-) per neurology  -per ID, acyclovir for 21d total, continue vanc and repeat BCx - CSF cx: NG<12h - PT/OT recommending SNF  - SLP recommending NPO, will need reeval on 4/19 - am CBC, BMP  - APAP prn   Anemia: Hemoglobin stable at 7.3. Iron studies suggestive of anemia of chronic disease, retics likely elevated in the setting of acute illness.   -Daily CBC -may benefit from iron supplementation once acute infection has resolved  -may need transfusion ahead of surgical repair for shoulder if that is scheduled  Hypernatremia: likely 2/2 NPO and inadequate fluid intake.  -change IVF to D5 1/2NS  -recheck Na in am    Elevated BP with probably h/o HTN - start hydralazine IV 20mg  q8h schedule while NPO, could change to norvasc when taking po  Hypokalemia. K 3.4 - monitor BMP - K 20 mEq in MIVF    L humeral neck fracture- mildly comminuted fx of L humeral neck noticed w/ significant bruising to LUE secondary to fall at home. Patient denies pain this morning -ortho recommended sling for now, could consider reverse  total shoulder arthroplasty after clear of infection if still symptomatic at that time. Follow up outpt  O2 requirement, uncertain etiology. On RA now.  - continue to monitor  Cervical dystonia- chronic, stable- patient follows at [facility] for botox injections, last injection 12/01/18 - f/u outpt  Abnormal TSH. Likely due to significant acute medical illness - recheck as outpt  FEN/GI: NPO  Prophylaxis: SCDs, no pharm DVT ppx d/t blood in ventricles on MRI brain   Disposition: pending medical management  Subjective:  Pt answers yes no questions. Denies pain.   Objective: Temp:  [98.2 F (36.8 C)-102.9 F (39.4 C)] 100.1 F (37.8 C) (04/17 2012) Pulse Rate:  [115-129] 115 (04/17 2344) Resp:  [17-22] 18 (04/17 2344) BP: (140-202)/(60-112) 149/60 (04/17 2344) SpO2:  [96 %-99 %] 99 % (04/17 1618) Weight:  [65 kg] 65 kg (04/17 0746) Physical Exam:  General: Laying in bed with mitts on, in NAD Cardiovascular: Tachycardic, regular. Normal S1 and S2. No murmurs Respiratory: Clear to auscultation bilaterally, NWOB  Abdomen: Soft, nontender, nondistended, + bowel sounds  Extremities: No edema, left shoulder in sling Neuro: awake and alert. Follows simple commands. EOMI.   Laboratory: Recent Labs  Lab 02/10/19 0451 02/11/19 0342 02/11/19 1350  WBC 8.6 10.3 12.8*  HGB 7.7* 7.3* 7.9*  HCT 23.4* 22.5* 23.8*  PLT 392 348 373   Recent Labs  Lab 02/09/19 1529 02/10/19 0451 02/11/19 0342 02/12/19 0426  NA 138 145 149* 155*  K 4.6 4.0 3.4* 3.4*  CL 97* 110 117* 118*  CO2 21* 21* 21* 22  BUN 81* 46* 26* 18  CREATININE 1.38* 0.95 0.94 0.87  CALCIUM 9.4 8.4* 8.2* 8.7*  PROT 8.1  --  6.0*  --   BILITOT 1.9*  --  1.3*  --   ALKPHOS 68  --  55  --   ALT 46*  --  39  --   AST 54*  --  51*  --   GLUCOSE 143* 125* 108* 110*   Iron/TIBC/Ferritin/ %Sat    Component Value Date/Time   IRON 51 02/11/2019 0342   TIBC 230 (L) 02/11/2019 0342   FERRITIN 327  02/11/2019 1350   IRONPCTSAT 22 02/11/2019 0342    Imaging/Diagnostic Tests: Mr Jeri [employer] Contrast Result Date: 02/09/2019 IMPRESSION: 1. Abnormal diffusion restriction within the mesial right temporal lobe, most consistent with acute or early subacute infarct. This distribution may also be seen in infection, particularly herpes encephalitis, though this seems less likely in this presentation, particularly if the patient is afebrile. 2. Trace intraventricular blood products within the occipital horns of both lateral ventricles. Electronically Signed   By: Ulyses Jarred M.D.   On: 02/09/2019 20:34   Sela Hilding, MD 02/12/2019, 7:25 AM PGY-3, [place] Intern pager: [phone], text pages welcome

## 2019-02-12 NOTE — Progress Notes (Signed)
[name] for Infectious Disease  Date of Admission:  02/09/2019     Total days of antibiotics 3 Vancomycin, day 3 Acyclovir, day 3           Patient ID: Patrick Daniel is a 63 y.o. male with  Principal Problem:   Encephalitis Active Problems:   Altered mental status   AKI (acute kidney injury) ([place])   Dehydration   Fall   Traumatic rhabdomyolysis ([place])   Unspecified fracture of upper end of left humerus, initial encounter for closed fracture   . chlorhexidine  15 mL Mouth Rinse BID  . hydrALAZINE  20 mg Intravenous Q8H  . lidocaine  10 mL Intradermal Once  . mouth rinse  15 mL Mouth Rinse q12n4p    SUBJECTIVE: Shakes head no to questions inquiring about pain. Able to follow simple verbal commands. Shakes head no to headache. Pt attempting to speak more but speech often remains unintelligible. He remains unsafe to eat or drink thus far and remains in mittens to avoid accidentally d/c'ing his own lines.  Review of Systems: Review of Systems  Unable to perform ROS: Mental status change    Allergies  Allergen Reactions  . Baclofen Other (See Comments)    Weakness, drowsiness    OBJECTIVE: Vitals:   02/11/19 2344 02/12/19 0753 02/12/19 1130 02/12/19 1630  BP: (!) 149/60 140/85 (!) 158/76 (!) 147/65  Pulse: (!) 115 (!) 116 (!) 122 (!) 121  Resp: 18 20 20 19   Temp:  98.4 F (36.9 C) 98.5 F (36.9 C) (!) 101.1 F (38.4 C)  TempSrc:  Oral Axillary Axillary  SpO2:  95% 96% 97%  Weight:      Height:       Body mass index is 23.13 kg/m.  Physical Exam Constitutional:      Comments: Sitting up in bed - Alert. Shakes head no and yes appropriately. Attempts to verbalize but most speech is unintelligible.   HENT:     Mouth/Throat:     Mouth: Mucous membranes are dry.  Eyes:     General: No scleral icterus.    Pupils: Pupils are equal, round, and reactive to light.  Cardiovascular:     Rate and Rhythm: Tachycardia present.     Pulses: Normal pulses.      Heart sounds: No murmur.  Pulmonary:     Effort: Pulmonary effort is normal.     Breath sounds: Normal breath sounds.  Abdominal:     General: There is no distension.     Palpations: Abdomen is soft.  Skin:    General: Skin is warm and dry.     Capillary Refill: Capillary refill takes less than 2 seconds.     Comments: Multiple scabbed abrasions on knees. All healing w/o signs of infection.   Neurological:     Comments: Neurologically improving - able to shake head yes and no appropriately. Attempting to communicate more verbally. Still with expressive aphasia and difficult to understand speech, although it is more intelligible on occasion     Lab Results Lab Results  Component Value Date   WBC 12.8 (H) 02/11/2019   HGB 7.9 (L) 02/11/2019   HCT 23.8 (L) 02/11/2019   MCV 94.4 02/11/2019   PLT 373 02/11/2019    Lab Results  Component Value Date   CREATININE 0.87 02/12/2019   BUN 18 02/12/2019   NA 155 (H) 02/12/2019   K 3.4 (L) 02/12/2019   CL 118 (H) 02/12/2019  CO2 22 02/12/2019    Lab Results  Component Value Date   ALT 39 02/11/2019   AST 51 (H) 02/11/2019   ALKPHOS 55 02/11/2019   BILITOT 1.3 (H) 02/11/2019     Microbiology: Recent Results (from the past 240 hour(s))  Blood culture (routine x 2)     Status: Abnormal   Collection Time: 02/09/19  5:08 PM  Result Value Ref Range Status   Specimen Description BLOOD RIGHT ANTECUBITAL  Final   Special Requests   Final    BOTTLES DRAWN AEROBIC AND ANAEROBIC Blood Culture results may not be optimal due to an inadequate volume of blood received in culture bottles   Culture  Setup Time   Final    GRAM POSITIVE COCCI IN CLUSTERS IN BOTH AEROBIC AND ANAEROBIC BOTTLES CRITICAL RESULT CALLED TO, READ BACK BY AND VERIFIED WITH: PHARMD ERIN DEJA AT 5883 ON 02/10/2019 BY MHOUEGNIFIO    Culture (A)  Final    STAPHYLOCOCCUS SPECIES (COAGULASE NEGATIVE) THE SIGNIFICANCE OF ISOLATING THIS ORGANISM FROM A SINGLE SET OF  BLOOD CULTURES WHEN MULTIPLE SETS ARE DRAWN IS UNCERTAIN. PLEASE NOTIFY THE MICROBIOLOGY DEPARTMENT WITHIN ONE WEEK IF SPECIATION AND SENSITIVITIES ARE REQUIRED. Performed at [facility], [place] [address] [place], [place] [address]    Report Status 02/12/2019 FINAL  Final  Blood Culture ID Panel (Reflexed)     Status: Abnormal   Collection Time: 02/09/19  5:08 PM  Result Value Ref Range Status   Enterococcus species NOT DETECTED NOT DETECTED Final   Listeria monocytogenes NOT DETECTED NOT DETECTED Final   Staphylococcus species DETECTED (A) NOT DETECTED Final    Comment: Methicillin (oxacillin) resistant coagulase negative staphylococcus. Possible blood culture contaminant (unless isolated from more than one blood culture draw or clinical case suggests pathogenicity). No antibiotic treatment is indicated for blood  culture contaminants. CRITICAL RESULT CALLED TO, READ BACK BY AND VERIFIED WITH: PHARMD ERIN DEJA AT 1332 ON 02/10/2019 BY MHOUEGNIFIO    Staphylococcus aureus (BCID) NOT DETECTED NOT DETECTED Final   Methicillin resistance DETECTED (A) NOT DETECTED Final    Comment: CRITICAL RESULT CALLED TO, READ BACK BY AND VERIFIED WITH: PHARMD ERIN DEJA AT 1332 ON 02/10/2019 BY MHOUEGNIFIO    Streptococcus species NOT DETECTED NOT DETECTED Final   Streptococcus agalactiae NOT DETECTED NOT DETECTED Final   Streptococcus pneumoniae NOT DETECTED NOT DETECTED Final   Streptococcus pyogenes NOT DETECTED NOT DETECTED Final   Acinetobacter baumannii NOT DETECTED NOT DETECTED Final   Enterobacteriaceae species NOT DETECTED NOT DETECTED Final   Enterobacter cloacae complex NOT DETECTED NOT DETECTED Final   Escherichia coli NOT DETECTED NOT DETECTED Final   Klebsiella oxytoca NOT DETECTED NOT DETECTED Final   Klebsiella pneumoniae NOT DETECTED NOT DETECTED Final   Proteus species NOT DETECTED NOT DETECTED Final   Serratia marcescens NOT DETECTED NOT DETECTED Final   Haemophilus influenzae  NOT DETECTED NOT DETECTED Final   Neisseria meningitidis NOT DETECTED NOT DETECTED Final   Pseudomonas aeruginosa NOT DETECTED NOT DETECTED Final   Candida albicans NOT DETECTED NOT DETECTED Final   Candida glabrata NOT DETECTED NOT DETECTED Final   Candida krusei NOT DETECTED NOT DETECTED Final   Candida parapsilosis NOT DETECTED NOT DETECTED Final   Candida tropicalis NOT DETECTED NOT DETECTED Final    Comment: Performed at [facility], [place]. [address], [place],  [address]  Blood culture (routine x 2)     Status: None (Preliminary result)   Collection Time: 02/09/19  5:15 PM  Result Value Ref Range Status   Specimen Description BLOOD LEFT ANTECUBITAL  Final   Special Requests   Final    BOTTLES DRAWN AEROBIC AND ANAEROBIC Blood Culture adequate volume   Culture   Final    NO GROWTH 3 DAYS Performed at [facility], [address] [place], [place] [address]    Report Status PENDING  Incomplete  CSF culture     Status: None (Preliminary result)   Collection Time: 02/09/19 10:45 PM  Result Value Ref Range Status   Specimen Description CSF  Final   Special Requests Normal  Final   Gram Stain   Final    WBC PRESENT, PREDOMINANTLY MONONUCLEAR NO ORGANISMS SEEN CYTOSPIN SMEAR    Culture   Final    NO GROWTH 2 DAYS Performed at [facility], [place] [address], [place], [place] [address]    Report Status PENDING  Incomplete  Culture, fungus without smear     Status: None (Preliminary result)   Collection Time: 02/09/19 10:45 PM  Result Value Ref Range Status   Specimen Description CSF  Final   Special Requests Normal  Final   Culture   Final    NO FUNGUS ISOLATED AFTER 3 DAYS Performed at [facility], [place] [address] [place], [place] [address]    Report Status PENDING  Incomplete  Anaerobic culture     Status: None (Preliminary result)   Collection Time: 02/09/19 10:45 PM  Result Value Ref Range Status   Specimen Description CSF  Final   Special  Requests Normal  Final   Gram Stain PENDING  Incomplete   Culture   Final    NO GROWTH 2 DAYS Performed at [facility], [place] [address] [place], [place] [address]    Report Status PENDING  Incomplete  Culture, blood (routine x 2)     Status: None (Preliminary result)   Collection Time: 02/11/19  9:01 AM  Result Value Ref Range Status   Specimen Description BLOOD RIGHT ANTECUBITAL  Final   Special Requests   Final    BOTTLES DRAWN AEROBIC ONLY Blood Culture adequate volume   Culture   Final    NO GROWTH < 24 HOURS Performed at [facility], [place] [address], [place], [place] [address]    Report Status PENDING  Incomplete  Culture, blood (routine x 2)     Status: None (Preliminary result)   Collection Time: 02/11/19  9:01 AM  Result Value Ref Range Status   Specimen Description BLOOD RIGHT ARM  Final   Special Requests   Final    BOTTLES DRAWN AEROBIC ONLY Blood Culture adequate volume   Culture   Final    NO GROWTH < 24 HOURS Performed at [facility], [place] [address], [place], [place] [address]    Report Status PENDING  Incomplete     ASSESSMENT & PLAN:  1. Encephalitis= Seems to be improving nicely with acyclovir therapy. 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 Continue IV acyclovir therapy for now. Once he can safely swallow pills and resumes some of his speech capabilities, he may be a candidate to complete the remainder of his 3 weeks of treatment with PO valtrex. Given the advanced clinical nature of his infection, would expect to begin to see more noticeable speech patterns by treatment day #5-6. As he presented rather late, would remain vigilant for seizures or secondary  brain abscess as both are known complications of HSV encephalitis.  2. 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 Given his  multiple open lesions may be transient bacteremia vs skin contaminant from draw. Repeat blood cxs are unrevealing thus far. Fever curve continues to improve. Continue vancomycin for now.   3. Medication Monitoring = Creatinine stable 0.87. Check BMP daily while on IV acyclovir given risk for crystal-induced nephropathy with medication.  4. Rhabdo/AKI = per primary team, CK slowly improving - 1220 yesterday, down from 1605 on admission.   Catalina Antigua, MD  02/12/2019  7:01 PM

## 2019-02-12 NOTE — Progress Notes (Signed)
Pharmacy Antibiotic Note  Patrick Daniel is a 63 y.o. male admitted on 02/09/2019 with sepsis.  Pharmacy has been consulted for vancomycin dosing for possible CoNS bacteremia. Also on acyclovir for HSV encephalitis.  Vancomycin trough is undetectable at <4. Renal function improved from admit. Tm 101.1, WBC are elevated at 12.8.  Plan: Vancomycin 1250 mg IV x 1, then 1000mg  IV every 12 hours Acyclovir 650 mg IV q8h (~10 mg/kg/dose) Monitor renal function, Cx and clinical progression to narrow Vancomycin trough at steady state - consider d/c   Height: 5\' 6"  (167.6 cm) Weight: 143 lb 4.8 oz (65 kg) IBW/kg (Calculated) : 63.8  Temp (24hrs), Avg:99.5 F (37.5 C), Min:98.4 F (36.9 C), Max:101.1 F (38.4 C)  Recent Labs  Lab 02/09/19 1529 02/09/19 1714 02/10/19 0451 02/10/19 1048 02/10/19 1354 02/11/19 0342 02/11/19 1350 02/12/19 0426 02/12/19 1600  WBC 12.4*  --  8.6  --   --  10.3 12.8*  --   --   CREATININE 1.38*  --  0.95  --   --  0.94  --  0.87  --   LATICACIDVEN 3.4* 2.0*  --  1.4 1.3  --   --   --   --   VANCOTROUGH  --   --   --   --   --   --   --   --  <4*    Estimated Creatinine Clearance: 81.5 mL/min (by C-G formula based on SCr of 0.87 mg/dL).    Allergies  Allergen Reactions  . Baclofen Other (See Comments)    Weakness, drowsiness    Antimicrobials this admission: Vanc 4/15>> Cefepime 4/15>>4/16 Acyclovir 4/15 >>  Dose adjustments this admission: 4/18: VT <4, load and incr to 1g q12h  Microbiology results: 4/15 BCx: CoNS 4/15 LP: HSV2+ per ID note 4/17   Renold Genta, PharmD, BCPS Clinical Pharmacist Clinical phone for 02/12/2019 until 10p is [phone] 02/12/2019 6:29 PM  **Pharmacist phone directory can now be found on [email or web address] listed under [place]**

## 2019-02-12 NOTE — Progress Notes (Signed)
  Speech Language Pathology Treatment: Dysphagia  Patient Details Name: Patrick Daniel MRN: [id number] DOB: 09-21-1956 Today's Date: 02/12/2019 Time: 8469-6295 SLP Time Calculation (min) (ACUTE ONLY): 25 min  Assessment / Plan / Recommendation Clinical Impression  Patient seen to address dysphagia goals with trials of ice chips following significant amount of oral care completed mostly by RN prior to SLP entering room. Patient continues to exhibit dried, reddish secretions throughout oral cavity, stuck on teeth, hard palate, etc. Patient is extremely dysarthric and appears to be confused as well. Patient is impulsive and will try to pull out IV's, attempted to take condom catheter off when SLP had removed gloves to see if he could write on dry erase board. Patient did masticate and orally manipulate ice chips but with delayed swallows, laryngeal pumping, multiple swallows and incomplete laryngeal elevation. Patient is not ready for MBS yet but this will be necessary prior to PO recommendations.   HPI HPI: Patrick Daniel is a 63 y.o. male presenting with history of paraplegia, myelopathy, dysarthria, cervical dystonia, (?CNS degenerative disorder )admitted from indpendendent living facility with AMS after being found down. 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 This distribution may also be seen in infection, particularly herpes encephalitis, though this seems less likely in this presentation. Sustained  aleft humerus fx, rhabdomyolsis. 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      SLP Plan  Continue with current plan of care;MBS(MBS when ready)       Recommendations  Diet recommendations: NPO Medication Administration: Via alternative means                Oral Care Recommendations: Oral care QID Follow up Recommendations: Skilled Nursing facility SLP Visit Diagnosis:  Dysphagia, unspecified (R13.10) Plan: Continue with current plan of care;MBS(MBS when ready)       GO                Patrick Daniel 02/12/2019, 5:57 PM    Sonia Baller, MA, [facility] Pager: [phone]

## 2019-02-12 NOTE — Progress Notes (Signed)
Family Medicine Teaching Service Daily Progress Note Intern Pager: [phone]  Patient name: Patrick Daniel Medical record number: [id number] Date of birth: [birth year] Age: 63 y.o. Gender: male  Primary Care Provider: Marco Collie, MD Consultants: neuro, ID, orthopedics (s/o) Code Status: FULL  Pt Overview and Major Events to Date:  4/15 admitted having been found down, AMS, neuro consulted, LP suggestive of HSV encephalitis, acyclovir started, 1/2 Blood cx positive for Coag neg Staph, CSF cx NGTD, CSF fungal NGTD, Anaerobic cx NGTD 4/16 Cefepime discontinued  4/17 Repeat blood cultures  4/18 Vanc Discontinued  Antimicrobials: Vancomycin 4/15-4/18 Cefepime 4/15-4/16 Acyclovir 4/15-  Assessment and Plan: Patrick Daniel is a 63 y.o. male presenting with AMS after being found down. PMH is significant for cervical dystonia, dysarthria, HLD, gait abnormality, h/o myelopathy.  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  Confirmed with CSF HSV PCR. Mental status improving since admission. Febrile to 101.1 on 4/18, relieved with tylenol. S/p Vanc and Cefepime for 1/2 Coag neg Staph, likely contaminant vs transient bacteremia. Repeat blood cx negative <24h. CSF cx neg x2d.  Given improvement in mental status, repeat speech eval today. NPO pending SLP recs. - frequent neuro checks - continue acyclovir x 21 days (4/16-) per neurology and ID - PT/OT recommending SNF  - F/u SLP recs, reeval 4/19 - Tylenol PRN for fever/pain - AM CBC, BMP  Anemia: Hemoglobin improved at 7.3>8.3. Iron studies suggestive of anemia of chronic disease, retics likely elevated in the setting of acute illness.   -Daily CBC -may benefit from iron supplementation once acute infection has resolved   AKI Cr 0.87>1.29 this am. Likely prerenal given NPO, although could be due to acyclovir, dose now adjusted per pharm. - recheck am Cr - continue IVF  Hypernatremia:  Na 155. Likely 2/2 NPO and inadequate fluid intake.  -Change IVF to D5  1/4NS  -recheck Na in am    Elevated BP with probably h/o HTN Intermittently hypertensive, normotensive this am. - continue hydralazine IV 20mg  q8h schedule while NPO, could change to norvasc when taking po   Hypokalemia: Improved K 3.4>3.5 s/p 30mEq K in MIVF.  - monitor BMP  - continue K 20 mEq in MIVF    L humeral neck fracture- mildly comminuted fx of L humeral neck noticed w/ significant bruising to LUE secondary to fall at home. Patient denies pain this morning. - ortho recommended sling for now, could consider reverse total shoulder arthroplasty after clear of infection if still symptomatic at that time. Follow up outpt - PRN tylenol, monitor for pain  Cervical dystonia- chronic, stable- patient follows at [facility] for botox injections, last injection 12/01/18 - f/u outpt  Abnormal TSH. Likely due to significant acute medical illness - recheck as outpt  O2 requirement, uncertain etiology: resolved, On RA now.  - continue to monitor  FEN/GI: NPO  Prophylaxis: SCDs, no pharm DVT ppx d/t blood in ventricles on MRI brain   Disposition: pending medical management  Subjective:  Patient very talkative this morning, however speech incomprehensible.  Is able to communicate yes or no.  Denies pain.   Objective: Temp:  [98.5 F (36.9 C)-101.1 F (38.4 C)] 100.1 F (37.8 C) (04/19 0741) Pulse Rate:  [114-124] 124 (04/19 0741) Resp:  [16-20] 18 (04/19 0741) BP: (108-163)/(59-76) 123/63 (04/19 0741) SpO2:  [96 %-98 %] 97 % (04/19 0741)   Physical Exam:  General: Laying in bed with mitts on, in NAD Cardiovascular: Slightly tachycardic, regular rhythm.  Normal S1 and S2. No murmurs  Respiratory: Normal work of breathing with appropriate saturations on room air.  Clear to auscultation bilaterally. Abdomen: Soft, nontender, nondistended, + bowel sounds  Extremities: Significant bruising to left shoulder and upper arm with mild swelling.  Palpable left radial  pulse.  Bilateral lower extremities without swelling or erythema. Neuro: awake and alert.  Laboratory: Recent Labs  Lab 02/11/19 0342 02/11/19 1350 02/13/19 0657  WBC 10.3 12.8* 13.8*  HGB 7.3* 7.9* 8.3*  HCT 22.5* 23.8* 25.4*  PLT 348 373 357   Recent Labs  Lab 02/09/19 1529  02/11/19 0342 02/12/19 0426 02/13/19 0435  NA 138   < > 149* 155* 155*  K 4.6   < > 3.4* 3.4* 3.5  CL 97*   < > 117* 118* 120*  CO2 21*   < > 21* 22 24  BUN 81*   < > 26* 18 27*  CREATININE 1.38*   < > 0.94 0.87 1.29*  CALCIUM 9.4   < > 8.2* 8.7* 8.5*  PROT 8.1  --  6.0*  --   --   BILITOT 1.9*  --  1.3*  --   --   ALKPHOS 68  --  55  --   --   ALT 46*  --  39  --   --   AST 54*  --  51*  --   --   GLUCOSE 143*   < > 108* 110* 163*   < > = values in this interval not displayed.   Iron/TIBC/Ferritin/ %Sat    Component Value Date/Time   IRON 51 02/11/2019 0342   TIBC 230 (L) 02/11/2019 0342   FERRITIN 327 02/11/2019 1350   IRONPCTSAT 22 02/11/2019 0342    Imaging/Diagnostic Tests: No results found.  Rory Percy, DO 02/13/2019, 8:53 AM PGY-2, [place] Intern pager: [phone], text pages welcome

## 2019-02-13 ENCOUNTER — Inpatient Hospital Stay (HOSPITAL_COMMUNITY): Payer: Medicare Other

## 2019-02-13 DIAGNOSIS — D72829 Elevated white blood cell count, unspecified: Secondary | ICD-10-CM

## 2019-02-13 DIAGNOSIS — E870 Hyperosmolality and hypernatremia: Secondary | ICD-10-CM

## 2019-02-13 DIAGNOSIS — B009 Herpesviral infection, unspecified: Secondary | ICD-10-CM

## 2019-02-13 DIAGNOSIS — B1009 Other human herpesvirus encephalitis: Secondary | ICD-10-CM

## 2019-02-13 LAB — CBC
HCT: 25.4 % — ABNORMAL LOW (ref 39.0–52.0)
Hemoglobin: 8.3 g/dL — ABNORMAL LOW (ref 13.0–17.0)
MCH: 31.4 pg (ref 26.0–34.0)
MCHC: 32.7 g/dL (ref 30.0–36.0)
MCV: 96.2 fL (ref 80.0–100.0)
Platelets: 357 10*3/uL (ref 150–400)
RBC: 2.64 MIL/uL — ABNORMAL LOW (ref 4.22–5.81)
RDW: 15.2 % (ref 11.5–15.5)
WBC: 13.8 10*3/uL — ABNORMAL HIGH (ref 4.0–10.5)
nRBC: 1 % — ABNORMAL HIGH (ref 0.0–0.2)

## 2019-02-13 LAB — BASIC METABOLIC PANEL
Anion gap: 11 (ref 5–15)
Anion gap: 8 (ref 5–15)
Anion gap: 8 (ref 5–15)
BUN: 27 mg/dL — ABNORMAL HIGH (ref 6–20)
BUN: 35 mg/dL — ABNORMAL HIGH (ref 6–20)
BUN: 43 mg/dL — ABNORMAL HIGH (ref 6–20)
CO2: 24 mmol/L (ref 22–32)
CO2: 24 mmol/L (ref 22–32)
CO2: 25 mmol/L (ref 22–32)
Calcium: 8.5 mg/dL — ABNORMAL LOW (ref 8.9–10.3)
Calcium: 8.4 mg/dL — ABNORMAL LOW (ref 8.9–10.3)
Calcium: 8.4 mg/dL — ABNORMAL LOW (ref 8.9–10.3)
Chloride: 120 mmol/L — ABNORMAL HIGH (ref 98–111)
Chloride: 125 mmol/L — ABNORMAL HIGH (ref 98–111)
Chloride: 119 mmol/L — ABNORMAL HIGH (ref 98–111)
Creatinine, Ser: 1.29 mg/dL — ABNORMAL HIGH (ref 0.61–1.24)
Creatinine, Ser: 2.23 mg/dL — ABNORMAL HIGH (ref 0.61–1.24)
Creatinine, Ser: 2.84 mg/dL — ABNORMAL HIGH (ref 0.61–1.24)
GFR calc Af Amer: >60 mL/min (ref >60)
GFR calc Af Amer: 36 mL/min — ABNORMAL LOW (ref >60)
GFR calc Af Amer: 27 mL/min — ABNORMAL LOW (ref >60)
GFR calc non Af Amer: 60 mL/min — ABNORMAL LOW (ref >60)
GFR calc non Af Amer: 31 mL/min — ABNORMAL LOW (ref >60)
GFR calc non Af Amer: 23 mL/min — ABNORMAL LOW (ref >60)
Glucose, Bld: 163 mg/dL — ABNORMAL HIGH (ref 70–99)
Glucose, Bld: 150 mg/dL — ABNORMAL HIGH (ref 70–99)
Glucose, Bld: 182 mg/dL — ABNORMAL HIGH (ref 70–99)
Potassium: 3.5 mmol/L (ref 3.5–5.1)
Potassium: 3.8 mmol/L (ref 3.5–5.1)
Potassium: 3.8 mmol/L (ref 3.5–5.1)
Sodium: 155 mmol/L — ABNORMAL HIGH (ref 135–145)
Sodium: 157 mmol/L — ABNORMAL HIGH (ref 135–145)
Sodium: 152 mmol/L — ABNORMAL HIGH (ref 135–145)

## 2019-02-13 LAB — CSF CULTURE W GRAM STAIN
Culture: NO GROWTH
Special Requests: NORMAL

## 2019-02-13 LAB — GLUCOSE, CAPILLARY: Glucose-Capillary: 150 mg/dL — ABNORMAL HIGH (ref 70–99)

## 2019-02-13 MED ORDER — DEXTROSE 5 % IV SOLN
5.0000 mg/kg | INTRAVENOUS | Status: DC
  Filled 2019-02-13: qty 6.5

## 2019-02-13 MED ORDER — LINEZOLID 600 MG/300ML IV SOLN
600.0000 mg | Freq: Two times a day (BID) | INTRAVENOUS | Status: AC
  Administered 2019-02-13 – 2019-02-15 (×6): 600 mg via INTRAVENOUS
  Filled 2019-02-13 (×7): qty 300

## 2019-02-13 MED ORDER — KCL IN DEXTROSE-NACL 20-5-0.2 MEQ/L-%-% IV SOLN
INTRAVENOUS | Status: DC
  Administered 2019-02-13: 15:00:00 via INTRAVENOUS
  Filled 2019-02-13 (×2): qty 1000

## 2019-02-13 MED ORDER — JEVITY 1.2 CAL PO LIQD
1000.0000 mL | ORAL | Status: DC
  Administered 2019-02-14: 1000 mL
  Filled 2019-02-13 (×4): qty 1000

## 2019-02-13 MED ORDER — KCL IN DEXTROSE-NACL 20-5-0.45 MEQ/L-%-% IV SOLN: INTRAVENOUS | Status: DC

## 2019-02-13 MED ORDER — DEXTROSE 5 % IV SOLN
10.0000 mg/kg | INTRAVENOUS | Status: DC
  Administered 2019-02-14 – 2019-02-17 (×4): 650 mg via INTRAVENOUS
  Filled 2019-02-13 (×5): qty 13

## 2019-02-13 NOTE — Progress Notes (Signed)
Modified Barium Swallow Progress Note  Patient Details  Name: Patrick Daniel MRN: [id number] Date of Birth: Apr 11, 1956  Today's Date: 02/13/2019  Modified Barium Swallow completed.  Full report located under Chart Review in the Imaging Section.  Brief recommendations include the following:  Clinical Impression  A severe oral and moderate pharyngeal dysphagia was exhibited, likely of multifactorial etiology. Pt unsafe for PO at this time, short term alternative nutrition recommendations were discussed with Dr. Ky Barban.   Pt with extremtly poor oral hygiene, nursing notes despite oral care attempts, buildup persists. Discussed possibility of adding  external humidification to assist with moisture/prevent dry secretion buildup with MD.  Copious dried palatal secretions were extracted by SLP prior to exam.  Oral deficits include poor bolus control, ineffective bolus transit, lingual pumping, anterior spillage, and oral residuals.   Pharyngeal deficits c/b reduced base of tongue retraction, decreased pharyngeal constriction, reduced timing and efficiency of laryngeal vestibule closure, reduced UES opening, and decreased laryngeal elevation resulting in:  -episodic penetration of thin, nectar thick, and  honey thick liquids during and after the swallow. Moderate residuals noted in the valleculae and pyriform sinuses, increasing risk for aspiraiton. SLP to follow up for dysphagia intervention   Swallow Evaluation Recommendations       SLP Diet Recommendations: NPO;Alternative means - temporary       Medication Administration: Via alternative means               Oral Care Recommendations: Oral care QID(external humidification to reduce dried oropharyngeal secret)        Patrick Daniel E [name] MA, CCC-SLP Acute Rehab Speech Language Pathologist  02/13/2019,4:12 PM

## 2019-02-13 NOTE — Progress Notes (Signed)
CSW attempted to give bed offers to the patients sister. CSW called and left the patient's sister a voicemail asking for a call back to go over bed offers.   Currently the patient only has one bed offer.   CSW will continue to assist with disposition.   Domenic Schwab, MSW, [place]

## 2019-02-13 NOTE — Progress Notes (Addendum)
Pharmacy Antibiotic Note  Patrick Daniel is a 63 y.o. 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  Pharmacy has been consulted for acyclovir IV dosing.  Patient has now developed an AKI (possibly due to acyclovir IV therapy). Will decrease acyclovir to dosing for AKI (Scr 0.87>>1.29>>2.23).   D/w ID. Will dose 10mg /kg IV q24h.  Plan: Acyclovir 650 mg IV q24h Monitor renal function and LOT.   Height: 5\' 6"  (167.6 cm) Weight: 143 lb 4.8 oz (65 kg) IBW/kg (Calculated) : 63.8  Temp (24hrs), Avg:99.7 F (37.6 C), Min:98.5 F (36.9 C), Max:101.1 F (38.4 C)  Recent Labs  Lab 02/09/19 1529 02/09/19 1714 02/10/19 0451 02/10/19 1048 02/10/19 1354 02/11/19 0342 02/11/19 1350 02/12/19 0426 02/12/19 1600 02/13/19 0435 02/13/19 0657 02/13/19 1257  WBC 12.4*  --  8.6  --   --  10.3 12.8*  --   --   --  13.8*  --   CREATININE 1.38*  --  0.95  --   --  0.94  --  0.87  --  1.29*  --  2.23*  LATICACIDVEN 3.4* 2.0*  --  1.4 1.3  --   --   --   --   --   --   --   VANCOTROUGH  --   --   --   --   --   --   --   --  <4*  --   --   --     Estimated Creatinine Clearance: 31.8 mL/min (A) (by C-G formula based on SCr of 2.23 mg/dL (H)).    Allergies  Allergen Reactions  . Baclofen Other (See Comments)    Weakness, drowsiness   Aluel Schwarz A. Levada Dy, PharmD, [place] Please utilize Amion for appropriate phone number to reach the unit pharmacist ([place])     02/13/2019 1:35 PM

## 2019-02-13 NOTE — Progress Notes (Signed)
[facility] for Infectious Disease  Date of Admission:  02/09/2019     Total days of antibiotics 4 Vancomycin, day 3; starting zyvox today Acyclovir, day 4           Patient ID: Patrick Daniel is a 63 y.o. male with  Principal Problem:   Encephalitis Active Problems:   Altered mental status   AKI (acute kidney injury) ([place])   Dehydration   Fall   Traumatic rhabdomyolysis ([place])   Unspecified fracture of upper end of left humerus, initial encounter for closed fracture    chlorhexidine  15 mL Mouth Rinse BID   hydrALAZINE  20 mg Intravenous Q8H   lidocaine  10 mL Intradermal Once   mouth rinse  15 mL Mouth Rinse q12n4p    SUBJECTIVE: Shakes head no to questions inquiring about pain. Able to follow simple verbal commands. Developed ARF o/n. FM team opted to d/c vanc. Na level rising, so fluids changed to D51/2 NS. He remains unsafe to eat or drink thus far and remains in mittens to avoid accidentally d/c'ing his own lines. Failed swallow study and now primary team planning to place NGT.  Review of Systems: Review of Systems  Unable to perform ROS: Mental status change    Allergies  Allergen Reactions   Baclofen Other (See Comments)    Weakness, drowsiness    OBJECTIVE: Vitals:   02/12/19 2323 02/13/19 0426 02/13/19 0741 02/13/19 1218  BP: (!) 108/59 (!) 160/69 123/63 123/60  Pulse: (!) 114 (!) 116 (!) 124 (!) 102  Resp: 18 18 18 18   Temp: 99.1 F (37.3 C) 99.2 F (37.3 C) 100.1 F (37.8 C) 99.5 F (37.5 C)  TempSrc: Oral Oral Axillary Axillary  SpO2:  98% 97% 97%  Weight:      Height:       Body mass index is 23.13 kg/m.  Physical Exam Constitutional:      Comments: Sitting up in bed - Alert. Shakes head no and yes appropriately. Attempts to verbalize but most speech is unintelligible.   HENT:     Mouth/Throat:     Mouth: Mucous membranes are dry.  Eyes:     General: No scleral icterus.    Pupils: Pupils are equal, round, and reactive  to light.  Cardiovascular:     Rate and Rhythm: Tachycardia present.     Pulses: Normal pulses.     Heart sounds: No murmur.  Pulmonary:     Effort: Pulmonary effort is normal.     Breath sounds: Normal breath sounds.  Abdominal:     General: There is no distension.     Palpations: Abdomen is soft.  Skin:    General: Skin is warm and dry.     Capillary Refill: Capillary refill takes less than 2 seconds.     Comments: Multiple scabbed abrasions on knees. All healing w/o signs of infection.   Neurological:     Comments: Neurologically improving - able to shake head yes and no appropriately. Attempting to communicate more verbally. Still with expressive aphasia and difficult to understand speech, although it is more intelligible on occasion     Lab Results Lab Results  Component Value Date   WBC 13.8 (H) 02/13/2019   HGB 8.3 (L) 02/13/2019   HCT 25.4 (L) 02/13/2019   MCV 96.2 02/13/2019   PLT 357 02/13/2019    Lab Results  Component Value Date   CREATININE 2.23 (H) 02/13/2019   BUN 35 (H)  02/13/2019   NA 157 (H) 02/13/2019   K 3.8 02/13/2019   CL 125 (H) 02/13/2019   CO2 24 02/13/2019    Lab Results  Component Value Date   ALT 39 02/11/2019   AST 51 (H) 02/11/2019   ALKPHOS 55 02/11/2019   BILITOT 1.3 (H) 02/11/2019     Microbiology: Recent Results (from the past 240 hour(s))  Blood culture (routine x 2)     Status: Abnormal   Collection Time: 02/09/19  5:08 PM  Result Value Ref Range Status   Specimen Description BLOOD RIGHT ANTECUBITAL  Final   Special Requests   Final    BOTTLES DRAWN AEROBIC AND ANAEROBIC Blood Culture results may not be optimal due to an inadequate volume of blood received in culture bottles   Culture  Setup Time   Final    GRAM POSITIVE COCCI IN CLUSTERS IN BOTH AEROBIC AND ANAEROBIC BOTTLES CRITICAL RESULT CALLED TO, READ BACK BY AND VERIFIED WITH: PHARMD ERIN DEJA AT 4403 ON 02/10/2019 BY MHOUEGNIFIO    Culture (A)  Final     STAPHYLOCOCCUS SPECIES (COAGULASE NEGATIVE) THE SIGNIFICANCE OF ISOLATING THIS ORGANISM FROM A SINGLE SET OF BLOOD CULTURES WHEN MULTIPLE SETS ARE DRAWN IS UNCERTAIN. PLEASE NOTIFY THE MICROBIOLOGY DEPARTMENT WITHIN ONE WEEK IF SPECIATION AND SENSITIVITIES ARE REQUIRED. Performed at [facility], [place] [address], [place], [place] [address]    Report Status 02/12/2019 FINAL  Final  Blood Culture ID Panel (Reflexed)     Status: Abnormal   Collection Time: 02/09/19  5:08 PM  Result Value Ref Range Status   Enterococcus species NOT DETECTED NOT DETECTED Final   Listeria monocytogenes NOT DETECTED NOT DETECTED Final   Staphylococcus species DETECTED (A) NOT DETECTED Final    Comment: Methicillin (oxacillin) resistant coagulase negative staphylococcus. Possible blood culture contaminant (unless isolated from more than one blood culture draw or clinical case suggests pathogenicity). No antibiotic treatment is indicated for blood  culture contaminants. CRITICAL RESULT CALLED TO, READ BACK BY AND VERIFIED WITH: PHARMD ERIN DEJA AT 1332 ON 02/10/2019 BY MHOUEGNIFIO    Staphylococcus aureus (BCID) NOT DETECTED NOT DETECTED Final   Methicillin resistance DETECTED (A) NOT DETECTED Final    Comment: CRITICAL RESULT CALLED TO, READ BACK BY AND VERIFIED WITH: PHARMD ERIN DEJA AT 1332 ON 02/10/2019 BY MHOUEGNIFIO    Streptococcus species NOT DETECTED NOT DETECTED Final   Streptococcus agalactiae NOT DETECTED NOT DETECTED Final   Streptococcus pneumoniae NOT DETECTED NOT DETECTED Final   Streptococcus pyogenes NOT DETECTED NOT DETECTED Final   Acinetobacter baumannii NOT DETECTED NOT DETECTED Final   Enterobacteriaceae species NOT DETECTED NOT DETECTED Final   Enterobacter cloacae complex NOT DETECTED NOT DETECTED Final   Escherichia coli NOT DETECTED NOT DETECTED Final   Klebsiella oxytoca NOT DETECTED NOT DETECTED Final   Klebsiella pneumoniae NOT DETECTED NOT DETECTED Final   Proteus species NOT  DETECTED NOT DETECTED Final   Serratia marcescens NOT DETECTED NOT DETECTED Final   Haemophilus influenzae NOT DETECTED NOT DETECTED Final   Neisseria meningitidis NOT DETECTED NOT DETECTED Final   Pseudomonas aeruginosa NOT DETECTED NOT DETECTED Final   Candida albicans NOT DETECTED NOT DETECTED Final   Candida glabrata NOT DETECTED NOT DETECTED Final   Candida krusei NOT DETECTED NOT DETECTED Final   Candida parapsilosis NOT DETECTED NOT DETECTED Final   Candida tropicalis NOT DETECTED NOT DETECTED Final    Comment: Performed at [facility], [place]. [address], [place], [place] [address]  Blood culture (routine x 2)     Status: None (Preliminary result)   Collection Time: 02/09/19  5:15 PM  Result Value Ref Range Status   Specimen Description BLOOD LEFT ANTECUBITAL  Final   Special Requests   Final    BOTTLES DRAWN AEROBIC AND ANAEROBIC Blood Culture adequate volume   Culture   Final    NO GROWTH 4 DAYS Performed at [facility], [address], [place], [place] [address]    Report Status PENDING  Incomplete  CSF culture     Status: None   Collection Time: 02/09/19 10:45 PM  Result Value Ref Range Status   Specimen Description CSF  Final   Special Requests Normal  Final   Gram Stain   Final    WBC PRESENT, PREDOMINANTLY MONONUCLEAR NO ORGANISMS SEEN CYTOSPIN SMEAR    Culture   Final    NO GROWTH Performed at [facility], [place] [address], [place], [place] [address]    Report Status 02/13/2019 FINAL  Final  Culture, fungus without smear     Status: None (Preliminary result)   Collection Time: 02/09/19 10:45 PM  Result Value Ref Range Status   Specimen Description CSF  Final   Special Requests Normal  Final   Culture   Final    NO FUNGUS ISOLATED AFTER 3 DAYS Performed at [facility], [place] [address], [place], [place] [address]    Report Status PENDING  Incomplete  Anaerobic culture     Status: None (Preliminary result)   Collection Time: 02/09/19  10:45 PM  Result Value Ref Range Status   Specimen Description CSF  Final   Special Requests Normal  Final   Gram Stain PENDING  Incomplete   Culture   Final    NO GROWTH 2 DAYS Performed at [facility], [place] [address] [place], [place] [address]    Report Status PENDING  Incomplete  Culture, blood (routine x 2)     Status: None (Preliminary result)   Collection Time: 02/11/19  9:01 AM  Result Value Ref Range Status   Specimen Description BLOOD RIGHT ANTECUBITAL  Final   Special Requests   Final    BOTTLES DRAWN AEROBIC ONLY Blood Culture adequate volume   Culture   Final    NO GROWTH 2 DAYS Performed at [facility], [place] [address], [place], [place] [address]    Report Status PENDING  Incomplete  Culture, blood (routine x 2)     Status: None (Preliminary result)   Collection Time: 02/11/19  9:01 AM  Result Value Ref Range Status   Specimen Description BLOOD RIGHT ARM  Final   Special Requests   Final    BOTTLES DRAWN AEROBIC ONLY Blood Culture adequate volume   Culture   Final    NO GROWTH 2 DAYS Performed at [facility], [address], [place], [place] [address]    Report Status PENDING  Incomplete     ASSESSMENT & PLAN:  1. Encephalitis= Seems to be improving clinically with acyclovir therapy. 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 Continue IV acyclovir therapy for now. Once he can safely swallow pills and resumes some of his speech capabilities, he may be a candidate to complete the remainder of his 3 weeks of treatment with PO valtrex. Given the advanced clinical nature of his infection, would expect to begin to see more noticeable speech patterns by treatment  day #5-6. 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 MS recovery now likely affected by emerging hypernatremia as well.  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 Given his multiple open lesions may be transient bacteremia vs skin contaminant from draw. Repeat blood cxs are unrevealing thus far. Fever curve continues to improve but has persisted with low grade temps over the w/e. Rather than stopping antistaphylococcal tx altogether, would favor changing vancomycin to IV zyvox 600 mg q 12 hrs to continue to cover in view of ARF.  3. Medication Monitoring/ARF = Creatinine rose substantially from 0.87 yesterday to 2.23 today. Check BMP daily while on IV acyclovir given risk for crystal-induced nephropathy with medication. Rhabdomyolysis confirmed on admission with initial CK of 1605. CK slowly improving - 1220 Friday, but has not been checked since so would repeat today. Unlikely to be due to vanc as level checked yesterday afternoon was < 4. Liberally hydrate with IVFs, especially while he is unsafe to swallow liquids. Agree with plan to place DHT for purposes of administering free water to correct hypernatremia in conjunction with ARF. Na is now 157 up from 138 on admission. Reduce acyclovir dose to 10 mg/kg daily consistent with presumed CrCl of < 10. If UOP remains stable, may adjust to dosing at true CrCl by tomorrow. 4. Leukocytosis - WBCs waxing and waning but suspect mostly effect of hemoconcentration as culprit behind recent rise given ARF and hypernatremia. Recommend adjusting IVFs, frequent BMPs, and placement of DHT for purpose of administering free water. Check CBC w/ diff daily to track WBCs.  Catalina Antigua, MD  02/13/2019  1:56 PM

## 2019-02-13 NOTE — Progress Notes (Signed)
CSW received a phone call back from  [facility], patients sister. CSW gave bed offers and went over pending bed offers. Patient sister is declining [facility] at this time. We are awaiting a yes/no from [facility] in [place] and [facility].   CSW will continue to assist with disposition.   Domenic Schwab, MSW, [place]

## 2019-02-14 ENCOUNTER — Inpatient Hospital Stay (HOSPITAL_COMMUNITY): Payer: Medicare Other

## 2019-02-14 DIAGNOSIS — K0889 Other specified disorders of teeth and supporting structures: Secondary | ICD-10-CM

## 2019-02-14 LAB — CBC WITH DIFFERENTIAL/PLATELET
Abs Immature Granulocytes: 0.16 10*3/uL — ABNORMAL HIGH (ref 0.00–0.07)
Basophils Absolute: 0 10*3/uL (ref 0.0–0.1)
Basophils Relative: 0 %
Eosinophils Absolute: 0.1 10*3/uL (ref 0.0–0.5)
Eosinophils Relative: 1 %
HCT: 23.8 % — ABNORMAL LOW (ref 39.0–52.0)
Hemoglobin: 7.5 g/dL — ABNORMAL LOW (ref 13.0–17.0)
Immature Granulocytes: 1 %
Lymphocytes Relative: 8 %
Lymphs Abs: 1.1 10*3/uL (ref 0.7–4.0)
MCH: 30.9 pg (ref 26.0–34.0)
MCHC: 31.5 g/dL (ref 30.0–36.0)
MCV: 97.9 fL (ref 80.0–100.0)
Monocytes Absolute: 0.9 10*3/uL (ref 0.1–1.0)
Monocytes Relative: 7 %
Neutro Abs: 11 10*3/uL — ABNORMAL HIGH (ref 1.7–7.7)
Neutrophils Relative %: 83 %
Platelets: 338 10*3/uL (ref 150–400)
RBC: 2.43 MIL/uL — ABNORMAL LOW (ref 4.22–5.81)
RDW: 15.3 % (ref 11.5–15.5)
WBC: 13.3 10*3/uL — ABNORMAL HIGH (ref 4.0–10.5)
nRBC: 0.4 % — ABNORMAL HIGH (ref 0.0–0.2)

## 2019-02-14 LAB — BASIC METABOLIC PANEL
Anion gap: 12 (ref 5–15)
Anion gap: 13 (ref 5–15)
BUN: 44 mg/dL — ABNORMAL HIGH (ref 6–20)
BUN: 49 mg/dL — ABNORMAL HIGH (ref 6–20)
CO2: 21 mmol/L — ABNORMAL LOW (ref 22–32)
CO2: 21 mmol/L — ABNORMAL LOW (ref 22–32)
Calcium: 8.4 mg/dL — ABNORMAL LOW (ref 8.9–10.3)
Calcium: 8.3 mg/dL — ABNORMAL LOW (ref 8.9–10.3)
Chloride: 120 mmol/L — ABNORMAL HIGH (ref 98–111)
Chloride: 117 mmol/L — ABNORMAL HIGH (ref 98–111)
Creatinine, Ser: 3.6 mg/dL — ABNORMAL HIGH (ref 0.61–1.24)
Creatinine, Ser: 4.11 mg/dL — ABNORMAL HIGH (ref 0.61–1.24)
GFR calc Af Amer: 20 mL/min — ABNORMAL LOW (ref >60)
GFR calc Af Amer: 17 mL/min — ABNORMAL LOW (ref >60)
GFR calc non Af Amer: 17 mL/min — ABNORMAL LOW (ref >60)
GFR calc non Af Amer: 15 mL/min — ABNORMAL LOW (ref >60)
Glucose, Bld: 163 mg/dL — ABNORMAL HIGH (ref 70–99)
Glucose, Bld: 120 mg/dL — ABNORMAL HIGH (ref 70–99)
Potassium: 3.8 mmol/L (ref 3.5–5.1)
Potassium: 3.7 mmol/L (ref 3.5–5.1)
Sodium: 153 mmol/L — ABNORMAL HIGH (ref 135–145)
Sodium: 151 mmol/L — ABNORMAL HIGH (ref 135–145)

## 2019-02-14 LAB — GLUCOSE, CAPILLARY
Glucose-Capillary: 156 mg/dL — ABNORMAL HIGH (ref 70–99)
Glucose-Capillary: 149 mg/dL — ABNORMAL HIGH (ref 70–99)
Glucose-Capillary: 145 mg/dL — ABNORMAL HIGH (ref 70–99)
Glucose-Capillary: 254 mg/dL — ABNORMAL HIGH (ref 70–99)
Glucose-Capillary: 108 mg/dL — ABNORMAL HIGH (ref 70–99)

## 2019-02-14 LAB — CULTURE, BLOOD (ROUTINE X 2)
Culture: NO GROWTH
Special Requests: ADEQUATE

## 2019-02-14 LAB — CREATININE, URINE, RANDOM: Creatinine, Urine: 93.06 mg/dL

## 2019-02-14 LAB — ANAEROBIC CULTURE: Special Requests: NORMAL

## 2019-02-14 LAB — OSMOLALITY, URINE: Osmolality, Ur: 354 mOsm/kg (ref 300–900)

## 2019-02-14 LAB — PROTEIN, URINE, RANDOM: Total Protein, Urine: 127 mg/dL

## 2019-02-14 LAB — SODIUM, URINE, RANDOM: Sodium, Ur: 35 mmol/L

## 2019-02-14 MED ORDER — JEVITY 1.2 CAL PO LIQD
480.0000 mL | Freq: Three times a day (TID) | ORAL | Status: DC
  Filled 2019-02-14: qty 711

## 2019-02-14 MED ORDER — PRO-STAT SUGAR FREE PO LIQD: 30.0000 mL | Freq: Two times a day (BID) | ORAL | Status: DC

## 2019-02-14 MED ORDER — DEXTROSE 5 % IV SOLN
INTRAVENOUS | Status: DC
  Administered 2019-02-14 – 2019-02-16 (×6): via INTRAVENOUS

## 2019-02-14 NOTE — Progress Notes (Signed)
Family Medicine Teaching Service Daily Progress Note Intern Pager: [phone]  Patient name: Hulan Szumski Medical record number: [id number] Date of birth: [birth year] Age: 63 y.o. Gender: male  Primary Care Provider: Marco Collie, MD Consultants: neuro, ID, orthopedics (s/o) Code Status: FULL  Pt Overview and Major Events to Date:  4/15 admitted having been found down, AMS, neuro consulted, LP suggestive of HSV encephalitis, acyclovir started, 1/2 Blood cx positive for Coag neg Staph, CSF cx NGTD, CSF fungal NGTD, Anaerobic cx NGTD 4/16 Cefepime discontinued  4/17 Repeat blood cultures  4/18 Vanc Discontinued, ID added linezolid  Antimicrobials: Vancomycin 4/15-4/18 Cefepime 4/15-4/16 Acyclovir 4/15- Linezolid 4/18-  Assessment and Plan: Cresencio Reesor is a 63 y.o. male presenting with AMS after being found down. PMH is significant for cervical dystonia, dysarthria, HLD, gait abnormality, h/o myelopathy.  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 Mental status improving since admission. Febrile to 101.1 on 4/18, relieved with tylenol. S/p Vanc and Cefepime for 1/2 Coag neg Staph, likely contaminant vs transient bacteremia. Repeat blood cx negative <24h. CSF cx neg x2d.  - frequent neuro checks - continue acyclovir x 21 days (4/16-) per neurology and ID -vanc changed to linezolid on 4/18 - PT/OT recommending SNF  - F/u SLP recs, continued NPO with NG - Tylenol PRN for fever/pain - AM CBC, BMP  Severe pharyngeal dysphasia related to above. Pt has had 2 NGs tubes in the last 24 hrs and removed both despite mittens. PEG would not solve this as pt would be likely to remove this as well. We would like to avoid TPN, but pt has been NPO now for 5 days. Right now his only PO med going thru the NG is linezolid.  -keep NG out today as he has already pulled twice, determine GOC   AKI Cr 0.87>>3.6 this am. Likely multifactorial given NPO, vanc, acyclovir.  - recheck am Cr -  continue IVF as below - consult nephrology  Hypernatremia:  Na 149>155>152>153. Likely 2/2 NPO and inadequate fluid intake. No UOP charted from 4/19.  -Change IVF to D5W -recheck Na in am  -strict I/o  -ok to insert foley if unable to keep condom cath on   Anemia: Hemoglobin improved at 7.3>8.3. Iron studies suggestive of anemia of chronic disease, retics likely elevated in the setting of acute illness.   -Daily CBC   Elevated BP with probably h/o HTN Intermittently hypertensive, normotensive this am. - continue hydralazine IV 20mg  q8h schedule while NPO, could change to norvasc when taking po    L humeral neck fracture- mildly comminuted fx of L humeral neck noticed w/ significant bruising to LUE secondary to fall at home. Patient denies pain this morning. - ortho recommended sling for now, could consider reverse total shoulder arthroplasty after clear of infection if still symptomatic at that time. Follow up outpt - PRN tylenol, monitor for pain  FEN/GI: NPO  Prophylaxis: SCDs, no pharm DVT ppx d/t blood in ventricles on MRI brain   Disposition: pending medical management  Subjective:  Pt not oriented this morning, does not understand need for Ng but does track and give garbled speech to my questions. Recognized sister's name when I mentioned I would call her.   Objective: Temp:  [97.6 F (36.4 C)-99.5 F (37.5 C)] 97.7 F (36.5 C) (04/20 0759) Pulse Rate:  [101-118] 102 (04/20 0759) Resp:  [16-18] 17 (04/20 0759) BP: (120-133)/(60-79) 120/69 (04/20 0759) SpO2:  [97 %-99 %] 98 % (04/20 0759)   Physical  Exam:  General: Laying in bed with mitts on, in NAD Cardiovascular: Slightly tachycardic, regular rhythm.  Normal S1 and S2. No murmurs Respiratory: Normal work of breathing with appropriate saturations on room air.  Clear to auscultation bilaterally. Abdomen: Soft, nontender, nondistended, + bowel sounds  Extremities:  Bilateral lower extremities without swelling or  erythema. Neuro: awake and alert.  Laboratory: Recent Labs  Lab 02/11/19 1350 02/13/19 0657 02/14/19 0508  WBC 12.8* 13.8* 13.3*  HGB 7.9* 8.3* 7.5*  HCT 23.8* 25.4* 23.8*  PLT 373 357 338   Recent Labs  Lab 02/09/19 1529  02/11/19 0342  02/13/19 1257 02/13/19 2016 02/14/19 0508  NA 138   < > 149*   < > 157* 152* 153*  K 4.6   < > 3.4*   < > 3.8 3.8 3.8  CL 97*   < > 117*   < > 125* 119* 120*  CO2 21*   < > 21*   < > 24 25 21*  BUN 81*   < > 26*   < > 35* 43* 44*  CREATININE 1.38*   < > 0.94   < > 2.23* 2.84* 3.60*  CALCIUM 9.4   < > 8.2*   < > 8.4* 8.4* 8.4*  PROT 8.1  --  6.0*  --   --   --   --   BILITOT 1.9*  --  1.3*  --   --   --   --   ALKPHOS 68  --  55  --   --   --   --   ALT 46*  --  39  --   --   --   --   AST 54*  --  51*  --   --   --   --   GLUCOSE 143*   < > 108*   < > 150* 182* 163*   < > = values in this interval not displayed.   Iron/TIBC/Ferritin/ %Sat    Component Value Date/Time   IRON 51 02/11/2019 0342   TIBC 230 (L) 02/11/2019 0342   FERRITIN 327 02/11/2019 1350   IRONPCTSAT 22 02/11/2019 0342    Imaging/Diagnostic Tests: Dg Abd 1 View  Result Date: 02/14/2019 CLINICAL DATA:  NG tube placement EXAM: ABDOMEN - 1 VIEW COMPARISON:  02/13/2019 FINDINGS: NG tube has been advanced with the tip in the proximal stomach. IMPRESSION: NG tube tip in the proximal stomach. Electronically Signed   By: Rolm Baptise M.D.   On: 02/14/2019 02:58   Dg Abd 1 View  Result Date: 02/13/2019 CLINICAL DATA:  NG tube placement EXAM: ABDOMEN - 1 VIEW COMPARISON:  02/13/2027, earlier same day FINDINGS: 2337 hours. Tip the NG tube is positioned similar to prior study, in the distal esophagus. Tube could be advanced another 11-12 cm to get the proximal side port below the GE junction. Diffuse gaseous bowel distention again noted. IMPRESSION: NG tube tip is in the distal esophagus, as above. Electronically Signed   By: Misty Stanley M.D.   On: 02/13/2019 23:58   Dg Abd  1 View  Result Date: 02/13/2019 CLINICAL DATA:  Encounter for NG tube EXAM: ABDOMEN - 1 VIEW COMPARISON:  02/13/2019 FINDINGS: NG tube tip remains in the distal esophagus, unchanged since prior study. Nonobstructive bowel gas pattern. Oral contrast material noted within large and small bowel loops. IMPRESSION: NG tube tip remains in the distal esophagus. Electronically Signed   By: Rolm Baptise M.D.   On:  02/13/2019 20:50   Dg Abd Portable 1v  Result Date: 02/13/2019 CLINICAL DATA:  NG tube placement. EXAM: PORTABLE ABDOMEN - 1 VIEW COMPARISON:  None. FINDINGS: NG tube is identified with tip overlying the distal esophagus. Recommend advancement. IMPRESSION: NG tube with tip overlying the distal esophagus. Recommend advancement. Electronically Signed   By: Margarette Canada M.D.   On: 02/13/2019 18:05   Dg Swallowing Func-speech Pathology  Result Date: 02/13/2019 Objective Swallowing Evaluation: Type of Study: MBS-Modified Barium Swallow Study  Patient Details Name: Redmond Whittley MRN: [id number] Date of Birth: 02-16-1956 Today's Date: 02/13/2019 Time: SLP Start Time (ACUTE ONLY): 1505 -SLP Stop Time (ACUTE ONLY): 1550 SLP Time Calculation (min) (ACUTE ONLY): 45 min Past Medical History: Past Medical History: Diagnosis Date . Dysarthria  . Dystonia  . Gait disturbance  . Myelopathy ([place])  . Paraplegia [facility])  Past Surgical History: Past Surgical History: Procedure Laterality Date . NECK SURGERY    dec 2014 HPI: Melquisedec Journey is a 63 y.o. male presenting with history of paraplegia, myelopathy, dysarthria, cervical dystonia, (?CNS degenerative disorder )admitted from indpendendent living facility with AMS after being found down. 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 This distribution may also be seen in infection, particularly herpes encephalitis, though this seems less likely in this presentation. Sustained  aleft humerus fx,  rhabdomyolsis. 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  Subjective: alert, upright in chair for procedure requires cueing Assessment / Plan / Recommendation CHL IP CLINICAL IMPRESSIONS 02/13/2019 Clinical Impression A severe oral and moderate pharyngeal dysphagia was exhibited, likely of multifactorial etiology. Pt unsafe for PO at this time, short term alternative nutrition recommendations were discussed with Dr. Ky Barban. Pt with extremtly poor oral hygiene, nursing notes despite oral care attempts, persistent buildup persists. Discussed possibility of adding  external humidification to assist with moisture/prevent dry secretion buildup with MD.  Copious dried palatal secretions were extracted by SLP prior to exam. Oral deficits include poor bolus control, ineffective bolus transit, lingual pumping, anterior spillage, and oral residuals. Pharyngeal deficits c/b reduced base of tongue retraction, decreased pharyngeal constriction, reduced timing and efficiency of laryngeal vestibule closure, reduced UES opening, and decreased laryngeal elevation resulting in: -episodic penetration of thin, nectar thick, and  honey thick liquids during and after the swallow. Moderate residuals noted in the valleculae and pyriform sinuses, increasing risk for aspiraiton. SLP to follow up for dysphagia intervention.  SLP Visit Diagnosis Dysphagia, oropharyngeal phase (R13.12) Attention and concentration deficit following -- Frontal lobe and executive function deficit following -- Impact on safety and function Severe aspiration risk;Risk for inadequate nutrition/hydration   CHL IP TREATMENT RECOMMENDATION 02/13/2019 Treatment Recommendations Therapy as outlined in treatment plan below   Prognosis 02/13/2019 Prognosis for Safe Diet Advancement Fair Barriers to Reach Goals Severity of deficits Barriers/Prognosis Comment -- CHL IP DIET RECOMMENDATION 02/13/2019 SLP Diet Recommendations NPO;Alternative means -  temporary Liquid Administration via -- Medication Administration Via alternative means Compensations -- Postural Changes --   CHL IP OTHER RECOMMENDATIONS 02/13/2019 Recommended Consults -- Oral Care Recommendations Oral care QID Other Recommendations --   CHL IP FOLLOW UP RECOMMENDATIONS 02/13/2019 Follow up Recommendations Skilled Nursing facility   [facility] IP FREQUENCY AND DURATION 02/13/2019 Speech Therapy Frequency (ACUTE ONLY) min 2x/week Treatment Duration 2 weeks      CHL IP ORAL PHASE 02/13/2019 Oral Phase Impaired Oral - Pudding Teaspoon -- Oral - Pudding Cup -- Oral - Honey  Teaspoon Delayed oral transit;Decreased bolus cohesion;Lingual/palatal residue;Reduced posterior propulsion;Incomplete tongue to palate contact;Lingual pumping;Weak lingual manipulation Oral - Honey Cup -- Oral - Nectar Teaspoon -- Oral - Nectar Cup Delayed oral transit;Lingual/palatal residue;Reduced posterior propulsion;Incomplete tongue to palate contact;Lingual pumping;Weak lingual manipulation Oral - Nectar Straw -- Oral - Thin Teaspoon Other (Comment) Oral - Thin Cup Weak lingual manipulation;Lingual pumping;Pocketing in anterior sulcus;Lingual/palatal residue;Delayed oral transit;Decreased bolus cohesion;Incomplete tongue to palate contact;Reduced posterior propulsion Oral - Thin Straw Weak lingual manipulation;Lingual pumping;Incomplete tongue to palate contact;Reduced posterior propulsion;Holding of bolus;Lingual/palatal residue;Delayed oral transit Oral - Puree -- Oral - Mech Soft -- Oral - Regular -- Oral - Multi-Consistency -- Oral - Pill -- Oral Phase - Comment --  CHL IP PHARYNGEAL PHASE 02/13/2019 Pharyngeal Phase Impaired Pharyngeal- Pudding Teaspoon -- Pharyngeal -- Pharyngeal- Pudding Cup -- Pharyngeal -- Pharyngeal- Honey Teaspoon Reduced laryngeal elevation;Reduced anterior laryngeal mobility;Reduced epiglottic inversion;Reduced pharyngeal peristalsis;Reduced tongue base retraction;Penetration/Aspiration during  swallow;Pharyngeal residue - valleculae;Pharyngeal residue - pyriform;Pharyngeal residue - posterior pharnyx Pharyngeal Material enters airway, remains ABOVE vocal cords and not ejected out Pharyngeal- Honey Cup -- Pharyngeal -- Pharyngeal- Nectar Teaspoon -- Pharyngeal -- Pharyngeal- Nectar Cup Pharyngeal residue - valleculae;Pharyngeal residue - pyriform;Pharyngeal residue - posterior pharnyx;Reduced airway/laryngeal closure;Reduced laryngeal elevation;Reduced anterior laryngeal mobility;Reduced epiglottic inversion;Reduced pharyngeal peristalsis;Reduced tongue base retraction;Penetration/Apiration after swallow;Penetration/Aspiration during swallow Pharyngeal Material enters airway, remains ABOVE vocal cords and not ejected out Pharyngeal- Nectar Straw -- Pharyngeal -- Pharyngeal- Thin Teaspoon -- Pharyngeal -- Pharyngeal- Thin Cup Reduced epiglottic inversion;Reduced anterior laryngeal mobility;Reduced laryngeal elevation;Reduced tongue base retraction;Pharyngeal residue - valleculae;Pharyngeal residue - pyriform;Reduced pharyngeal peristalsis Pharyngeal -- Pharyngeal- Thin Straw Pharyngeal residue - pyriform;Pharyngeal residue - valleculae;Penetration/Aspiration during swallow;Reduced airway/laryngeal closure;Reduced laryngeal elevation;Reduced anterior laryngeal mobility;Reduced epiglottic inversion;Reduced pharyngeal peristalsis;Pharyngeal residue - posterior pharnyx Pharyngeal Material enters airway, remains ABOVE vocal cords and not ejected out Pharyngeal- Puree -- Pharyngeal -- Pharyngeal- Mechanical Soft -- Pharyngeal -- Pharyngeal- Regular -- Pharyngeal -- Pharyngeal- Multi-consistency -- Pharyngeal -- Pharyngeal- Pill -- Pharyngeal -- Pharyngeal Comment --  CHL IP CERVICAL ESOPHAGEAL PHASE 02/13/2019 Cervical Esophageal Phase Impaired Pudding Teaspoon -- Pudding Cup -- Honey Teaspoon Reduced cricopharyngeal relaxation Honey Cup -- Nectar Teaspoon -- Nectar Cup Reduced cricopharyngeal relaxation Nectar  Straw -- Thin Teaspoon -- Thin Cup -- Thin Straw Reduced cricopharyngeal relaxation Puree -- Mechanical Soft -- Regular -- Multi-consistency -- Pill -- Cervical Esophageal Comment -- Chelsea E Hartness MA, CCC-SLP 02/13/2019, 4:07 PM               Sela Hilding, MD 02/14/2019, 8:49 AM PGY-3, [facility] Intern pager: [phone], text pages welcome

## 2019-02-14 NOTE — Consult Note (Signed)
[facility] Renal Consultation Note  Requesting MD: Hensel  Indication for Consultation:  AKI   Chief complaint:  Found down at home  HPI:  Patrick Daniel is a 63 y.o. male with a past medical history of cervical dystonia who presented to the hospital after being found down after a fall; he was also confused.  Individuals at his independent living facility had noted that his packages were stacking up and alerted staff who located the patient.  He has developed AKI over the course of his hospitalization and nephrology is consulted for assistance with management of the same.  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  He has had issues with dysphasia and note that per infectious disease he is possibly candidate to complete the remainder of his 3-week treatment with p.o. Valtrex once he is able to swallow pills.  He was on vancomycin for coag negative staph bacteremia and ID reported plans to switch to linezolid in the setting of his AKI.  CT earlier this admission is negative for hydro.  He has also developed hypernatremia and per charting has pulled out multiple feeding tubes.  He had his Foley catheter placed earlier today and reportedly had also pulled one out previously.  Per the nurse, this was a difficult Foley placement and he had 950 mL in his bladder prior to Foley.  Per nursing his sister is his contact Georgia Duff at [phone].   Creatinine, Ser  Date/Time Value Ref Range Status  02/14/2019 05:08 AM 3.60 (H) 0.61 - 1.24 mg/dL Final  02/13/2019 08:16 PM 2.84 (H) 0.61 - 1.24 mg/dL Final  02/13/2019 12:57 PM 2.23 (H) 0.61 - 1.24 mg/dL Final  02/13/2019 04:35 AM 1.29 (H) 0.61 - 1.24 mg/dL Final  02/12/2019 04:26 AM 0.87 0.61 - 1.24 mg/dL Final  02/11/2019 03:42 AM 0.94 0.61 - 1.24 mg/dL Final  02/10/2019 04:51 AM 0.95 0.61 - 1.24 mg/dL Final  02/09/2019 03:29 PM 1.38 (H) 0.61 - 1.24 mg/dL Final     PMHx:   Past Medical History:  Diagnosis  Date  . Dysarthria   . Dystonia   . Gait disturbance   . Myelopathy ([place])   . Paraplegia [facility])     Past Surgical History:  Procedure Laterality Date  . NECK SURGERY     dec 2014    Family Hx:  Family History  Problem Relation Age of Onset  . Heart disease Mother   . Cancer Mother   . Heart Problems Father   . High Cholesterol Sister   . High blood pressure Sister   . Diabetes Sister   . High blood pressure Sister     Social History:  reports that he quit smoking about 6 years ago. His smoking use included cigarettes. He has never used smokeless tobacco. He reports that he does not drink alcohol or use drugs.  Allergies:  Allergies  Allergen Reactions  . Baclofen Other (See Comments)    Weakness, drowsiness    Medications: Prior to Admission medications   Medication Sig Start Date End Date Taking? Authorizing Provider  clonazePAM (KLONOPIN) 0.5 MG tablet TAKE 1 TO 2 TABLETS BY MOUTH AT NIGHT AS NEEDED Patient taking differently: Take 0.25-0.5 mg by mouth at bedtime. sleep 01/17/19  Yes Marcial Pacas, MD  doxycycline (PERIOSTAT) 20 MG tablet Take 20 mg by mouth daily.   Yes [provider]  DULoxetine (CYMBALTA) 60 MG capsule TAKE 1 CAPSULE(60 MG) BY MOUTH DAILY Patient taking differently:  Take 60 mg by mouth daily.  12/10/18  Yes Marcial Pacas, MD  mometasone (ELOCON) 0.1 % cream Apply 1 application topically daily as needed (DRY SKIN).     [provider]    I have reviewed the patient's current medications.  Labs:  BMP Latest Ref Rng & Units 02/14/2019 02/13/2019 02/13/2019  Glucose 70 - 99 mg/dL 163(H) 182(H) 150(H)  BUN 6 - 20 mg/dL 44(H) 43(H) 35(H)  Creatinine 0.61 - 1.24 mg/dL 3.60(H) 2.84(H) 2.23(H)  Sodium 135 - 145 mmol/L 153(H) 152(H) 157(H)  Potassium 3.5 - 5.1 mmol/L 3.8 3.8 3.8  Chloride 98 - 111 mmol/L 120(H) 119(H) 125(H)  CO2 22 - 32 mmol/L 21(L) 25 24  Calcium 8.9 - 10.3 mg/dL 8.4(L) 8.4(L) 8.4(L)    Urinalysis    Component Value  Date/Time   COLORURINE YELLOW 02/09/2019 [place] 02/09/2019 1740   LABSPEC 1.023 02/09/2019 1740   PHURINE 5.0 02/09/2019 1740   GLUCOSEU NEGATIVE 02/09/2019 1740   HGBUR MODERATE (A) 02/09/2019 1740   BILIRUBINUR NEGATIVE 02/09/2019 1740   KETONESUR 80 (A) 02/09/2019 1740   PROTEINUR NEGATIVE 02/09/2019 1740   NITRITE NEGATIVE 02/09/2019 1740   LEUKOCYTESUR NEGATIVE 02/09/2019 1740     ROS: Unable to be obtained secondary to dysarthria and AMS  Physical Exam: Vitals:   02/14/19 1220 02/14/19 1528  BP: 137/75 135/85  Pulse: (!) 107 (!) 109  Resp: 20 18  Temp: 97.9 F (36.6 C) 98.3 F (36.8 C)  SpO2: 100% 100%     General: Thin adult male in bed in no acute distress at rest HEENT: Normocephalic atraumatic Eyes: Extraocular movements intact sclera anicteric Heart: Tachycardic no rub Lungs: Clear to auscultation bilaterally normal work of breathing at rest Abdomen: Soft nontender nondistended  Extremities: No edema appreciated no cyanosis or clubbing Skin: No lesions on extremities exposed Neuro: awakens to voice; dsyarthria; disoriented Psych calm on exam  Assessment/Plan:  # Acute renal failure - Multifactorial with insults of ATN, urinary retention, mild rhabdo, and IV acyclovir - Continue hydration with IV fluids - Agree with switching off of vancomycin - Would transition off of IV acyclovir when able per ID -Strict ins and outs - Continue supportive care  - Continue foley catheter  - No acute indication for dialysis - will follow for needs  # HSV encephalitis -Currently on IV acyclovir per ID  # Hypernatremia - On D5W - Repeat BMP with results to Dr. Royce Macadamia   # Urinary retention  - Continue foley catheter   # Bacteremia  -Felt to be transient bacteremia versus skin contaminant per ID - On Linezolid per ID  # Dysphagia  - neurology has evaluated  - history of myelopathy and cervical dystonia   Claudia Desanctis 02/14/2019, 5:35 PM

## 2019-02-14 NOTE — Progress Notes (Addendum)
Occupational Therapy Treatment Patient Details Name: Patrick Daniel MRN: [id number] DOB: 14-Apr-1956 Today's Date: 02/14/2019    History of present illness 63 y.o. male presenting with AMS after being found down at [facility]. PMH is significant for cervical dystonia, dysarthria, HLD, gait abnormality (?CNS degenerative disorder?). Patient unable to provide insight to what caused fall given chronic speech issues that have acutely worsened. Known mobility issues at baseline with frequent falls. Concern for traumatic head injury after fall vs stroke vs seizure. Head CT with temporal lobe edema. MRI with concern for acute or early subacute infarct vs infection like HSV. Pt also found to have comminuted fx of L humeral neck, ortho following.    OT comments  Pt progressing slowly.  Continues to require max multimodal cueing to engage in tasks with constant redirection, able to follow simple 1 step commands inconsistently.  He continues to require max assist +2 for basic transfers, cueing to maintain NWB to R UE and adjusted sling as necessary.  Continue to recommend SNF.  Will follow.    Follow Up Recommendations  SNF;Supervision/Assistance - 24 hour    Equipment Recommendations  Other (comment)(TBD at next venue of care)    Recommendations for Other Services      Precautions / Restrictions Precautions Precautions: Fall;Shoulder;Other (comment) Type of Shoulder Precautions: sling, NWB  Shoulder Interventions: Shoulder sling/immobilizer Precaution Comments: watch HR Required Braces or Orthoses: Sling Restrictions Weight Bearing Restrictions: Yes LUE Weight Bearing: Non weight bearing       Mobility Bed Mobility Overal bed mobility: Needs Assistance Bed Mobility: Rolling;Sidelying to Sit Rolling: Mod assist Sidelying to sit: +2 for physical assistance;Mod assist       General bed mobility comments: Pt initiating LE movement towards EOB. Pt required assist to get LE's off EOB completely, and  +2 mod assist for trunk elevation to full sitting position.   Transfers Overall transfer level: Needs assistance Equipment used: 2 person hand held assist Transfers: Sit to/from [employer] Sit to Stand: Max assist;+2 physical assistance   Squat pivot transfers: Max assist;+2 physical assistance     General transfer comment: Pt able to achieve full stand this session, however unable to advance feet for pivotal steps around to recliner. Squat pivot was utilized for transition to recliner.     Balance Overall balance assessment: Needs assistance Sitting-balance support: Feet supported Sitting balance-Leahy Scale: Poor Sitting balance - Comments: Mod to max assist required at all times for sitting balance EOB. Heavy R lateral lean Postural control: Right lateral lean                                 ADL either performed or assessed with clinical judgement   ADL Overall ADL's : Needs assistance/impaired     Grooming: Maximal assistance;Sitting Grooming Details (indicate cue type and reason): requires max assist to attend to task, hand over hand support to wash face for initation                  Toilet Transfer: Maximal assistance;+2 for physical assistance;+2 for safety/equipment;Squat-pivot Toilet Transfer Details (indicate cue type and reason): simulated to recliner          Functional mobility during ADLs: Maximal assistance;+2 for physical assistance;+2 for safety/equipment General ADL Comments: continues to be limited by impaired balance, impaired cognition and decreased act tolerance     Vision       Perception     Praxis  Cognition Arousal/Alertness: Awake/alert Behavior During Therapy: WFL for tasks assessed/performed;Flat affect Overall Cognitive Status: Impaired/Different from baseline Area of Impairment: Attention;Following commands;Safety/judgement;Awareness;Problem solving                   Current  Attention Level: Focused   Following Commands: Follows one step commands inconsistently;Follows one step commands with increased time Safety/Judgement: Decreased awareness of deficits Awareness: Intellectual Problem Solving: Difficulty sequencing;Requires verbal cues;Requires tactile cues;Slow processing;Decreased initiation General Comments: pt able to answer yes/no questions; requires multimodal cues to follow commands and to redirect to task at hand        Exercises Exercises: Hand exercises Hand Exercises Wrist Flexion: AROM;Left;5 reps Wrist Extension: AROM;Left;5 reps Digit Composite Flexion: AROM;Left;5 reps Composite Extension: AROM;Left;5 reps   Shoulder Instructions       General Comments      Pertinent Vitals/ Pain       Pain Assessment: No/denies pain Faces Pain Scale: No hurt Pain Intervention(s): Monitored during session  Home Living                                          Prior Functioning/Environment              Frequency  Min 3X/week        Progress Toward Goals  OT Goals(current goals can now be found in the care plan section)  Progress towards OT goals: Progressing toward goals  Acute Rehab OT Goals Patient Stated Goal: none stated today, agreeable to working  with therapies  Plan Discharge plan remains appropriate    Co-evaluation    PT/OT/SLP Co-Evaluation/Treatment: Yes Reason for Co-Treatment: Complexity of the patient's impairments (multi-system involvement);Necessary to address cognition/behavior during functional activity;To address functional/ADL transfers;For patient/therapist safety PT goals addressed during session: Mobility/safety with mobility;Balance;Strengthening/ROM OT goals addressed during session: ADL's and self-care      AM-PAC OT "6 Clicks" Daily Activity     Outcome Measure   Help from another person eating meals?: Total(NPO) Help from another person taking care of personal grooming?: A  Lot Help from another person toileting, which includes using toliet, bedpan, or urinal?: Total Help from another person bathing (including washing, rinsing, drying)?: A Lot Help from another person to put on and taking off regular upper body clothing?: Total Help from another person to put on and taking off regular lower body clothing?: Total 6 Click Score: 8    End of Session Equipment Utilized During Treatment: Gait belt;Other (comment)(sling)  OT Visit Diagnosis: Muscle weakness (generalized) (M62.81);Other symptoms and signs involving cognitive function;History of falling (Z91.81)   Activity Tolerance Patient tolerated treatment well   Patient Left in chair;with call bell/phone within reach;with chair alarm set   Nurse Communication Mobility status        Time: 9767-3419 OT Time Calculation (min): 23 min  Charges: OT General Charges $OT Visit: 1 Visit OT Treatments $Self Care/Home Management : 8-22 mins  Delight Stare, [place] Pager [phone] Office [phone]    Delight Stare 02/14/2019, 10:05 AM

## 2019-02-14 NOTE — Progress Notes (Signed)
Advancement of NG tube a couple of times without success. Pt pull tube completely out at one time. Reinserted the tube with another nurse Rolene Course, RN), rapid nurse also helped with advancement of the tube. Xray shows in proximal stomach; hear whooshing sound with auscultation for correct placement. Jevity infusing per order.

## 2019-02-14 NOTE — Progress Notes (Signed)
[facility]  Date of Admission:  02/09/2019     Total days of antibiotics 6 Linezolid day 2 Acyclovir, day 5           Patient ID: Patrick Daniel is a 63 y.o. 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 chlorhexidine  15 mL Mouth Rinse BID  . hydrALAZINE  20 mg Intravenous Q8H  . lidocaine  10 mL Intradermal Once  . mouth rinse  15 mL Mouth Rinse q12n4p    SUBJECTIVE: Making good eye contact today on exam and attempting to answer questions to the best of his abilities. Speech is still difficult to understand especially in the setting of a very dry mouth. He has pulled out his Cortrak feeding tube. Still with some fevers to 101 on 4/18 with overall down-trending patterns. WBC stable 13.3K. Repeat blood cultures from 4/17 negative.    Review of Systems: Review of Systems  Unable to perform ROS: Mental status change    Allergies  Allergen Reactions  . Baclofen Other (See Comments)    Weakness, drowsiness    OBJECTIVE: Vitals:   02/14/19 0044 02/14/19 0437 02/14/19 0759 02/14/19 1220  BP: 129/66 132/74 120/69 137/75  Pulse: (!) 118 (!) 118 (!) 102 (!) 107  Resp: 18 18 17 20   Temp: 98.4 F (36.9 C) 98.7 F (37.1 C) 97.7 F (36.5 C) 97.9 F (36.6 C)  TempSrc: Oral Oral Oral Oral  SpO2: 98% 97% 98% 100%  Weight:      Height:       Body mass index is 23.13 kg/m.  Physical Exam Constitutional:      Comments: Sitting up in recliner, he is very alert. Shakes head no and yes appropriately. Attempts to verbalize but most speech is unintelligible.   HENT:     Mouth/Throat:     Mouth: Mucous membranes are dry.     Comments: Thick, dry secretions noted throughout the  mouth with sloughed debris. No ulcerations appreciated necessarily however large amount of dry/sticky debris. Very poor dentition.  Eyes:     General: No scleral icterus.    Pupils: Pupils are equal, round, and reactive to light.  Cardiovascular:     Rate and Rhythm: Tachycardia present.     Pulses: Normal pulses.     Heart sounds: No murmur.  Pulmonary:     Effort: Pulmonary effort is normal.     Breath sounds: Normal breath sounds.  Abdominal:     General: There is no distension.     Palpations: Abdomen is soft.  Skin:    General: Skin is warm and dry.     Capillary Refill: Capillary refill takes less than 2 seconds.     Comments: Multiple scabbed abrasions on knees. All healing w/o signs of infection.   Neurological:     Comments: Neurologically he has improved significantly. He is able to shake head yes and no appropriately. Attempting to communicate more verbally with difficult to understand and often intelligible speech.      Lab Results Lab Results  Component Value Date   WBC 13.3 (H) 02/14/2019   HGB 7.5 (L) 02/14/2019   HCT 23.8 (L) 02/14/2019  MCV 97.9 02/14/2019   PLT 338 02/14/2019    Lab Results  Component Value Date   CREATININE 3.60 (H) 02/14/2019   BUN 44 (H) 02/14/2019   NA 153 (H) 02/14/2019   K 3.8 02/14/2019   CL 120 (H) 02/14/2019   CO2 21 (L) 02/14/2019    Lab Results  Component Value Date   ALT 39 02/11/2019   AST 51 (H) 02/11/2019   ALKPHOS 55 02/11/2019   BILITOT 1.3 (H) 02/11/2019     Microbiology: Recent Results (from the past 240 hour(s))  Blood culture (routine x 2)     Status: Abnormal   Collection Time: 02/09/19  5:08 PM  Result Value Ref Range Status   Specimen Description BLOOD RIGHT ANTECUBITAL  Final   Special Requests   Final    BOTTLES DRAWN AEROBIC AND ANAEROBIC Blood Culture results may not be optimal due to an inadequate volume of blood received in culture bottles   Culture  Setup Time   Final    GRAM POSITIVE COCCI IN  CLUSTERS IN BOTH AEROBIC AND ANAEROBIC BOTTLES CRITICAL RESULT CALLED TO, READ BACK BY AND VERIFIED WITH: PHARMD ERIN DEJA AT 8295 ON 02/10/2019 BY MHOUEGNIFIO    Culture (A)  Final    STAPHYLOCOCCUS SPECIES (COAGULASE NEGATIVE) THE SIGNIFICANCE OF ISOLATING THIS ORGANISM FROM A SINGLE SET OF BLOOD CULTURES WHEN MULTIPLE SETS ARE DRAWN IS UNCERTAIN. PLEASE NOTIFY THE MICROBIOLOGY DEPARTMENT WITHIN ONE WEEK IF SPECIATION AND SENSITIVITIES ARE REQUIRED. Performed at [facility], [place] [address], [place], [place] [address]    Report Status 02/12/2019 FINAL  Final  Blood Culture ID Panel (Reflexed)     Status: Abnormal   Collection Time: 02/09/19  5:08 PM  Result Value Ref Range Status   Enterococcus species NOT DETECTED NOT DETECTED Final   Listeria monocytogenes NOT DETECTED NOT DETECTED Final   Staphylococcus species DETECTED (A) NOT DETECTED Final    Comment: Methicillin (oxacillin) resistant coagulase negative staphylococcus. Possible blood culture contaminant (unless isolated from more than one blood culture draw or clinical case suggests pathogenicity). No antibiotic treatment is indicated for blood  culture contaminants. CRITICAL RESULT CALLED TO, READ BACK BY AND VERIFIED WITH: PHARMD ERIN DEJA AT 1332 ON 02/10/2019 BY MHOUEGNIFIO    Staphylococcus aureus (BCID) NOT DETECTED NOT DETECTED Final   Methicillin resistance DETECTED (A) NOT DETECTED Final    Comment: CRITICAL RESULT CALLED TO, READ BACK BY AND VERIFIED WITH: PHARMD ERIN DEJA AT 1332 ON 02/10/2019 BY MHOUEGNIFIO    Streptococcus species NOT DETECTED NOT DETECTED Final   Streptococcus agalactiae NOT DETECTED NOT DETECTED Final   Streptococcus pneumoniae NOT DETECTED NOT DETECTED Final   Streptococcus pyogenes NOT DETECTED NOT DETECTED Final   Acinetobacter baumannii NOT DETECTED NOT DETECTED Final   Enterobacteriaceae species NOT DETECTED NOT DETECTED Final   Enterobacter cloacae complex NOT DETECTED NOT DETECTED  Final   Escherichia coli NOT DETECTED NOT DETECTED Final   Klebsiella oxytoca NOT DETECTED NOT DETECTED Final   Klebsiella pneumoniae NOT DETECTED NOT DETECTED Final   Proteus species NOT DETECTED NOT DETECTED Final   Serratia marcescens NOT DETECTED NOT DETECTED Final   Haemophilus influenzae NOT DETECTED NOT DETECTED Final   Neisseria meningitidis NOT DETECTED NOT DETECTED Final   Pseudomonas aeruginosa NOT DETECTED NOT DETECTED Final   Candida albicans NOT DETECTED NOT DETECTED Final   Candida glabrata NOT DETECTED NOT DETECTED Final   Candida krusei NOT DETECTED NOT DETECTED Final   Candida parapsilosis NOT DETECTED  NOT DETECTED Final   Candida tropicalis NOT DETECTED NOT DETECTED Final    Comment: Performed at [facility], [place] [address], [place], [place] [address]  Blood culture (routine x 2)     Status: None   Collection Time: 02/09/19  5:15 PM  Result Value Ref Range Status   Specimen Description BLOOD LEFT ANTECUBITAL  Final   Special Requests   Final    BOTTLES DRAWN AEROBIC AND ANAEROBIC Blood Culture adequate volume   Culture   Final    NO GROWTH 5 DAYS Performed at [facility], [place] [address] [place], [place] [address]    Report Status 02/14/2019 FINAL  Final  CSF culture     Status: None   Collection Time: 02/09/19 10:45 PM  Result Value Ref Range Status   Specimen Description CSF  Final   Special Requests Normal  Final   Gram Stain   Final    WBC PRESENT, PREDOMINANTLY MONONUCLEAR NO ORGANISMS SEEN CYTOSPIN SMEAR    Culture   Final    NO GROWTH Performed at [facility], [place] [address] [place], [place] [address]    Report Status 02/13/2019 FINAL  Final  Culture, fungus without smear     Status: None (Preliminary result)   Collection Time: 02/09/19 10:45 PM  Result Value Ref Range Status   Specimen Description CSF  Final   Special Requests Normal  Final   Culture   Final    NO FUNGUS ISOLATED AFTER 3 DAYS Performed at [facility], [place] [address], [place], [place] [address]    Report Status PENDING  Incomplete  Anaerobic culture     Status: None   Collection Time: 02/09/19 10:45 PM  Result Value Ref Range Status   Specimen Description CSF  Final   Special Requests Normal  Final   Culture   Final    NO ANAEROBES ISOLATED Performed at [facility], [place] [address], [place], [place] [address]    Report Status 02/14/2019 FINAL  Final  Culture, blood (routine x 2)     Status: None (Preliminary result)   Collection Time: 02/11/19  9:01 AM  Result Value Ref Range Status   Specimen Description BLOOD RIGHT ANTECUBITAL  Final   Special Requests   Final    BOTTLES DRAWN AEROBIC ONLY Blood Culture adequate volume   Culture   Final    NO GROWTH 3 DAYS Performed at [facility], [place] [address] [place], [place] [address]    Report Status PENDING  Incomplete  Culture, blood (routine x 2)     Status: None (Preliminary result)   Collection Time: 02/11/19  9:01 AM  Result Value Ref Range Status   Specimen Description BLOOD RIGHT ARM  Final   Special Requests   Final    BOTTLES DRAWN AEROBIC ONLY Blood Culture adequate volume   Culture   Final    NO GROWTH 3 DAYS Performed at [facility], [address], [place], [place] [address]    Report Status PENDING  Incomplete     ASSESSMENT & PLAN:  1. 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 He had been recovering nicely with IV acyclovir until AKI.   Once he can safely swallow pills and resumes some of his speech capabilities, he may be a candidate to complete the remainder of his 3 weeks of treatment with PO valtrex.  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  2. 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 Given his multiple open lesions may be transient bacteremia vs skin contaminant from draw. Repeat blood cxs are unrevealing thus far. Fever curve continues to improve. Continue linezolid for 1 more day.   3. Medication Monitoring = Continue to check BMP daily while on IV acyclovir given risk for crystal-induced nephropathy with medication.   4. Rhabdo/AKI = Acutely worsened with creatinine > 3.6. He is off vancomycin (although trough level was low so less likely due directly to this). Likely poor intake and hypovolemia has contributed significantly. Agree with nephrology consult.    Janene Madeira, MSN, NP-C [facility] [email or web address] Pager: [phone] Office: [phone] [place]: [phone]    02/14/2019  12:57 PM

## 2019-02-14 NOTE — Progress Notes (Signed)
Physical Therapy Treatment Patient Details Name: Patrick Daniel MRN: [id number] DOB: 1956/05/10 Today's Date: 02/14/2019    History of Present Illness 63 y.o. male presenting with AMS after being found down at [facility]. PMH is significant for cervical dystonia, dysarthria, HLD, gait abnormality (?CNS degenerative disorder?). Patient unable to provide insight to what caused fall given chronic speech issues that have acutely worsened. Known mobility issues at baseline with frequent falls. Concern for traumatic head injury after fall vs stroke vs seizure. Head CT with temporal lobe edema. MRI with concern for acute or early subacute infarct vs infection like HSV. Pt also found to have comminuted fx of L humeral neck, ortho following.     PT Comments    Pt progressing slowly towards PT goals. Pt requiring consistent +2 assist throughout session, and was able to transition to recliner with +2 max assist. Although still requiring heavy assist, he is improved from last session and more interactive/engaged with session. SNF remains appropriate. Will continue to follow and progress as able per POC.     Follow Up Recommendations  SNF;Supervision/Assistance - 24 hour     Equipment Recommendations  Other (comment)(TBD by next venue of care)    Recommendations for Other Services       Precautions / Restrictions Precautions Precautions: Fall;Shoulder;Other (comment) Type of Shoulder Precautions: sling, NWB  Shoulder Interventions: Shoulder sling/immobilizer Precaution Comments: watch HR Required Braces or Orthoses: Sling Restrictions Weight Bearing Restrictions: Yes LUE Weight Bearing: Non weight bearing    Mobility  Bed Mobility Overal bed mobility: Needs Assistance Bed Mobility: Rolling;Sidelying to Sit Rolling: Mod assist Sidelying to sit: +2 for physical assistance;Mod assist       General bed mobility comments: Pt initiating LE movement towards EOB. Pt required assist to get LE's off EOB  completely, and +2 mod assist for trunk elevation to full sitting position.   Transfers Overall transfer level: Needs assistance Equipment used: 2 person hand held assist Transfers: Sit to/from Patrick Daniel Sit to Stand: Max assist;+2 physical assistance   Squat pivot transfers: Max assist;+2 physical assistance     General transfer comment: Pt able to achieve full stand this session, however unable to advance feet for pivotal steps around to recliner. Squat pivot was utilized for transition to recliner.   Ambulation/Gait             General Gait Details: Unable to progress to ambulation this session.    Stairs             Wheelchair Mobility    Modified Rankin (Stroke Patients Only)       Balance Overall balance assessment: Needs assistance Sitting-balance support: Feet supported Sitting balance-Leahy Scale: Poor Sitting balance - Comments: Mod to max assist required at all times for sitting balance EOB. Heavy R lateral lean Postural control: Right lateral lean                                  Cognition Arousal/Alertness: Awake/alert Behavior During Therapy: WFL for tasks assessed/performed;Flat affect Overall Cognitive Status: Impaired/Different from baseline Area of Impairment: Attention;Following commands;Safety/judgement;Awareness                   Current Attention Level: Focused   Following Commands: Follows one step commands inconsistently;Follows one step commands with increased time Safety/Judgement: Decreased awareness of deficits Awareness: Intellectual   General Comments: pt able to answer yes/no questions; requires multimodal cues to follow  commands and to redirect to task at hand      Exercises      General Comments        Pertinent Vitals/Pain Pain Assessment: No/denies pain Faces Pain Scale: No hurt Pain Intervention(s): Monitored during session    Home Living                       Prior Function            PT Goals (current goals can now be found in the care plan section) Acute Rehab PT Goals Patient Stated Goal: none stated today, agreeable to working  with therapies PT Goal Formulation: Patient unable to participate in goal setting Time For Goal Achievement: 02/24/19 Potential to Achieve Goals: Good Progress towards PT goals: Progressing toward goals    Frequency    Min 3X/week      PT Plan Current plan remains appropriate    Co-evaluation PT/OT/SLP Co-Evaluation/Treatment: Yes Reason for Co-Treatment: To address functional/ADL transfers;Necessary to address cognition/behavior during functional activity;Complexity of the patient's impairments (multi-system involvement);For patient/therapist safety PT goals addressed during session: Mobility/safety with mobility;Balance;Strengthening/ROM        AM-PAC PT "6 Clicks" Mobility   Outcome Measure  Help needed turning from your back to your side while in a flat bed without using bedrails?: A Lot Help needed moving from lying on your back to sitting on the side of a flat bed without using bedrails?: A Lot Help needed moving to and from a bed to a chair (including a wheelchair)?: A Lot Help needed standing up from a chair using your arms (e.g., wheelchair or bedside chair)?: A Lot Help needed to walk in hospital room?: Total Help needed climbing 3-5 steps with a railing? : Total 6 Click Score: 10    End of Session Equipment Utilized During Treatment: Oxygen Activity Tolerance: Patient limited by fatigue Patient left: in chair;with call bell/phone within reach;with chair alarm set Nurse Communication: Mobility status PT Visit Diagnosis: Muscle weakness (generalized) (M62.81);History of falling (Z91.81);Difficulty in walking, not elsewhere classified (R26.2);Pain Pain - Right/Left: Left Pain - part of body: Shoulder     Time: 6256-3893 PT Time Calculation (min) (ACUTE ONLY): 23 min  Charges:   $Gait Training: 8-22 mins                     Rolinda Roan, PT, DPT Acute Rehabilitation Services Pager: [phone] Office: [phone]    Thelma Comp 02/14/2019, 9:50 AM

## 2019-02-14 NOTE — Progress Notes (Addendum)
Initial Nutrition Assessment   RD working remotely.  DOCUMENTATION CODES:   Not applicable  INTERVENTION:  If enteral nutrition warranted per GOC, Recommend bolus feeds using Jevity 1.2 formula at volume of 120 ml (half carton/ARC) and advance by 120 ml every feeding to goal bolus volume of 480 ml (2 cartons/ARCs) given TID.   Recommend 30 ml Prostat BID per tube, each supplement provides 100 kcal and 15 grams of protein.   Tube feeding regimen to provide 1928 kcal (100% of needs), 109 grams of protein, 1166 ml free water.   NUTRITION DIAGNOSIS:   Inadequate oral intake related to inability to eat as evidenced by NPO status.  GOAL:   Patient will meet greater than or equal to 90% of their needs  MONITOR:   TF tolerance, Labs, Skin, Diet advancement, Weight trends, I & O's  REASON FOR ASSESSMENT:   Consult Enteral/tube feeding initiation and management  ASSESSMENT:   63 y.o. male presenting with AMS after being found down. PMH is significant for cervical dystonia, dysarthria, HLD, gait abnormality, h/o myelopathy. 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 Pt with fx of L humeral neck w/ significant bruising to LUEsecondary to fall at home.  Per MD, pt's speech incomprehensible. RD unable to obtain pt's nutrition history. Pt with a 5.6% weight loss in 2 months per weight record, which is found not significant for time frame. Pt with severe dysphagia per MBS evaluation and unsafe for PO at this time. SLP recommends short term alternative nutrition. Per RN, pt pulled NGT this AM. Noted two NG tubes have been pulled within the last 24 hours despite pt wearing mittens. No plans to replace NGT today per MD. Plans to determine [place] with pt/family. Tube feeding recommendations started above if enteral nutrition is warranted. RD to continue to monitor.   Unable to complete Nutrition-Focused physical exam at this time.   Diet Order:   Diet Order            Diet NPO time specified  Diet  effective now              EDUCATION NEEDS:   Not appropriate for education at this time  Skin:  Skin Assessment: Reviewed RN Assessment  Last BM:  Unknown  Height:   Ht Readings from Last 1 Encounters:  02/11/19 5\' 6"  (1.676 m)    Weight:   Wt Readings from Last 1 Encounters:  02/11/19 65 kg    Ideal Body Weight:  64.5 kg  BMI:  Body mass index is 23.13 kg/m.  Estimated Nutritional Needs:   Kcal:  9326-7124  Protein:  90-100 grams  Fluid:  1.9 - 2.1 L/day    Corrin Parker, MS, RD, LDN Pager # [phone] After hours/ weekend pager # [phone]

## 2019-02-14 NOTE — Progress Notes (Signed)
  Speech Language Pathology Treatment: Dysphagia  Patient Details Name: Patrick Daniel MRN: [id number] DOB: [birth year] Today's Date: 02/14/2019 Time: 7619-5093 SLP Time Calculation (min) (ACUTE ONLY): 55 min  Assessment / Plan / Recommendation Clinical Impression  Patient seen to address dysphagia goals with oral care and PO trials with toothette swabs of water. Patient was much more lucid today and confusion seems to be resolving. He was also easier to understand during majority of session, however his dysarthria remains and it worsened as session progressed. Patient was also taking a more active role in oral care, attempting to remove some of the dried secretions in oral cavity with his tongue (was effective at times). SLP removed majority of dried secretions on upper palate, along gumline and on teeth using toothbrush, toothette sponge and oral suction. When mouth was more moist, secretions started to loosen considerably and patient then started to move tongue and spit out when able. Patient did not exhibit any overt s/s of aspiration with toothette sponge filled with water, but continues with dis-coordinated swallow.     HPI HPI: Patrick Daniel is a 63 y.o. male presenting with history of paraplegia, myelopathy, dysarthria, cervical dystonia, (?CNS degenerative disorder )admitted from [facility]pendendent living facility with AMS after being found down. 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 This distribution may also be seen in infection, particularly herpes encephalitis, though this seems less likely in this presentation. Sustained  aleft humerus fx, rhabdomyolsis. 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      SLP Plan  Continue with current plan of care       Recommendations  Diet recommendations: NPO Medication Administration: Via alternative means                Oral  Care Recommendations: Oral care QID Follow up Recommendations: Skilled Nursing facility SLP Visit Diagnosis: Dysphagia, oropharyngeal phase (R13.12) Plan: Continue with current plan of care       GO                [name] 02/14/2019, 5:09 PM   Sonia Baller, MA, [facility] Pager: [phone]

## 2019-02-15 ENCOUNTER — Other Ambulatory Visit: Payer: Self-pay

## 2019-02-15 DIAGNOSIS — R1310 Dysphagia, unspecified: Secondary | ICD-10-CM

## 2019-02-15 DIAGNOSIS — B9562 Methicillin resistant Staphylococcus aureus infection as the cause of diseases classified elsewhere: Secondary | ICD-10-CM

## 2019-02-15 DIAGNOSIS — Z515 Encounter for palliative care: Secondary | ICD-10-CM

## 2019-02-15 DIAGNOSIS — Z66 Do not resuscitate: Secondary | ICD-10-CM

## 2019-02-15 LAB — CBC WITH DIFFERENTIAL/PLATELET
Abs Immature Granulocytes: 0.12 10*3/uL — ABNORMAL HIGH (ref 0.00–0.07)
Basophils Absolute: 0 10*3/uL (ref 0.0–0.1)
Basophils Relative: 0 %
Eosinophils Absolute: 0.1 10*3/uL (ref 0.0–0.5)
Eosinophils Relative: 1 %
HCT: 23 % — ABNORMAL LOW (ref 39.0–52.0)
Hemoglobin: 7.4 g/dL — ABNORMAL LOW (ref 13.0–17.0)
Immature Granulocytes: 1 %
Lymphocytes Relative: 8 %
Lymphs Abs: 1.1 10*3/uL (ref 0.7–4.0)
MCH: 31.6 pg (ref 26.0–34.0)
MCHC: 32.2 g/dL (ref 30.0–36.0)
MCV: 98.3 fL (ref 80.0–100.0)
Monocytes Absolute: 0.8 10*3/uL (ref 0.1–1.0)
Monocytes Relative: 6 %
Neutro Abs: 10.8 10*3/uL — ABNORMAL HIGH (ref 1.7–7.7)
Neutrophils Relative %: 84 %
Platelets: 307 10*3/uL (ref 150–400)
RBC: 2.34 MIL/uL — ABNORMAL LOW (ref 4.22–5.81)
RDW: 15.7 % — ABNORMAL HIGH (ref 11.5–15.5)
WBC: 12.9 10*3/uL — ABNORMAL HIGH (ref 4.0–10.5)
nRBC: 0.2 % (ref 0.0–0.2)

## 2019-02-15 LAB — BASIC METABOLIC PANEL
Anion gap: 10 (ref 5–15)
BUN: 49 mg/dL — ABNORMAL HIGH (ref 6–20)
CO2: 22 mmol/L (ref 22–32)
Calcium: 8.5 mg/dL — ABNORMAL LOW (ref 8.9–10.3)
Chloride: 117 mmol/L — ABNORMAL HIGH (ref 98–111)
Creatinine, Ser: 4.11 mg/dL — ABNORMAL HIGH (ref 0.61–1.24)
GFR calc Af Amer: 17 mL/min — ABNORMAL LOW (ref >60)
GFR calc non Af Amer: 15 mL/min — ABNORMAL LOW (ref >60)
Glucose, Bld: 158 mg/dL — ABNORMAL HIGH (ref 70–99)
Potassium: 3.8 mmol/L (ref 3.5–5.1)
Sodium: 149 mmol/L — ABNORMAL HIGH (ref 135–145)

## 2019-02-15 LAB — GLUCOSE, CAPILLARY
Glucose-Capillary: 122 mg/dL — ABNORMAL HIGH (ref 70–99)
Glucose-Capillary: 131 mg/dL — ABNORMAL HIGH (ref 70–99)
Glucose-Capillary: 135 mg/dL — ABNORMAL HIGH (ref 70–99)
Glucose-Capillary: 137 mg/dL — ABNORMAL HIGH (ref 70–99)
Glucose-Capillary: 142 mg/dL — ABNORMAL HIGH (ref 70–99)
Glucose-Capillary: 160 mg/dL — ABNORMAL HIGH (ref 70–99)

## 2019-02-15 LAB — URINE CULTURE: Culture: NO GROWTH

## 2019-02-15 MED ORDER — TAMSULOSIN HCL 0.4 MG PO CAPS
0.4000 mg | ORAL_CAPSULE | Freq: Every day | ORAL | Status: DC
  Administered 2019-02-16 – 2019-02-18 (×3): 0.4 mg via ORAL
  Filled 2019-02-15 (×4): qty 1

## 2019-02-15 MED ORDER — BISACODYL 10 MG RE SUPP: 10.0000 mg | Freq: Every day | RECTAL | Status: DC | PRN

## 2019-02-15 MED ORDER — GLYCERIN (LAXATIVE) 2.1 G RE SUPP
1.0000 | Freq: Every day | RECTAL | Status: DC | PRN
  Filled 2019-02-15: qty 1

## 2019-02-15 MED ORDER — BISACODYL 10 MG RE SUPP: 10.0000 mg | Freq: Once | RECTAL | Status: DC

## 2019-02-15 NOTE — Progress Notes (Signed)
Nutrition Follow-up  DOCUMENTATION CODES:   Not applicable  INTERVENTION:  Continue comfort feeds.  Dysphagia 1 diet with thin liquids. Magic cup TID with meals, each supplement provides 290 kcal and 9 grams of protein  NUTRITION DIAGNOSIS:   Inadequate oral intake related to inability to eat as evidenced by NPO status; ongoing  GOAL:   Patient will meet greater than or equal to 90% of their needs; not met  MONITOR:   PO intake, Supplement acceptance, Labs, Weight trends, I & O's, Skin  REASON FOR ASSESSMENT:   Consult Enteral/tube feeding initiation and management  ASSESSMENT:   63 y.o. male presenting with AMS after being found down. PMH is significant for cervical dystonia, dysarthria, HLD, gait abnormality, h/o myelopathy. 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  Per Palliative care, pt and family desire no artifical feeding and feeding tube. Goals of care include continuation to treat the treatable. Family in agreement in comfort feeds with known risk of aspiration. Pt DNR. Pt currently on a dysphagia 1 diet with thin liquids. RD to provide magic cup at meals to aid in intake.   Labs and medications reviewed. Sodium elevated at 149. Chloride elevated at 117. IV fluids infusing.  Diet Order:   Diet Order            DIET - DYS 1 Room service appropriate? Yes; Fluid consistency: Thin  Diet effective now              EDUCATION NEEDS:   Not appropriate for education at this time  Skin:  Skin Assessment: Reviewed RN Assessment  Last BM:  4/21  Height:   Ht Readings from Last 1 Encounters:  02/11/19 _0  (1.676 m)    Weight:   Wt Readings from Last 1 Encounters:  02/15/19 67.2 kg    Ideal Body Weight:  64.5 kg  BMI:  Body mass index is 23.91 kg/m.  Estimated Nutritional Needs:   Kcal:  5615-4884  Protein:  90-100 grams  Fluid:  1.9 - 2.1 L/day    Corrin Parker, MS, RD, LDN Pager # [phone] After hours/ weekend pager # [phone]

## 2019-02-15 NOTE — Progress Notes (Signed)
Provider notified of updated BMP.

## 2019-02-15 NOTE — Progress Notes (Signed)
Physical Therapy Treatment Patient Details Name: Patrick Daniel MRN: [id number] DOB: 1956/07/25 Today's Date: 02/15/2019    History of Present Illness 63 y.o. male presenting with AMS after being found down at [facility]. PMH is significant for cervical dystonia, dysarthria, HLD, gait abnormality (?CNS degenerative disorder?). Patient unable to provide insight to what caused fall given chronic speech issues that have acutely worsened. Known mobility issues at baseline with frequent falls. Concern for traumatic head injury after fall vs stroke vs seizure. Head CT with temporal lobe edema. MRI with concern for acute or early subacute infarct vs infection like HSV. Pt also found to have comminuted fx of L humeral neck, ortho following.     PT Comments    Pt progressing towards physical therapy goals. Pt with decreased attention while sitting EOB. Pt grabbing at lines and trying to take his sling off - was able to focus to complete some seated therapeutic exercise, however when not engaged in an activity, he is attempting to pull at lines/tubes/sling. Overall requiring up to max assist to maintain sitting balance as he was making minimal attempts to maintain his posture. Will continue to follow and progress as able per POC.    Follow Up Recommendations  SNF;Supervision/Assistance - 24 hour     Equipment Recommendations  Other (comment)(TBD by next venue of care)    Recommendations for Other Services       Precautions / Restrictions Precautions Precautions: Fall;Shoulder;Other (comment) Type of Shoulder Precautions: sling, NWB  Shoulder Interventions: Shoulder sling/immobilizer Required Braces or Orthoses: Sling Restrictions Weight Bearing Restrictions: Yes LUE Weight Bearing: Non weight bearing    Mobility  Bed Mobility Overal bed mobility: Needs Assistance Bed Mobility: Sidelying to Sit;Supine to Sit;Sit to Supine   Sidelying to sit: +2 for physical assistance;Mod assist Supine to sit: Mod  assist;+2 for physical assistance Sit to supine: Mod assist;+2 for physical assistance   General bed mobility comments: Assist required, however pt with increased participation with transition to/from EOB this session.   Transfers Overall transfer level: Needs assistance Equipment used: 2 person hand held assist Transfers: Sit to/from [employer] Sit to Stand: Mod assist;+2 physical assistance   Squat pivot transfers: Mod assist;+2 physical assistance     General transfer comment: Continues to be unable to advance feet to take steps  Ambulation/Gait             General Gait Details: Unable to progress to ambulation this session.    Stairs             Wheelchair Mobility    Modified Rankin (Stroke Patients Only)       Balance Overall balance assessment: Needs assistance Sitting-balance support: Feet supported Sitting balance-Leahy Scale: Poor Sitting balance - Comments: Mod to max assist required at all times for sitting balance EOB. Heavy R lateral lean Postural control: Right lateral lean                                  Cognition Arousal/Alertness: Awake/alert Behavior During Therapy: WFL for tasks assessed/performed;Flat affect Overall Cognitive Status: Impaired/Different from baseline Area of Impairment: Attention;Following commands;Safety/judgement;Awareness;Problem solving                   Current Attention Level: Focused   Following Commands: Follows one step commands inconsistently;Follows one step commands with increased time Safety/Judgement: Decreased awareness of deficits Awareness: Intellectual Problem Solving: Difficulty sequencing;Requires verbal cues;Requires tactile cues;Slow processing;Decreased  initiation        Exercises General Exercises - Lower Extremity Long Arc Quad: 10 reps;Both Hip Flexion/Marching: Seated;10 reps;Both    General Comments        Pertinent Vitals/Pain Pain  Assessment: No/denies pain Faces Pain Scale: No hurt Pain Intervention(s): Monitored during session    Home Living                      Prior Function            PT Goals (current goals can now be found in the care plan section) Acute Rehab PT Goals Patient Stated Goal: none stated today, agreeable to working  with therapies PT Goal Formulation: Patient unable to participate in goal setting Time For Goal Achievement: 02/24/19 Potential to Achieve Goals: Good Progress towards PT goals: Progressing toward goals    Frequency    Min 3X/week      PT Plan Current plan remains appropriate    Co-evaluation              AM-PAC PT "6 Clicks" Mobility   Outcome Measure  Help needed turning from your back to your side while in a flat bed without using bedrails?: A Lot Help needed moving from lying on your back to sitting on the side of a flat bed without using bedrails?: A Lot Help needed moving to and from a bed to a chair (including a wheelchair)?: A Lot Help needed standing up from a chair using your arms (e.g., wheelchair or bedside chair)?: A Lot Help needed to walk in hospital room?: Total Help needed climbing 3-5 steps with a railing? : Total 6 Click Score: 10    End of Session Equipment Utilized During Treatment: Oxygen;Gait belt Activity Tolerance: Patient tolerated treatment well Patient left: in chair;with call bell/phone within reach;with chair alarm set;with nursing/sitter in room Nurse Communication: Mobility status PT Visit Diagnosis: Muscle weakness (generalized) (M62.81);History of falling (Z91.81);Difficulty in walking, not elsewhere classified (R26.2);Pain Pain - Right/Left: Left Pain - part of body: Shoulder     Time: 8088-1103 PT Time Calculation (min) (ACUTE ONLY): 34 min  Charges:  $Gait Training: 23-37 mins                     Rolinda Roan, PT, DPT Acute Rehabilitation Services Pager: [phone] Office: [phone]     Thelma Comp 02/15/2019, 11:35 AM

## 2019-02-15 NOTE — Progress Notes (Signed)
[facility] for Infectious Disease  Date of Admission:  02/09/2019     Total days of antibiotics 7 Linezolid day 3 Acyclovir, day 6           Patient ID: Patrick Daniel is a 63 y.o. 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 chlorhexidine  15 mL Mouth Rinse BID  . hydrALAZINE  20 mg Intravenous Q8H  . mouth rinse  15 mL Mouth Rinse q12n4p  . tamsulosin  0.4 mg Oral Daily    SUBJECTIVE: Reports he is "doing pretty good" today. He otherwise states no complaints. Overnight with improvement    Review of Systems: Review of Systems  Unable to perform ROS: Mental status change    Allergies  Allergen Reactions  . Baclofen Other (See Comments)    Weakness, drowsiness    OBJECTIVE: Vitals:   02/15/19 0453 02/15/19 0736 02/15/19 1144 02/15/19 1255  BP: 107/78 140/87 (!) 136/104 (!) 143/86  Pulse: (!) 105 (!) 115 (!) 108 (!) 111  Resp: 17 20 17    Temp: (!) 97.4 F (36.3 C) (!) 97.5 F (36.4 C) 98.6 F (37 C)   TempSrc: Oral Oral Oral   SpO2: 99% 98% 100%   Weight: 67.2 kg     Height:       Body mass index is 23.91 kg/m.  Physical Exam Constitutional:      Comments: Sitting up in recliner, he is very alert. Shakes head no and yes appropriately. Attempts to verbalize but most speech is unintelligible.   HENT:     Mouth/Throat:     Mouth: Mucous membranes are dry.     Comments: Thick, dry secretions noted throughout the mouth with sloughed debris. No ulcerations appreciated necessarily however large amount of dry/sticky debris. Very poor dentition.  Eyes:     General: No scleral icterus.    Pupils: Pupils are equal, round, and reactive to light.  Cardiovascular:     Rate and Rhythm:  Tachycardia present.     Pulses: Normal pulses.     Heart sounds: No murmur.  Pulmonary:     Effort: Pulmonary effort is normal.     Breath sounds: Normal breath sounds.  Abdominal:     General: There is no distension.     Palpations: Abdomen is soft.  Skin:    General: Skin is warm and dry.     Capillary Refill: Capillary refill takes less than 2 seconds.     Comments: Multiple scabbed abrasions on knees. All healing w/o signs of infection.   Neurological:     Comments: Neurologically he has improved significantly. He is able to shake head yes and no appropriately. Attempting to communicate more verbally with difficult to understand and often intelligible speech.      Lab Results Lab Results  Component Value Date   WBC 12.9 (H) 02/15/2019   HGB 7.4 (L) 02/15/2019   HCT 23.0 (L) 02/15/2019   MCV 98.3 02/15/2019   PLT 307 02/15/2019    Lab Results  Component Value Date   CREATININE 4.11 (H) 02/15/2019   BUN 49 (H) 02/15/2019   NA 149 (H) 02/15/2019   K 3.8 02/15/2019   CL  117 (H) 02/15/2019   CO2 22 02/15/2019    Lab Results  Component Value Date   ALT 39 02/11/2019   AST 51 (H) 02/11/2019   ALKPHOS 55 02/11/2019   BILITOT 1.3 (H) 02/11/2019     Microbiology: Recent Results (from the past 240 hour(s))  Blood culture (routine x 2)     Status: Abnormal   Collection Time: 02/09/19  5:08 PM  Result Value Ref Range Status   Specimen Description BLOOD RIGHT ANTECUBITAL  Final   Special Requests   Final    BOTTLES DRAWN AEROBIC AND ANAEROBIC Blood Culture results may not be optimal due to an inadequate volume of blood received in culture bottles   Culture  Setup Time   Final    GRAM POSITIVE COCCI IN CLUSTERS IN BOTH AEROBIC AND ANAEROBIC BOTTLES CRITICAL RESULT CALLED TO, READ BACK BY AND VERIFIED WITH: PHARMD ERIN DEJA AT 7425 ON 02/10/2019 BY MHOUEGNIFIO    Culture (A)  Final    STAPHYLOCOCCUS SPECIES (COAGULASE NEGATIVE) THE SIGNIFICANCE OF ISOLATING THIS  ORGANISM FROM A SINGLE SET OF BLOOD CULTURES WHEN MULTIPLE SETS ARE DRAWN IS UNCERTAIN. PLEASE NOTIFY THE MICROBIOLOGY DEPARTMENT WITHIN ONE WEEK IF SPECIATION AND SENSITIVITIES ARE REQUIRED. Performed at [facility], [place] [address], [place], [place] [address]    Report Status 02/12/2019 FINAL  Final  Blood Culture ID Panel (Reflexed)     Status: Abnormal   Collection Time: 02/09/19  5:08 PM  Result Value Ref Range Status   Enterococcus species NOT DETECTED NOT DETECTED Final   Listeria monocytogenes NOT DETECTED NOT DETECTED Final   Staphylococcus species DETECTED (A) NOT DETECTED Final    Comment: Methicillin (oxacillin) resistant coagulase negative staphylococcus. Possible blood culture contaminant (unless isolated from more than one blood culture draw or clinical case suggests pathogenicity). No antibiotic treatment is indicated for blood  culture contaminants. CRITICAL RESULT CALLED TO, READ BACK BY AND VERIFIED WITH: PHARMD ERIN DEJA AT 1332 ON 02/10/2019 BY MHOUEGNIFIO    Staphylococcus aureus (BCID) NOT DETECTED NOT DETECTED Final   Methicillin resistance DETECTED (A) NOT DETECTED Final    Comment: CRITICAL RESULT CALLED TO, READ BACK BY AND VERIFIED WITH: PHARMD ERIN DEJA AT 1332 ON 02/10/2019 BY MHOUEGNIFIO    Streptococcus species NOT DETECTED NOT DETECTED Final   Streptococcus agalactiae NOT DETECTED NOT DETECTED Final   Streptococcus pneumoniae NOT DETECTED NOT DETECTED Final   Streptococcus pyogenes NOT DETECTED NOT DETECTED Final   Acinetobacter baumannii NOT DETECTED NOT DETECTED Final   Enterobacteriaceae species NOT DETECTED NOT DETECTED Final   Enterobacter cloacae complex NOT DETECTED NOT DETECTED Final   Escherichia coli NOT DETECTED NOT DETECTED Final   Klebsiella oxytoca NOT DETECTED NOT DETECTED Final   Klebsiella pneumoniae NOT DETECTED NOT DETECTED Final   Proteus species NOT DETECTED NOT DETECTED Final   Serratia marcescens NOT DETECTED NOT DETECTED  Final   Haemophilus influenzae NOT DETECTED NOT DETECTED Final   Neisseria meningitidis NOT DETECTED NOT DETECTED Final   Pseudomonas aeruginosa NOT DETECTED NOT DETECTED Final   Candida albicans NOT DETECTED NOT DETECTED Final   Candida glabrata NOT DETECTED NOT DETECTED Final   Candida krusei NOT DETECTED NOT DETECTED Final   Candida parapsilosis NOT DETECTED NOT DETECTED Final   Candida tropicalis NOT DETECTED NOT DETECTED Final    Comment: Performed at [facility], [place]. [address] [place], [place] [address]  Blood culture (routine x 2)     Status: None   Collection  Time: 02/09/19  5:15 PM  Result Value Ref Range Status   Specimen Description BLOOD LEFT ANTECUBITAL  Final   Special Requests   Final    BOTTLES DRAWN AEROBIC AND ANAEROBIC Blood Culture adequate volume   Culture   Final    NO GROWTH 5 DAYS Performed at [facility], [address], [place], [place] [address]    Report Status 02/14/2019 FINAL  Final  CSF culture     Status: None   Collection Time: 02/09/19 10:45 PM  Result Value Ref Range Status   Specimen Description CSF  Final   Special Requests Normal  Final   Gram Stain   Final    WBC PRESENT, PREDOMINANTLY MONONUCLEAR NO ORGANISMS SEEN CYTOSPIN SMEAR    Culture   Final    NO GROWTH Performed at [facility], [place] [address] [place], [place] [address]    Report Status 02/13/2019 FINAL  Final  Culture, fungus without smear     Status: None (Preliminary result)   Collection Time: 02/09/19 10:45 PM  Result Value Ref Range Status   Specimen Description CSF  Final   Special Requests Normal  Final   Culture   Final    NO FUNGUS ISOLATED AFTER 7 DAYS Performed at [facility], [place] [address], [place], [place] [address]    Report Status PENDING  Incomplete  Anaerobic culture     Status: None   Collection Time: 02/09/19 10:45 PM  Result Value Ref Range Status   Specimen Description CSF  Final   Special Requests Normal  Final    Culture   Final    NO ANAEROBES ISOLATED Performed at [facility], [place] [address], [place], [place] [address]    Report Status 02/14/2019 FINAL  Final  Culture, blood (routine x 2)     Status: None (Preliminary result)   Collection Time: 02/11/19  9:01 AM  Result Value Ref Range Status   Specimen Description BLOOD RIGHT ANTECUBITAL  Final   Special Requests   Final    BOTTLES DRAWN AEROBIC ONLY Blood Culture adequate volume   Culture   Final    NO GROWTH 4 DAYS Performed at [facility], [place] [address], [place], [place] [address]    Report Status PENDING  Incomplete  Culture, blood (routine x 2)     Status: None (Preliminary result)   Collection Time: 02/11/19  9:01 AM  Result Value Ref Range Status   Specimen Description BLOOD RIGHT ARM  Final   Special Requests   Final    BOTTLES DRAWN AEROBIC ONLY Blood Culture adequate volume   Culture   Final    NO GROWTH 4 DAYS Performed at [facility], [address], [place], [place] [address]    Report Status PENDING  Incomplete  Culture, Urine     Status: None   Collection Time: 02/14/19  2:47 PM  Result Value Ref Range Status   Specimen Description URINE, CATHETERIZED  Final   Special Requests NONE  Final   Culture   Final    NO GROWTH Performed at [facility], [address], [place], [place] [address]    Report Status 02/15/2019 FINAL  Final     ASSESSMENT & PLAN:  1. 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 He had been recovering nicely with IV acyclovir until AKI. Unfortunately we need to continue acyclovir 10  mg/kg Q24 as long as he maintains good urine output. The risk of discontinuing early would be detrimental to his treatment and worrisome for poor outcome in treating his encephalitis. Would kindly ask Speech Therapy team to assess every day for readiness to resume pills to transition to valtrex PO (maybe morning session  as he usually seems to be more cooperative then).   2. Methicillin Resistant CoNS Bacteremia = 1/2 blood cultures positive for MR-CoNS likely arising from open wounds on skin from his prolonged time he was on the floor PTA. Given his multiple open lesions may be transient bacteremia vs skin contaminant from draw. Cleared quickly and treated with 7d anti-staphylococcal coverage.   3. Medication Monitoring = Continue to check BMP daily while on IV acyclovir given risk for crystal-induced nephropathy with medication.   4. Rhabdo/AKI = Creatinine continues to increase now 4.11. He is off vancomycin (although trough level was low so less likely due directly to this). Likely poor intake and hypovolemia has contributed significantly to crystal-mediated nephritis . Agree with nephrology consult. Would favor IVFs with some solute as this will assist in dissolving likely crystal-induced nephropathy from IV acyclovir (D5 1/2NS?)  Janene Madeira, MSN, NP-C [facility] for Infectious Disease [email or web address] Pager: [phone] Office: [phone] [facility] Main Line: [phone]    02/15/2019  1:52 PM

## 2019-02-15 NOTE — Care Management Important Message (Signed)
Important Message  Patient Details  Name: Patrick Daniel MRN: [id number] Date of Birth: Dec 30, 1955   Medicare Important Message Given:  Yes    [name] 02/15/2019, 2:22 PM

## 2019-02-15 NOTE — Progress Notes (Signed)
SLP Cancellation Note  Patient Details Name: Patrick Daniel MRN: [id number] DOB: [birth year]   Cancelled treatment:       Reason Eval/Treat Not Completed: Patient declined, no reason specified. Patient was confused and mildly agitated today, pulling at mitts on hands and seeming to try to pull at catheter. Although he was less dysarthric yesterday, with SLP able to understand short phrases, today his intelligibility is approximately 10%. Patient would not respond with a yes/no when SLP asking him about oral care and ice chip trials, but he would actively refuse, was easily distracted (by TV, etc). Patient's oral cavity was significantly more clean with much less dried secretions than have been noted in previous sessions. Patient continues to be at a severe risk for aspiration, continues to be NPO (he pulled out NG tube a few days ago) and at this time, progress has been very slow. Hopefully patient's cognition will improve and stabilize so he can better participate in therapy and more effectively communicate his wants/needs.    [name] 02/15/2019, 2:53 PM   Sonia Baller, MA, [place] [facility] Pager: [phone]

## 2019-02-15 NOTE — Progress Notes (Signed)
Family Medicine Teaching Service Daily Progress Note Intern Pager: [phone]  Patient name: Patrick Daniel Medical record number: [id number] Date of birth: 03-12-1956 Age: 63 y.o. Gender: male  Primary Care Provider: Marco Collie, MD Consultants: neuro, ID, orthopedics (s/o) Code Status: FULL  Pt Overview and Major Events to Date:  4/15 admitted having been found down, AMS, neuro consulted, LP suggestive of HSV encephalitis, acyclovir started, 1/2 Blood cx positive for Coag neg Staph, CSF cx NGTD, CSF fungal NGTD, Anaerobic cx NGTD 4/16 Cefepime discontinued  4/17 Repeat blood cultures  4/18 Vanc Discontinued, ID added linezolid 4/20 acute renal failure, nephro consulted, pt unable to keep NG down  Antimicrobials: Vancomycin 4/15-4/18 Cefepime 4/15-4/16 Acyclovir 4/15- Linezolid 4/18-  Assessment and Plan: Patrick Daniel is a 63 y.o. male presenting with AMS after being found down. PMH is significant for cervical dystonia, dysarthria, HLD, gait abnormality, h/o myelopathy.  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 Also grew possible CoNS contaminant, ID rec linezolid to end 4/21.  - continue acyclovir x 21 days (4/16-) per neurology and ID -vanc changed to linezolid on 4/18, ID rec end date 4/21 - PT/OT recommending SNF  - F/u SLP recs, continued NPO with NG - Tylenol PRN for fever/pain - AM CBC, BMP  Severe pharyngeal dysphasia related to above. Pt has thus far pulled out 2 NGs despite mittens. We would like to avoid TPN, but pt has been NPO now for 6 days.  -attempt NG placement again today -appreciate palliative recs for feeding plans   AKI Cr 0.87>>4.1 this am. Likely multifactorial given NPO, vanc, acyclovir, urinary retention. - recheck am Cr - continue IVF as below - consulted nephrology, appreciate recs  Hypernatremia:  Na 149>155>152>153>149. Likely 2/2 NPO and inadequate fluid intake. UOP 1621ml last 24 hours.  -continue d5w -recheck Na in am   -strict I/o  -continue foley for retention and AKI  Anemia: Hemoglobin stable 7.4. Iron studies suggestive of anemia of chronic disease, retics likely elevated in the setting of acute illness.   -Daily CBC  ?Constipation: RN reports small BM possibly this morning, but no normal BM charted.  -glycerin chip PRN    Elevated BP with probably h/o HTN Intermittently hypertensive, normotensive this am. - continue hydralazine IV 20mg  q8h schedule while NPO, could change to norvasc when taking po    L humeral neck fracture- mildly comminuted fx of L humeral neck noticed w/ significant bruising to LUE secondary to fall at home. Patient denies pain this morning. - ortho recommended sling for now, could consider reverse total shoulder arthroplasty after clear of infection if still symptomatic at that time. Follow up outpt - PRN tylenol, monitor for pain  FEN/GI: NPO  Prophylaxis: SCDs, no pharm DVT ppx d/t blood in ventricles on MRI brain   Disposition: pending medical management  Subjective:  Pt with 2 intelligible sentences at the start of our conversation, picking at his mittens throughout. Denies pain in L shoulder. States not hungry. Confused when I attempted to explain foley. RN reports he was biting at his IV line as well.   Objective: Temp:  [97.4 F (36.3 C)-98.3 F (36.8 C)] 97.5 F (36.4 C) (04/21 0736) Pulse Rate:  [102-116] 115 (04/21 0736) Resp:  [16-20] 20 (04/21 0736) BP: (107-151)/(60-87) 140/87 (04/21 0736) SpO2:  [96 %-100 %] 98 % (04/21 0736) Weight:  [67.2 kg] 67.2 kg (04/21 0453)   Physical Exam:  General: Laying in bed with mitts on, in NAD Cardiovascular: Slightly tachycardic,  regular rhythm.  Normal S1 and S2. No murmurs Respiratory: Normal work of breathing with appropriate saturations on room air.  Clear to auscultation bilaterally. Abdomen: Soft, nontender, nondistended, + bowel sounds  Extremities:  Bilateral lower extremities without swelling or  erythema. Neuro: awake and alert.  Laboratory: Recent Labs  Lab 02/13/19 0657 02/14/19 0508 02/15/19 0324  WBC 13.8* 13.3* 12.9*  HGB 8.3* 7.5* 7.4*  HCT 25.4* 23.8* 23.0*  PLT 357 338 307   Recent Labs  Lab 02/09/19 1529  02/11/19 0342  02/14/19 0508 02/14/19 1931 02/15/19 0324  NA 138   < > 149*   < > 153* 151* 149*  K 4.6   < > 3.4*   < > 3.8 3.7 3.8  CL 97*   < > 117*   < > 120* 117* 117*  CO2 21*   < > 21*   < > 21* 21* 22  BUN 81*   < > 26*   < > 44* 49* 49*  CREATININE 1.38*   < > 0.94   < > 3.60* 4.11* 4.11*  CALCIUM 9.4   < > 8.2*   < > 8.4* 8.3* 8.5*  PROT 8.1  --  6.0*  --   --   --   --   BILITOT 1.9*  --  1.3*  --   --   --   --   ALKPHOS 68  --  55  --   --   --   --   ALT 46*  --  39  --   --   --   --   AST 54*  --  51*  --   --   --   --   GLUCOSE 143*   < > 108*   < > 163* 120* 158*   < > = values in this interval not displayed.   Iron/TIBC/Ferritin/ %Sat    Component Value Date/Time   IRON 51 02/11/2019 0342   TIBC 230 (L) 02/11/2019 0342   FERRITIN 327 02/11/2019 1350   IRONPCTSAT 22 02/11/2019 0342    Imaging/Diagnostic Tests: No results found.  Sela Hilding, MD 02/15/2019, 7:49 AM PGY-3, [place] Intern pager: [phone], text pages welcome

## 2019-02-15 NOTE — Consult Note (Signed)
Consultation Note Date: 02/15/2019   Patient Name: Patrick Daniel  DOB: 1956-07-19  MRN: [id number]  Age / Sex: 63 y.o., male  PCP: Marco Collie, MD Referring Physician: Zenia Resides, MD  Reason for Consultation: Establishing goals of care and Psychosocial/spiritual support  HPI/Patient Profile: 63 y.o. male   admitted on 02/09/2019 with with a past medical history of cervical dystonia, dysarthria, HLD, gait abnormality  who presented to the hospital after being found down after a fall and  confused.  According to his sister he lives independently.  Admitted for stabilization and work-up.  Neurology and nephrology consulted.  Noted left humeral neck fracture arm in sling,  confirmed with CSF  HSV encephalitis, now on acyclovir.  Acute kidney injury, creatinine 4.1.  Severe pharyngeal dysphasia per speech therapy.  Significant dysarthria, patient's speeech is garbled.  Family face treatment option decisions, advanced directive decisions and anticipatory care needs.   Clinical Assessment and Goals of Care:   This NP Wadie Lessen reviewed medical records, received report from team, assessed the patient and then meet at the patient's bedside and spoke by phone to both patient's sister/ Brayton Layman and daughter/ Payam Gribble [phone]  to discuss diagnosis, prognosis, GOC, EOL wishes disposition and options.  Concept of Hospice and Palliative Care were discussed  A detailed discussion was had today regarding advanced directives.  Concepts specific to code status, artifical feeding and hydration, continued IV antibiotics and rehospitalization was had.  The difference between a aggressive medical intervention path  and a palliative comfort care path for this patient at this time was had.  Values and goals of care important to patient and family were attempted to be elicited.  Both the patient's sister and  daughter verbalize a clear understanding that the patient  -would not want a feeding tube now or in the future, he would not want resuscitation.  Both agree that the patient would not want initiation of dialysis if it ever came to that.  The patient has verbalized his frustration and dissatisfaction with the quality of life that he has suffered secondary to his multiple comorbidities over the past many years.  His desire would be for a natural death.  Questions and concerns addressed.   Family encouraged to call with questions or concerns.    PMT will continue to support holistically.     No documented HPOA, his sister and only daughter work together for medical decisions acting in Mr Clauss best interest.   SUMMARY OF RECOMMENDATIONS    Code Status/Advance Care Planning:  DNRdocumented today  No artificial feeding now or in the future  No desire for initiation of dialysis even if indicated into the future.  Continue to treat the treatable and hope for improvement.   Symptom Management:   Dysphagia: Family in agreement to comfort feeds with known risk of aspiration.  Palliative Prophylaxis:   Aspiration, Bowel Regimen, Delirium Protocol, Frequent Pain Assessment and Oral Care  Additional Recommendations (Limitations, Scope, Preferences):  Avoid Hospitalization, Initiate Comfort Feeding, No Artificial Feeding  and No Tracheostomy  Psycho-social/Spiritual:   Desire for further Chaplaincy support:no  Additional Recommendations: Created space and opportunity for family to verbalize their thoughts and feelings regarding the patient's current medical situation.  This is especially difficult for them and that they cannot come to the hospital to visit with the patient.  The daughter lives in [place] and wishes she could be closer to her father  Face timed with sister and telephone call with daughter    Prognosis:   Unable to determine  Discharge Planning: To Be Determined       Primary Diagnoses: Present on Admission: . Altered mental status   I have reviewed the medical record, interviewed the patient and family, and examined the patient. The following aspects are pertinent.  Past Medical History:  Diagnosis Date  . Dysarthria   . Dystonia   . Gait disturbance   . Myelopathy ([place])   . Paraplegia [facility])    Social History   Socioeconomic History  . Marital status: Single    Spouse name: Not on file  . Number of children: 1  . Years of education: Not on file  . Highest education level: Not on file  Occupational History  . Occupation: retired    Comment: Biomedical scientist  . Financial resource strain: Not on file  . Food insecurity:    Worry: Not on file    Inability: Not on file  . Transportation needs:    Medical: Not on file    Non-medical: Not on file  Tobacco Use  . Smoking status: Former Smoker    Types: Cigarettes    Last attempt to quit: 02/01/2013    Years since quitting: 6.0  . Smokeless tobacco: Never Used  . Tobacco comment: quit   Substance and Sexual Activity  . Alcohol use: No  . Drug use: No  . Sexual activity: Not on file  Lifestyle  . Physical activity:    Days per week: Not on file    Minutes per session: Not on file  . Stress: Not on file  Relationships  . Social connections:    Talks on phone: Not on file    Gets together: Not on file    Attends religious service: Not on file    Active member of club or organization: Not on file    Attends meetings of clubs or organizations: Not on file    Relationship status: Not on file  Other Topics Concern  . Not on file  Social History Narrative   Patient lives in independent living [place]. Patient is retired from [employer]. Patient has one child. Right handed. Patient is divorced.   Caffeine- 12 - Coffee daily - one daily -soda   Family History  Problem Relation Age of Onset  . Heart disease Mother   . Cancer Mother   . Heart Problems Father   . High  Cholesterol Sister   . High blood pressure Sister   . Diabetes Sister   . High blood pressure Sister    Scheduled Meds: . chlorhexidine  15 mL Mouth Rinse BID  . hydrALAZINE  20 mg Intravenous Q8H  . lidocaine  10 mL Intradermal Once  . mouth rinse  15 mL Mouth Rinse q12n4p   Continuous Infusions: . acyclovir Stopped (02/15/19 0711)  . dextrose 150 mL/hr at 02/15/19 0755  . linezolid (ZYVOX) IV 300 mL/hr at 02/15/19 0045   PRN Meds:.acetaminophen Medications Prior to Admission:  Prior to Admission medications  Medication Sig Start Date End Date Taking? Authorizing Provider  clonazePAM (KLONOPIN) 0.5 MG tablet TAKE 1 TO 2 TABLETS BY MOUTH AT NIGHT AS NEEDED Patient taking differently: Take 0.25-0.5 mg by mouth at bedtime. sleep 01/17/19  Yes Marcial Pacas, MD  doxycycline (PERIOSTAT) 20 MG tablet Take 20 mg by mouth daily.   Yes [provider]  DULoxetine (CYMBALTA) 60 MG capsule TAKE 1 CAPSULE(60 MG) BY MOUTH DAILY Patient taking differently: Take 60 mg by mouth daily.  12/10/18  Yes Marcial Pacas, MD  mometasone (ELOCON) 0.1 % cream Apply 1 application topically daily as needed (DRY SKIN).     [provider]   Allergies  Allergen Reactions  . Baclofen Other (See Comments)    Weakness, drowsiness   Review of Systems  Unable to perform ROS: Acuity of condition    Physical Exam Cardiovascular:     Rate and Rhythm: Tachycardia present.  Pulmonary:     Breath sounds: Decreased air movement present.  Musculoskeletal:     Comments: -Generalized weakness  -Left arm secured in sling noted bruising over shoulder area  Skin:    General: Skin is cool.     Vital Signs: BP 140/87 (BP Location: Right Arm)   Pulse (!) 115   Temp (!) 97.5 F (36.4 C) (Oral)   Resp 20   Ht 5\' 6"  (1.676 m)   Wt 67.2 kg   SpO2 98%   BMI 23.91 kg/m  Pain Scale: 0-10   Pain Score: 0-No pain   SpO2: SpO2: 98 % O2 Device:SpO2: 98 % O2 Flow Rate: .O2 Flow Rate (L/min): 2 L/min   IO: Intake/output summary:   Intake/Output Summary (Last 24 hours) at 02/15/2019 0811 Last data filed at 02/15/2019 0755 Gross per 24 hour  Intake 3394.39 ml  Output 1600 ml  Net 1794.39 ml    LBM:   Baseline Weight: Weight: 65 kg Most recent weight: Weight: 67.2 kg     Palliative Assessment/Data:  30 % at best   Discussed with Family Medic-ne  Time In: 1100 Time Out: 1230 Time Total: 90 minutes Greater than 50%  of this time was spent counseling and coordinating care related to the above assessment and plan.  Signed by: Wadie Lessen, NP   Please contact Palliative Medicine Team phone at [phone] for questions and concerns.  For individual provider: See Shea Evans

## 2019-02-15 NOTE — Progress Notes (Signed)
[facility] Progress Note  Name: Patrick Daniel MRN: [id number] DOB: 04/19/1956  Chief Complaint:  Found down   Subjective:  Spoke with palliative care. They have been consulted per primary and they spoke with his family - they discussed comfort feeds for now with additional goals to be addressed ongoing basis.  Note 1.6 liters UOP over 4/20 charted.   Review of systems:  Unable to obtain 2/2 AMs and dysarthria   ------------- Background on consult:  Patrick Daniel is a 63 y.o. male with a past medical history of cervical dystonia who presented to the hospital after being found down after a fall; he was also confused.  Individuals at his independent living facility had noted that his packages were stacking up and alerted staff who located the patient.  He has developed AKI over the course of his hospitalization and nephrology is consulted for assistance with management of the same.  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  He has had issues with dysphasia and note that per infectious disease he is possibly candidate to complete the remainder of his 3-week treatment with p.o. Valtrex once he is able to swallow pills.  He was on vancomycin for coag negative staph bacteremia and ID reported plans to switch to linezolid in the setting of his AKI.  CT earlier this admission is negative for hydro.  He has also developed hypernatremia and per charting has pulled out multiple feeding tubes.  He had his Foley catheter placed earlier today and reportedly had also pulled one out previously.  Per the nurse, this was a difficult Foley placement and he had 950 mL in his bladder prior to Foley.  Per nursing his sister is his contact Patrick Daniel at [phone].    Intake/Output Summary (Last 24 hours) at 02/15/2019 1113 Last data filed at 02/15/2019 0755 Gross per 24 hour  Intake 3394.39 ml  Output 1600 ml  Net 1794.39 ml    Vitals:  Vitals:   02/14/19 2029  02/15/19 0022 02/15/19 0453 02/15/19 0736  BP: (!) 151/80 125/60 107/78 140/87  Pulse: (!) 116 (!) 112 (!) 105 (!) 115  Resp: 17 16 17 20   Temp: 98.2 F (36.8 C) 98 F (36.7 C) (!) 97.4 F (36.3 C) (!) 97.5 F (36.4 C)  TempSrc: Oral Oral Oral Oral  SpO2: 98% 96% 99% 98%  Weight:   67.2 kg   Height:         Physical Exam:  General elderly male in bed in no acute distress. Chronically ill appearing Lungs clear to auscultation bilaterally normal work of breathing at rest  Heart tachycardia; no rub Abdomen soft nontender nondistended Extremities no edema; mittens Psych calm on exam  Neuro dysarthria    Medications reviewed   Labs:  BMP Latest Ref Rng & Units 02/15/2019 02/14/2019 02/14/2019  Glucose 70 - 99 mg/dL 158(H) 120(H) 163(H)  BUN 6 - 20 mg/dL 49(H) 49(H) 44(H)  Creatinine 0.61 - 1.24 mg/dL 4.11(H) 4.11(H) 3.60(H)  Sodium 135 - 145 mmol/L 149(H) 151(H) 153(H)  Potassium 3.5 - 5.1 mmol/L 3.8 3.7 3.8  Chloride 98 - 111 mmol/L 117(H) 117(H) 120(H)  CO2 22 - 32 mmol/L 22 21(L) 21(L)  Calcium 8.9 - 10.3 mg/dL 8.5(L) 8.3(L) 8.4(L)     Assessment/Plan:   # Acute renal failure; non-oliguric  - Multifactorial with insults of ATN, urinary retention, mild rhabdo, and IV acyclovir - Continue hydration with IV fluids - Agree with switching off of  vancomycin - Would transition off of IV acyclovir when able per ID -Strict ins and outs - Continue supportive care  - Continue foley catheter  - No acute indication for dialysis.  Note palliative consulted and invasive measures do not seem consistent with goals.  Goals of care discussions are ongoing with his family  # HSV encephalitis -Currently on IV acyclovir per ID  # Hypernatremia - Continue D5W - Slowly improving  - Free water deficit   # Urinary retention  - Continue foley catheter   # Bacteremia  -Felt to be transient bacteremia versus skin contaminant per ID - On Linezolid per ID  # Dysphagia  -  neurology has evaluated  - history of myelopathy and cervical dystonia  Claudia Desanctis, MD 02/15/2019 11:13 AM

## 2019-02-16 ENCOUNTER — Inpatient Hospital Stay (HOSPITAL_COMMUNITY): Payer: Medicare Other

## 2019-02-16 DIAGNOSIS — Z66 Do not resuscitate: Secondary | ICD-10-CM

## 2019-02-16 DIAGNOSIS — M6282 Rhabdomyolysis: Secondary | ICD-10-CM

## 2019-02-16 DIAGNOSIS — R1312 Dysphagia, oropharyngeal phase: Secondary | ICD-10-CM

## 2019-02-16 LAB — CULTURE, BLOOD (ROUTINE X 2)
Culture: NO GROWTH
Culture: NO GROWTH
Special Requests: ADEQUATE
Special Requests: ADEQUATE

## 2019-02-16 LAB — GLUCOSE, CAPILLARY
Glucose-Capillary: 108 mg/dL — ABNORMAL HIGH (ref 70–99)
Glucose-Capillary: 116 mg/dL — ABNORMAL HIGH (ref 70–99)
Glucose-Capillary: 131 mg/dL — ABNORMAL HIGH (ref 70–99)
Glucose-Capillary: 137 mg/dL — ABNORMAL HIGH (ref 70–99)
Glucose-Capillary: 138 mg/dL — ABNORMAL HIGH (ref 70–99)
Glucose-Capillary: 139 mg/dL — ABNORMAL HIGH (ref 70–99)

## 2019-02-16 LAB — BASIC METABOLIC PANEL
Anion gap: 13 (ref 5–15)
BUN: 50 mg/dL — ABNORMAL HIGH (ref 6–20)
CO2: 20 mmol/L — ABNORMAL LOW (ref 22–32)
Calcium: 8.4 mg/dL — ABNORMAL LOW (ref 8.9–10.3)
Chloride: 109 mmol/L (ref 98–111)
Creatinine, Ser: 3.42 mg/dL — ABNORMAL HIGH (ref 0.61–1.24)
GFR calc Af Amer: 21 mL/min — ABNORMAL LOW (ref >60)
GFR calc non Af Amer: 18 mL/min — ABNORMAL LOW (ref >60)
Glucose, Bld: 140 mg/dL — ABNORMAL HIGH (ref 70–99)
Potassium: 3.9 mmol/L (ref 3.5–5.1)
Sodium: 142 mmol/L (ref 135–145)

## 2019-02-16 LAB — CBC WITH DIFFERENTIAL/PLATELET
Abs Immature Granulocytes: 0.1 10*3/uL — ABNORMAL HIGH (ref 0.00–0.07)
Basophils Absolute: 0 10*3/uL (ref 0.0–0.1)
Basophils Relative: 0 %
Eosinophils Absolute: 0.1 10*3/uL (ref 0.0–0.5)
Eosinophils Relative: 1 %
HCT: 21.8 % — ABNORMAL LOW (ref 39.0–52.0)
Hemoglobin: 7.3 g/dL — ABNORMAL LOW (ref 13.0–17.0)
Immature Granulocytes: 1 %
Lymphocytes Relative: 7 %
Lymphs Abs: 1.1 10*3/uL (ref 0.7–4.0)
MCH: 32.2 pg (ref 26.0–34.0)
MCHC: 33.5 g/dL (ref 30.0–36.0)
MCV: 96 fL (ref 80.0–100.0)
Monocytes Absolute: 0.7 10*3/uL (ref 0.1–1.0)
Monocytes Relative: 5 %
Neutro Abs: 12.9 10*3/uL — ABNORMAL HIGH (ref 1.7–7.7)
Neutrophils Relative %: 86 %
Platelets: 280 10*3/uL (ref 150–400)
RBC: 2.27 MIL/uL — ABNORMAL LOW (ref 4.22–5.81)
RDW: 15 % (ref 11.5–15.5)
WBC: 14.9 10*3/uL — ABNORMAL HIGH (ref 4.0–10.5)
nRBC: 0 % (ref 0.0–0.2)

## 2019-02-16 MED ORDER — AMLODIPINE BESYLATE 10 MG PO TABS
10.0000 mg | ORAL_TABLET | Freq: Every day | ORAL | Status: DC
  Administered 2019-02-16 – 2019-03-04 (×17): 10 mg via ORAL
  Filled 2019-02-16 (×18): qty 1

## 2019-02-16 MED ORDER — SODIUM CHLORIDE 0.45 % IV SOLN
INTRAVENOUS | Status: DC
  Administered 2019-02-16 – 2019-02-17 (×4): via INTRAVENOUS

## 2019-02-16 NOTE — Progress Notes (Signed)
Pharmacy Antibiotic Note  Patrick Daniel is a 63 y.o. 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  Pharmacy has been consulted for acyclovir IV dosing.  Patient with AKI (likely multifactorial due to acyclovir IV therapy, vancomycin, dehydration and rhabdo on admit). Scr recovering today 4.11>>3.42   Plan: Continue acyclovir 650 mg IV q24h Monitor renal function and LOT.   Height: 5\' 6"  (167.6 cm) Weight: 148 lb 2.4 oz (67.2 kg) IBW/kg (Calculated) : 63.8  Temp (24hrs), Avg:98 F (36.7 C), Min:97.6 F (36.4 C), Max:98.6 F (37 C)  Recent Labs  Lab 02/09/19 1529 02/09/19 1714  02/10/19 1048 02/10/19 1354  02/11/19 1350  02/12/19 1600  02/13/19 0657  02/13/19 2016 02/14/19 0508 02/14/19 1931 02/15/19 0324 02/16/19 0438  WBC 12.4*  --    < >  --   --    < > 12.8*  --   --   --  13.8*  --   --  13.3*  --  12.9* 14.9*  CREATININE 1.38*  --    < >  --   --    < >  --    < >  --    < >  --    < > 2.84* 3.60* 4.11* 4.11* 3.42*  LATICACIDVEN 3.4* 2.0*  --  1.4 1.3  --   --   --   --   --   --   --   --   --   --   --   --   VANCOTROUGH  --   --   --   --   --   --   --   --  <4*  --   --   --   --   --   --   --   --    < > = values in this interval not displayed.    Estimated Creatinine Clearance: 20.7 mL/min (A) (by C-G formula based on SCr of 3.42 mg/dL (H)).    Allergies  Allergen Reactions  . Baclofen Other (See Comments)    Weakness, drowsiness   Thank you for involving pharmacy in this patient's care.  Janae Bridgeman, PharmD PGY1 Pharmacy Resident Phone: [phone] 02/16/2019 8:26 AM

## 2019-02-16 NOTE — Progress Notes (Signed)
PT Cancellation Note  Patient Details Name: Patrick Daniel MRN: [id number] DOB: Jun 17, 1956   Cancelled Treatment:    Reason Eval/Treat Not Completed: Patient at procedure or test/unavailable. Pt currently off unit in radiology. Will continue to follow and progress as able per POC.   Rolinda Roan, PT, DPT Acute Rehabilitation Services Pager: [phone] Office: [place] 02/16/2019, 1:37 PM

## 2019-02-16 NOTE — Progress Notes (Signed)
Patient ID: Patrick Daniel, male   DOB: Jun 27, 1956, 63 y.o.   MRN: [id number]  This NP visited patient at the bedside as a follow up for palliative medicine needs and emotional support.  Patient remains with garbled speech and unable to follow commands.  It is difficult to assess his psychiatric and cognitive status not knowing his baseline.  Discussed with nursing and he is tolerating meals at approximately 30%, he remains high risk for aspiration.  Family is aware of aspiration risk.  Please call to update his sister and offer emotional support.  Questions and concerns addressed.  Discussed with family the importance of continued conversation with each other and the  medical providers regarding overall plan of care and treatment options,  ensuring decisions are within the context of the patients values and GOCs.  Questions and concerns addressed   Discussed with Family Medicine   Total time spent on the unit was 15 minutes  Greater than 50% of the time was spent in counseling and coordination of care  Wadie Lessen NP  Palliative Medicine Team Team Phone # [phone] Pager [phone]

## 2019-02-16 NOTE — Progress Notes (Signed)
[facility] Progress Note  Name: Patrick Daniel MRN: [id number] DOB: 11-15-1955  Chief Complaint:  Found down   Subjective:  Patient with 1.7 liters UOP over 4/21.  He has been on D5.  More alert today. Spoke with nursing.    Review of systems:  Limited with dysarthria and some confusion but denies shortness of breath  ------------- Background on consult:  Patrick Daniel is a 63 y.o. male with a past medical history of cervical dystonia who presented to the hospital after being found down after a fall; he was also confused.  Individuals at his independent living facility had noted that his packages were stacking up and alerted staff who located the patient.  He has developed AKI over the course of his hospitalization and nephrology is consulted for assistance with management of the same.  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  He has had issues with dysphasia and note that per infectious disease he is possibly candidate to complete the remainder of his 3-week treatment with p.o. Valtrex once he is able to swallow pills.  He was on vancomycin for coag negative staph bacteremia and ID reported plans to switch to linezolid in the setting of his AKI.  CT earlier this admission is negative for hydro.  He has also developed hypernatremia and per charting has pulled out multiple feeding tubes.  He had his Foley catheter placed earlier today and reportedly had also pulled one out previously.  Per the nurse, this was a difficult Foley placement and he had 950 mL in his bladder prior to Foley.  Per nursing his sister is his contact Georgia Duff at [phone].    Intake/Output Summary (Last 24 hours) at 02/16/2019 0859 Last data filed at 02/16/2019 0320 Gross per 24 hour  Intake 964.96 ml  Output 1675 ml  Net -710.04 ml    Vitals:  Vitals:   02/15/19 2022 02/16/19 0030 02/16/19 0318 02/16/19 0825  BP: (!) 152/85 124/78 (!) 148/102 (!) 126/56  Pulse: (!)  115 (!) 113 (!) 120 (!) 110  Resp: 17 17 16 17   Temp: 97.6 F (36.4 C) 97.9 F (36.6 C) 98.2 F (36.8 C) 97.6 F (36.4 C)  TempSrc: Oral Oral Oral Oral  SpO2: 98% 99% 98% 98%  Weight:      Height:         Physical Exam:  General elderly male in bed in no acute distress. Chronically ill appearing  Lungs clear to auscultation bilaterally normal work of breathing at rest  Heart tachycardia; no rub Abdomen soft nontender nondistended Extremities no edema; mittens Psych calm on exam  Neuro dysarthria.  Follows commands     Medications reviewed   Labs:  BMP Latest Ref Rng & Units 02/16/2019 02/15/2019 02/14/2019  Glucose 70 - 99 mg/dL 140(H) 158(H) 120(H)  BUN 6 - 20 mg/dL 50(H) 49(H) 49(H)  Creatinine 0.61 - 1.24 mg/dL 3.42(H) 4.11(H) 4.11(H)  Sodium 135 - 145 mmol/L 142 149(H) 151(H)  Potassium 3.5 - 5.1 mmol/L 3.9 3.8 3.7  Chloride 98 - 111 mmol/L 109 117(H) 117(H)  CO2 22 - 32 mmol/L 20(L) 22 21(L)  Calcium 8.9 - 10.3 mg/dL 8.4(L) 8.5(L) 8.3(L)     Assessment/Plan:   # Acute renal failure; non-oliguric and improving - Multifactorial with insults of ATN, urinary retention, mild rhabdo, and IV acyclovir - Continue hydration with IV fluids - Would transition off of IV acyclovir when able per ID discretion  -Strict ins and  outs - Continue supportive care  - Continue foley catheter  - No acute indication for dialysis.  Note palliative consulted and invasive measures do not seem consistent with goals.  Goals of care discussions are ongoing with his family  # HSV encephalitis  -Currently on IV acyclovir per ID  # Hypernatremia  - Resolving free water deficit  - Transition to 1/2 Normal saline    # Urinary retention  - Continue foley catheter   # Bacteremia -Felt to be transient bacteremia versus skin contaminant per ID - On Linezolid per ID  # Dysphagia  - neurology has evaluated  - history of myelopathy and cervical dystonia  Claudia Desanctis, MD 02/16/2019  8:59 AM

## 2019-02-16 NOTE — Progress Notes (Signed)
Modified Barium Swallow Progress Note  Patient Details  Name: Patrick Daniel MRN: [id number] Date of Birth: 08-17-1956  Today's Date: 02/16/2019  Modified Barium Swallow completed.  Full report located under Chart Review in the Imaging Section.  Brief recommendations include the following:  Clinical Impression  Patient presents with a severe oral and mod-severe pharyngeal dysphagia which is sensory-motor based and likely acute exacerbation of chronic issues. As compared to MBS completed on 4/19, he is demonstrating improved ability to pharyngeally transit boluses, however he continues to have significant difficulty and inability to effectively transit heavier boluses of honey thick liquids and puree solids and residuals remain in vallecular sinus, posterior pharyngeal wall and some in pyriform sinus, resulting in aspiration when residuals mixed with nectar thick liquids. Oral phase was delayed with patient exhibiting lingual pumping, discoordination of swallow with poor control of bolus and delays in posterior transit of bolus. These delays also occured when patient drank thin liquids through straw, as he took sip, held in oral cavity and manipulated before a swallow was initiated.  Patient exhibited swallow initiation delays to pyriform sinus with thin liquids and aspiration of minimal amount during the swallow. With nectar thick liquids, patient did exhibit one instance of silent aspiraiton, however it appeared to be nectar thick liquids mixing with residuals in pharynx. Again, amount of aspirate was minimal. Patient with significant reduction in cricopharyngeal relaxtation, leading to poor UES opening and build up of heavier boluses in pharynx, but with eventual transit.  Patient continues to be at a very high risk of aspiration and safest PO recommendation at this time is nectar thick liquids only, but with known risk of aspiration.   Swallow Evaluation Recommendations       SLP Diet  Recommendations: Nectar thick liquid   Liquid Administration via: Cup;Spoon   Medication Administration: Other (Comment)(liquid form or alternative means)   Supervision: Full assist for feeding;Staff to assist with self feeding   Compensations: Minimize environmental distractions;Slow rate;Small sips/bites   Postural Changes: Seated upright at 90 degrees;Remain semi-upright after after feeds/meals (Comment)   Oral Care Recommendations: Oral care QID   Other Recommendations: Order thickener from pharmacy;Prohibited food (jello, ice cream, thin soups);Have oral suction available;Clarify dietary restrictions    [name] 02/16/2019,2:39 PM    Sonia Baller, MA, CCC-SLP Speech Therapy [facility] Acute Rehab Pager: [phone]

## 2019-02-16 NOTE — Progress Notes (Signed)
Patrick Daniel for Infectious Disease  Date of Admission:  02/09/2019     Total days of antibiotics 8 Acyclovir, day 8           Patient ID: Patrick Daniel is a 63 y.o. 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 amLODipine  10 mg Oral Daily  . chlorhexidine  15 mL Mouth Rinse BID  . mouth rinse  15 mL Mouth Rinse q12n4p  . tamsulosin  0.4 mg Oral Daily    SUBJECTIVE: Reports he is "doing pretty good" today. He otherwise states no complaints. Overnight with improvement    Review of Systems: Review of Systems  Unable to perform ROS: Mental status change    Allergies  Allergen Reactions  . Baclofen Other (See Comments)    Weakness, drowsiness    OBJECTIVE: Vitals:   02/16/19 0030 02/16/19 0318 02/16/19 0825 02/16/19 1629  BP: 124/78 (!) 148/102 (!) 126/56 (!) 146/91  Pulse: (!) 113 (!) 120 (!) 110 (!) 116  Resp: 17 16 17 17   Temp: 97.9 F (36.6 C) 98.2 F (36.8 C) 97.6 F (36.4 C) 98.5 F (36.9 C)  TempSrc: Oral Oral Oral Oral  SpO2: 99% 98% 98% 93%  Weight:      Height:       Body mass index is 23.91 kg/m.  Physical Exam Constitutional:      Comments: Sitting up in bed watching TV - he is very alert.   HENT:     Mouth/Throat:     Mouth: Mucous membranes are dry.     Comments: Dry mouth  Eyes:     General: No scleral icterus.    Pupils: Pupils are equal, round, and reactive to light.  Cardiovascular:     Rate and Rhythm: Tachycardia present.     Pulses: Normal pulses.     Heart sounds: No murmur.  Pulmonary:     Effort: Pulmonary effort is normal.     Breath sounds: Normal breath sounds.   Abdominal:     General: There is no distension.     Palpations: Abdomen is soft.  Skin:    General: Skin is warm and dry.     Capillary Refill: Capillary refill takes less than 2 seconds.     Comments: Multiple scabbed abrasions on knees. All healing w/o signs of infection.   Neurological:     Comments: Neurologically he has improved significantly.     Lab Results Lab Results  Component Value Date   WBC 14.9 (H) 02/16/2019   HGB 7.3 (L) 02/16/2019   HCT 21.8 (L) 02/16/2019   MCV 96.0 02/16/2019   PLT 280 02/16/2019    Lab Results  Component Value Date   CREATININE 3.42 (H) 02/16/2019   BUN 50 (H) 02/16/2019   NA 142 02/16/2019   K 3.9 02/16/2019   CL 109 02/16/2019   CO2 20 (L) 02/16/2019    Lab Results  Component Value Date   ALT 39 02/11/2019   AST 51 (H) 02/11/2019   ALKPHOS 55 02/11/2019   BILITOT 1.3 (H) 02/11/2019  Microbiology: Recent Results (from the past 240 hour(s))  Blood culture (routine x 2)     Status: Abnormal   Collection Time: 02/09/19  5:08 PM  Result Value Ref Range Status   Specimen Description BLOOD RIGHT ANTECUBITAL  Final   Special Requests   Final    BOTTLES DRAWN AEROBIC AND ANAEROBIC Blood Culture results may not be optimal due to an inadequate volume of blood received in culture bottles   Culture  Setup Time   Final    GRAM POSITIVE COCCI IN CLUSTERS IN BOTH AEROBIC AND ANAEROBIC BOTTLES CRITICAL RESULT CALLED TO, READ BACK BY AND VERIFIED WITH: PHARMD ERIN DEJA AT 6301 ON 02/10/2019 BY MHOUEGNIFIO    Culture (A)  Final    STAPHYLOCOCCUS SPECIES (COAGULASE NEGATIVE) THE SIGNIFICANCE OF ISOLATING THIS ORGANISM FROM A SINGLE SET OF BLOOD CULTURES WHEN MULTIPLE SETS ARE DRAWN IS UNCERTAIN. PLEASE NOTIFY THE MICROBIOLOGY DEPARTMENT WITHIN ONE WEEK IF SPECIATION AND SENSITIVITIES ARE REQUIRED. Performed at [facility], [place] [address], [place], [place] [address]    Report Status 02/12/2019 FINAL  Final  Blood Culture ID Panel  (Reflexed)     Status: Abnormal   Collection Time: 02/09/19  5:08 PM  Result Value Ref Range Status   Enterococcus species NOT DETECTED NOT DETECTED Final   Listeria monocytogenes NOT DETECTED NOT DETECTED Final   Staphylococcus species DETECTED (A) NOT DETECTED Final    Comment: Methicillin (oxacillin) resistant coagulase negative staphylococcus. Possible blood culture contaminant (unless isolated from more than one blood culture draw or clinical case suggests pathogenicity). No antibiotic treatment is indicated for blood  culture contaminants. CRITICAL RESULT CALLED TO, READ BACK BY AND VERIFIED WITH: PHARMD ERIN DEJA AT 1332 ON 02/10/2019 BY MHOUEGNIFIO    Staphylococcus aureus (BCID) NOT DETECTED NOT DETECTED Final   Methicillin resistance DETECTED (A) NOT DETECTED Final    Comment: CRITICAL RESULT CALLED TO, READ BACK BY AND VERIFIED WITH: PHARMD ERIN DEJA AT 1332 ON 02/10/2019 BY MHOUEGNIFIO    Streptococcus species NOT DETECTED NOT DETECTED Final   Streptococcus agalactiae NOT DETECTED NOT DETECTED Final   Streptococcus pneumoniae NOT DETECTED NOT DETECTED Final   Streptococcus pyogenes NOT DETECTED NOT DETECTED Final   Acinetobacter baumannii NOT DETECTED NOT DETECTED Final   Enterobacteriaceae species NOT DETECTED NOT DETECTED Final   Enterobacter cloacae complex NOT DETECTED NOT DETECTED Final   Escherichia coli NOT DETECTED NOT DETECTED Final   Klebsiella oxytoca NOT DETECTED NOT DETECTED Final   Klebsiella pneumoniae NOT DETECTED NOT DETECTED Final   Proteus species NOT DETECTED NOT DETECTED Final   Serratia marcescens NOT DETECTED NOT DETECTED Final   Haemophilus influenzae NOT DETECTED NOT DETECTED Final   Neisseria meningitidis NOT DETECTED NOT DETECTED Final   Pseudomonas aeruginosa NOT DETECTED NOT DETECTED Final   Candida albicans NOT DETECTED NOT DETECTED Final   Candida glabrata NOT DETECTED NOT DETECTED Final   Candida krusei NOT DETECTED NOT DETECTED Final    Candida parapsilosis NOT DETECTED NOT DETECTED Final   Candida tropicalis NOT DETECTED NOT DETECTED Final    Comment: Performed at [facility], [place] [address], [place], [place] [address]  Blood culture (routine x 2)     Status: None   Collection Time: 02/09/19  5:15 PM  Result Value Ref Range Status   Specimen Description BLOOD LEFT ANTECUBITAL  Final   Special Requests   Final    BOTTLES DRAWN AEROBIC AND ANAEROBIC Blood Culture adequate volume   Culture  Final    NO GROWTH 5 DAYS Performed at [facility], [place] [address] [place], [place] [address]    Report Status 02/14/2019 FINAL  Final  CSF culture     Status: None   Collection Time: 02/09/19 10:45 PM  Result Value Ref Range Status   Specimen Description CSF  Final   Special Requests Normal  Final   Gram Stain   Final    WBC PRESENT, PREDOMINANTLY MONONUCLEAR NO ORGANISMS SEEN CYTOSPIN SMEAR    Culture   Final    NO GROWTH Performed at [facility], [place] [address] [place], [place] [address]    Report Status 02/13/2019 FINAL  Final  Culture, fungus without smear     Status: None (Preliminary result)   Collection Time: 02/09/19 10:45 PM  Result Value Ref Range Status   Specimen Description CSF  Final   Special Requests Normal  Final   Culture   Final    NO FUNGUS ISOLATED AFTER 7 DAYS Performed at [facility], [place] [address], [place], [place] [address]    Report Status PENDING  Incomplete  Anaerobic culture     Status: None   Collection Time: 02/09/19 10:45 PM  Result Value Ref Range Status   Specimen Description CSF  Final   Special Requests Normal  Final   Culture   Final    NO ANAEROBES ISOLATED Performed at [facility], [place] [address], [place], [place] [address]    Report Status 02/14/2019 FINAL  Final  Culture, blood (routine x 2)     Status: None   Collection Time: 02/11/19  9:01 AM  Result Value Ref Range Status   Specimen Description BLOOD RIGHT ANTECUBITAL  Final    Special Requests   Final    BOTTLES DRAWN AEROBIC ONLY Blood Culture adequate volume   Culture   Final    NO GROWTH 5 DAYS Performed at [facility], [place] [address], [place], [place] [address]    Report Status 02/16/2019 FINAL  Final  Culture, blood (routine x 2)     Status: None   Collection Time: 02/11/19  9:01 AM  Result Value Ref Range Status   Specimen Description BLOOD RIGHT ARM  Final   Special Requests   Final    BOTTLES DRAWN AEROBIC ONLY Blood Culture adequate volume   Culture   Final    NO GROWTH 5 DAYS Performed at [facility], [place] [address] [place], [place] [address]    Report Status 02/16/2019 FINAL  Final  Culture, Urine     Status: None   Collection Time: 02/14/19  2:47 PM  Result Value Ref Range Status   Specimen Description URINE, CATHETERIZED  Final   Special Requests NONE  Final   Culture   Final    NO GROWTH Performed at [facility], [place] [address] [place], [place] [address]    Report Status 02/15/2019 FINAL  Final     ASSESSMENT & PLAN:  1. 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 He had been recovering nicely with IV acyclovir however has had trouble with nephropathy associated with this therapy in the setting of poor PO intake. Barring he does OK with POs today may try to crush valtrex in AM and give in pudding/applesauce to continue treatment.   2. Methicillin Resistant CoNS Bacteremia = 1/2 blood cultures positive for MR-CoNS likely arising from  open wounds on skin from his prolonged time he was on the floor PTA. Given his multiple open lesions may be transient bacteremia vs skin contaminant from draw. Completed 7d anti-staphylococcal coverage.   3. Medication Monitoring = Continue to check BMP daily while on IV acyclovir given risk for crystal-induced nephropathy with medication.   4. Rhabdo/AKI = creatinine improved today. Nephrology following.    Janene Madeira, MSN, NP-C [place] for Infectious Disease [email or web address] Pager: [phone] Office: [facility]: [phone]   02/16/2019  5:33 PM

## 2019-02-16 NOTE — Progress Notes (Signed)
OT Cancellation Note  Patient Details Name: Patrick Daniel MRN: [id number] DOB: [birth year]   Cancelled Treatment:    Reason Eval/Treat Not Completed: Patient at procedure or test/ unavailable. Pt currently off unit and unavailable for therapy. Will f/u for therapy treatment at another time.   Darrol Jump OTR/L Acute Rehabilitation Services 02/16/2019, 1:40 PM

## 2019-02-16 NOTE — Progress Notes (Addendum)
Family Medicine Teaching Service Daily Progress Note Intern Pager: [phone]  Patient name: Patrick Daniel Medical record number: [id number] Date of birth: 1955/12/25 Age: 63 y.o. Gender: male  Primary Care Provider: Marco Collie, MD Consultants: neuro, ID, orthopedics (s/o) Code Status: FULL  Pt Overview and Major Events to Date:  4/15 admitted having been found down, AMS, neuro consulted, LP suggestive of HSV encephalitis, acyclovir started, 1/2 Blood cx positive for Coag neg Staph, CSF cx NGTD, CSF fungal NGTD, Anaerobic cx NGTD 4/16 Cefepime discontinued  4/17 Repeat blood cultures  4/18 Vanc Discontinued, ID added linezolid 4/20 acute renal failure, nephro consulted, pt unable to keep NG down  Antimicrobials: Vancomycin 4/15-4/18 Cefepime 4/15-4/16 Acyclovir 4/15- Linezolid 4/18-4/21  Assessment and Plan: Patrick Daniel is a 63 y.o. male who presented with AMS after being found down. PMH is significant for cervical dystonia, dysarthria, HLD, gait abnormality, h/o myelopathy.  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 Patient continues to show slow cognitive improvement. Per ID, patient is expected to make a clinically meaningful recovery although that process may be quite prolonged. Although last SLP evaluation  Had deemed him a severe aspiration risk, patient was unable to tolerate NG tube and had self discontinued. After discussion with palliative, family had opted to continue feeding by mouth. -Discussion with Dr. Prince Rome regarding transition to po acyclovir given family preference to feed patient, recommended SLP revaluation today -SLP recommending nectar thick liquids and advised that since patient is pocketing that would be difficult to ensure that he would be getting full dose of medications.  - continue IV acyclovir x 21 days (4/16-), hopeful for transition to PO when dysphagia improves - PT/OT recommending SNF  - Tylenol PRN for fever/pain - AM CBC,  BMP  Methicillin Resistant CoNS Bacteremia, resolved. Likely contaminant vs from open skin wounds. Repeat BCx neg. - s/p 7d tx with Vancomycin (4/15-4/18) and Linezolid (4/18-4/21)  AKI 2/2 ATN and urinary retention. Cr 0.87>>peak 4.1> now improved to 3.42 this am. Likely multifactorial given NPO, vanc, acyclovir, urinary retention. Now has foley in and UOP 1200cc overnight.  - nephro transitioned patient from D5W to half normal saline IVF today - will decrease 1/2NS rate to 100cc/hr today - monitor Na and Cr on am BMP - continue flomax - will need voiding trial likely in a few days   Hypernatremia, resolved. Improved from peak 157 now to 142 today - fluids as per above  Anemia, stable.: Hemoglobin  7.3. Iron studies suggestive of anemia of chronic disease, retics likely elevated in the setting of acute illness.   -Daily CBC  Constipation:  -dulcolax prn  HTN - change to norvasc 10mg  qd and DC IV hydralazie   L humeral neck fracture - discussed with ortho today given patient's clinical improvement, can reconsult inpatient if patient becomes conversational but otherwise f/u outpt - PRN tylenol, monitor for pain  FEN/GI: dysphagia 1 with nectar thick liquids  Prophylaxis: SCDs, no pharm DVT ppx d/t blood in ventricles on MRI brain   Disposition: pending medical management  Subjective:  Patient speaking in unintelligible sentences but attempting to have conversation with appropriate pauses after attempting to ask questions.   Objective: Temp:  [97.6 F (36.4 C)-98.2 F (36.8 C)] 97.6 F (36.4 C) (04/22 0825) Pulse Rate:  [110-120] 110 (04/22 0825) Resp:  [16-17] 17 (04/22 0825) BP: (124-152)/(56-102) 126/56 (04/22 0825) SpO2:  [98 %-99 %] 98 % (04/22 0825)   Physical Exam:  General: Laying in bed comfortably with mitts  on, in NAD Cardiovascular:  tachycardic, regular rhythm.  Normal S1 and S2. No murmurs Respiratory: Normal work of breathing on room air.  Clear to  auscultation bilaterally. Abdomen: Soft, nontender, nondistended, + bowel sounds  Extremities:  Bilateral lower extremities without swelling or erythema. Neuro: awake and alert. Speech dysarthric   Laboratory: Recent Labs  Lab 02/14/19 0508 02/15/19 0324 02/16/19 0438  WBC 13.3* 12.9* 14.9*  HGB 7.5* 7.4* 7.3*  HCT 23.8* 23.0* 21.8*  PLT 338 307 280   Recent Labs  Lab 02/09/19 1529  02/11/19 0342  02/14/19 1931 02/15/19 0324 02/16/19 0438  NA 138   < > 149*   < > 151* 149* 142  K 4.6   < > 3.4*   < > 3.7 3.8 3.9  CL 97*   < > 117*   < > 117* 117* 109  CO2 21*   < > 21*   < > 21* 22 20*  BUN 81*   < > 26*   < > 49* 49* 50*  CREATININE 1.38*   < > 0.94   < > 4.11* 4.11* 3.42*  CALCIUM 9.4   < > 8.2*   < > 8.3* 8.5* 8.4*  PROT 8.1  --  6.0*  --   --   --   --   BILITOT 1.9*  --  1.3*  --   --   --   --   ALKPHOS 68  --  55  --   --   --   --   ALT 46*  --  39  --   --   --   --   AST 54*  --  51*  --   --   --   --   GLUCOSE 143*   < > 108*   < > 120* 158* 140*   < > = values in this interval not displayed.   Iron/TIBC/Ferritin/ %Sat    Component Value Date/Time   IRON 51 02/11/2019 0342   TIBC 230 (L) 02/11/2019 0342   FERRITIN 327 02/11/2019 1350   IRONPCTSAT 22 02/11/2019 0342    Imaging/Diagnostic Tests: No results found.  Bufford Lope, DO 02/16/2019, 1:48 PM PGY-3, [place] Intern pager: [phone], text pages welcome

## 2019-02-17 DIAGNOSIS — G0490 Encephalitis and encephalomyelitis, unspecified: Secondary | ICD-10-CM

## 2019-02-17 LAB — CBC WITH DIFFERENTIAL/PLATELET
Abs Immature Granulocytes: 0.09 10*3/uL — ABNORMAL HIGH (ref 0.00–0.07)
Basophils Absolute: 0 10*3/uL (ref 0.0–0.1)
Basophils Relative: 0 %
Eosinophils Absolute: 0.1 10*3/uL (ref 0.0–0.5)
Eosinophils Relative: 0 %
HCT: 18.1 % — ABNORMAL LOW (ref 39.0–52.0)
Hemoglobin: 6 g/dL — CL (ref 13.0–17.0)
Immature Granulocytes: 1 %
Lymphocytes Relative: 9 %
Lymphs Abs: 1.2 10*3/uL (ref 0.7–4.0)
MCH: 31.4 pg (ref 26.0–34.0)
MCHC: 33.1 g/dL (ref 30.0–36.0)
MCV: 94.8 fL (ref 80.0–100.0)
Monocytes Absolute: 0.7 10*3/uL (ref 0.1–1.0)
Monocytes Relative: 5 %
Neutro Abs: 11.9 10*3/uL — ABNORMAL HIGH (ref 1.7–7.7)
Neutrophils Relative %: 85 %
Platelets: 256 10*3/uL (ref 150–400)
RBC: 1.91 MIL/uL — ABNORMAL LOW (ref 4.22–5.81)
RDW: 14.6 % (ref 11.5–15.5)
WBC: 13.9 10*3/uL — ABNORMAL HIGH (ref 4.0–10.5)
nRBC: 0 % (ref 0.0–0.2)

## 2019-02-17 LAB — BASIC METABOLIC PANEL
Anion gap: 12 (ref 5–15)
BUN: 55 mg/dL — ABNORMAL HIGH (ref 6–20)
CO2: 19 mmol/L — ABNORMAL LOW (ref 22–32)
Calcium: 8.2 mg/dL — ABNORMAL LOW (ref 8.9–10.3)
Chloride: 113 mmol/L — ABNORMAL HIGH (ref 98–111)
Creatinine, Ser: 2.01 mg/dL — ABNORMAL HIGH (ref 0.61–1.24)
GFR calc Af Amer: 41 mL/min — ABNORMAL LOW (ref >60)
GFR calc non Af Amer: 35 mL/min — ABNORMAL LOW (ref >60)
Glucose, Bld: 106 mg/dL — ABNORMAL HIGH (ref 70–99)
Potassium: 3.9 mmol/L (ref 3.5–5.1)
Sodium: 144 mmol/L (ref 135–145)

## 2019-02-17 LAB — GLUCOSE, CAPILLARY
Glucose-Capillary: 103 mg/dL — ABNORMAL HIGH (ref 70–99)
Glucose-Capillary: 105 mg/dL — ABNORMAL HIGH (ref 70–99)
Glucose-Capillary: 130 mg/dL — ABNORMAL HIGH (ref 70–99)
Glucose-Capillary: 107 mg/dL — ABNORMAL HIGH (ref 70–99)

## 2019-02-17 LAB — HEMOGLOBIN AND HEMATOCRIT, BLOOD
HCT: 21.9 % — ABNORMAL LOW (ref 39.0–52.0)
Hemoglobin: 7.7 g/dL — ABNORMAL LOW (ref 13.0–17.0)

## 2019-02-17 LAB — ABO/RH: ABO/RH(D): AB NEG

## 2019-02-17 LAB — PREPARE RBC (CROSSMATCH)

## 2019-02-17 MED ORDER — SODIUM CHLORIDE 0.9% IV SOLUTION: Freq: Once | INTRAVENOUS | Status: DC

## 2019-02-17 MED ORDER — DEXTROSE 5 % IV SOLN
10.0000 mg/kg | Freq: Two times a day (BID) | INTRAVENOUS | Status: DC
  Administered 2019-02-17 – 2019-02-18 (×2): 650 mg via INTRAVENOUS
  Filled 2019-02-17 (×4): qty 13

## 2019-02-17 NOTE — Progress Notes (Signed)
Family Medicine Teaching Service Daily Progress Note Intern Pager: [phone]  Patient name: Patrick Daniel Medical record number: [id number] Date of birth: Aug 05, 1956 Age: 63 y.o. Gender: male  Primary Care Provider: Marco Collie, MD Consultants: neuro, ID, orthopedics (s/o) Code Status: FULL  Pt Overview and Major Events to Date:  4/15 admitted having been found down, AMS, neuro consulted, LP suggestive of HSV encephalitis, acyclovir started, 1/2 Blood cx positive for Coag neg Staph, CSF cx NGTD, CSF fungal NGTD, Anaerobic cx NGTD 4/16 Cefepime discontinued  4/17 Repeat blood cultures  4/18 Vanc Discontinued, ID added linezolid 4/20 acute renal failure, nephro consulted, pt unable to keep NG down  Antimicrobials: Vancomycin 4/15-4/18 Cefepime 4/15-4/16 Acyclovir 4/15- Linezolid 4/18-4/21  Assessment and Plan: Patrick Daniel is a 63 y.o. male who presented with AMS after being found down. PMH is significant for cervical dystonia, dysarthria, HLD, gait abnormality, h/o myelopathy.  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  -continue comfort feeds with nectar thick liquids. SLP recommended continued therapy at SNF - continue IV acyclovir x 21 days (4/16-), hopeful for transition to PO when dysphagia improves - PT/OT recommending SNF  - Tylenol PRN for fever/pain - AM CBC, BMP  Methicillin Resistant CoNS Bacteremia, resolved. Likely contaminant vs from open skin wounds. Repeat BCx neg. - s/p 7d tx with Vancomycin (4/15-4/18) and Linezolid (4/18-4/21)  AKI 2/2 ATN and urinary retention. Cr 0.87>>peak 4.1> now improved to 2.01 this am. Would like to remove foley and do a void trial today. Consider to reinsert foley if post-void residual >300cc. - f/u nephro recs - continue 1/2 NS at 153mL/hr - monitor Na and Cr on am BMP - continue flomax - will need voiding trial likely in a few days   Hypernatremia, resolved. Improved from peak 157 now to 144 today -  fluids as per above - BMP am  Anemia, worsening-  Patient Hgb 6.0 this morning. Patient typed and screened with unit ready to transfuse. Called medical decision maker Arlis Porta (sister) for consent. Explained the indications, benefits, risks of transfusion and answered questions. Sister consented to procedure.  Iron studies suggestive of anemia of chronic disease, retics likely elevated in the setting of acute illness.   -Daily CBC  Constipation:  -dulcolax prn  HTN- improved 122/84 this am - continue amlodipine 10mg  daily   L humeral neck fracture- not current surgical candidate. Surgical consult recommended to reconsult if patient verbal and able to consent/request intervention, otherwise, f/u OP - PRN tylenol, monitor for pain  FEN/GI: dysphagia 1 with nectar thick liquids  Prophylaxis: SCDs, no pharm DVT ppx d/t blood in ventricles on MRI brain  Disposition: recommended SNF  Subjective:  Patient able to give positive or negative responses to yes and no questions. Not able to articulate well and I'm not able to understand his attempts at speech. Attempted to use visual tool to assess questions or concerns but was unsuccessful.   Objective: Temp:  [97.6 F (36.4 C)-98.8 F (37.1 C)] 98.8 F (37.1 C) (04/23 0740) Pulse Rate:  [110-116] 111 (04/23 0740) Resp:  [17-20] 17 (04/23 0740) BP: (122-173)/(56-91) 122/84 (04/23 0740) SpO2:  [93 %-98 %] 96 % (04/23 0740) Weight:  [64.2 kg] 64.2 kg (04/23 0305)   Physical Exam:  General: Laying in bed comfortably with mitts on, NAD Mouth: patient has poor dentition with severe xerostomia Cardiovascular: RRR, no murmur appreciated Respiratory: Normal work of breathing on room air.  Clear to auscultation bilaterally. Abdomen: Soft, nontender, nondistended, + bowel  sounds  Extremities:  Bilateral lower extremities without swelling or erythema. Neuro: awake and alert. Speech dysarthric   Laboratory: Recent Labs  Lab 02/15/19 0324  02/16/19 0438 02/17/19 0352  WBC 12.9* 14.9* 13.9*  HGB 7.4* 7.3* 6.0*  HCT 23.0* 21.8* 18.1*  PLT 307 280 256   Recent Labs  Lab 02/11/19 0342  02/15/19 0324 02/16/19 0438 02/17/19 0352  NA 149*   < > 149* 142 144  K 3.4*   < > 3.8 3.9 3.9  CL 117*   < > 117* 109 113*  CO2 21*   < > 22 20* 19*  BUN 26*   < > 49* 50* 55*  CREATININE 0.94   < > 4.11* 3.42* 2.01*  CALCIUM 8.2*   < > 8.5* 8.4* 8.2*  PROT 6.0*  --   --   --   --   BILITOT 1.3*  --   --   --   --   ALKPHOS 55  --   --   --   --   ALT 39  --   --   --   --   AST 51*  --   --   --   --   GLUCOSE 108*   < > 158* 140* 106*   < > = values in this interval not displayed.   Iron/TIBC/Ferritin/ %Sat    Component Value Date/Time   IRON 51 02/11/2019 0342   TIBC 230 (L) 02/11/2019 0342   FERRITIN 327 02/11/2019 1350   IRONPCTSAT 22 02/11/2019 0342    Imaging/Diagnostic Tests: Dg Swallowing Func-speech Pathology  Result Date: 02/16/2019 Objective Swallowing Evaluation: Type of Study: MBS-Modified Barium Swallow Study  Patient Details Name: [name] MRN: [id number] Date of Birth: 1956-07-23 Today's Date: 02/16/2019 Time: SLP Start Time (ACUTE ONLY): 6789 -SLP Stop Time (ACUTE ONLY): 1310 SLP Time Calculation (min) (ACUTE ONLY): 25 min Past Medical History: Past Medical History: Diagnosis Date . Dysarthria  . Dystonia  . Gait disturbance  . Myelopathy ([place])  . Paraplegia [facility])  Past Surgical History: Past Surgical History: Procedure Laterality Date . NECK SURGERY    dec 2014 HPI: Patrick Daniel is a 63 y.o. male presenting with history of paraplegia, myelopathy, dysarthria, cervical dystonia, (?CNS degenerative disorder )admitted from indpendendent living facility with AMS after being found down. 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 This distribution may also be seen in infection, particularly herpes encephalitis, though this seems less  likely in this presentation. Sustained  aleft humerus fx, rhabdomyolsis. 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  Subjective: cooperative, fidgeting and moving around in chair in radiology Assessment / Plan / Recommendation CHL IP CLINICAL IMPRESSIONS 02/16/2019 Clinical Impression Patient presents with a severe oral and mod-severe pharyngeal dysphagia which is sensory-motor based and likely acute exacerbation of chronic issues. As compared to MBS completed on 4/19, he is demonstrating improved ability to pharyngeally transit boluses, however he continues to have significant difficulty and inability to effectively transit heavier boluses of honey thick liquids and puree solids and residuals remain in vallecular sinus, posterior pharyngeal wall and some in pyriform sinus, resulting in aspiration when residuals mixed with nectar thick liquids. Oral phase was delayed with patient exhibiting lingual pumping, discoordination of swallow with poor control of bolus and delays in posterior transit of bolus. These delays also occured when patient drank thin liquids through straw, as he took sip,  held in oral cavity and manipulated before a swallow was initiated.  Patient exhibited swallow initiation delays to pyriform sinus with thin liquids and aspiration of minimal amount during the swallow. With nectar thick liquids, patient did exhibit one instance of silent aspiraiton, however it appeared to be nectar thick liquids mixing with residuals in pharynx. Again, amount of aspirate was minimal. Patient with significant reduction in cricopharyngeal relaxtation, leading to poor UES opening and build up of heavier boluses in pharynx, but with eventual transit.  Patient continues to be at a very high risk of aspiration and safest PO recommendation at this time is nectar thick liquids only, but with known risk of aspiration. SLP Visit Diagnosis Dysphagia, oropharyngeal phase (R13.12) Attention and  concentration deficit following -- Frontal lobe and executive function deficit following -- Impact on safety and function Severe aspiration risk;Risk for inadequate nutrition/hydration   CHL IP TREATMENT RECOMMENDATION 02/16/2019 Treatment Recommendations Therapy as outlined in treatment plan below   Prognosis 02/16/2019 Prognosis for Safe Diet Advancement Fair Barriers to Reach Goals Severity of deficits Barriers/Prognosis Comment -- CHL IP DIET RECOMMENDATION 02/16/2019 SLP Diet Recommendations Nectar thick liquid Liquid Administration via Cup;Spoon Medication Administration Other (Comment) Compensations Minimize environmental distractions;Slow rate;Small sips/bites Postural Changes Seated upright at 90 degrees;Remain semi-upright after after feeds/meals (Comment)   CHL IP OTHER RECOMMENDATIONS 02/16/2019 Recommended Consults -- Oral Care Recommendations Oral care QID Other Recommendations Order thickener from pharmacy;Prohibited food (jello, ice cream, thin soups);Have oral suction available;Clarify dietary restrictions   CHL IP FOLLOW UP RECOMMENDATIONS 02/16/2019 Follow up Recommendations Skilled Nursing facility   [facility] IP FREQUENCY AND DURATION 02/16/2019 Speech Therapy Frequency (ACUTE ONLY) min 2x/week Treatment Duration 2 weeks      CHL IP ORAL PHASE 02/16/2019 Oral Phase Impaired Oral - Pudding Teaspoon -- Oral - Pudding Cup -- Oral - Honey Teaspoon NT Oral - Honey Cup Lingual pumping;Weak lingual manipulation;Reduced posterior propulsion;Piecemeal swallowing;Delayed oral transit Oral - Nectar Teaspoon Weak lingual manipulation;Lingual pumping;Reduced posterior propulsion;Delayed oral transit Oral - Nectar Cup Weak lingual manipulation;Reduced posterior propulsion;Piecemeal swallowing;Delayed oral transit Oral - Nectar Straw -- Oral - Thin Teaspoon Weak lingual manipulation;Reduced posterior propulsion;Delayed oral transit Oral - Thin Cup Delayed oral transit;Lingual pumping;Weak lingual manipulation;Reduced  posterior propulsion Oral - Thin Straw Reduced posterior propulsion;Delayed oral transit Oral - Puree Weak lingual manipulation;Reduced posterior propulsion;Piecemeal swallowing;Delayed oral transit;Decreased bolus cohesion;Lingual pumping Oral - Mech Soft -- Oral - Regular -- Oral - Multi-Consistency -- Oral - Pill Weak lingual manipulation;Reduced posterior propulsion;Other (Comment) Oral Phase - Comment --  CHL IP PHARYNGEAL PHASE 02/16/2019 Pharyngeal Phase Impaired Pharyngeal- Pudding Teaspoon -- Pharyngeal -- Pharyngeal- Pudding Cup -- Pharyngeal -- Pharyngeal- Honey Teaspoon NT Pharyngeal -- Pharyngeal- Honey Cup Reduced pharyngeal peristalsis;Delayed swallow initiation-vallecula;Pharyngeal residue - valleculae;Pharyngeal residue - posterior pharnyx Pharyngeal Material does not enter airway Pharyngeal- Nectar Teaspoon Delayed swallow initiation-vallecula;Reduced airway/laryngeal closure;Penetration/Aspiration during swallow Pharyngeal Material enters airway, CONTACTS cords and not ejected out Pharyngeal- Nectar Cup Reduced laryngeal elevation;Delayed swallow initiation-vallecula;Penetration/Apiration after swallow Pharyngeal Material enters airway, passes BELOW cords without attempt by patient to eject out (silent aspiration);Other (Comment) Pharyngeal- Nectar Straw NT Pharyngeal -- Pharyngeal- Thin Teaspoon Delayed swallow initiation-pyriform sinuses;Reduced airway/laryngeal closure;Penetration/Aspiration during swallow;Trace aspiration Pharyngeal Material enters airway, passes BELOW cords without attempt by patient to eject out (silent aspiration) Pharyngeal- Thin Cup Delayed swallow initiation-pyriform sinuses;Reduced airway/laryngeal closure;Penetration/Aspiration during swallow Pharyngeal Material enters airway, passes BELOW cords without attempt by patient to eject out (silent aspiration) Pharyngeal- Thin Straw Delayed swallow initiation-pyriform sinuses;Penetration/Aspiration during swallow;Reduced  laryngeal elevation;Reduced  airway/laryngeal closure Pharyngeal Material enters airway, passes BELOW cords without attempt by patient to eject out (silent aspiration) Pharyngeal- Puree Delayed swallow initiation-vallecula;Reduced tongue base retraction;Reduced pharyngeal peristalsis;Pharyngeal residue - valleculae;Pharyngeal residue - pyriform;Pharyngeal residue - posterior pharnyx Pharyngeal -- Pharyngeal- Mechanical Soft -- Pharyngeal -- Pharyngeal- Regular -- Pharyngeal -- Pharyngeal- Multi-consistency -- Pharyngeal -- Pharyngeal- Pill Delayed swallow initiation-vallecula;Other (Comment);Pharyngeal residue - valleculae;Pharyngeal residue - pyriform;Pharyngeal residue - posterior pharnyx;Reduced laryngeal elevation;Reduced tongue base retraction;Reduced pharyngeal peristalsis Pharyngeal -- Pharyngeal Comment --  CHL IP CERVICAL ESOPHAGEAL PHASE 02/16/2019 Cervical Esophageal Phase Impaired Pudding Teaspoon -- Pudding Cup -- Honey Teaspoon -- Honey Cup Reduced cricopharyngeal relaxation Nectar Teaspoon Reduced cricopharyngeal relaxation Nectar Cup Reduced cricopharyngeal relaxation Nectar Straw NT Thin Teaspoon Reduced cricopharyngeal relaxation Thin Cup Reduced cricopharyngeal relaxation Thin Straw Reduced cricopharyngeal relaxation Puree -- Mechanical Soft -- Regular -- Multi-consistency -- Pill -- Cervical Esophageal Comment Patient exhibited one instance of regurgitation of puree solids from pharynx and orally expectorated. This appeared due to patient's UES not opening and despite patient's efforts, repeated swallows did not help.  [name] 02/16/2019, 2:38 PM      Sonia Baller, MA, CCC-SLP Speech Therapy [facility] Acute Rehab Pager: [phone]         Richarda Osmond, DO 02/17/2019, 7:53 AM PGY-1, [place] Intern pager: [phone], text pages welcome

## 2019-02-17 NOTE — Progress Notes (Signed)
Critical lab value:  Hemoglobin 6.0  Rn notified patient's RN

## 2019-02-17 NOTE — Progress Notes (Signed)
Talked with patients sister [name] about patient receiving Blood transfusion and needing consent. She stated that she would like to speak with the doctor about why he needs the transfusion and the risk from it.

## 2019-02-17 NOTE — Progress Notes (Signed)
Patient ID: Patrick Daniel, male   DOB: [birth year], 63 y.o.   MRN: [id number]  This NP visited patient at the bedside as a follow up for palliative medicine needs and emotional support.  Patient remains with garbled speech and unable to follow commands.  Again it is difficult to assess his psychiatric and cognitive status not knowing his baseline.  Continues to  tolerate meals at approximately 30%, he remains high risk for aspiration.  Family is aware of aspiration risk.  No desire for artificial feeding now or in the future per family  Placed call to update his sister and offer emotional support.  We face time and and the patient was able to Daniel both his sister and the dog.  He clearly perked up, smiled,  and was able to tell me that it was his sister and her name correctly.  Plan remains to treat the treatable and hope for improvement.  Ultimately family, both the sister and his daughter, do not believe he would want aggressive heroic measures to prolong life specifically if he could not regain his independence.  Patient was living independently prior to this admission.   Questions and concerns addressed.  Discussed with family the importance of continued conversation with each other and the  medical providers regarding overall plan of care and treatment options,  ensuring decisions are within the context of the patients values and GOCs.  Questions and concerns addressed   Discussed with Family Medicine   Total time spent on the unit was 35 minutes  Greater than 50% of the time was spent in counseling and coordination of care  Wadie Lessen NP  Palliative Medicine Team Team Phone # [phone] Pager [phone]

## 2019-02-17 NOTE — Progress Notes (Signed)
Pharmacy Antibiotic Note  Patrick Daniel is a 63 y.o. 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  Pharmacy has been consulted for acyclovir IV dosing.  Patient with AKI (likely multifactorial due to acyclovir IV therapy, vancomycin, dehydration and rhabdo on admit). Scr recovering today 3.42>>2.01, estimated CrCl ~72ml/min, UOP 1.9 ml/kg/hr  Plan: Increase acyclovir 10mg /kg IV q12h Monitor renal function and LOT.  Height: 5\' 6"  (167.6 cm) Weight: 141 lb 8.6 oz (64.2 kg) IBW/kg (Calculated) : 63.8  Temp (24hrs), Avg:98.3 F (36.8 C), Min:97.8 F (36.6 C), Max:98.8 F (37.1 C)  Recent Labs  Lab 02/10/19 1048 02/10/19 1354  02/12/19 1600  02/13/19 0657  02/14/19 0508 02/14/19 1931 02/15/19 0324 02/16/19 0438 02/17/19 0352  WBC  --   --    < >  --   --  13.8*  --  13.3*  --  12.9* 14.9* 13.9*  CREATININE  --   --    < >  --    < >  --    < > 3.60* 4.11* 4.11* 3.42* 2.01*  LATICACIDVEN 1.4 1.3  --   --   --   --   --   --   --   --   --   --   VANCOTROUGH  --   --   --  <4*  --   --   --   --   --   --   --   --    < > = values in this interval not displayed.    Estimated Creatinine Clearance: 35.3 mL/min (A) (by C-G formula based on SCr of 2.01 mg/dL (H)).    Allergies  Allergen Reactions  . Baclofen Other (See Comments)    Weakness, drowsiness   Thank you for involving pharmacy in this patient's care.  Juanell Fairly, PharmD PGY1 Pharmacy Resident 02/17/2019 8:41 AM

## 2019-02-17 NOTE — Progress Notes (Signed)
OT Cancellation Note  Patient Details Name: Patrick Daniel MRN: [id number] DOB: December 20, 1955   Cancelled Treatment:     Reason Treatment not completed: Medical issues which prohibited therapy.  Noted pt's Hg is 6.0 g/dl. Will hold OT treatment session at this time and check back for medical readiness to participate.   Quay Burow, OTR/L 02/17/2019, 8:20 AM

## 2019-02-17 NOTE — Progress Notes (Signed)
[facility] Progress Note  Name: Tyreque Finken MRN: [id number] DOB: 1956-02-07  Chief Complaint:  Found down   Subjective:  Patient with 3 liters UOP over 4/22.  He has been on 1/2 NS.  He is unable to provide history.  Spoke with nursing.  Review of systems:  Limited with dysarthria and AMS; denies any overt shortness of breath  ------------- Background on consult:  Leticia Mcdiarmid is a 63 y.o. male with a past medical history of cervical dystonia who presented to the hospital after being found down after a fall; he was also confused.  Individuals at his independent living facility had noted that his packages were stacking up and alerted staff who located the patient.  He has developed AKI over the course of his hospitalization and nephrology is consulted for assistance with management of the same.  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  He has had issues with dysphasia and note that per infectious disease he is possibly candidate to complete the remainder of his 3-week treatment with p.o. Valtrex once he is able to swallow pills.  He was on vancomycin for coag negative staph bacteremia and ID reported plans to switch to linezolid in the setting of his AKI.  CT earlier this admission is negative for hydro.  He has also developed hypernatremia and per charting has pulled out multiple feeding tubes.  He had his Foley catheter placed earlier today and reportedly had also pulled one out previously.  Per the nurse, this was a difficult Foley placement and he had 950 mL in his bladder prior to Foley.  Per nursing his sister is his contact Georgia Duff at [phone].    Intake/Output Summary (Last 24 hours) at 02/17/2019 0832 Last data filed at 02/17/2019 0557 Gross per 24 hour  Intake 3058.06 ml  Output 3000 ml  Net 58.06 ml    Vitals:  Vitals:   02/16/19 2315 02/17/19 0305 02/17/19 0401 02/17/19 0740  BP: (!) 171/84  (!) 154/89 122/84  Pulse: (!)  115  (!) 112 (!) 111  Resp: 20  20 17   Temp: 97.8 F (36.6 C)  97.8 F (36.6 C) 98.8 F (37.1 C)  TempSrc: Oral  Oral Oral  SpO2: 96%  95% 96%  Weight:  64.2 kg    Height:         Physical Exam:  General elderly male in bed in no acute distress. Chronically ill appearing and sitting in feces  Lungs clear to auscultation bilaterally normal work of breathing at rest  Heart tachycardia; no rub Abdomen soft nontender nondistended Extremities no edema; mittens Psych calm on exam  Neuro dysarthria.  Follows commands     Medications reviewed   Labs:  BMP Latest Ref Rng & Units 02/17/2019 02/16/2019 02/15/2019  Glucose 70 - 99 mg/dL 106(H) 140(H) 158(H)  BUN 6 - 20 mg/dL 55(H) 50(H) 49(H)  Creatinine 0.61 - 1.24 mg/dL 2.01(H) 3.42(H) 4.11(H)  Sodium 135 - 145 mmol/L 144 142 149(H)  Potassium 3.5 - 5.1 mmol/L 3.9 3.9 3.8  Chloride 98 - 111 mmol/L 113(H) 109 117(H)  CO2 22 - 32 mmol/L 19(L) 20(L) 22  Calcium 8.9 - 10.3 mg/dL 8.2(L) 8.4(L) 8.5(L)     Assessment/Plan:   # Acute renal failure; non-oliguric and improving - Multifactorial with insults of ATN, urinary retention, mild rhabdo, and IV acyclovir - Continue hydration with IV fluids - Would transition off of IV acyclovir when able per ID discretion  -  Strict ins and outs - Continue supportive care  - Continue foley catheter  - No acute indication for dialysis.  Note palliative consulted and invasive measures do not seem consistent with goals.  Goals of care discussions are ongoing with his family  # HSV encephalitis  -Currently on IV acyclovir per ID  # Hypernatremia  - Resolving free water deficit  - 1/2 normal saline ordered for additional 24 hours - Further fluids and free water replacement per primary team   # Urinary retention  - Continue foley catheter   # Bacteremia -Felt to be transient bacteremia versus skin contaminant per ID - On Linezolid per ID  # Dysphagia  - neurology has evaluated  -  history of myelopathy and cervical dystonia  # Anemia - PRBC"s per primary team   Thank you for the consult.  Will sign off.  Please do not hesitate to contact me with questions regarding our patient.   Claudia Desanctis, MD 02/17/2019 8:32 AM

## 2019-02-17 NOTE — Progress Notes (Signed)
SLP Cancellation Note  Patient Details Name: Patrick Daniel MRN: [id number] DOB: 1956/01/25   Cancelled treatment:       Reason Eval/Treat Not Completed: Patient's level of consciousness. Patient asleep and not arousable. SLP spoke with patient's MD regarding his dysphagia and PO recommendations, medication recommendations (recommending liquid form if possible)   [name] 02/17/2019, 4:45 PM   Sonia Baller, MA, [place] Speech Therapy [facility] Acute Rehab Pager: [phone]

## 2019-02-17 NOTE — Progress Notes (Signed)
PT Cancellation Note  Patient Details Name: Patrick Daniel MRN: [id number] DOB: 1956/01/06   Cancelled Treatment:    Reason Eval/Treat Not Completed: Medical issues which prohibited therapy. Noted pt's Hg is 6.0 g/dL. Will hold PT treatment session at this time and check back for medical readiness to participate.    Thelma Comp 02/17/2019, 7:09 AM   Rolinda Roan, PT, DPT Acute Rehabilitation Services Pager: [phone] Office: [phone]

## 2019-02-17 NOTE — Progress Notes (Signed)
[facility] for Infectious Disease  Date of Admission:  02/09/2019     Total days of antibiotics 9 Acyclovir, day 9           Patient ID: Patrick Daniel is a 63 y.o. 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 sodium chloride   Intravenous Once  . amLODipine  10 mg Oral Daily  . chlorhexidine  15 mL Mouth Rinse BID  . mouth rinse  15 mL Mouth Rinse q12n4p  . tamsulosin  0.4 mg Oral Daily    SUBJECTIVE: Patient is sleeping soundly upon my entry to his room.  I did not wake him up.   Review of Systems: Review of Systems  Unable to perform ROS: Mental status change    Allergies  Allergen Reactions  . Baclofen Other (See Comments)    Weakness, drowsiness    OBJECTIVE: Vitals:   02/17/19 0740 02/17/19 1042 02/17/19 1058 02/17/19 1404  BP: 122/84 (!) 148/86 (!) 143/86 137/73  Pulse: (!) 111 (!) 114 (!) 104 (!) 109  Resp: 17 16 16 18   Temp: 98.8 F (37.1 C) 99.2 F (37.3 C) 98.7 F (37.1 C) 98.9 F (37.2 C)  TempSrc: Oral Oral Oral Oral  SpO2: 96% 97% 97% 98%  Weight:      Height:       Body mass index is 22.84 kg/m.  Physical Exam Constitutional:      Comments: Sleeping and resting comfortably in bed  HENT:     Mouth/Throat:     Mouth: Mucous membranes are dry.     Comments: Dry mouth  Cardiovascular:     Rate and Rhythm: Regular rhythm. Tachycardia present.  Pulmonary:     Effort: Pulmonary effort is normal.  Skin:    Comments: Multiple scabbed abrasions on knees. All healing w/o signs of infection.      Lab Results Lab Results  Component Value Date   WBC 13.9 (H)  02/17/2019   HGB 6.0 (LL) 02/17/2019   HCT 18.1 (L) 02/17/2019   MCV 94.8 02/17/2019   PLT 256 02/17/2019    Lab Results  Component Value Date   CREATININE 2.01 (H) 02/17/2019   BUN 55 (H) 02/17/2019   NA 144 02/17/2019   K 3.9 02/17/2019   CL 113 (H) 02/17/2019   CO2 19 (L) 02/17/2019    Lab Results  Component Value Date   ALT 39 02/11/2019   AST 51 (H) 02/11/2019   ALKPHOS 55 02/11/2019   BILITOT 1.3 (H) 02/11/2019     Microbiology: Recent Results (from the past 240 hour(s))  Blood culture (routine x 2)     Status: Abnormal   Collection Time: 02/09/19  5:08 PM  Result Value Ref Range Status   Specimen Description BLOOD RIGHT ANTECUBITAL  Final   Special Requests   Final    BOTTLES DRAWN AEROBIC AND ANAEROBIC Blood Culture results may not be optimal due to an inadequate volume of blood received in culture bottles   Culture  Setup Time   Final    GRAM POSITIVE COCCI IN  CLUSTERS IN BOTH AEROBIC AND ANAEROBIC BOTTLES CRITICAL RESULT CALLED TO, READ BACK BY AND VERIFIED WITH: PHARMD ERIN DEJA AT 1332 ON 02/10/2019 BY MHOUEGNIFIO    Culture (A)  Final    STAPHYLOCOCCUS SPECIES (COAGULASE NEGATIVE) THE SIGNIFICANCE OF ISOLATING THIS ORGANISM FROM A SINGLE SET OF BLOOD CULTURES WHEN MULTIPLE SETS ARE DRAWN IS UNCERTAIN. PLEASE NOTIFY THE MICROBIOLOGY DEPARTMENT WITHIN ONE WEEK IF SPECIATION AND SENSITIVITIES ARE REQUIRED. Performed at [facility], [place] [address] [place], [place] [address]    Report Status 02/12/2019 FINAL  Final  Blood Culture ID Panel (Reflexed)     Status: Abnormal   Collection Time: 02/09/19  5:08 PM  Result Value Ref Range Status   Enterococcus species NOT DETECTED NOT DETECTED Final   Listeria monocytogenes NOT DETECTED NOT DETECTED Final   Staphylococcus species DETECTED (A) NOT DETECTED Final    Comment: Methicillin (oxacillin) resistant coagulase negative staphylococcus. Possible blood culture contaminant (unless isolated from more than one  blood culture draw or clinical case suggests pathogenicity). No antibiotic treatment is indicated for blood  culture contaminants. CRITICAL RESULT CALLED TO, READ BACK BY AND VERIFIED WITH: PHARMD ERIN DEJA AT 1332 ON 02/10/2019 BY MHOUEGNIFIO    Staphylococcus aureus (BCID) NOT DETECTED NOT DETECTED Final   Methicillin resistance DETECTED (A) NOT DETECTED Final    Comment: CRITICAL RESULT CALLED TO, READ BACK BY AND VERIFIED WITH: PHARMD ERIN DEJA AT 1332 ON 02/10/2019 BY MHOUEGNIFIO    Streptococcus species NOT DETECTED NOT DETECTED Final   Streptococcus agalactiae NOT DETECTED NOT DETECTED Final   Streptococcus pneumoniae NOT DETECTED NOT DETECTED Final   Streptococcus pyogenes NOT DETECTED NOT DETECTED Final   Acinetobacter baumannii NOT DETECTED NOT DETECTED Final   Enterobacteriaceae species NOT DETECTED NOT DETECTED Final   Enterobacter cloacae complex NOT DETECTED NOT DETECTED Final   Escherichia coli NOT DETECTED NOT DETECTED Final   Klebsiella oxytoca NOT DETECTED NOT DETECTED Final   Klebsiella pneumoniae NOT DETECTED NOT DETECTED Final   Proteus species NOT DETECTED NOT DETECTED Final   Serratia marcescens NOT DETECTED NOT DETECTED Final   Haemophilus influenzae NOT DETECTED NOT DETECTED Final   Neisseria meningitidis NOT DETECTED NOT DETECTED Final   Pseudomonas aeruginosa NOT DETECTED NOT DETECTED Final   Candida albicans NOT DETECTED NOT DETECTED Final   Candida glabrata NOT DETECTED NOT DETECTED Final   Candida krusei NOT DETECTED NOT DETECTED Final   Candida parapsilosis NOT DETECTED NOT DETECTED Final   Candida tropicalis NOT DETECTED NOT DETECTED Final    Comment: Performed at [facility], [place]. [address], [place], [place] [address]  Blood culture (routine x 2)     Status: None   Collection Time: 02/09/19  5:15 PM  Result Value Ref Range Status   Specimen Description BLOOD LEFT ANTECUBITAL  Final   Special Requests   Final    BOTTLES DRAWN AEROBIC AND  ANAEROBIC Blood Culture adequate volume   Culture   Final    NO GROWTH 5 DAYS Performed at [facility], [place] [address], [place], [place] [address]    Report Status 02/14/2019 FINAL  Final  CSF culture     Status: None   Collection Time: 02/09/19 10:45 PM  Result Value Ref Range Status   Specimen Description CSF  Final   Special Requests Normal  Final   Gram Stain   Final    WBC PRESENT, PREDOMINANTLY MONONUCLEAR NO ORGANISMS SEEN CYTOSPIN SMEAR    Culture  Final    NO GROWTH Performed at [facility], [place] [address] [place], [place] [address]    Report Status 02/13/2019 FINAL  Final  Culture, fungus without smear     Status: None (Preliminary result)   Collection Time: 02/09/19 10:45 PM  Result Value Ref Range Status   Specimen Description CSF  Final   Special Requests Normal  Final   Culture   Final    NO FUNGUS ISOLATED AFTER 7 DAYS Performed at [facility], [place] [address] [place], [place] [address]    Report Status PENDING  Incomplete  Anaerobic culture     Status: None   Collection Time: 02/09/19 10:45 PM  Result Value Ref Range Status   Specimen Description CSF  Final   Special Requests Normal  Final   Culture   Final    NO ANAEROBES ISOLATED Performed at [facility], [place] [address], [place], [place] [address]    Report Status 02/14/2019 FINAL  Final  Culture, blood (routine x 2)     Status: None   Collection Time: 02/11/19  9:01 AM  Result Value Ref Range Status   Specimen Description BLOOD RIGHT ANTECUBITAL  Final   Special Requests   Final    BOTTLES DRAWN AEROBIC ONLY Blood Culture adequate volume   Culture   Final    NO GROWTH 5 DAYS Performed at [facility], [place] [address], [place], [place] [address]    Report Status 02/16/2019 FINAL  Final  Culture, blood (routine x 2)     Status: None   Collection Time: 02/11/19  9:01 AM  Result Value Ref Range Status   Specimen Description BLOOD RIGHT ARM  Final   Special  Requests   Final    BOTTLES DRAWN AEROBIC ONLY Blood Culture adequate volume   Culture   Final    NO GROWTH 5 DAYS Performed at [facility], [place] [address] [place], [place] [address]    Report Status 02/16/2019 FINAL  Final  Culture, Urine     Status: None   Collection Time: 02/14/19  2:47 PM  Result Value Ref Range Status   Specimen Description URINE, CATHETERIZED  Final   Special Requests NONE  Final   Culture   Final    NO GROWTH Performed at [facility], [place] [address] [place],  [address]    Report Status 02/15/2019 FINAL  Final     ASSESSMENT & PLAN:  1. 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 He had been recovering nicely with IV acyclovir however has had trouble with associated nephropathy from this regimen.  Fortunately he was cleared recently to swallow pills, however I worry in reading the notes from palliative and nutrition team that he is not taking in enough consistently and worry he will refuse his Valtrex p.o.  His creatinine has improved.  Would favor continuing on with IV acyclovir at present.  2. Methicillin Resistant CoNS Bacteremia = 1/2 blood cultures positive for MR-CoNS likely arising from open wounds on skin from his prolonged time he was on the floor PTA. Given his multiple open lesions may be transient bacteremia vs skin contaminant from draw. Completed 7d anti-staphylococcal coverage.   3. Medication Monitoring = Continue to check BMP daily while on IV acyclovir given crystal-induced nephropathy with medication.   4. Rhabdo/AKI = creatinine improved to 2.01.  Continues to make adequate urine.  Continue to support with IV hydration while he has poor oral intake.  5. Palliative Care = reviewed note from today.  Janene Madeira, MSN, NP-C [facility] for Infectious Disease [email or web address] Pager: [phone]  Office: [phone] [place]: [phone]   02/17/2019  4:30 PM

## 2019-02-18 DIAGNOSIS — E870 Hyperosmolality and hypernatremia: Secondary | ICD-10-CM

## 2019-02-18 LAB — GLUCOSE, CAPILLARY
Glucose-Capillary: 110 mg/dL — ABNORMAL HIGH (ref 70–99)
Glucose-Capillary: 119 mg/dL — ABNORMAL HIGH (ref 70–99)
Glucose-Capillary: 120 mg/dL — ABNORMAL HIGH (ref 70–99)
Glucose-Capillary: 130 mg/dL — ABNORMAL HIGH (ref 70–99)
Glucose-Capillary: 162 mg/dL — ABNORMAL HIGH (ref 70–99)
Glucose-Capillary: 232 mg/dL — ABNORMAL HIGH (ref 70–99)

## 2019-02-18 LAB — CBC WITH DIFFERENTIAL/PLATELET
Abs Immature Granulocytes: 0.07 10*3/uL (ref 0.00–0.07)
Basophils Absolute: 0 10*3/uL (ref 0.0–0.1)
Basophils Relative: 0 %
Eosinophils Absolute: 0.1 10*3/uL (ref 0.0–0.5)
Eosinophils Relative: 1 %
HCT: 21.9 % — ABNORMAL LOW (ref 39.0–52.0)
Hemoglobin: 7.6 g/dL — ABNORMAL LOW (ref 13.0–17.0)
Immature Granulocytes: 1 %
Lymphocytes Relative: 10 %
Lymphs Abs: 1.2 10*3/uL (ref 0.7–4.0)
MCH: 32.1 pg (ref 26.0–34.0)
MCHC: 34.7 g/dL (ref 30.0–36.0)
MCV: 92.4 fL (ref 80.0–100.0)
Monocytes Absolute: 0.7 10*3/uL (ref 0.1–1.0)
Monocytes Relative: 6 %
Neutro Abs: 9.8 10*3/uL — ABNORMAL HIGH (ref 1.7–7.7)
Neutrophils Relative %: 82 %
Platelets: 242 10*3/uL (ref 150–400)
RBC: 2.37 MIL/uL — ABNORMAL LOW (ref 4.22–5.81)
RDW: 15.9 % — ABNORMAL HIGH (ref 11.5–15.5)
WBC: 12 10*3/uL — ABNORMAL HIGH (ref 4.0–10.5)
nRBC: 0 % (ref 0.0–0.2)

## 2019-02-18 LAB — BASIC METABOLIC PANEL
Anion gap: 13 (ref 5–15)
BUN: 39 mg/dL — ABNORMAL HIGH (ref 6–20)
CO2: 21 mmol/L — ABNORMAL LOW (ref 22–32)
Calcium: 8.4 mg/dL — ABNORMAL LOW (ref 8.9–10.3)
Chloride: 114 mmol/L — ABNORMAL HIGH (ref 98–111)
Creatinine, Ser: 1.21 mg/dL (ref 0.61–1.24)
GFR calc Af Amer: >60 mL/min (ref >60)
GFR calc non Af Amer: >60 mL/min (ref >60)
Glucose, Bld: 119 mg/dL — ABNORMAL HIGH (ref 70–99)
Potassium: 3.5 mmol/L (ref 3.5–5.1)
Sodium: 148 mmol/L — ABNORMAL HIGH (ref 135–145)

## 2019-02-18 MED ORDER — POLYETHYLENE GLYCOL 3350 17 G PO PACK
17.0000 g | PACK | Freq: Every day | ORAL | Status: DC
  Administered 2019-02-19 – 2019-03-04 (×14): 17 g via ORAL
  Filled 2019-02-18 (×14): qty 1

## 2019-02-18 MED ORDER — TAMSULOSIN HCL 0.4 MG PO CAPS
0.8000 mg | ORAL_CAPSULE | Freq: Every day | ORAL | Status: DC
  Administered 2019-02-19 – 2019-02-23 (×5): 0.8 mg via ORAL
  Filled 2019-02-18 (×5): qty 2

## 2019-02-18 MED ORDER — DEXTROSE 5 % IV SOLN
10.0000 mg/kg | Freq: Three times a day (TID) | INTRAVENOUS | Status: DC
  Administered 2019-02-18 – 2019-03-02 (×35): 650 mg via INTRAVENOUS
  Filled 2019-02-18 (×43): qty 13

## 2019-02-18 MED ORDER — SODIUM CHLORIDE 0.45 % IV SOLN
INTRAVENOUS | Status: AC
  Administered 2019-02-18 – 2019-02-20 (×4): via INTRAVENOUS

## 2019-02-18 MED ORDER — TAMSULOSIN HCL 0.4 MG PO CAPS
0.4000 mg | ORAL_CAPSULE | Freq: Once | ORAL | Status: AC
  Administered 2019-02-18: 0.4 mg via ORAL
  Filled 2019-02-18: qty 1

## 2019-02-18 MED ORDER — RESOURCE THICKENUP CLEAR PO POWD
ORAL | Status: DC | PRN
  Filled 2019-02-18 (×2): qty 125

## 2019-02-18 NOTE — Progress Notes (Signed)
[facility] for Infectious Disease  Date of Admission:  02/09/2019     Total days of antibiotics 10 Acyclovir, day 10           Patient ID: Patrick Daniel is a 63 y.o. 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 sodium chloride   Intravenous Once  . amLODipine  10 mg Oral Daily  . chlorhexidine  15 mL Mouth Rinse BID  . mouth rinse  15 mL Mouth Rinse q12n4p  . polyethylene glycol  17 g Oral Daily  . [START ON 02/19/2019] tamsulosin  0.8 mg Oral Daily    SUBJECTIVE: Sitting up in the chair today. Mittens are off and he is resting comfortably. Smiling when I enter the room. He is speaking quickly today and I am having a hard time understanding him.    Review of Systems: Review of Systems  Unable to perform ROS: Other    Allergies  Allergen Reactions  . Baclofen Other (See Comments)    Weakness, drowsiness    OBJECTIVE: Vitals:   02/18/19 0432 02/18/19 0500 02/18/19 0756 02/18/19 1201  BP: (!) 147/88  138/81 139/89  Pulse: (!) 108  (!) 105 (!) 115  Resp: 18  18 19   Temp: 98.7 F (37.1 C)  97.7 F (36.5 C) 99.2 F (37.3 C)  TempSrc: Oral  Oral Axillary  SpO2: 97%  98% 98%  Weight:  62 kg    Height:       Body mass index is 22.06 kg/m.  Physical Exam Constitutional:      Comments: Resting in chair. Awake and smiling.   HENT:     Mouth/Throat:     Mouth: Mucous membranes are dry.     Comments: Dry mouth  Cardiovascular:     Rate and Rhythm: Regular rhythm. Tachycardia present.  Pulmonary:     Effort: Pulmonary effort is normal.  Skin:    Comments: Multiple scabbed abrasions on  knees. All healing w/o signs of infection.      Lab Results Lab Results  Component Value Date   WBC 12.0 (H) 02/18/2019   HGB 7.6 (L) 02/18/2019   HCT 21.9 (L) 02/18/2019   MCV 92.4 02/18/2019   PLT 242 02/18/2019    Lab Results  Component Value Date   CREATININE 1.21 02/18/2019   BUN 39 (H) 02/18/2019   NA 148 (H) 02/18/2019   K 3.5 02/18/2019   CL 114 (H) 02/18/2019   CO2 21 (L) 02/18/2019    Lab Results  Component Value Date   ALT 39 02/11/2019   AST 51 (H) 02/11/2019   ALKPHOS 55 02/11/2019   BILITOT 1.3 (H) 02/11/2019     Microbiology: Recent Results (from the past 240 hour(s))  Blood culture (routine x 2)     Status: Abnormal   Collection Time: 02/09/19  5:08 PM  Result Value Ref Range Status   Specimen Description BLOOD RIGHT ANTECUBITAL  Final   Special Requests   Final    BOTTLES DRAWN AEROBIC AND ANAEROBIC Blood Culture results may not be optimal due to  an inadequate volume of blood received in culture bottles   Culture  Setup Time   Final    GRAM POSITIVE COCCI IN CLUSTERS IN BOTH AEROBIC AND ANAEROBIC BOTTLES CRITICAL RESULT CALLED TO, READ BACK BY AND VERIFIED WITH: PHARMD ERIN DEJA AT 8115 ON 02/10/2019 BY MHOUEGNIFIO    Culture (A)  Final    STAPHYLOCOCCUS SPECIES (COAGULASE NEGATIVE) THE SIGNIFICANCE OF ISOLATING THIS ORGANISM FROM A SINGLE SET OF BLOOD CULTURES WHEN MULTIPLE SETS ARE DRAWN IS UNCERTAIN. PLEASE NOTIFY THE MICROBIOLOGY DEPARTMENT WITHIN ONE WEEK IF SPECIATION AND SENSITIVITIES ARE REQUIRED. Performed at [facility], [place] [address] [place], [place] [address]    Report Status 02/12/2019 FINAL  Final  Blood Culture ID Panel (Reflexed)     Status: Abnormal   Collection Time: 02/09/19  5:08 PM  Result Value Ref Range Status   Enterococcus species NOT DETECTED NOT DETECTED Final   Listeria monocytogenes NOT DETECTED NOT DETECTED Final   Staphylococcus species DETECTED (A) NOT DETECTED Final    Comment: Methicillin (oxacillin)  resistant coagulase negative staphylococcus. Possible blood culture contaminant (unless isolated from more than one blood culture draw or clinical case suggests pathogenicity). No antibiotic treatment is indicated for blood  culture contaminants. CRITICAL RESULT CALLED TO, READ BACK BY AND VERIFIED WITH: PHARMD ERIN DEJA AT 1332 ON 02/10/2019 BY MHOUEGNIFIO    Staphylococcus aureus (BCID) NOT DETECTED NOT DETECTED Final   Methicillin resistance DETECTED (A) NOT DETECTED Final    Comment: CRITICAL RESULT CALLED TO, READ BACK BY AND VERIFIED WITH: PHARMD ERIN DEJA AT 1332 ON 02/10/2019 BY MHOUEGNIFIO    Streptococcus species NOT DETECTED NOT DETECTED Final   Streptococcus agalactiae NOT DETECTED NOT DETECTED Final   Streptococcus pneumoniae NOT DETECTED NOT DETECTED Final   Streptococcus pyogenes NOT DETECTED NOT DETECTED Final   Acinetobacter baumannii NOT DETECTED NOT DETECTED Final   Enterobacteriaceae species NOT DETECTED NOT DETECTED Final   Enterobacter cloacae complex NOT DETECTED NOT DETECTED Final   Escherichia coli NOT DETECTED NOT DETECTED Final   Klebsiella oxytoca NOT DETECTED NOT DETECTED Final   Klebsiella pneumoniae NOT DETECTED NOT DETECTED Final   Proteus species NOT DETECTED NOT DETECTED Final   Serratia marcescens NOT DETECTED NOT DETECTED Final   Haemophilus influenzae NOT DETECTED NOT DETECTED Final   Neisseria meningitidis NOT DETECTED NOT DETECTED Final   Pseudomonas aeruginosa NOT DETECTED NOT DETECTED Final   Candida albicans NOT DETECTED NOT DETECTED Final   Candida glabrata NOT DETECTED NOT DETECTED Final   Candida krusei NOT DETECTED NOT DETECTED Final   Candida parapsilosis NOT DETECTED NOT DETECTED Final   Candida tropicalis NOT DETECTED NOT DETECTED Final    Comment: Performed at [facility], [place]. [address], [place], [place] [address]  Blood culture (routine x 2)     Status: None   Collection Time: 02/09/19  5:15 PM  Result Value Ref Range Status    Specimen Description BLOOD LEFT ANTECUBITAL  Final   Special Requests   Final    BOTTLES DRAWN AEROBIC AND ANAEROBIC Blood Culture adequate volume   Culture   Final    NO GROWTH 5 DAYS Performed at [facility], [place] [address], [place], [place] [address]    Report Status 02/14/2019 FINAL  Final  CSF culture     Status: None   Collection Time: 02/09/19 10:45 PM  Result Value Ref Range Status   Specimen Description CSF  Final   Special Requests Normal  Final  Gram Stain   Final    WBC PRESENT, PREDOMINANTLY MONONUCLEAR NO ORGANISMS SEEN CYTOSPIN SMEAR    Culture   Final    NO GROWTH Performed at [facility], [place] [address] [place], [place] [address]    Report Status 02/13/2019 FINAL  Final  Culture, fungus without smear     Status: None (Preliminary result)   Collection Time: 02/09/19 10:45 PM  Result Value Ref Range Status   Specimen Description CSF  Final   Special Requests Normal  Final   Culture   Final    NO FUNGUS ISOLATED AFTER 7 DAYS Performed at [facility], [place] [address], [place], [place] [address]    Report Status PENDING  Incomplete  Anaerobic culture     Status: None   Collection Time: 02/09/19 10:45 PM  Result Value Ref Range Status   Specimen Description CSF  Final   Special Requests Normal  Final   Culture   Final    NO ANAEROBES ISOLATED Performed at [facility], [place] [address], [place], [place] [address]    Report Status 02/14/2019 FINAL  Final  Culture, blood (routine x 2)     Status: None   Collection Time: 02/11/19  9:01 AM  Result Value Ref Range Status   Specimen Description BLOOD RIGHT ANTECUBITAL  Final   Special Requests   Final    BOTTLES DRAWN AEROBIC ONLY Blood Culture adequate volume   Culture   Final    NO GROWTH 5 DAYS Performed at [facility], [place] [address], [place], [place] [address]    Report Status 02/16/2019 FINAL  Final  Culture, blood (routine x 2)     Status: None   Collection Time:  02/11/19  9:01 AM  Result Value Ref Range Status   Specimen Description BLOOD RIGHT ARM  Final   Special Requests   Final    BOTTLES DRAWN AEROBIC ONLY Blood Culture adequate volume   Culture   Final    NO GROWTH 5 DAYS Performed at [facility], [place] [address] [place], [place] [address]    Report Status 02/16/2019 FINAL  Final  Culture, Urine     Status: None   Collection Time: 02/14/19  2:47 PM  Result Value Ref Range Status   Specimen Description URINE, CATHETERIZED  Final   Special Requests NONE  Final   Culture   Final    NO GROWTH Performed at [facility], [place] [address] [place], [place] [address]    Report Status 02/15/2019 FINAL  Final     ASSESSMENT & PLAN:  1. 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 He had been recovering nicely with IV acyclovir however has had trouble with associated nephropathy from this regimen. His creatinine has improved to 1.21 today - Would favor continuing on with IV acyclovir at present and his nurse today agrees due to difficulty to get him to complete majority of his meals and concern over reliably getting in all doses (would be TID).  Would hold on PICC unless it looks like he is going to be discharged out prior to being able to confidently resume consistent diet therapy. Liquid Acyclovir has not been studied in the treatment of HSV encephalitis and only oral alternative would be valtrex crushed in puree.   2. Methicillin Resistant CoNS Bacteremia = 1/2 blood cultures positive for MR-CoNS likely arising from open wounds on  skin from his prolonged time he was on the floor PTA. Given his multiple open lesions may be transient bacteremia vs skin contaminant from draw. Completed 7d anti-staphylococcal coverage.   3. Medication Monitoring = Continue to check BMP daily while on IV acyclovir given crystal-induced nephropathy with medication. Creatinine improved 1.2  from >4.   4. Rhabdo/AKI = creatinine improved to 1.21.  Continues to make adequate urine.  Continue to support with IV hydration while he has poor oral intake.  5. Palliative Care = reviewed note from today. Noted likely discharge to SNF.   Janene Madeira, MSN, NP-C [facility] for Infectious Disease [email or web address] Pager: [phone] Office: [phone] [place]: [phone]   02/18/2019  2:57 PM

## 2019-02-18 NOTE — Progress Notes (Signed)
  Speech Language Pathology Treatment: Cognitive-Linquistic(Dysathria )  Patient Details Name: Patrick Daniel MRN: [id number] DOB: [birth year] Today's Date: 02/18/2019 Time: 0630-1601 SLP Time Calculation (min) (ACUTE ONLY): 19 min  Assessment / Plan / Recommendation Clinical Impression  Pt was alert and cooperative throughout the session but demonstrated some periods of confusion despite education regarding plan and his deficits. He was educated regarding compensatory strategies for speech intelligibility but considering his performance thereafter, the extent of his comprehension is questioned. He did not use compensatory strategies at the word or phrase levels during structured tasks despite max cues. However, he used a reduced speaking rate on three occasions during spontaneous speech when clarification was repeatedly requested from the SLP. SLP will continue to follow pt.    HPI HPI: Patrick Daniel is a 63 y.o. male presenting with history of paraplegia, myelopathy, dysarthria, cervical dystonia, (?CNS degenerative disorder )admitted from indpendendent living facility with AMS after being found down. 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 This distribution may also be seen in infection, particularly herpes encephalitis, though this seems less likely in this presentation. Sustained  aleft humerus fx, rhabdomyolsis. 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      SLP Plan  Continue with current plan of care       Recommendations                   Oral Care Recommendations: Oral care BID Follow up Recommendations: Skilled Nursing facility SLP Visit Diagnosis: Dysarthria and anarthria (R47.1) Plan: Continue with current plan of care       Patrick Daniel I. Hardin Negus, [place], [place] Office number [phone] Pager [place] 02/18/2019, 3:38 PM

## 2019-02-18 NOTE — Progress Notes (Signed)
  Speech Language Pathology Treatment: Dysphagia  Patient Details Name: Patrick Daniel MRN: [id number] DOB: 1956/10/11 Today's Date: 02/18/2019 Time: 0900-0910 SLP Time Calculation (min) (ACUTE ONLY): 10 min  Assessment / Plan / Recommendation Clinical Impression  Pt seen at the end of am meal, NT reports pt tolerating well, appetite has been better in am generally. Pt severely dysarthric but with verbal cues to repeat and simplify he was able to make simple request for [employer] coffee. Ordered thickener for the bedside. Intake continues to be relatively dis-coordinated/ataxic with intermittent wet vocal quality/cough after sips. Cues for a throat clear and second swallow seemed beneficial as pt was consistently able to follow today. Recommend pt continue current diet - puree/nectar. Will follow for needs while admitted. Unsure if there is potential for improvement with therapeutic interventions.   HPI HPI: Shaheen Mende is a 63 y.o. male presenting with history of paraplegia, myelopathy, dysarthria, cervical dystonia, (?CNS degenerative disorder )admitted [facility] with AMS after being found down. 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 This distribution may also be seen in infection, particularly herpes encephalitis, though this seems less likely in this presentation. Sustained  aleft humerus fx, rhabdomyolsis. 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      SLP Plan  Continue with current plan of care       Recommendations  Diet recommendations: Dysphagia 1 (puree);Nectar-thick liquid Liquids provided via: Cup;Straw Medication Administration: Crushed with puree Supervision: Staff to assist with self feeding Compensations: Minimize environmental distractions;Slow rate;Small sips/bites;Multiple dry swallows after each bite/sip;Clear throat  intermittently Postural Changes and/or Swallow Maneuvers: Seated upright 90 degrees                Oral Care Recommendations: Oral care BID Follow up Recommendations: Skilled Nursing facility Plan: Continue with current plan of care       GO              Herbie [place], MA [place] Pager [phone] Office [phone]   Lynann Beaver 02/18/2019, 10:06 AM

## 2019-02-18 NOTE — Progress Notes (Signed)
Family Medicine Teaching Service Daily Progress Note Intern Pager: [phone]  Patient name: Patrick Daniel Medical record number: [id number] Date of birth: 03-May-1956 Age: 63 y.o. Gender: male  Primary Care Provider: Marco Collie, MD Consultants: neuro, ID, orthopedics (s/o) Code Status: FULL  Pt Overview and Major Events to Date:  4/15 admitted having been found down, AMS, neuro consulted, LP suggestive of HSV encephalitis, acyclovir started, 1/2 Blood cx positive for Coag neg Staph, CSF cx NGTD, CSF fungal NGTD, Anaerobic cx NGTD 4/16 Cefepime discontinued  4/17 Repeat blood cultures  4/18 Vanc Discontinued, ID added linezolid 4/20 acute renal failure, nephro consulted, pt unable to keep NG down  Antimicrobials: Vancomycin 4/15-4/18 Cefepime 4/15-4/16 Acyclovir 4/15- Linezolid 4/18-4/21  Assessment and Plan: Patrick Daniel is a 63 y.o. male who presented with AMS after being found down. PMH is significant for cervical dystonia, dysarthria, HLD, gait abnormality, h/o myelopathy.  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 He requires assistance with the mitts on and has difficulty communicating. Per palliative, patient's family has declined any artificial feeding such as g-tube or ng tube and they are aware of the consequences. Patient likely to go to SNF for treatment once medically stable. I would like to consider removing his mits today so that he potentially feed himself better.  -  Per ID note yesterday, patient to continue on IV treatment at this time. There is a liquid form of acyclovir that could be used but frequency is 5x/day. (4/16-) -continue comfort feeds with nectar thick liquids. SLP recommended continued therapy at SNF - PT/OT recommending SNF  - Tylenol PRN for fever/pain - AM CBC, BMP  Methicillin Resistant CoNS Bacteremia, resolved. Likely contaminant vs from open skin wounds.  Repeat BCx neg. WBC count remains elevated but has improved to 12.0 and is neutrophil dominant. Patient remains afebrile and stable. - s/p 7d tx with Vancomycin (4/15-4/18) and Linezolid (4/18-4/21)  AKI 2/2 ATN, resolved. Cr 0.87>>peak 4.1> now improved to 1.21 this am and GFR >60. - f/u nephro recs - continue 1/2 NS at 158mL/hr  - BMP am - continue flomax  Urinary retention- removed foley for voiding trial yesterday. Patient was not able to urinate. Bladder scan reveled almost a liter of urine. Foley was replaced. - strict I/O   Hypernatremia, re-occurred. Improved from peak 157 now to 148 today. Patient continues to be on hypertonic fluids but still has poor PO intake. Would consider increasing rate. - f/u nephro recs - fluids as per above - BMP am  Anemia, stable-  Patient Hgb 7.6 this morning which is holding stable since 1 unit pRBC. -Daily CBC  Constipation:  -dulcolax prn  HTN- stable 138/81 this am - continue amlodipine 10mg  daily   L humeral neck fracture- not current surgical candidate. Surgical consult recommended to reconsult if patient verbal and able to consent/request intervention, otherwise, f/u OP - PRN tylenol, monitor for pain  FEN/GI: dysphagia 1 with nectar thick liquids  Prophylaxis: SCDs, no pharm DVT ppx d/t blood in ventricles on MRI brain  Disposition: patient's AKI has resolved. barriers to medically stable discharge are hypernatremia and treatment for HSV encephalopathy. recommended SNF  Subjective:  Patient status appears unchanged from yesterday. He has difficulty communicating and using his hands due to the mitts. He attempts to communicate very hard but I'm just not able to comprehend most of his speech.  Objective: Temp:  [97.4 F (36.3 C)-99.2 F (37.3 C)]  98.7 F (37.1 C) (04/24 0432) Pulse Rate:  [104-114] 108 (04/24 0432) Resp:  [16-19] 18 (04/24 0432) BP: (122-148)/(73-88) 147/88 (04/24 0432) SpO2:  [96 %-98 %] 97 % (04/24  0432) Weight:  [62 kg] 62 kg (04/24 0500)   Physical Exam:  General: Laying in bed comfortably with mitts on, NAD Mouth: patient has poor dentition with severe xerostomia Cardiovascular: RRR, no murmur appreciated Respiratory: Normal work of breathing on room air.  Clear to auscultation bilaterally. Abdomen: Soft, nontender, nondistended, + bowel sounds  Extremities:  Bilateral lower extremities without swelling or erythema. Neuro: awake and alert. Speech dysarthric   Laboratory: Recent Labs  Lab 02/16/19 0438 02/17/19 0352 02/17/19 1555 02/18/19 0320  WBC 14.9* 13.9*  --  12.0*  HGB 7.3* 6.0* 7.7* 7.6*  HCT 21.8* 18.1* 21.9* 21.9*  PLT 280 256  --  242   Recent Labs  Lab 02/16/19 0438 02/17/19 0352 02/18/19 0320  NA 142 144 148*  K 3.9 3.9 3.5  CL 109 113* 114*  CO2 20* 19* 21*  BUN 50* 55* 39*  CREATININE 3.42* 2.01* 1.21  CALCIUM 8.4* 8.2* 8.4*  GLUCOSE 140* 106* 119*   Iron/TIBC/Ferritin/ %Sat    Component Value Date/Time   IRON 51 02/11/2019 0342   TIBC 230 (L) 02/11/2019 0342   FERRITIN 327 02/11/2019 1350   IRONPCTSAT 22 02/11/2019 0342    Imaging/Diagnostic Tests: No results found.  Richarda Osmond, DO 02/18/2019, 7:28 AM PGY-1, [place] Intern pager: [phone], text pages welcome

## 2019-02-18 NOTE — Progress Notes (Signed)
Pharmacy Renal Adjustment Note  Patrick Daniel is a 63 y.o. 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 Pharmacy has been consulted for acyclovir IV dosing.  Patient with AKI (likely multifactorial due to acyclovir IV therapy, vancomycin, dehydration and rhabdo on admit). Scr recovering further today 3.42>>1.21, estimated CrCl ~42ml/min  Plan: Increase acyclovir 10mg /kg IV q8h Monitor renal function and LOT F/u ability to take POs as able  Thank you for involving pharmacy in this patient's care.  Janae Bridgeman, PharmD PGY1 Pharmacy Resident Phone: [phone] 02/18/2019 12:13 PM

## 2019-02-18 NOTE — Progress Notes (Signed)
Physical Therapy Treatment Patient Details Name: Patrick Daniel MRN: [id number] DOB: [birth year] Today's Date: 02/18/2019    History of Present Illness 63 y.o. male presenting with AMS after being found down at [facility]. PMH is significant for cervical dystonia, dysarthria, HLD, gait abnormality (?CNS degenerative disorder?). Patient unable to provide insight to what caused fall given chronic speech issues that have acutely worsened. Known mobility issues at baseline with frequent falls. Concern for traumatic head injury after fall vs stroke vs seizure. Head CT with temporal lobe edema. MRI with concern for acute or early subacute infarct vs infection like HSV. Pt also found to have comminuted fx of L humeral neck, ortho following.     PT Comments    Pt progressing slowly towards physical therapy goals. Pt not requiring mitts today however continued to grab at catheter and IV lines throughout session. Increased assist required for squat pivot transfer today. Will continue to follow and progress as able per POC.     Follow Up Recommendations  SNF;Supervision/Assistance - 24 hour     Equipment Recommendations  Other (comment)(TBD by next venue of care)    Recommendations for Other Services       Precautions / Restrictions Precautions Precautions: Fall;Shoulder;Other (comment) Type of Shoulder Precautions: sling, NWB  Shoulder Interventions: Shoulder sling/immobilizer Precaution Comments: watch HR; impulsive Required Braces or Orthoses: Sling Restrictions Weight Bearing Restrictions: Yes LUE Weight Bearing: Non weight bearing    Mobility  Bed Mobility Overal bed mobility: Needs Assistance Bed Mobility: Supine to Sit     Supine to sit: Mod assist;+2 for physical assistance     General bed mobility comments: +2 assist for safe transition to EOB. Initiated some movement of LE's but required therapist assist to bring LE's all the way off EOB.   Transfers Overall transfer level: Needs  assistance Equipment used: 2 person hand held assist Transfers: Squat Pivot Transfers     Squat pivot transfers: Max assist;+2 physical assistance     General transfer comment: Increased assist required this session. Pt not able to achieve full stand and squat pivot required to get to chair.   Ambulation/Gait             General Gait Details: Unable to progress to ambulation this session.    Stairs             Wheelchair Mobility    Modified Rankin (Stroke Patients Only)       Balance Overall balance assessment: Needs assistance Sitting-balance support: Feet supported Sitting balance-Leahy Scale: Poor Sitting balance - Comments: Mod to max assist required at all times for sitting balance EOB. Heavy R lateral lean Postural control: Right lateral lean                                  Cognition Arousal/Alertness: Awake/alert Behavior During Therapy: Flat affect;Impulsive Overall Cognitive Status: Impaired/Different from baseline Area of Impairment: Attention;Following commands;Safety/judgement;Awareness;Problem solving                   Current Attention Level: Focused   Following Commands: Follows one step commands inconsistently;Follows one step commands with increased time Safety/Judgement: Decreased awareness of deficits;Decreased awareness of safety Awareness: Intellectual Problem Solving: Difficulty sequencing;Requires verbal cues;Requires tactile cues;Slow processing;Decreased initiation General Comments: pt able to answer yes/no questions; requires multimodal cues to follow commands and to redirect to task at hand      Exercises  General Comments        Pertinent Vitals/Pain Pain Assessment: Faces Faces Pain Scale: Hurts little more Pain Location: Occasional grimacing with mobility    Home Living                      Prior Function            PT Goals (current goals can now be found in the care plan  section) Acute Rehab PT Goals Patient Stated Goal: none stated today, agreeable to working  with therapies PT Goal Formulation: Patient unable to participate in goal setting Time For Goal Achievement: 02/24/19 Potential to Achieve Goals: Good Progress towards PT goals: Progressing toward goals    Frequency    Min 3X/week      PT Plan Current plan remains appropriate    Co-evaluation PT/OT/SLP Co-Evaluation/Treatment: Yes Reason for Co-Treatment: Complexity of the patient's impairments (multi-system involvement);Necessary to address cognition/behavior during functional activity;For patient/therapist safety;To address functional/ADL transfers PT goals addressed during session: Mobility/safety with mobility;Balance        AM-PAC PT "6 Clicks" Mobility   Outcome Measure  Help needed turning from your back to your side while in a flat bed without using bedrails?: A Lot Help needed moving from lying on your back to sitting on the side of a flat bed without using bedrails?: A Lot Help needed moving to and from a bed to a chair (including a wheelchair)?: A Lot Help needed standing up from a chair using your arms (e.g., wheelchair or bedside chair)?: A Lot Help needed to walk in hospital room?: Total Help needed climbing 3-5 steps with a railing? : Total 6 Click Score: 10    End of Session Equipment Utilized During Treatment: Gait belt Activity Tolerance: Patient tolerated treatment well Patient left: in chair;with call bell/phone within reach;with chair alarm set;with nursing/sitter in room Nurse Communication: Mobility status PT Visit Diagnosis: Muscle weakness (generalized) (M62.81);History of falling (Z91.81);Difficulty in walking, not elsewhere classified (R26.2);Pain Pain - Right/Left: Left Pain - part of body: Shoulder     Time: 5993-5701 PT Time Calculation (min) (ACUTE ONLY): 23 min  Charges:  $Therapeutic Activity: 8-22 mins                     Rolinda Roan, PT,  DPT Acute Rehabilitation Services Pager: [phone] Office: [phone]    Thelma Comp 02/18/2019, 3:09 PM

## 2019-02-18 NOTE — Progress Notes (Signed)
Occupational Therapy Treatment Patient Details Name: Patrick Daniel MRN: [id number] DOB: 01-18-1956 Today's Date: 02/18/2019    History of present illness 63 y.o. male presenting with AMS after [facility] at [facility]. PMH is significant for cervical dystonia, dysarthria, HLD, gait abnormality (?CNS degenerative disorder?). Patient unable to provide insight to what caused fall given chronic speech issues that have acutely worsened. Known mobility issues at baseline with frequent falls. Concern for traumatic head injury after fall vs stroke vs seizure. Head CT with temporal lobe edema. MRI with concern for acute or early subacute infarct vs infection like HSV. Pt also found to have comminuted fx of L humeral neck, ortho following.    OT comments  Pt progressing towards OT goals this session. Pt continues to be difficult to understand but attempts to communicate, also continues to figit with lines/leads and so Teacher, music requested to get him a busy board. Pt was max A +2 for sit<>stand and unable to achieve full upright today, squat pivot to recliner simulating toilet transfer at max A +2. Pt required support in sitting for grooming tasks and max A for task completion - continuous cues to not use LUE and sling repositioned multiple times. Current POC remains appropriate. OT will continue to follow acutely.    Follow Up Recommendations  SNF;Supervision/Assistance - 24 hour    Equipment Recommendations  Other (comment)(defer to next venue)    Recommendations for Other Services      Precautions / Restrictions Precautions Precautions: Fall;Shoulder;Other (comment) Type of Shoulder Precautions: sling, NWB  Shoulder Interventions: Shoulder sling/immobilizer Precaution Comments: watch HR; impulsive Required Braces or Orthoses: Sling Restrictions Weight Bearing Restrictions: Yes LUE Weight Bearing: Non weight bearing       Mobility Bed Mobility Overal bed mobility: Needs Assistance Bed  Mobility: Supine to Sit     Supine to sit: Mod assist;+2 for physical assistance     General bed mobility comments: +2 assist for safe transition to EOB. Initiated some movement of LE's but required therapist assist to bring LE's all the way off EOB.   Transfers Overall transfer level: Needs assistance Equipment used: 2 person hand held assist Transfers: Squat Pivot Transfers     Squat pivot transfers: Max assist;+2 physical assistance     General transfer comment: Increased assist required this session. Pt not able to achieve full stand and squat pivot required to get to chair.  utilizing drop arm function of recliner    Balance Overall balance assessment: Needs assistance Sitting-balance support: Feet supported Sitting balance-Leahy Scale: Poor Sitting balance - Comments: Mod to max assist required at all times for sitting balance EOB. Heavy R lateral lean Postural control: Right lateral lean                                 ADL either performed or assessed with clinical judgement   ADL Overall ADL's : Needs assistance/impaired     Grooming: Maximal assistance;Sitting;Wash/dry face Grooming Details (indicate cue type and reason): requires max assist to attend to task, hand over hand support to wash face for initation                  Toilet Transfer: Maximal assistance;+2 for physical assistance;+2 for safety/equipment;Squat-pivot Toilet Transfer Details (indicate cue type and reason): simulated to recliner          Functional mobility during ADLs: Maximal assistance;+2 for physical assistance;+2 for safety/equipment General ADL Comments: continues  to be limited by impaired balance, impaired cognition and decreased act tolerance     Vision       Perception     Praxis      Cognition Arousal/Alertness: Awake/alert Behavior During Therapy: Flat affect;Impulsive Overall Cognitive Status: Impaired/Different from baseline Area of Impairment:  Attention;Following commands;Safety/judgement;Awareness;Problem solving                   Current Attention Level: Focused   Following Commands: Follows one step commands inconsistently;Follows one step commands with increased time Safety/Judgement: Decreased awareness of deficits;Decreased awareness of safety Awareness: Intellectual Problem Solving: Difficulty sequencing;Requires verbal cues;Requires tactile cues;Slow processing;Decreased initiation General Comments: pt able to answer yes/no questions; requires multimodal cues to follow commands and to redirect to task at hand        Exercises Exercises: Hand exercises Hand Exercises Wrist Flexion: AROM;Left;5 reps Wrist Extension: AROM;Left;5 reps Digit Composite Flexion: AROM;Left;5 reps Composite Extension: AROM;Left;5 reps   Shoulder Instructions       General Comments      Pertinent Vitals/ Pain       Pain Assessment: Faces Faces Pain Scale: No hurt Pain Location: Occasional grimacing with mobility Pain Intervention(s): Monitored during session;Repositioned;Other (comment)(re-adjusted sling)  Home Living                                          Prior Functioning/Environment              Frequency  Min 3X/week        Progress Toward Goals  OT Goals(current goals can now be found in the care plan section)  Progress towards OT goals: Progressing toward goals  Acute Rehab OT Goals Patient Stated Goal: none stated today, agreeable to working  with therapies OT Goal Formulation: With patient Time For Goal Achievement: 02/24/19 Potential to Achieve Goals: Good  Plan Discharge plan remains appropriate    Co-evaluation    PT/OT/SLP Co-Evaluation/Treatment: Yes Reason for Co-Treatment: Complexity of the patient's impairments (multi-system involvement);Necessary to address cognition/behavior during functional activity;For patient/therapist safety;To address functional/ADL  transfers PT goals addressed during session: Mobility/safety with mobility;Balance;Strengthening/ROM OT goals addressed during session: ADL's and self-care;Strengthening/ROM      AM-PAC OT "6 Clicks" Daily Activity     Outcome Measure   Help from another person eating meals?: A Lot Help from another person taking care of personal grooming?: A Lot Help from another person toileting, which includes using toliet, bedpan, or urinal?: Total Help from another person bathing (including washing, rinsing, drying)?: A Lot Help from another person to put on and taking off regular upper body clothing?: A Lot Help from another person to put on and taking off regular lower body clothing?: Total 6 Click Score: 10    End of Session Equipment Utilized During Treatment: Gait belt;Other (comment)(sling)  OT Visit Diagnosis: Muscle weakness (generalized) (M62.81);Other symptoms and signs involving cognitive function;History of falling (Z91.81)   Activity Tolerance Patient tolerated treatment well   Patient Left in chair;with call bell/phone within reach;with chair alarm set   Nurse Communication Mobility status(in room at end of sesison, very thorough explanation of tran)        Time: 1400-1425 OT Time Calculation (min): 25 min  Charges: OT General Charges $OT Visit: 1 Visit OT Treatments $Self Care/Home Management : 8-22 mins  Hulda Humphrey OTR/L Acute Rehabilitation Services Pager: [phone] Office: [phone]   Mickel Baas  J Eddy Liszewski 02/18/2019, 3:49 PM

## 2019-02-19 DIAGNOSIS — D649 Anemia, unspecified: Secondary | ICD-10-CM

## 2019-02-19 LAB — HEMOGLOBIN AND HEMATOCRIT, BLOOD
HCT: 24.5 % — ABNORMAL LOW (ref 39.0–52.0)
Hemoglobin: 8.4 g/dL — ABNORMAL LOW (ref 13.0–17.0)

## 2019-02-19 LAB — CBC
HCT: 17.7 % — ABNORMAL LOW (ref 39.0–52.0)
Hemoglobin: 6.2 g/dL — CL (ref 13.0–17.0)
MCH: 32.3 pg (ref 26.0–34.0)
MCHC: 35 g/dL (ref 30.0–36.0)
MCV: 92.2 fL (ref 80.0–100.0)
Platelets: 243 10*3/uL (ref 150–400)
RBC: 1.92 MIL/uL — ABNORMAL LOW (ref 4.22–5.81)
RDW: 15 % (ref 11.5–15.5)
WBC: 12.7 10*3/uL — ABNORMAL HIGH (ref 4.0–10.5)
nRBC: 0.2 % (ref 0.0–0.2)

## 2019-02-19 LAB — BASIC METABOLIC PANEL
Anion gap: 9 (ref 5–15)
BUN: 25 mg/dL — ABNORMAL HIGH (ref 6–20)
CO2: 23 mmol/L (ref 22–32)
Calcium: 8.2 mg/dL — ABNORMAL LOW (ref 8.9–10.3)
Chloride: 114 mmol/L — ABNORMAL HIGH (ref 98–111)
Creatinine, Ser: 0.99 mg/dL (ref 0.61–1.24)
GFR calc Af Amer: >60 mL/min (ref >60)
GFR calc non Af Amer: >60 mL/min (ref >60)
Glucose, Bld: 113 mg/dL — ABNORMAL HIGH (ref 70–99)
Potassium: 3.3 mmol/L — ABNORMAL LOW (ref 3.5–5.1)
Sodium: 146 mmol/L — ABNORMAL HIGH (ref 135–145)

## 2019-02-19 LAB — GLUCOSE, CAPILLARY
Glucose-Capillary: 100 mg/dL — ABNORMAL HIGH (ref 70–99)
Glucose-Capillary: 111 mg/dL — ABNORMAL HIGH (ref 70–99)
Glucose-Capillary: 114 mg/dL — ABNORMAL HIGH (ref 70–99)
Glucose-Capillary: 114 mg/dL — ABNORMAL HIGH (ref 70–99)

## 2019-02-19 LAB — PREPARE RBC (CROSSMATCH)

## 2019-02-19 MED ORDER — SODIUM CHLORIDE 0.9% IV SOLUTION
Freq: Once | INTRAVENOUS | Status: AC
  Administered 2019-02-19: 10:00:00 via INTRAVENOUS

## 2019-02-19 MED ORDER — PANTOPRAZOLE SODIUM 40 MG PO TBEC
40.0000 mg | DELAYED_RELEASE_TABLET | Freq: Every day | ORAL | Status: DC
  Administered 2019-02-19 – 2019-03-04 (×14): 40 mg via ORAL
  Filled 2019-02-19 (×14): qty 1

## 2019-02-19 NOTE — Progress Notes (Signed)
CSW acknowledges consult for SNF. CSW has been following the patient and has worked the patient up. We are searching for a bed offer.   CSW will continue to follow and assist with disposition. If you have any weekend needs please call me at [phone].   Domenic Schwab, MSW, [place]

## 2019-02-19 NOTE — Progress Notes (Signed)
INFECTIOUS DISEASE PROGRESS NOTE  ID: Patrick Daniel is a 63 y.o. 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  He interacts and is able to recall my name.  Speech initially garbled.   Abtx:  Anti-infectives (From admission, onward)   Start     Dose/Rate Route Frequency Ordered Stop   02/18/19 1400  acyclovir (ZOVIRAX) 650 mg in dextrose 5 % 100 mL IVPB     10 mg/kg  65 kg 113 mL/hr over 60 Minutes Intravenous Every 8 hours 02/18/19 0808     02/17/19 1818  acyclovir (ZOVIRAX) 650 mg in dextrose 5 % 100 mL IVPB  Status:  Discontinued     10 mg/kg  65 kg 113 mL/hr over 60 Minutes Intravenous Every 12 hours 02/17/19 0800 02/18/19 0808   02/14/19 0539  acyclovir (ZOVIRAX) 325 mg in dextrose 5 % 100 mL IVPB  Status:  Discontinued     5 mg/kg  65 kg 106.5 mL/hr over 60 Minutes Intravenous Every 24 hours 02/13/19 1340 02/13/19 1411   02/14/19 0539  acyclovir (ZOVIRAX) 650 mg in dextrose 5 % 100 mL IVPB  Status:  Discontinued     10 mg/kg  65 kg 113 mL/hr over 60 Minutes Intravenous Every 24 hours 02/13/19 1411 02/17/19 0800   02/13/19 1415  linezolid (ZYVOX) IVPB 600 mg     600 mg 300 mL/hr over 60 Minutes Intravenous Every 12 hours 02/13/19 1406 02/15/19 2225   02/13/19 0630  vancomycin (VANCOCIN) 1,000 mg in sodium chloride 0.9 % 250 mL IVPB  Status:  Discontinued     1,000 mg 250 mL/hr over 60 Minutes Intravenous Every 12 hours 02/12/19 1828 02/12/19 1944   02/12/19 1915  vancomycin (VANCOCIN) 1,250 mg in sodium chloride 0.9 % 250 mL IVPB  Status:  Discontinued      1,250 mg 166.7 mL/hr over 90 Minutes Intravenous  Once 02/12/19 1825 02/12/19 1944   02/10/19 1400  ceFEPIme (MAXIPIME) 2 g in sodium chloride 0.9 % 100 mL IVPB  Status:  Discontinued     2 g 200 mL/hr over 30 Minutes Intravenous Every 8 hours 02/10/19 0952 02/10/19 1715   02/10/19 0500  vancomycin (VANCOCIN) 500 mg in sodium chloride 0.9 % 100 mL IVPB  Status:  Discontinued     500 mg 100 mL/hr over 60 Minutes Intravenous Every 12 hours 02/09/19 1638 02/12/19 1827   02/10/19 0500  ceFEPIme (MAXIPIME) 2 g in sodium chloride 0.9 % 100 mL IVPB  Status:  Discontinued     2 g 200 mL/hr over 30 Minutes Intravenous Every 12 hours 02/09/19 1638 02/10/19 0952   02/09/19 2330  ampicillin (OMNIPEN) 2 g in sodium chloride 0.9 % 100 mL IVPB     2 g 300 mL/hr over 20 Minutes Intravenous NOW 02/09/19 2252 02/10/19 0317   02/09/19 2200  acyclovir (ZOVIRAX) 650 mg in dextrose 5 % 100 mL IVPB  Status:  Discontinued     10 mg/kg  65 kg 113 mL/hr over 60 Minutes Intravenous Every 8 hours 02/09/19 2154 02/13/19 1340  02/09/19 1630  ceFEPIme (MAXIPIME) 2 g in sodium chloride 0.9 % 100 mL IVPB     2 g 200 mL/hr over 30 Minutes Intravenous  Once 02/09/19 1619 02/09/19 1836   02/09/19 1630  metroNIDAZOLE (FLAGYL) IVPB 500 mg     500 mg 100 mL/hr over 60 Minutes Intravenous  Once 02/09/19 1619 02/09/19 1843   02/09/19 1630  vancomycin (VANCOCIN) IVPB 1000 mg/200 mL premix     1,000 mg 200 mL/hr over 60 Minutes Intravenous  Once 02/09/19 1619 02/09/19 1845      Medications:  Scheduled: . sodium chloride   Intravenous Once  . sodium chloride   Intravenous Once  . amLODipine  10 mg Oral Daily  . chlorhexidine  15 mL Mouth Rinse BID  . mouth rinse  15 mL Mouth Rinse q12n4p  . polyethylene glycol  17 g Oral Daily  . tamsulosin  0.8 mg Oral Daily    Objective: Vital signs in last 24 hours: Temp:  [98.3 F (36.8 C)-99.2 F (37.3 C)] 98.6 F (37 C) (04/25 0945) Pulse Rate:  [92-122] 92 (04/25  0945) Resp:  [16-19] 18 (04/25 0945) BP: (112-139)/(60-89) 122/60 (04/25 0945) SpO2:  [96 %-99 %] 96 % (04/25 0945)   General appearance: alert, cooperative and no distress Neck: FROM wit no resistance.  Resp: clear to auscultation bilaterally Cardio: regular rate and rhythm GI: normal findings: bowel sounds normal and soft, non-tender Extremities: edema none  Lab Results Recent Labs    02/18/19 0320 02/19/19 0343 02/19/19 0737  WBC 12.0*  --  12.7*  HGB 7.6*  --  6.2*  HCT 21.9*  --  17.7*  NA 148* 146*  --   K 3.5 3.3*  --   CL 114* 114*  --   CO2 21* 23  --   BUN 39* 25*  --   CREATININE 1.21 0.99  --    Liver Panel No results for input(s): PROT, ALBUMIN, AST, ALT, ALKPHOS, BILITOT, BILIDIR, IBILI in the last 72 hours. Sedimentation Rate No results for input(s): ESRSEDRATE in the last 72 hours. C-Reactive Protein No results for input(s): CRP in the last 72 hours.  Microbiology: Recent Results (from the past 240 hour(s))  Blood culture (routine x 2)     Status: Abnormal   Collection Time: 02/09/19  5:08 PM  Result Value Ref Range Status   Specimen Description BLOOD RIGHT ANTECUBITAL  Final   Special Requests   Final    BOTTLES DRAWN AEROBIC AND ANAEROBIC Blood Culture results may not be optimal due to an inadequate volume of blood received in culture bottles   Culture  Setup Time   Final    GRAM POSITIVE COCCI IN CLUSTERS IN BOTH AEROBIC AND ANAEROBIC BOTTLES CRITICAL RESULT CALLED TO, READ BACK BY AND VERIFIED WITH: PHARMD ERIN DEJA AT 4268 ON 02/10/2019 BY MHOUEGNIFIO    Culture (A)  Final    STAPHYLOCOCCUS SPECIES (COAGULASE NEGATIVE) THE SIGNIFICANCE OF ISOLATING THIS ORGANISM FROM A SINGLE SET OF BLOOD CULTURES WHEN MULTIPLE SETS ARE DRAWN IS UNCERTAIN. PLEASE NOTIFY THE MICROBIOLOGY DEPARTMENT WITHIN ONE WEEK IF SPECIATION AND SENSITIVITIES ARE REQUIRED. Performed at [facility], [place] [address], [place], [place] [address]    Report Status 02/12/2019  FINAL  Final  Blood Culture ID Panel (Reflexed)     Status: Abnormal   Collection Time: 02/09/19  5:08 PM  Result Value Ref Range Status   Enterococcus species NOT DETECTED NOT DETECTED Final   Listeria monocytogenes NOT  DETECTED NOT DETECTED Final   Staphylococcus species DETECTED (A) NOT DETECTED Final    Comment: Methicillin (oxacillin) resistant coagulase negative staphylococcus. Possible blood culture contaminant (unless isolated from more than one blood culture draw or clinical case suggests pathogenicity). No antibiotic treatment is indicated for blood  culture contaminants. CRITICAL RESULT CALLED TO, READ BACK BY AND VERIFIED WITH: PHARMD ERIN DEJA AT 1332 ON 02/10/2019 BY MHOUEGNIFIO    Staphylococcus aureus (BCID) NOT DETECTED NOT DETECTED Final   Methicillin resistance DETECTED (A) NOT DETECTED Final    Comment: CRITICAL RESULT CALLED TO, READ BACK BY AND VERIFIED WITH: PHARMD ERIN DEJA AT 1332 ON 02/10/2019 BY MHOUEGNIFIO    Streptococcus species NOT DETECTED NOT DETECTED Final   Streptococcus agalactiae NOT DETECTED NOT DETECTED Final   Streptococcus pneumoniae NOT DETECTED NOT DETECTED Final   Streptococcus pyogenes NOT DETECTED NOT DETECTED Final   Acinetobacter baumannii NOT DETECTED NOT DETECTED Final   Enterobacteriaceae species NOT DETECTED NOT DETECTED Final   Enterobacter cloacae complex NOT DETECTED NOT DETECTED Final   Escherichia coli NOT DETECTED NOT DETECTED Final   Klebsiella oxytoca NOT DETECTED NOT DETECTED Final   Klebsiella pneumoniae NOT DETECTED NOT DETECTED Final   Proteus species NOT DETECTED NOT DETECTED Final   Serratia marcescens NOT DETECTED NOT DETECTED Final   Haemophilus influenzae NOT DETECTED NOT DETECTED Final   Neisseria meningitidis NOT DETECTED NOT DETECTED Final   Pseudomonas aeruginosa NOT DETECTED NOT DETECTED Final   Candida albicans NOT DETECTED NOT DETECTED Final   Candida glabrata NOT DETECTED NOT DETECTED Final   Candida krusei  NOT DETECTED NOT DETECTED Final   Candida parapsilosis NOT DETECTED NOT DETECTED Final   Candida tropicalis NOT DETECTED NOT DETECTED Final    Comment: Performed at [facility], [place]. [address] [place], [place] [address]  Blood culture (routine x 2)     Status: None   Collection Time: 02/09/19  5:15 PM  Result Value Ref Range Status   Specimen Description BLOOD LEFT ANTECUBITAL  Final   Special Requests   Final    BOTTLES DRAWN AEROBIC AND ANAEROBIC Blood Culture adequate volume   Culture   Final    NO GROWTH 5 DAYS Performed at [facility], [place] [address] [place], [place] [address]    Report Status 02/14/2019 FINAL  Final  CSF culture     Status: None   Collection Time: 02/09/19 10:45 PM  Result Value Ref Range Status   Specimen Description CSF  Final   Special Requests Normal  Final   Gram Stain   Final    WBC PRESENT, PREDOMINANTLY MONONUCLEAR NO ORGANISMS SEEN CYTOSPIN SMEAR    Culture   Final    NO GROWTH Performed at [facility], [place] [address], [place], [place] [address]    Report Status 02/13/2019 FINAL  Final  Culture, fungus without smear     Status: None (Preliminary result)   Collection Time: 02/09/19 10:45 PM  Result Value Ref Range Status   Specimen Description CSF  Final   Special Requests Normal  Final   Culture   Final    NO FUNGUS ISOLATED AFTER 7 DAYS Performed at [facility], [place] [address] [place], [place] [address]    Report Status PENDING  Incomplete  Anaerobic culture     Status: None   Collection Time: 02/09/19 10:45 PM  Result Value Ref Range Status   Specimen Description CSF  Final   Special Requests  Normal  Final   Culture   Final    NO ANAEROBES ISOLATED Performed at [facility], [place] [address] [place], [place] [address]    Report Status 02/14/2019 FINAL  Final  Culture, blood (routine x 2)     Status: None   Collection Time: 02/11/19  9:01 AM  Result Value Ref Range Status   Specimen Description  BLOOD RIGHT ANTECUBITAL  Final   Special Requests   Final    BOTTLES DRAWN AEROBIC ONLY Blood Culture adequate volume   Culture   Final    NO GROWTH 5 DAYS Performed at [facility], [place] [address], [place], [place] [address]    Report Status 02/16/2019 FINAL  Final  Culture, blood (routine x 2)     Status: None   Collection Time: 02/11/19  9:01 AM  Result Value Ref Range Status   Specimen Description BLOOD RIGHT ARM  Final   Special Requests   Final    BOTTLES DRAWN AEROBIC ONLY Blood Culture adequate volume   Culture   Final    NO GROWTH 5 DAYS Performed at [facility], [place] [address], [place], [place] [address]    Report Status 02/16/2019 FINAL  Final  Culture, Urine     Status: None   Collection Time: 02/14/19  2:47 PM  Result Value Ref Range Status   Specimen Description URINE, CATHETERIZED  Final   Special Requests NONE  Final   Culture   Final    NO GROWTH Performed at [facility], [place] [address], [place], [place] [address]    Report Status 02/15/2019 FINAL  Final    Studies/Results: No results found.   Assessment/Plan: HSV encephalitis ARF Anemia  Total days of antibiotics: 11 acyclovir  Would aim for 21 days of acyclovir  Could change to PO valtrex if wanting to d/c before end of therapy His Cr has improved, appreciate pharm help with dosing of acyclovir Will defer to primary team as to cause of his anemia.  Available as needed.          Mann Rumpf MD, FACP Infectious Diseases (pager) [phone] [email or web address] 02/19/2019, 10:30 AM  LOS: 10 days

## 2019-02-19 NOTE — Progress Notes (Signed)
Family Medicine Teaching Service Daily Progress Note Intern Pager: [phone]  Patient name: Patrick Daniel Medical record number: [id number] Date of birth: April 02, 1956 Age: 63 y.o. Gender: male  Primary Care Provider: Marco Collie, MD Consultants: neuro, ID, orthopedics (s/o) Code Status: FULL  Pt Overview and Major Events to Date:  4/15 admitted having been found down, AMS, neuro consulted, LP suggestive of HSV encephalitis, acyclovir started, 1/2 Blood cx positive for Coag neg Staph, CSF cx NGTD, CSF fungal NGTD, Anaerobic cx NGTD 4/16 Cefepime discontinued  4/17 Repeat blood cultures  4/18 Vanc Discontinued, ID added linezolid 4/20 acute renal failure, nephro consulted, pt unable to keep NG down  Antimicrobials: Vancomycin 4/15-4/18 Cefepime 4/15-4/16 Acyclovir 4/15- Linezolid 4/18-4/21  Assessment and Plan: Patrick Daniel is a 63 y.o. male who presented with AMS after being found down. PMH is significant for cervical dystonia, dysarthria, HLD, gait abnormality, h/o myelopathy.   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 ate 30-40% of meals yesterday. WBC count increased today to 12.7. patient not as conversive this morning. Will reach out to ID for recs today. Consulted CSW for SNF placement since patient is nearing point of discharge planning. Added protonix to regimen.  - f/u ID recs - continue IV acyclovir x3 weeks (4/16-), possibly transition to PO.  - Tylenol PRN for fever/pain - AM CBC, BMP   Methicillin Resistant CoNS Bacteremia. Patient remains afebrile but WBC count increasing to 12.7 and was neutrophil dominant on diff yesterday. He was not as interactive today. His heart rate has also increased to mild tachy over the past day. s/p 7d tx with Vancomycin (4/15-4/18) and Linezolid (4/18-4/21) with repeat Bcx negative - f/u ID recs - monitor temp - CBC am  AKI 2/2 ATN, resolved. Cr 0.87>>peak 4.1> now improved to 0.99 this am  and GFR >60. - f/u nephro recs - continue 1/2 NS at 153mL/hr  - BMP am - continue flomax  Urinary retention- foley in place. Will plan on voiding trial on Monday. About 1.6L urine output yesterday - strict I/O   Hypernatremia- Improved from peak 157 now to 146. Patient continues to be on hypertonic fluids and increased rate yesterday for 48 hours. Plan to stop them at the time he has a normal value to monitor his ability. Nephrology following but I did not see a note from yesterday. - f/u nephro recs - 171mL/hr half NS x48 hours and then trial off - BMP am  Anemia, stable-  Patient Hgb 6.2 this morning down from 7.6 yesterday. Contacted sister for consent for transfusion and ordered 1 unit. Will likely administer more than 1 unit as patient continue to decrease hgb without clear source. No hematoma at arm fracture. - f/u FOBT -Daily CBC  Constipation:  -dulcolax prn  HTN- stable 120/73 this am - continue amlodipine 10mg  daily   L humeral neck fracture- not current surgical candidate. Surgical consult recommended to reconsult if patient verbal and able to consent/request intervention, otherwise, f/u OP. No signs of hematoma presently. - PRN tylenol, monitor for pain - inspect daily for signs of infection/bleeding  FEN/GI: dysphagia 1 with nectar thick liquids , protonix 40mg  Prophylaxis: SCDs, no pharm DVT ppx d/t blood in ventricles on MRI brain  Disposition: patient's AKI has resolved. barriers to medically stable discharge are hypernatremia and treatment for HSV encephalopathy. recommended SNF. Patient potentially stable for discharge Monday after checking voiding trial, sodium level off fluids, and can tolerate PO acyclovir.  Subjective:  Patient was sleeping on arrival and had not eaten breakfast. He was easily aroused to voice but remained sleepy throughout interaction.  Objective: Temp:  [97.7 F (36.5 C)-99.2 F (37.3 C)] 98.7 F (37.1 C) (04/25 0352) Pulse Rate:   [105-122] 109 (04/25 0352) Resp:  [17-19] 18 (04/25 0352) BP: (112-139)/(64-89) 139/84 (04/25 0352) SpO2:  [97 %-99 %] 99 % (04/25 0352)   Physical Exam:  General: Laying in bed comfortably with mitts on, NAD Mouth: patient has poor dentition with severe xerostomia Cardiovascular: RRR, no murmur appreciated Respiratory: Normal work of breathing on room air.  Clear to auscultation bilaterally. Abdomen: Soft, nontender, nondistended, + bowel sounds  Extremities:  Bilateral lower extremities without swelling or erythema. Neuro: alert but sleepy. Speech dysarthric   Laboratory: Recent Labs  Lab 02/17/19 0352 02/17/19 1555 02/18/19 0320 02/19/19 0737  WBC 13.9*  --  12.0* 12.7*  HGB 6.0* 7.7* 7.6* 6.2*  HCT 18.1* 21.9* 21.9* 17.7*  PLT 256  --  242 243   Recent Labs  Lab 02/17/19 0352 02/18/19 0320 02/19/19 0343  NA 144 148* 146*  K 3.9 3.5 3.3*  CL 113* 114* 114*  CO2 19* 21* 23  BUN 55* 39* 25*  CREATININE 2.01* 1.21 0.99  CALCIUM 8.2* 8.4* 8.2*  GLUCOSE 106* 119* 113*   Iron/TIBC/Ferritin/ %Sat    Component Value Date/Time   IRON 51 02/11/2019 0342   TIBC 230 (L) 02/11/2019 0342   FERRITIN 327 02/11/2019 1350   IRONPCTSAT 22 02/11/2019 0342    Imaging/Diagnostic Tests: No results found.  Richarda Osmond, DO 02/19/2019, 7:20 AM PGY-1, [place] Intern pager: [phone], text pages welcome

## 2019-02-19 NOTE — Progress Notes (Signed)
CRITICAL VALUE ALERT  Critical Value: HGB 6.2  Date & Time Notied:  02/19/19 9:00am  Provider Notified: Yes  Orders Received/Actions taken: Admin 1 Unit RBC

## 2019-02-20 DIAGNOSIS — R710 Precipitous drop in hematocrit: Secondary | ICD-10-CM

## 2019-02-20 DIAGNOSIS — D62 Acute posthemorrhagic anemia: Secondary | ICD-10-CM

## 2019-02-20 DIAGNOSIS — R195 Other fecal abnormalities: Secondary | ICD-10-CM

## 2019-02-20 DIAGNOSIS — G2571 Drug induced akathisia: Secondary | ICD-10-CM

## 2019-02-20 LAB — BPAM RBC
Blood Product Expiration Date: 202004292359
Blood Product Expiration Date: 202005012359
ISSUE DATE / TIME: 202004231037
ISSUE DATE / TIME: 202004250939
Unit Type and Rh: 1700
Unit Type and Rh: 1700

## 2019-02-20 LAB — CBC WITH DIFFERENTIAL/PLATELET
Abs Immature Granulocytes: 0.07 10*3/uL (ref 0.00–0.07)
Basophils Absolute: 0 10*3/uL (ref 0.0–0.1)
Basophils Relative: 0 %
Eosinophils Absolute: 0.1 10*3/uL (ref 0.0–0.5)
Eosinophils Relative: 1 %
HCT: 24.4 % — ABNORMAL LOW (ref 39.0–52.0)
Hemoglobin: 8.4 g/dL — ABNORMAL LOW (ref 13.0–17.0)
Immature Granulocytes: 1 %
Lymphocytes Relative: 12 %
Lymphs Abs: 1.7 10*3/uL (ref 0.7–4.0)
MCH: 31.8 pg (ref 26.0–34.0)
MCHC: 34.4 g/dL (ref 30.0–36.0)
MCV: 92.4 fL (ref 80.0–100.0)
Monocytes Absolute: 1 10*3/uL (ref 0.1–1.0)
Monocytes Relative: 7 %
Neutro Abs: 11 10*3/uL — ABNORMAL HIGH (ref 1.7–7.7)
Neutrophils Relative %: 79 %
Platelets: 271 10*3/uL (ref 150–400)
RBC: 2.64 MIL/uL — ABNORMAL LOW (ref 4.22–5.81)
RDW: 14.4 % (ref 11.5–15.5)
WBC: 13.9 10*3/uL — ABNORMAL HIGH (ref 4.0–10.5)
nRBC: 0.2 % (ref 0.0–0.2)

## 2019-02-20 LAB — GLUCOSE, CAPILLARY
Glucose-Capillary: 92 mg/dL (ref 70–99)
Glucose-Capillary: 105 mg/dL — ABNORMAL HIGH (ref 70–99)
Glucose-Capillary: 93 mg/dL (ref 70–99)
Glucose-Capillary: 91 mg/dL (ref 70–99)

## 2019-02-20 LAB — BASIC METABOLIC PANEL
Anion gap: 11 (ref 5–15)
BUN: 16 mg/dL (ref 6–20)
CO2: 20 mmol/L — ABNORMAL LOW (ref 22–32)
Calcium: 8 mg/dL — ABNORMAL LOW (ref 8.9–10.3)
Chloride: 111 mmol/L (ref 98–111)
Creatinine, Ser: 0.83 mg/dL (ref 0.61–1.24)
GFR calc Af Amer: >60 mL/min (ref >60)
GFR calc non Af Amer: >60 mL/min (ref >60)
Glucose, Bld: 105 mg/dL — ABNORMAL HIGH (ref 70–99)
Potassium: 3.4 mmol/L — ABNORMAL LOW (ref 3.5–5.1)
Sodium: 142 mmol/L (ref 135–145)

## 2019-02-20 LAB — TYPE AND SCREEN
ABO/RH(D): AB NEG
Antibody Screen: NEGATIVE
Unit division: 0
Unit division: 0

## 2019-02-20 LAB — CBC
HCT: 21.3 % — ABNORMAL LOW (ref 39.0–52.0)
Hemoglobin: 7.3 g/dL — ABNORMAL LOW (ref 13.0–17.0)
MCH: 31.5 pg (ref 26.0–34.0)
MCHC: 34.3 g/dL (ref 30.0–36.0)
MCV: 91.8 fL (ref 80.0–100.0)
Platelets: 256 10*3/uL (ref 150–400)
RBC: 2.32 MIL/uL — ABNORMAL LOW (ref 4.22–5.81)
RDW: 14.3 % (ref 11.5–15.5)
WBC: 13.5 10*3/uL — ABNORMAL HIGH (ref 4.0–10.5)
nRBC: 0.3 % — ABNORMAL HIGH (ref 0.0–0.2)

## 2019-02-20 LAB — OCCULT BLOOD X 1 CARD TO LAB, STOOL: Fecal Occult Bld: POSITIVE — AB

## 2019-02-20 MED ORDER — LORAZEPAM 0.5 MG PO TABS
0.2500 mg | ORAL_TABLET | Freq: Three times a day (TID) | ORAL | Status: DC
  Administered 2019-02-20 (×3): 0.25 mg via ORAL
  Filled 2019-02-20 (×2): qty 1

## 2019-02-20 MED ORDER — POTASSIUM CHLORIDE CRYS ER 20 MEQ PO TBCR
40.0000 meq | EXTENDED_RELEASE_TABLET | Freq: Two times a day (BID) | ORAL | Status: AC
  Administered 2019-02-20 (×2): 40 meq via ORAL
  Filled 2019-02-20 (×2): qty 2

## 2019-02-20 NOTE — Progress Notes (Signed)
Family Medicine Teaching Service Daily Progress Note Intern Pager: [phone]  Patient name: Patrick Daniel Medical record number: [id number] Date of birth: [birth year] Age: 63 y.o. Gender: male  Primary Care Provider: Marco Collie, MD Consultants: neuro, ID, orthopedics (s/o) Code Status: FULL  Pt Overview and Major Events to Date:  4/15 admitted having been found down, AMS, neuro consulted, LP suggestive of HSV encephalitis, acyclovir started, 1/2 Blood cx positive for Coag neg Staph, CSF cx NGTD, CSF fungal NGTD, Anaerobic cx NGTD 4/16 Cefepime discontinued  4/17 Repeat blood cultures  4/18 Vanc Discontinued, ID added linezolid 4/20 acute renal failure, nephro consulted, pt unable to keep NG down 4/25 1 unit pRBC transfusion  Antimicrobials: Vancomycin 4/15-4/18 Cefepime 4/15-4/16 Acyclovir 4/15- Linezolid 4/18-4/21  Assessment and Plan: Patrick Daniel is a 63 y.o. male who presented with AMS after being found down. PMH is significant for cervical dystonia, dysarthria, HLD, gait abnormality, h/o myelopathy.   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 ate 30-40% of meals yesterday. WBC continues to rise to 13.5 today. Will reach out to ID for recs today. - f/u ID recs - continue IV acyclovir x3 weeks (4/16-), possibly transition to PO today - Tylenol PRN for fever/pain - AM CBC, BMP   Methicillin Resistant CoNS Bacteremia. Patient remains afebrile but WBC count increasing to 13.5 and was neutrophil dominant. Repeating a differential today. It appears that patient had a fever 100.4 yesterday afternoon and has since resolved without medical intervention. s/p 7d tx with Vancomycin (4/15-4/18) and Linezolid (4/18-4/21) with repeat Bcx negative.  - f/u ID recs - monitor temp - CBC am  AKI 2/2 ATN, resolved. Cr 0.87>>peak 4.1> now improved to 0.83 this am and GFR >60. - f/u nephro recs - continue 1/2 NS at 137mL/hr  - BMP am - continue  flomax  Urinary retention- foley in place. Will plan on voiding trial on Monday. About 1.6L urine output yesterday - strict I/O - voiding trial Monday    Hypernatremia- Improved from peak 157 now to 142. Patient continues to be on hypertonic fluids. Plan to stop them at the time he has a normal value to monitor his ability to maintain on his own. Nephrology last note 4/23. - f/u nephro recs - 111mL/hr half NS x48 hours and then trial off - BMP am  Hypokalemia- potassium 3.4 today. Will replete - Kdur 19meq x2 today  - BMP am  Anemia, unstable-  Patient Hgb 6.2 yesterday morning and received 1 unit pRBC which improved to 8.4. today, his Hgb dropped again to 7.3. FOBT positive today. No colonoscopy in chart. Spoke to PoA sister for [place] discussion given frequent drops in Hgb with positive FOBT, she would like to move forward with diagnosis and full treatment. She consented to endoscopy to find source of bleeding. Discussed possibility of cancer and the treatment for removal. She still wants to go ahead. I will consult GI today. - consult GI - Daily CBC  Constipation:  - dulcolax prn  HTN- stable 131/60 this am - continue amlodipine 10mg  daily   L humeral neck fracture- not current surgical candidate. Surgical consult recommended to reconsult if patient verbal and able to consent/request intervention, otherwise, f/u OP. No signs of hematoma presently. reconsult ortho on Monday. - PRN tylenol, monitor for pain - inspect daily for signs of infection/bleeding  FEN/GI: dysphagia 1 with nectar thick liquids , protonix 40mg  IV Prophylaxis: SCDs, no pharm DVT ppx d/t blood in ventricles on  MRI brain  Disposition: patient's AKI and hypernatremia have resolved. barriers to medically stable discharge are maintaining sodium level without hypotonic fluids and maintaining hgb, and treatment for HSV encephalopathy. recommended SNF.    Subjective:  Complains of left arm/shoulder  pain  Objective: Temp:  [97.6 F (36.4 C)-100.4 F (38 C)] 98.5 F (36.9 C) (04/26 0340) Pulse Rate:  [80-118] 91 (04/26 0340) Resp:  [16-18] 18 (04/26 0340) BP: (118-139)/(60-86) 131/60 (04/26 0340) SpO2:  [92 %-99 %] 98 % (04/26 0340) Weight:  [65.1 kg] 65.1 kg (04/26 0340)   Physical Exam:  General: Laying in bed comfortably with mitts on, NAD Mouth: patient has poor dentition with severe xerostomia Cardiovascular: RRR, no murmur appreciated Respiratory: Normal work of breathing on room air.  Clear to auscultation bilaterally. Abdomen: Soft, nontender, nondistended, + bowel sounds  Extremities:  Bilateral lower extremities without swelling or erythema. Neuro: alert but sleepy. Speech dysarthric   Laboratory: Recent Labs  Lab 02/18/19 0320 02/19/19 0737 02/19/19 1510 02/20/19 0448  WBC 12.0* 12.7*  --  13.5*  HGB 7.6* 6.2* 8.4* 7.3*  HCT 21.9* 17.7* 24.5* 21.3*  PLT 242 243  --  256   Recent Labs  Lab 02/18/19 0320 02/19/19 0343 02/20/19 0448  NA 148* 146* 142  K 3.5 3.3* 3.4*  CL 114* 114* 111  CO2 21* 23 20*  BUN 39* 25* 16  CREATININE 1.21 0.99 0.83  CALCIUM 8.4* 8.2* 8.0*  GLUCOSE 119* 113* 105*   Iron/TIBC/Ferritin/ %Sat    Component Value Date/Time   IRON 51 02/11/2019 0342   TIBC 230 (L) 02/11/2019 0342   FERRITIN 327 02/11/2019 1350   IRONPCTSAT 22 02/11/2019 0342    Imaging/Diagnostic Tests: No results found.  Richarda Osmond, DO 02/20/2019, 7:44 AM PGY-1, [place] Intern pager: [phone], text pages welcome

## 2019-02-20 NOTE — Consult Note (Addendum)
[facility] Consult: 1:02 PM 02/20/2019  LOS: 11 days    Referring Provider: Dr Ouida Sills and [place]  Primary Care Physician:  Marco Collie, MD in [place]. Primary Gastroenterologist: unassigned    Reason for Consultation:  Transfusion requiring anemia and FOBT +   HPI: Patrick Daniel is a 63 y.o. male.  Resident at independent living facility.  Hx of cervical dystonia, dysarthria, gait abnormality.  ? of degenerative CNS disorder? No previous upper endoscopy or colonoscopy.  Admitted 11 days ago with altered mental status/wound down at his assisted living, rhabdomyolysis, fracture left humeral neck, flank hematoma.  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  1 of 2 blood cultures grew coag negative staph but no bacterial/fungal, anaerobic growth on the CSF.   Orthopedics saw patient and no surgery planned but consider total arthroplasty in future. Renal following for AKI.  With Hgb 9.8 at admission, 7.7 within 24 hours.  Nadir 6.0 on 4/23.  Used PRBC on 4/23 and 4/25, 1 unit on each occasion.  CV normal.  Platelets normal. Stool this morning was greenish, dark.  It tested FOBT positive.  He has not been receiving any DVT prophylaxis such as Heparin or Lovenox.   CT of the abdomen pelvis without contrast 02/09/2019.  Showed mild left flank contusion, large amount of stool in the rectum without bowel obstruction.  Cholelithiasis.  Aortic atherosclerosis.  Subcentimeter, low-density liver lesion seen on a previous study partially obscured. Attending medical staff has spoken with the patient's sister, there is no record of previous colonoscopy, endoscopy or anemia.  The sister consented verbally to the resident to any necessary GI procedures. Modified barium swallow study #2 on 4/22  (first was on 4/19): Severe oral and moderate severe pharyngeal dysphagia, sensory based and likely exacerbated by acute issues.  Severe aspiration risk.  Recommended nectar thick liquid diet, and he degrees upright post feeding  Past Medical History:  Diagnosis Date  . Dysarthria   . Dystonia   . Gait disturbance   . Myelopathy ([place])   . Paraplegia [facility])     Past Surgical History:  Procedure Laterality Date  . NECK SURGERY     dec 2014    Prior to Admission medications   Medication Sig Start Date End Date Taking? Authorizing Provider  clonazePAM (KLONOPIN) 0.5 MG tablet TAKE 1 TO 2 TABLETS BY MOUTH AT NIGHT AS NEEDED Patient taking differently: Take 0.25-0.5 mg by mouth at bedtime. sleep 01/17/19  Yes Marcial Pacas, MD  doxycycline (PERIOSTAT) 20 MG tablet Take 20 mg by mouth daily.   Yes [provider]  DULoxetine (CYMBALTA) 60 MG capsule TAKE 1 CAPSULE(60 MG) BY MOUTH DAILY Patient taking differently: Take 60 mg by mouth daily.  12/10/18  Yes Marcial Pacas, MD  mometasone (ELOCON) 0.1 % cream Apply 1 application topically daily as needed (DRY SKIN).     [provider]    Scheduled Meds: . sodium chloride   Intravenous Once  . amLODipine  10 mg Oral Daily  . chlorhexidine  15 mL Mouth Rinse BID  .  LORazepam  0.25 mg Oral TID  . mouth rinse  15 mL Mouth Rinse q12n4p  . pantoprazole  40 mg Oral Daily  . polyethylene glycol  17 g Oral Daily  . potassium chloride  40 mEq Oral BID  . tamsulosin  0.8 mg Oral Daily   Infusions: . acyclovir 650 mg (02/19/19 2243)   PRN Meds: acetaminophen, bisacodyl, Resource ThickenUp Clear   Allergies as of 02/09/2019 - Review Complete 02/09/2019  Allergen Reaction Noted  . Baclofen  08/28/2015    Family History  Problem Relation Age of Onset  . Heart disease Mother   . Cancer Mother   . Heart Problems Father   . High Cholesterol Sister   . High blood pressure Sister   . Diabetes Sister   . High blood pressure Sister      Social History   Socioeconomic History  . Marital status: Single    Spouse name: Not on file  . Number of children: 1  . Years of education: Not on file  . Highest education level: Not on file  Occupational History  . Occupation: retired    Comment: Biomedical scientist  . Financial resource strain: Not on file  . Food insecurity:    Worry: Not on file    Inability: Not on file  . Transportation needs:    Medical: Not on file    Non-medical: Not on file  Tobacco Use  . Smoking status: Former Smoker    Types: Cigarettes    Last attempt to quit: 02/01/2013    Years since quitting: 6.0  . Smokeless tobacco: Never Used  . Tobacco comment: quit   Substance and Sexual Activity  . Alcohol use: No  . Drug use: No  . Sexual activity: Not on file  Lifestyle  . Physical activity:    Days per week: Not on file    Minutes per session: Not on file  . Stress: Not on file  Relationships  . Social connections:    Talks on phone: Not on file    Gets together: Not on file    Attends religious service: Not on file    Active member of club or organization: Not on file    Attends meetings of clubs or organizations: Not on file    Relationship status: Not on file  . Intimate partner violence:    Fear of current or ex partner: Not on file    Emotionally abused: Not on file    Physically abused: Not on file    Forced sexual activity: Not on file  Other Topics Concern  . Not on file  Social History Narrative   Patient lives in independent living [place]. Patient is retired from [employer]. Patient has one child. Right handed. Patient is divorced.   Caffeine- 12 - Coffee daily - one daily -soda    REVIEW OF SYSTEMS: He has significant dysarthria and I am unable to get much of her review of systems.  He denies abdominal pain, nausea vomiting. Is not complaining of shoulder pain. Staff placed safety mittens on the patient because he keeps pulling at lines and medical devices.    PHYSICAL EXAM: Vital signs in last 24 hours: Vitals:   02/20/19 0822 02/20/19 1142  BP: (!) 155/86 (!) 161/82  Pulse: 91 92  Resp: 18 20  Temp: 98.1 F (36.7 C) 98 F (36.7 C)  SpO2: 97% 98%   Wt Readings from Last 3 Encounters:  02/20/19 65.1 kg  12/01/18 68.9 kg  09/01/18 63.3 kg   General: Thin, non-acutely ill appearing WM. Head: No facial asymmetry or swelling.  No signs of head trauma. Eyes: No scleral icterus, no conjunctival pallor.  EOMI. Ears: Not HOH Nose: No congestion or discharge. Mouth: No blood in the mouth.  Xerostomia, poor dentition. Neck: No JVD, no masses, no thyromegaly. Lungs: Clear bilaterally.  No labored breathing. Heart: RRR.  No MRG.  S1, S2 present. Abdomen: Soft.  Thin.  Active bowel sounds.  No HSM, masses, bruits, hernias..   Rectal: Dark, formed stool.  FOBT positive.  No palpable lesions, no hemorrhoids. Musc/Skeltl: Extensive bruising on the upper left arm and left flank Extremities: No CCE. Neurologic: Dysarthric.  Follows commands.  Moves all 4 limbs Skin: Hematomas as above per musculoskeletal.  No open sores.  Healed sacral decubitus. Nodes: No cervical adenopathy. Psych: Cooperative.  Intake/Output from previous day: 04/25 0701 - 04/26 0700 In: 2668.4 [I.V.:2661.9; IV Piggyback:6.5] Out: 1625 [Urine:1625] Intake/Output this shift: Total I/O In: -  Out: 1000 [Urine:1000]  LAB RESULTS: Recent Labs    02/19/19 0737 02/19/19 1510 02/20/19 0448 02/20/19 0903  WBC 12.7*  --  13.5* 13.9*  HGB 6.2* 8.4* 7.3* 8.4*  HCT 17.7* 24.5* 21.3* 24.4*  PLT 243  --  256 271   BMET Lab Results  Component Value Date   NA 142 02/20/2019   NA 146 (H) 02/19/2019   NA 148 (H) 02/18/2019   K 3.4 (L) 02/20/2019   K 3.3 (L) 02/19/2019   K 3.5 02/18/2019   CL 111 02/20/2019   CL 114 (H) 02/19/2019   CL 114 (H) 02/18/2019   CO2 20 (L) 02/20/2019   CO2 23 02/19/2019   CO2 21 (L) 02/18/2019   GLUCOSE 105 (H) 02/20/2019   GLUCOSE 113 (H)  02/19/2019   GLUCOSE 119 (H) 02/18/2019   BUN 16 02/20/2019   BUN 25 (H) 02/19/2019   BUN 39 (H) 02/18/2019   CREATININE 0.83 02/20/2019   CREATININE 0.99 02/19/2019   CREATININE 1.21 02/18/2019   CALCIUM 8.0 (L) 02/20/2019   CALCIUM 8.2 (L) 02/19/2019   CALCIUM 8.4 (L) 02/18/2019   PT/INR Lab Results  Component Value Date   INR 1.0 02/09/2019     IMPRESSION:   *   Anemia, FOBT + dark stool but no overt GI bleeding.  S/p PRBC x 2.    Besides GI losses, the significant posttraumatic hematoma observed on the left shoulder and flank are certainly contributing to anemia.  *    HSV encephalitis.    *   Left humeral neck fracture, nonsurgical management for now.  *   Rhabdo. AKI resolved.       PLAN:     *    Agree with decision to add Protonix 40 mg a day, this started today. Dr Carlean Purl will see pt.   Given his overall status, aggressive GI interventions ought to be avoided.  Could consider EGD. Continue nectar thick diet as per SLP recommendation.   Azucena Freed  02/20/2019, 1:02 PM Phone [phone]    [facility] Attending   I have taken an interval history, reviewed the chart and examined the patient. I agree with the Advanced Practitioner's note, impression and recommendations.   Anemia of chronic disease rhabdomyolisis and AKI hematomas Heme + stool Numerous other problems that make endoscopic evaluation inappropriate  While I understand the concern about the decreased Hgb and heme + stool - it does not make sense  to put him thorough sedated procedures. We need to see how he comes out of the current situation and even if he recovers well not sure I would pursue endoscopic evaluation.  Heme + stool in this setting is not actually that helpful as many possible reasons and poor correlation with sig GI pathology.  I really think his anemia can be explained by his anemia of chronic disease (Care everywhere shows this PTA) and his acute illnesses notably  hematomas and AKI which is frequuently associated with a decline in Hgb.  Please contact me if any additional ? But unless we see real signs of major GI bleeding that are a threat to his life would not attempt any procedures.  Gatha Mayer, MD, [facility] 02/20/2019 3:25 PM Pager [phone]

## 2019-02-21 ENCOUNTER — Encounter (HOSPITAL_COMMUNITY): Payer: Self-pay | Admitting: Family Medicine

## 2019-02-21 DIAGNOSIS — G249 Dystonia, unspecified: Secondary | ICD-10-CM

## 2019-02-21 DIAGNOSIS — G319 Degenerative disease of nervous system, unspecified: Secondary | ICD-10-CM

## 2019-02-21 DIAGNOSIS — G243 Spasmodic torticollis: Secondary | ICD-10-CM

## 2019-02-21 DIAGNOSIS — R339 Retention of urine, unspecified: Secondary | ICD-10-CM

## 2019-02-21 LAB — GLUCOSE, CAPILLARY
Glucose-Capillary: 88 mg/dL (ref 70–99)
Glucose-Capillary: 83 mg/dL (ref 70–99)
Glucose-Capillary: 107 mg/dL — ABNORMAL HIGH (ref 70–99)
Glucose-Capillary: 100 mg/dL — ABNORMAL HIGH (ref 70–99)
Glucose-Capillary: 89 mg/dL (ref 70–99)

## 2019-02-21 LAB — BASIC METABOLIC PANEL
Anion gap: 13 (ref 5–15)
BUN: 16 mg/dL (ref 6–20)
CO2: 18 mmol/L — ABNORMAL LOW (ref 22–32)
Calcium: 8.5 mg/dL — ABNORMAL LOW (ref 8.9–10.3)
Chloride: 109 mmol/L (ref 98–111)
Creatinine, Ser: 0.77 mg/dL (ref 0.61–1.24)
GFR calc Af Amer: >60 mL/min (ref >60)
GFR calc non Af Amer: >60 mL/min (ref >60)
Glucose, Bld: 103 mg/dL — ABNORMAL HIGH (ref 70–99)
Potassium: 4.2 mmol/L (ref 3.5–5.1)
Sodium: 140 mmol/L (ref 135–145)

## 2019-02-21 LAB — CBC
HCT: 24.2 % — ABNORMAL LOW (ref 39.0–52.0)
Hemoglobin: 8.3 g/dL — ABNORMAL LOW (ref 13.0–17.0)
MCH: 31.4 pg (ref 26.0–34.0)
MCHC: 34.3 g/dL (ref 30.0–36.0)
MCV: 91.7 fL (ref 80.0–100.0)
Platelets: 335 10*3/uL (ref 150–400)
RBC: 2.64 MIL/uL — ABNORMAL LOW (ref 4.22–5.81)
RDW: 13.9 % (ref 11.5–15.5)
WBC: 11 10*3/uL — ABNORMAL HIGH (ref 4.0–10.5)
nRBC: 0 % (ref 0.0–0.2)

## 2019-02-21 LAB — RPR: RPR Ser Ql: NONREACTIVE

## 2019-02-21 NOTE — Progress Notes (Signed)
Physical Therapy Treatment Patient Details Name: Patrick Daniel MRN: [id number] DOB: [birth year] Today's Date: 02/21/2019    History of Present Illness 63 y.o. male presenting with AMS after being found down at [place]. PMH is significant for cervical dystonia, dysarthria, HLD, gait abnormality (?CNS degenerative disorder?). Patient unable to provide insight to what caused fall given chronic speech issues that have acutely worsened. Known mobility issues at baseline with frequent falls. Concern for traumatic head injury after fall vs stroke vs seizure. Head CT with temporal lobe edema. MRI with concern for acute or early subacute infarct vs infection like HSV. Pt also found to have comminuted fx of L humeral neck, ortho following.     PT Comments    Pt progressing slowly towards physical therapy goals. Continues to require +2 assist for all aspects of mobility and pt not able to progress to take steps this session. Mitts and restraints on when PT entered, and pt required constant supervision while mitts removed as pt attempting to remove sling and pull on Foley line. SNF remains appropriate.  3  Follow Up Recommendations  SNF;Supervision/Assistance - 24 hour     Equipment Recommendations  Other (comment)(TBD by next venue of care)    Recommendations for Other Services       Precautions / Restrictions Precautions Precautions: Fall;Shoulder;Other (comment) Type of Shoulder Precautions: sling, NWB  Shoulder Interventions: Shoulder sling/immobilizer Precaution Comments: impulsive Required Braces or Orthoses: Sling Restrictions Weight Bearing Restrictions: Yes LUE Weight Bearing: Non weight bearing    Mobility  Bed Mobility Overal bed mobility: Needs Assistance Bed Mobility: Supine to Sit     Supine to sit: Mod assist;+2 for physical assistance     General bed mobility comments: +2 assist for safe transition to EOB. Initiated some movement of LE's but required therapist assist to bring  LE's all the way off EOB.   Transfers Overall transfer level: Needs assistance Equipment used: 2 person hand held assist Transfers: Squat Pivot Transfers     Squat pivot transfers: Max assist;+2 physical assistance     General transfer comment: Pt able to stand but not able to advance LE's for pivotal steps to chair.   Ambulation/Gait             General Gait Details: Unable to progress to ambulation this session.    Stairs             Wheelchair Mobility    Modified Rankin (Stroke Patients Only)       Balance Overall balance assessment: Needs assistance Sitting-balance support: Feet supported Sitting balance-Leahy Scale: Poor Sitting balance - Comments: Mod to max assist required at all times for sitting balance EOB. Heavy R lateral lean Postural control: Right lateral lean   Standing balance-Leahy Scale: Zero Standing balance comment: +2 required                            Cognition Arousal/Alertness: Awake/alert Behavior During Therapy: Impulsive;Agitated Overall Cognitive Status: Impaired/Different from baseline Area of Impairment: Attention;Following commands;Safety/judgement;Awareness;Problem solving                   Current Attention Level: Focused   Following Commands: Follows one step commands inconsistently;Follows one step commands with increased time Safety/Judgement: Decreased awareness of deficits;Decreased awareness of safety Awareness: Intellectual Problem Solving: Difficulty sequencing;Requires verbal cues;Requires tactile cues;Slow processing;Decreased initiation        Exercises Hand Exercises Forearm Supination: AROM;10 reps Forearm Pronation: AROM;10 reps Wrist Flexion:  AROM;Left;5 reps Wrist Extension: AROM;Left;5 reps Wrist Ulnar Deviation: AAROM;5 reps Wrist Radial Deviation: AAROM;5 reps Digit Composite Flexion: AROM;Left;5 reps Composite Extension: AROM;Left;5 reps Digit Composite Abduction: 10  reps;AROM Digit Composite Adduction: 10 reps;AROM Thumb Abduction: 10 reps;AROM Thumb Adduction: 10 reps;AROM    General Comments        Pertinent Vitals/Pain Pain Assessment: Faces Faces Pain Scale: Hurts a little bit Pain Location: Occasional grimacing with mobility Pain Intervention(s): Monitored during session;Repositioned    Home Living                      Prior Function            PT Goals (current goals can now be found in the care plan section) Acute Rehab PT Goals Patient Stated Goal: none stated today, agreeable to working  with therapies PT Goal Formulation: Patient unable to participate in goal setting Time For Goal Achievement: 02/24/19 Potential to Achieve Goals: Good Progress towards PT goals: Progressing toward goals    Frequency    Min 3X/week      PT Plan Current plan remains appropriate    Co-evaluation              AM-PAC PT "6 Clicks" Mobility   Outcome Measure  Help needed turning from your back to your side while in a flat bed without using bedrails?: A Lot Help needed moving from lying on your back to sitting on the side of a flat bed without using bedrails?: A Lot Help needed moving to and from a bed to a chair (including a wheelchair)?: A Lot Help needed standing up from a chair using your arms (e.g., wheelchair or bedside chair)?: A Lot Help needed to walk in hospital room?: Total Help needed climbing 3-5 steps with a railing? : Total 6 Click Score: 10    End of Session Equipment Utilized During Treatment: Gait belt Activity Tolerance: Patient tolerated treatment well Patient left: in chair;with call bell/phone within reach;with chair alarm set;with nursing/sitter in room Nurse Communication: Mobility status PT Visit Diagnosis: Muscle weakness (generalized) (M62.81);History of falling (Z91.81);Difficulty in walking, not elsewhere classified (R26.2);Pain Pain - Right/Left: Left Pain - part of body: Shoulder      Time: 3149-7026 PT Time Calculation (min) (ACUTE ONLY): 27 min  Charges:  $Gait Training: 23-37 mins                     Rolinda Roan, PT, DPT Acute Rehabilitation Services Pager: [phone] Office: [phone]    Thelma Comp 02/21/2019, 1:07 PM

## 2019-02-21 NOTE — Care Management Important Message (Signed)
Important Message  Patient Details  Name: Patrick Daniel MRN: [id number] Date of Birth: [birth year]   Medicare Important Message Given:  Yes    [name] Montine Circle 02/21/2019, 10:57 AM

## 2019-02-21 NOTE — Progress Notes (Signed)
Patient ID: Patrick Daniel, male   DOB: [birth year], 63 y.o.   MRN: [id number]  This NP visited patient at the bedside as a follow up for palliative medicine needs and emotional support.  Patient remains with garbled speech and unable to follow commands.  He appears weaker and more lethargic today.  Again it is difficult to assess his psychiatric and cognitive status not knowing his baseline.  Family repeatedly reinforced the fact that the patient was living independently prior to this hospitalization  I spoke to Dr. Ouida Sills with Family medicine regarding patient's current medical situation and the treatment team's plan for care.  She spoke to the fact that Dr. Arelia Longest is not recommending GI work-up.  I spoke with both his daughter/[name] and sister/[name] and had  detailed conversation regarding current medcial situation. We discussed that the patient is not rebounding as hoped with current medical interventions.  We spoke that his oral intake is not adequate to support nutrition and hydration long-term.  We spoke to the fact that GI is not recommending work-up.  I shared with family my concern that they are faced with the very difficult advanced directive decisions regarding artificial nutrition/hydration support and if focus of care should be on comfort and dignity versus life prolonging. I detailed the difference between an aggressive medical intervention path and a palliative comfort path.  Family members endorse the importance of independence to this patient.  We discussed the possibility that family members could possibly in the near future visit patient so they can evaluate the situation for themselves.  Daughter lives in [place] and is contemplating visit to [place] to see her father.  Plan remains to treat the treatable and hope for improvement.  Ultimately family, both the sister and his daughter, do not believe he would want aggressive heroic measures to prolong life  specifically if he could not regain his independence.  They struggle with "if this is the time"   Questions and concerns addressed.         Total time spent on the unit was 60 minutes  Greater than 50% of the time was spent in counseling and coordination of care  Wadie Lessen NP  Palliative Medicine Team Team Phone # [phone] Pager [phone]

## 2019-02-21 NOTE — Progress Notes (Signed)
Family Medicine Teaching Service Daily Progress Note Intern Pager: [phone]  Patient name: Patrick Daniel Medical record number: [id number] Date of birth: March 31, 1956 Age: 63 y.o. Gender: male  Primary Care Provider: Marco Collie, MD Consultants: neuro, ID, orthopedics (s/o) Code Status: FULL  Pt Overview and Major Events to Date:  4/15 admitted having been found down, AMS, neuro consulted, LP suggestive of HSV encephalitis, acyclovir started, 1/2 Blood cx positive for Coag neg Staph, CSF cx NGTD, CSF fungal NGTD, Anaerobic cx NGTD 4/16 Cefepime discontinued  4/17 Repeat blood cultures  4/18 Vanc Discontinued, ID added linezolid 4/20 acute renal failure, nephro consulted, pt unable to keep NG down 4/25 1 unit pRBC transfusion  Antimicrobials: Vancomycin 4/15-4/18 Cefepime 4/15-4/16 Acyclovir 4/15- Linezolid 4/18-4/21  Assessment and Plan: Patrick Daniel is a 63 y.o. male who presented with AMS after being found down. PMH is significant for cervical dystonia, dysarthria, HLD, gait abnormality, h/o myelopathy.   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 Afebrile. Slightly tachycardic this morning. Patient increased agitation and pulling out IV overnight. Possibly 2/2 ativan that was added yesterday. Will discontinue the ativan and discontinue soft restraints as tolerated. - f/u ID recs - continue IV acyclovir x3 weeks (4/16-), possibly transition to PO today - Tylenol PRN for fever/pain - AM CBC, BMP - d/c ativan   Methicillin Resistant CoNS Bacteremia. s/p 7d tx with Vancomycin (4/15-4/18) and Linezolid (4/18-4/21) with repeat Bcx negative. Skin exam yesterday did not reveal any active lesions. - f/u ID recs - monitor temp - f/u CBC today  AKI 2/2 ATN, resolved. Cr 0.87>>peak 4.1> now improved to 0.77 this am and GFR >60. - f/u nephro recs - continue 1/2 NS at 116mL/hr  - BMP am - continue flomax  Urinary retention- foley removed  today. About 2.3L urine output yesterday. Planning a voiding trial today. - strict I/O - voiding trial  - bladder scans q4hrs   Hypernatremia- Improved from peak 157 now to 140. Patient continued to be on hypotonic fluids 171mL/hr. Will  discontinue today to assess his ability to maintain his Na+ without the hypotonic solution. - f/u nephro recs - discontinued fluids - BMP 1700 today - BMP am  Hypokalemia- potassium 4.2 today s/p repleting. Will continue to monitor - BMP this afternoon and tomorrow am  Anemia, unstable-  hgb 8.3 today. GI consulted after FOBT+ and frequent blood loss. They recommended against any procedures given patient's status. Agreed with protonix - f/u GI recs - Daily CBC  HTN- elevated over past 24 hours 163/89 this am. Also has tachycardia this am. - continue amlodipine 10mg  daily - vitals per floor protocol   L humeral neck fracture-  reconsult ortho today? - PRN tylenol, monitor for pain - inspect daily for signs of infection/bleeding  FEN/GI: dysphagia 1 with nectar thick liquids , protonix 40mg  IV, dulcolax PRN Prophylaxis: SCDs, no pharm DVT ppx d/t blood in ventricles on MRI brain  Disposition: patient's AKI and hypernatremia have resolved. barriers to medically stable discharge are maintaining sodium level without hypotonic fluids, maintaining hgb, voiding trial, and treatment for HSV encephalopathy. recommended SNF.    Subjective:  Patient did not appear agitated this morning. No changes in his speech or status from yesterday. Nurse stated that he was agitated overnight and pulled out his IV and was delirious requiring soft restraints. IV fluids were off. Foley was still in and we spoke about dcing the foley.  Objective: Temp:  [98 F (36.7 C)-98.6  F (37 C)] 98.4 F (36.9 C) (04/27 0309) Pulse Rate:  [91-105] 104 (04/27 0309) Resp:  [16-20] 16 (04/27 0309) BP: (150-163)/(63-89) 163/89 (04/27 0309) SpO2:  [95 %-99 %] 99 % (04/27 0309) Weight:   [57.8 kg] 57.8 kg (04/27 0452)   Physical Exam:  General: Laying in bed comfortably with mitts and soft restraints on, NAD Mouth: patient has poor dentition with severe xerostomia Cardiovascular: RRR, no murmur appreciated Respiratory: Normal work of breathing on room air.  Clear to auscultation bilaterally. Abdomen: Soft, nontender, nondistended, + bowel sounds  Extremities:  Bilateral lower extremities without swelling or erythema. Neuro: alert. Speech dysarthric   Laboratory: Recent Labs  Lab 02/19/19 0737 02/19/19 1510 02/20/19 0448 02/20/19 0903  WBC 12.7*  --  13.5* 13.9*  HGB 6.2* 8.4* 7.3* 8.4*  HCT 17.7* 24.5* 21.3* 24.4*  PLT 243  --  256 271   Recent Labs  Lab 02/19/19 0343 02/20/19 0448 02/21/19 0532  NA 146* 142 140  K 3.3* 3.4* 4.2  CL 114* 111 109  CO2 23 20* 18*  BUN 25* 16 16  CREATININE 0.99 0.83 0.77  CALCIUM 8.2* 8.0* 8.5*  GLUCOSE 113* 105* 103*   Iron/TIBC/Ferritin/ %Sat    Component Value Date/Time   IRON 51 02/11/2019 0342   TIBC 230 (L) 02/11/2019 0342   FERRITIN 327 02/11/2019 1350   IRONPCTSAT 22 02/11/2019 0342    Imaging/Diagnostic Tests: No results found.  Richarda Osmond, DO 02/21/2019, 7:10 AM PGY-1, [place] Intern pager: [phone], text pages welcome

## 2019-02-22 ENCOUNTER — Telehealth: Payer: Self-pay | Admitting: Neurology

## 2019-02-22 LAB — GLUCOSE, CAPILLARY
Glucose-Capillary: 103 mg/dL — ABNORMAL HIGH (ref 70–99)
Glucose-Capillary: 110 mg/dL — ABNORMAL HIGH (ref 70–99)
Glucose-Capillary: 118 mg/dL — ABNORMAL HIGH (ref 70–99)
Glucose-Capillary: 86 mg/dL (ref 70–99)
Glucose-Capillary: 86 mg/dL (ref 70–99)
Glucose-Capillary: 97 mg/dL (ref 70–99)
Glucose-Capillary: 98 mg/dL (ref 70–99)

## 2019-02-22 LAB — BASIC METABOLIC PANEL WITH GFR
Anion gap: 12 (ref 5–15)
BUN: 19 mg/dL (ref 6–20)
CO2: 19 mmol/L — ABNORMAL LOW (ref 22–32)
Calcium: 8.6 mg/dL — ABNORMAL LOW (ref 8.9–10.3)
Chloride: 111 mmol/L (ref 98–111)
Creatinine, Ser: 0.87 mg/dL (ref 0.61–1.24)
GFR calc Af Amer: >60 mL/min (ref >60)
GFR calc non Af Amer: >60 mL/min (ref >60)
Glucose, Bld: 94 mg/dL (ref 70–99)
Potassium: 3.7 mmol/L (ref 3.5–5.1)
Sodium: 142 mmol/L (ref 135–145)

## 2019-02-22 LAB — CBC
HCT: 25.7 % — ABNORMAL LOW (ref 39.0–52.0)
Hemoglobin: 8.7 g/dL — ABNORMAL LOW (ref 13.0–17.0)
MCH: 31.8 pg (ref 26.0–34.0)
MCHC: 33.9 g/dL (ref 30.0–36.0)
MCV: 93.8 fL (ref 80.0–100.0)
Platelets: 442 K/uL — ABNORMAL HIGH (ref 150–400)
RBC: 2.74 MIL/uL — ABNORMAL LOW (ref 4.22–5.81)
RDW: 14.6 % (ref 11.5–15.5)
WBC: 8.6 K/uL (ref 4.0–10.5)
nRBC: 0 % (ref 0.0–0.2)

## 2019-02-22 LAB — NOVEL CORONAVIRUS, NAA (HOSP ORDER, SEND-OUT TO REF LAB; TAT 18-24 HRS): SARS-CoV-2, NAA: NOT DETECTED

## 2019-02-22 MED ORDER — ENSURE ENLIVE PO LIQD
237.0000 mL | Freq: Three times a day (TID) | ORAL | Status: AC
  Administered 2019-02-22 – 2019-02-26 (×13): 237 mL via ORAL
  Filled 2019-02-22 (×3): qty 237

## 2019-02-22 NOTE — Progress Notes (Signed)
Nutrition Follow-up   RD working remotely.  DOCUMENTATION CODES:   Not applicable  INTERVENTION:  Continue Ensure Enlive po TID (thickened to nectar thick consistency), each supplement provides 350 kcal and 20 grams of protein.  Continue Magic cup TID with meals, each supplement provides 290 kcal and 9 grams of protein  NUTRITION DIAGNOSIS:   Inadequate oral intake related to inability to eat as evidenced by NPO status; diet advanced; poor po, ongoing  GOAL:   Patient will meet greater than or equal to 90% of their needs; not met  MONITOR:   PO intake, Supplement acceptance, Labs, Weight trends, I & O's, Skin  REASON FOR ASSESSMENT:   Consult Enteral/tube feeding initiation and management  ASSESSMENT:   63 y.o. male presenting with AMS after being found down. PMH is significant for cervical dystonia, dysarthria, HLD, gait abnormality, h/o myelopathy. 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 COVID pending.  Per Palliative care, pt and family desire no artifical feeding and feeding tube. Goals of care include continuation to treat the treatable. Family in agreement with po feeds with known risk of aspiration. Meal completion has been 30%. Pt currently has Ensure and Magic cup ordered. Will continue with current orders. Labs and medications reviewed.   Diet Order:   Diet Order            DIET - DYS 1 Room service appropriate? Yes; Fluid consistency: Nectar Thick  Diet effective now              EDUCATION NEEDS:   Not appropriate for education at this time  Skin:  Skin Assessment: Reviewed RN Assessment  Last BM:  4/23  Height:   Ht Readings from Last 1 Encounters:  02/11/19 5' 6"  (1.676 m)    Weight:   Wt Readings from Last 1 Encounters:  02/21/19 57.8 kg    Ideal Body Weight:  64.5 kg  BMI:  Body mass index is 20.57 kg/m.  Estimated Nutritional Needs:   Kcal:  3748-2707  Protein:  90-100 grams  Fluid:  1.9 - 2.1 L/day    Corrin Parker, MS,  RD, LDN Pager # [phone] After hours/ weekend pager # [phone]

## 2019-02-22 NOTE — Telephone Encounter (Signed)
Roselee Watts called stating that the pt has been in the hospital for almost two weeks and is needing his injection. Please advise.

## 2019-02-22 NOTE — Progress Notes (Signed)
Occupational Therapy Treatment Patient Details Name: Patrick Daniel MRN: [id number] DOB: [birth year] Today's Date: 02/22/2019    History of present illness 63 y.o. male presenting with AMS after being [facility] down at [facility]. PMH is significant for cervical dystonia, dysarthria, HLD, gait abnormality (?CNS degenerative disorder?). Patient unable to provide insight to what caused fall given chronic speech issues that have acutely worsened. Known mobility issues at baseline with frequent falls. Concern for traumatic head injury after fall vs stroke vs seizure. Head CT with temporal lobe edema. MRI with concern for acute or early subacute infarct vs infection like HSV. Pt also found to have comminuted fx of L humeral neck, ortho following.    OT comments  Pt with slow progress towards goals. Initiates wrist and hand exercises to L UE in supine, but poor recall of precautions with max multimodal cueing throughout session to avoid use of UE (reaching and attempting to weightbear through UE); repositioned sling multiple times.  Patient continues to requires mod-max assist +2 for all aspects of mobility, completed toilet transfer today with +2 assist and pt unable to void on commode-- requires mod-max assist to maintain sitting balance EOB seated EOB but min assist while seated on commode. Will follow, SNF remains appropriate.    Follow Up Recommendations  SNF;Supervision/Assistance - 24 hour    Equipment Recommendations  Other (comment)(TBD at next venue of care)    Recommendations for Other Services      Precautions / Restrictions Precautions Precautions: Fall;Shoulder;Other (comment) Type of Shoulder Precautions: sling, NWB  Shoulder Interventions: Shoulder sling/immobilizer Precaution Comments: impulsive Required Braces or Orthoses: Sling Restrictions Weight Bearing Restrictions: Yes LUE Weight Bearing: Non weight bearing       Mobility Bed Mobility Overal bed mobility: Needs Assistance Bed  Mobility: Supine to Sit     Supine to sit: Mod assist;+2 for physical assistance     General bed mobility comments: +2 assist for safe transition to EOB, pt able to initate some movement of LBs to EOB and trunk support to ascend but requires assist to complete motion   Transfers Overall transfer level: Needs assistance Equipment used: 2 person hand held assist Transfers: Sit to/from [facility] Sit to Stand: Mod assist;+2 physical assistance;Max assist Stand pivot transfers: Mod assist;Max assist;+2 physical assistance;+2 safety/equipment       General transfer comment: pt requires mod-max assist +2 for sit to stand and transfers to BSC/recliner, unable to advance LEs for pivotal steps to chair and requires max multimodal cueing to avoid use of LUE; noted increased decreased assist with transfer to L side compred to R side    Balance Overall balance assessment: Needs assistance Sitting-balance support: Feet supported Sitting balance-Leahy Scale: Poor Sitting balance - Comments: Mod to max assist required at all times for sitting balance EOB. Heavy R lateral and posterior lean  Postural control: Right lateral lean;Posterior lean   Standing balance-Leahy Scale: Zero Standing balance comment: +2 required                           ADL either performed or assessed with clinical judgement   ADL Overall ADL's : Needs assistance/impaired                 Upper Body Dressing : Maximal assistance;Bed level Upper Body Dressing Details (indicate cue type and reason): donning new gown      Toilet Transfer: Moderate assistance;+2 for physical assistance;+2 for safety/equipment;Stand-pivot;Cueing for sequencing;Cueing for safety;BSC Toilet Transfer  Details (indicate cue type and reason): stand pivot to [facility] with mod assist +2 towards L side          Functional mobility during ADLs: Moderate assistance;Maximal assistance;+2 for physical assistance;+2 for  safety/equipment General ADL Comments: continues to be limited by impaired balance, impaired cognition and decreased act tolerance     Vision       Perception     Praxis      Cognition Arousal/Alertness: Awake/alert Behavior During Therapy: Impulsive Overall Cognitive Status: Impaired/Different from baseline Area of Impairment: Attention;Following commands;Safety/judgement;Awareness;Problem solving                   Current Attention Level: Focused   Following Commands: Follows one step commands inconsistently;Follows one step commands with increased time Safety/Judgement: Decreased awareness of deficits;Decreased awareness of safety Awareness: Intellectual Problem Solving: Difficulty sequencing;Requires verbal cues;Requires tactile cues;Slow processing;Decreased initiation General Comments: pt able to follow 1 step commands inconsistently today, poor awareness to deficits with maximal redirection required for safety, and to avoid use of L UE         Exercises     Shoulder Instructions       General Comments pt initiating L hand exercises in supine, hand flexion/extension and wrist flexion/extension     Pertinent Vitals/ Pain       Pain Assessment: Faces Faces Pain Scale: No hurt  Home Living                                          Prior Functioning/Environment              Frequency  Min 3X/week        Progress Toward Goals  OT Goals(current goals can now be found in the care plan section)  Progress towards OT goals: Progressing toward goals  Acute Rehab OT Goals Patient Stated Goal: none stated today, agreeable to working  with therapies  Plan Discharge plan remains appropriate    Co-evaluation    PT/OT/SLP Co-Evaluation/Treatment: Yes Reason for Co-Treatment: Complexity of the patient's impairments (multi-system involvement);For patient/therapist safety;Necessary to address cognition/behavior during functional  activity;To address functional/ADL transfers   OT goals addressed during session: ADL's and self-care      AM-PAC OT "6 Clicks" Daily Activity     Outcome Measure   Help from another person eating meals?: A Lot Help from another person taking care of personal grooming?: A Lot Help from another person toileting, which includes using toliet, bedpan, or urinal?: Total Help from another person bathing (including washing, rinsing, drying)?: A Lot Help from another person to put on and taking off regular upper body clothing?: A Lot Help from another person to put on and taking off regular lower body clothing?: Total 6 Click Score: 10    End of Session Equipment Utilized During Treatment: Gait belt;Other (comment)(sling)  OT Visit Diagnosis: Muscle weakness (generalized) (M62.81);Other symptoms and signs involving cognitive function;History of falling (Z91.81)   Activity Tolerance Patient tolerated treatment well   Patient Left in chair;with call bell/phone within reach;with chair alarm set;with restraints reapplied   Nurse Communication Mobility status        Time: 0350-0938 OT Time Calculation (min): 29 min  Charges: OT General Charges $OT Visit: 1 Visit OT Treatments $Self Care/Home Management : 8-22 mins  Delight Stare, [place] Pager [phone] Office [phone]    Adonis Brook  S Linzi Ohlinger 02/22/2019, 8:25 AM

## 2019-02-22 NOTE — Telephone Encounter (Signed)
Noted, thank you

## 2019-02-22 NOTE — Progress Notes (Signed)
Physical Therapy Treatment Patient Details Name: Patrick Daniel MRN: [id number] DOB: Jul 12, 1956 Today's Date: 02/22/2019    History of Present Illness 63 y.o. male presenting with AMS after being found down at [place]. PMH is significant for cervical dystonia, dysarthria, HLD, gait abnormality (?CNS degenerative disorder?). Patient unable to provide insight to what caused fall given chronic speech issues that have acutely worsened. Known mobility issues at baseline with frequent falls. Concern for traumatic head injury after fall vs stroke vs seizure. Head CT with temporal lobe edema. MRI with concern for acute or early subacute infarct vs infection like HSV. Pt also found to have comminuted fx of L humeral neck, ortho following.     PT Comments    Pt progressing towards physical therapy goals. Was able to perform transfers with +2 assist and continues to have difficulty advancing LE's to take pivotal steps from bed<>chair. Will continue to follow and progress as able per POC.     Follow Up Recommendations  SNF;Supervision/Assistance - 24 hour     Equipment Recommendations  Other (comment)(TBD by next venue of care)    Recommendations for Other Services       Precautions / Restrictions Precautions Precautions: Fall;Shoulder Type of Shoulder Precautions: sling, NWB  Shoulder Interventions: Shoulder sling/immobilizer Precaution Comments: impulsive Required Braces or Orthoses: Sling Restrictions Weight Bearing Restrictions: Yes LUE Weight Bearing: Non weight bearing    Mobility  Bed Mobility Overal bed mobility: Needs Assistance Bed Mobility: Supine to Sit     Supine to sit: Mod assist;+2 for physical assistance     General bed mobility comments: +2 assist for safe transition to EOB, pt able to initate some movement of LBs to EOB and trunk support to ascend but requires assist to complete motion   Transfers Overall transfer level: Needs assistance Equipment used: 2 person hand  held assist Transfers: Sit to/from [facility] Sit to Stand: Mod assist;+2 physical assistance;Max assist Stand pivot transfers: Mod assist;Max assist;+2 physical assistance;+2 safety/equipment       General transfer comment: pt requires mod-max assist +2 for sit to stand and transfers to BSC/recliner, unable to advance LEs for pivotal steps to chair and requires max multimodal cueing to avoid use of LUE; noted increased decreased assist with transfer to L side compred to R side  Ambulation/Gait             General Gait Details: Unable to progress to ambulation this session.    Stairs             Wheelchair Mobility    Modified Rankin (Stroke Patients Only)       Balance Overall balance assessment: Needs assistance Sitting-balance support: Feet supported Sitting balance-Leahy Scale: Poor Sitting balance - Comments: Mod to max assist required at all times for sitting balance EOB. Heavy R lateral and posterior lean  Postural control: Right lateral lean;Posterior lean   Standing balance-Leahy Scale: Zero Standing balance comment: +2 required                            Cognition Arousal/Alertness: Awake/alert Behavior During Therapy: Impulsive Overall Cognitive Status: Impaired/Different from baseline Area of Impairment: Attention;Following commands;Safety/judgement;Awareness;Problem solving                   Current Attention Level: Focused   Following Commands: Follows one step commands inconsistently;Follows one step commands with increased time Safety/Judgement: Decreased awareness of deficits;Decreased awareness of safety Awareness: Intellectual Problem Solving: Difficulty  sequencing;Requires verbal cues;Requires tactile cues;Slow processing;Decreased initiation General Comments: pt able to follow 1 step commands inconsistently today, poor awareness to deficits with maximal redirection required for safety, and to avoid use  of L UE       Exercises      General Comments General comments (skin integrity, edema, etc.): pt initiating L hand exercises in supine, hand flexion/extension and wrist flexion/extension       Pertinent Vitals/Pain Pain Assessment: Faces Faces Pain Scale: No hurt Pain Intervention(s): Monitored during session    Home Living                      Prior Function            PT Goals (current goals can now be found in the care plan section) Acute Rehab PT Goals Patient Stated Goal: Asking to use the bathroom this session PT Goal Formulation: Patient unable to participate in goal setting Time For Goal Achievement: 02/24/19 Potential to Achieve Goals: Good Progress towards PT goals: Progressing toward goals    Frequency    Min 3X/week      PT Plan Current plan remains appropriate    Co-evaluation PT/OT/SLP Co-Evaluation/Treatment: Yes Reason for Co-Treatment: Complexity of the patient's impairments (multi-system involvement);For patient/therapist safety;To address functional/ADL transfers PT goals addressed during session: Mobility/safety with mobility;Balance;Proper use of DME OT goals addressed during session: ADL's and self-care      AM-PAC PT "6 Clicks" Mobility   Outcome Measure  Help needed turning from your back to your side while in a flat bed without using bedrails?: A Lot Help needed moving from lying on your back to sitting on the side of a flat bed without using bedrails?: A Lot Help needed moving to and from a bed to a chair (including a wheelchair)?: A Lot Help needed standing up from a chair using your arms (e.g., wheelchair or bedside chair)?: A Lot Help needed to walk in hospital room?: Total Help needed climbing 3-5 steps with a railing? : Total 6 Click Score: 10    End of Session Equipment Utilized During Treatment: Gait belt Activity Tolerance: Patient tolerated treatment well Patient left: in chair;with call bell/phone within  reach;with chair alarm set;with nursing/sitter in room Nurse Communication: Mobility status PT Visit Diagnosis: Muscle weakness (generalized) (M62.81);History of falling (Z91.81);Difficulty in walking, not elsewhere classified (R26.2);Pain Pain - Right/Left: Left Pain - part of body: Shoulder     Time: 1950-9326 PT Time Calculation (min) (ACUTE ONLY): 29 min  Charges:  $Gait Training: 8-22 mins                     Rolinda Roan, PT, DPT Acute Rehabilitation Services Pager: [phone] Office: [phone]    Thelma Comp 02/22/2019, 10:16 AM

## 2019-02-22 NOTE — Progress Notes (Addendum)
Family Medicine Teaching Service Daily Progress Note Intern Pager: [phone]  Patient name: Patrick Daniel Medical record number: [id number] Date of birth: March 31, 1956 Age: 63 y.o. Gender: male  Primary Care Provider: Marco Collie, MD Consultants: neuro, ID, orthopedics (s/o) Code Status: FULL  Pt Overview and Major Events to Date:  4/15 admitted having been found down, AMS, neuro consulted, LP suggestive of HSV encephalitis, acyclovir started, 1/2 Blood cx positive for Coag neg Staph, CSF cx NGTD, CSF fungal NGTD, Anaerobic cx NGTD 4/16 Cefepime discontinued  4/17 Repeat blood cultures  4/18 Vanc Discontinued, ID added linezolid 4/20 acute renal failure, nephro consulted, pt unable to keep NG down 4/25 1 unit pRBC transfusion  Antimicrobials: Vancomycin 4/15-4/18 Cefepime 4/15-4/16 Acyclovir 4/15- Linezolid 4/18-4/21  Assessment and Plan: Patrick Daniel is a 63 y.o. male who presented with AMS after being found down. PMH is significant for cervical dystonia, dysarthria, HLD, gait abnormality, h/o myelopathy.   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 No report of agiation overnight. Mental status and speech appear unchanged from day to day. - f/u ID recs - continue IV acyclovir x3 weeks (4/16-5/7) - Tylenol PRN for fever/pain - AM CBC, BMP   Methicillin Resistant CoNS Bacteremia. s/p 7d tx with Vancomycin (4/15-4/18) and Linezolid (4/18-4/21) with repeat Bcx negative. Skin exam negative for infectious appearing or open lesions - f/u ID recs - monitor temp - f/u CBC today  AKI 2/2 ATN, resolved. peak 4.1 increased to 0.87 today s/p discontinuing his IV nutrition and GFR >60. - f/u nephro recs - BMP am - continue flomax  Urinary retention- foley removed yesterday. About 1.7L urine output yesterday. Voiding trial yesterday failed. Patient required I/O caths to void throughout the day. Put back foley and urology OP. - strict I/O - voiding  trial  - bladder scans q4hrs - continue flomax 0.8mg  daily   Hypernatremia- patient was discontinued from IV fluids yesterday. His sodium remains in normal range today and increased to 142. - f/u nephro recs - BMP am  Hypokalemia- potassium 3.7 today. Will continue to monitor - BMP this afternoon and tomorrow am  Anemia, unstable-  hgb 8.3 yesterday. Pending for today. GI consulted after FOBT+ and frequent blood loss. They recommended against any procedures given patient's status. Agreed with protonix - f/u GI recs - Daily CBC  HTN-improved. 127/77 this am. Also has tachycardia this am. - continue amlodipine 10mg  daily - vitals per floor protocol   L humeral neck fracture-  Ecchymosis and hematoma stable - PRN tylenol, monitor for pain - inspect daily for signs of infection/bleeding  FEN/GI: dysphagia 1 with nectar thick liquids , protonix 40mg  IV, dulcolax PRN Prophylaxis: SCDs, no pharm DVT ppx d/t blood in ventricles on MRI brain  Disposition: patient's AKI and hypernatremia have resolved. barriers to medically stable discharge are maintaining sodium level without hypotonic fluids, maintaining hgb, voiding trial, and treatment for HSV encephalopathy. recommended SNF. COVID test pending to r/u infection for SNF placement.   Subjective:  Patient was working with PT this morning and got moved to commode to chair. Spoke with nurse who endorses that he has not had any spontaneous voiding since foley was removed.   Objective: Temp:  [97.3 F (36.3 C)-98.6 F (37 C)] 97.9 F (36.6 C) (04/28 0433) Pulse Rate:  [104-113] 113 (04/28 0433) Resp:  [18-20] 18 (04/28 0433) BP: (125-142)/(63-80) 127/77 (04/28 0433) SpO2:  [97 %-100 %] 97 % (04/28 0433)   Physical Exam:  General: Laying in  bed comfortably with mitts and soft restraints on, NAD Mouth: patient has poor dentition with severe xerostomia Cardiovascular: RRR, no murmur appreciated Respiratory: Normal work of breathing on  room air.  Clear to auscultation bilaterally. Abdomen: Soft, nontender, nondistended, + bowel sounds  Extremities:  Bilateral lower extremities without swelling or erythema. Neuro: alert. Speech dysarthric   Laboratory: Recent Labs  Lab 02/20/19 0448 02/20/19 0903 02/21/19 0723  WBC 13.5* 13.9* 11.0*  HGB 7.3* 8.4* 8.3*  HCT 21.3* 24.4* 24.2*  PLT 256 271 335   Recent Labs  Lab 02/20/19 0448 02/21/19 0532 02/22/19 0355  NA 142 140 142  K 3.4* 4.2 3.7  CL 111 109 111  CO2 20* 18* 19*  BUN 16 16 19   CREATININE 0.83 0.77 0.87  CALCIUM 8.0* 8.5* 8.6*  GLUCOSE 105* 103* 94   Iron/TIBC/Ferritin/ %Sat    Component Value Date/Time   IRON 51 02/11/2019 0342   TIBC 230 (L) 02/11/2019 0342   FERRITIN 327 02/11/2019 1350   IRONPCTSAT 22 02/11/2019 0342    Imaging/Diagnostic Tests: No results found.  Richarda Osmond, DO 02/22/2019, 7:25 AM PGY-1, [place] Intern pager: [phone], text pages welcome

## 2019-02-22 NOTE — Progress Notes (Signed)
  Speech Language Pathology Treatment: Dysphagia;Cognitive-Linquistic  Patient Details Name: Patrick Daniel MRN: [id number] DOB: 1955/11/19 Today's Date: 02/22/2019 Time: 8676-7209 SLP Time Calculation (min) (ACUTE ONLY): 35 min  Assessment / Plan / Recommendation Clinical Impression  Unfortunate situation for pt with significant dysphagia marked by copious residue especially with thicker textures such as puree and decreased cricopharyngeal relaxation with reduced upper esophageal opening. At bedside nectar thick appeared to transit pharynx versus thick mashed potatoes and beef on lunch tray. Once magic cup stirred, consistency slightly thinner and appeared to better tolerate. Max cues to produce extra dry swallow and throat clear. Delayed coughs that appeared related to po's with nectar following puree.   Speech strategies reviewed multiple times and pt has difficulty generalizing spontaneously. Exhibits decreased cognitive ability and as previously mentioned per SLP colleague, question his comprehension. With imitation or reminders, intelligibility increases from 20% to 80% (mostly with context known).    HPI HPI: Rayyan Burley is a 63 y.o. male presenting with history of paraplegia, myelopathy, dysarthria, cervical dystonia, (?CNS degenerative disorder )admitted from [facility] with AMS after being found down. 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 This distribution may also be seen in infection, particularly herpes encephalitis, though this seems less likely in this presentation. Sustained  aleft humerus fx, rhabdomyolsis. 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      SLP Plan  Continue with current plan of care       Recommendations  Diet recommendations: Dysphagia 1 (puree);Nectar-thick liquid Liquids provided via: Cup;Straw Medication Administration:  Crushed with puree Supervision: Staff to assist with self feeding;Full supervision/cueing for compensatory strategies Compensations: Minimize environmental distractions;Slow rate;Small sips/bites;Lingual sweep for clearance of pocketing;Multiple dry swallows after each bite/sip Postural Changes and/or Swallow Maneuvers: Seated upright 90 degrees;Upright 30-60 min after meal                Oral Care Recommendations: Oral care BID Follow up Recommendations: Skilled Nursing facility SLP Visit Diagnosis: Dysarthria and anarthria (R47.1);Dysphagia, unspecified (R13.10) Plan: Continue with current plan of care       [place], Crystalyn Delia Willis 02/22/2019, 2:01 PM  [name] Colvin Caroli.Ed Risk analyst [phone] Office [phone]

## 2019-02-22 NOTE — Progress Notes (Signed)
Patient ID: Patrick Daniel, male   DOB: [birth year], 63 y.o.   MRN: [id number]  This NP visited patient at the bedside as a follow up for palliative medicine needs and emotional support.  Patient is more alert today and according to nursing is tolerating 35% of his meals.   Again it is difficult to assess his psychiatric and cognitive status not knowing his baseline.  Family repeatedly reinforced the fact that the patient was living independently prior to this hospitalization   I spoke with  [name] and then face timed with the patient.    Continued conversation regarding current medcial situation.  We discussed that the patient is not rebounding as hoped with current medical interventions.  We spoke that his oral intake is not adequate to support nutrition and hydration long-term.  He is high risk for decompensation.   I detailed the difference between an aggressive medical intervention path and a palliative comfort path.  Daughter may be coming to town on Thursday in hopes of supporting patient in person.   Plan remains to treat the treatable and hope for improvement.  Ultimately family, both the sister and his daughter, do not believe he would want aggressive heroic measures to prolong life specifically if he could not regain his independence.  They struggle remains is  "if this is the time"   Questions and concerns addressed.         Total time spent on the unit was 35   minutes  Greater than 50% of the time was spent in counseling and coordination of care Discussed with [place] NP  Palliative Medicine Team Team Phone # [phone] Pager [phone]

## 2019-02-22 NOTE — Telephone Encounter (Signed)
I returned the call to his sister, Brayton Layman (on [place]).  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   He also has a pending COVID-19 test.  Additionally, says she was told he may have colon cancer but is unable to undergo a colonoscopy until he is more stable.   His appt for Xeomin injections has been canceled. She will call our office back at a later time to reschedule.

## 2019-02-23 LAB — BASIC METABOLIC PANEL
Anion gap: 10 (ref 5–15)
BUN: 18 mg/dL (ref 6–20)
CO2: 23 mmol/L (ref 22–32)
Calcium: 8.8 mg/dL — ABNORMAL LOW (ref 8.9–10.3)
Chloride: 111 mmol/L (ref 98–111)
Creatinine, Ser: 0.64 mg/dL (ref 0.61–1.24)
GFR calc Af Amer: >60 mL/min (ref >60)
GFR calc non Af Amer: >60 mL/min (ref >60)
Glucose, Bld: 108 mg/dL — ABNORMAL HIGH (ref 70–99)
Potassium: 3.7 mmol/L (ref 3.5–5.1)
Sodium: 144 mmol/L (ref 135–145)

## 2019-02-23 LAB — GLUCOSE, CAPILLARY
Glucose-Capillary: 95 mg/dL (ref 70–99)
Glucose-Capillary: 89 mg/dL (ref 70–99)
Glucose-Capillary: 162 mg/dL — ABNORMAL HIGH (ref 70–99)
Glucose-Capillary: 111 mg/dL — ABNORMAL HIGH (ref 70–99)
Glucose-Capillary: 127 mg/dL — ABNORMAL HIGH (ref 70–99)
Glucose-Capillary: 114 mg/dL — ABNORMAL HIGH (ref 70–99)

## 2019-02-23 LAB — CBC
HCT: 25.7 % — ABNORMAL LOW (ref 39.0–52.0)
Hemoglobin: 8.6 g/dL — ABNORMAL LOW (ref 13.0–17.0)
MCH: 31.6 pg (ref 26.0–34.0)
MCHC: 33.5 g/dL (ref 30.0–36.0)
MCV: 94.5 fL (ref 80.0–100.0)
Platelets: 441 10*3/uL — ABNORMAL HIGH (ref 150–400)
RBC: 2.72 MIL/uL — ABNORMAL LOW (ref 4.22–5.81)
RDW: 14.8 % (ref 11.5–15.5)
WBC: 9.8 10*3/uL (ref 4.0–10.5)
nRBC: 0 % (ref 0.0–0.2)

## 2019-02-23 NOTE — Progress Notes (Signed)
Physical Therapy Treatment Patient Details Name: Patrick Daniel MRN: [id number] DOB: June 02, 1956 Today's Date: 02/23/2019    History of Present Illness 63 y.o. male presenting with AMS after being found down at [place]. PMH is significant for cervical dystonia, dysarthria, HLD, gait abnormality (?CNS degenerative disorder?). Patient unable to provide insight to what caused fall given chronic speech issues that have acutely worsened. Known mobility issues at baseline with frequent falls. Concern for traumatic head injury after fall vs stroke vs seizure. Head CT with temporal lobe edema. MRI with concern for acute or early subacute infarct vs infection like HSV. Pt also found to have comminuted fx of L humeral neck, ortho following.     PT Comments    Pt progressing towards physical therapy goals. Stedy utilized today to focus on transfer training, balance training, and pre-gait activity. He was able to tolerate 5 sit<>stands and appeared fatigued at end of session. Will continue to follow and progress as able per POC.     Follow Up Recommendations  SNF;Supervision/Assistance - 24 hour     Equipment Recommendations  Other (comment)(TBD by next venue of care)    Recommendations for Other Services       Precautions / Restrictions Precautions Precautions: Fall;Shoulder Type of Shoulder Precautions: sling, NWB  Shoulder Interventions: Shoulder sling/immobilizer Precaution Comments: impulsive Required Braces or Orthoses: Sling Restrictions Weight Bearing Restrictions: Yes LUE Weight Bearing: Non weight bearing    Mobility  Bed Mobility Overal bed mobility: Needs Assistance Bed Mobility: Supine to Sit     Supine to sit: Mod assist;HOB elevated     General bed mobility comments: Pt initiating LE's off EOB. Mod assist required for trunk elevation to full sitting position.   Transfers Overall transfer level: Needs assistance Equipment used: 2 person hand held assist Transfers: Sit  to/from Stand Sit to Stand: Mod assist;+2 physical assistance         General transfer comment: [name] utilized for sit<>stand practice and pre-gait activity in standing. Pt was able to complete x5 sit<>stands with +2 assist for power-up, and +2 assist in standing for safety and management of LUE as wanting to grab the [place] with the hand.   Ambulation/Gait             General Gait Details: Pt able to complete weight shifts in standing with Stedy and +2 assist for support. Pt completed x10 weight shifts in first attempt, and x5 weight shifts in second attempt.    Stairs             Wheelchair Mobility    Modified Rankin (Stroke Patients Only)       Balance Overall balance assessment: Needs assistance Sitting-balance support: Feet supported Sitting balance-Leahy Scale: Poor Sitting balance - Comments: Mod assist required at all times for sitting balance EOB. Heavy R lateral and posterior lean  Postural control: Right lateral lean;Posterior lean   Standing balance-Leahy Scale: Zero Standing balance comment: +2 required                            Cognition Arousal/Alertness: Awake/alert Behavior During Therapy: Impulsive Overall Cognitive Status: Impaired/Different from baseline Area of Impairment: Attention;Following commands;Safety/judgement;Awareness;Problem solving                   Current Attention Level: Focused   Following Commands: Follows one step commands inconsistently;Follows one step commands with increased time Safety/Judgement: Decreased awareness of deficits;Decreased awareness of safety Awareness: Intellectual Problem Solving: Difficulty  sequencing;Requires verbal cues;Requires tactile cues;Slow processing;Decreased initiation General Comments: pt able to follow 1 step commands inconsistently today, poor awareness to deficits with maximal redirection required for safety, and to avoid use of L UE       Exercises Hand  Exercises Wrist Flexion: AROM;Left;5 reps Wrist Extension: AROM;Left;5 reps Wrist Ulnar Deviation: AAROM;5 reps Wrist Radial Deviation: AAROM;5 reps Digit Composite Flexion: AROM;Left;5 reps Composite Extension: AROM;Left;5 reps    General Comments        Pertinent Vitals/Pain Pain Assessment: Faces Faces Pain Scale: Hurts a little bit Pain Location: Occasional grimacing with mobility Pain Intervention(s): Monitored during session;Repositioned    Home Living                      Prior Function            PT Goals (current goals can now be found in the care plan section) Acute Rehab PT Goals PT Goal Formulation: Patient unable to participate in goal setting Time For Goal Achievement: 02/24/19 Potential to Achieve Goals: Good Progress towards PT goals: Progressing toward goals    Frequency    Min 3X/week      PT Plan Current plan remains appropriate    Co-evaluation              AM-PAC PT "6 Clicks" Mobility   Outcome Measure  Help needed turning from your back to your side while in a flat bed without using bedrails?: A Lot Help needed moving from lying on your back to sitting on the side of a flat bed without using bedrails?: A Lot Help needed moving to and from a bed to a chair (including a wheelchair)?: A Lot Help needed standing up from a chair using your arms (e.g., wheelchair or bedside chair)?: A Lot Help needed to walk in hospital room?: Total Help needed climbing 3-5 steps with a railing? : Total 6 Click Score: 10    End of Session Equipment Utilized During Treatment: Gait belt Activity Tolerance: Patient tolerated treatment well Patient left: in chair;with call bell/phone within reach;with chair alarm set;with nursing/sitter in room Nurse Communication: Mobility status PT Visit Diagnosis: Muscle weakness (generalized) (M62.81);History of falling (Z91.81);Difficulty in walking, not elsewhere classified (R26.2);Pain Pain - Right/Left:  Left Pain - part of body: Shoulder     Time: 7106-2694 PT Time Calculation (min) (ACUTE ONLY): 33 min  Charges:  $Therapeutic Activity: 23-37 mins                     Rolinda Roan, PT, DPT Acute Rehabilitation Services Pager: [phone] Office: [phone]    Thelma Comp 02/23/2019, 11:23 AM

## 2019-02-23 NOTE — Progress Notes (Addendum)
Family Medicine Teaching Service Daily Progress Note Intern Pager: [phone]  Patient name: Patrick Daniel Medical record number: [id number] Date of birth: Jan 17, 1956 Age: 63 y.o. Gender: male  Primary Care Provider: Marco Collie, MD Consultants: neuro, ID, orthopedics (s/o) Code Status: FULL  Pt Overview and Major Events to Date:  4/15 admitted having been found down, AMS, neuro consulted, LP suggestive of HSV encephalitis, acyclovir started, 1/2 Blood cx positive for Coag neg Staph, CSF cx NGTD, CSF fungal NGTD, Anaerobic cx NGTD 4/16 Cefepime discontinued  4/17 Repeat blood cultures  4/18 Vanc Discontinued, ID added linezolid 4/20 acute renal failure, nephro consulted, pt unable to keep NG down 4/25 1 unit pRBC transfusion  Antimicrobials: Vancomycin 4/15-4/18 Cefepime 4/15-4/16 Acyclovir 4/15- Linezolid 4/18-4/21  Assessment and Plan: Patrick Daniel is a 63 y.o. male who presented with AMS after being found down. PMH is significant for cervical dystonia, dysarthria, HLD, gait abnormality, h/o myelopathy.   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 No report of agiation overnight. Mental status and speech appear unchanged from day to day. His IV was infiltrated and delayed his morning dose of acyclovir today. Requested a new IV be placed for med. Continue magic cup and ensures BID. - f/u ID recs - continue IV acyclovir x3 weeks (4/16-5/7) - Tylenol PRN for fever/pain - AM CBC, BMP   Methicillin Resistant CoNS Bacteremia. s/p 7d tx with Vancomycin (4/15-4/18) and Linezolid (4/18-4/21) with repeat Bcx negative. WBC 9.8 today - f/u ID recs - monitor temp - f/u CBC daily  AKI 2/2 ATN, resolved. peak 4.1 improved to 0.64 today. - f/u nephro recs - BMP am - continue flomax  Urinary retention- ~1.2L urine output yesterday. Voiding trial x2 failed. Patient required I/O caths to void throughout the day. Put back foley and urology OP. -  strict I/O - voiding trial  - bladder scans q4hrs - discontinue flomax 0.8mg - restart prior to discharge   Hypernatremia- His sodium remains in normal range today and increased to 144. - f/u nephro recs - BMP am  Hypokalemia- stable- potassium 3.7 today. Will continue to monitor - BMP am  Anemia, stable-  hgb 8.6 today. - Daily CBC  HTN-improved. 126/70 this am. - continue amlodipine 10mg  daily - vitals per floor protocol   L humeral neck fracture-  Ecchymosis and hematoma stable - PRN tylenol, monitor for pain - inspect daily for signs of infection/bleeding  FEN/GI: dysphagia 1 with nectar thick liquids , protonix 40mg  IV, dulcolax PRN.  Prophylaxis: SCDs, no pharm DVT ppx d/t blood in ventricles on MRI brain  Disposition: patient's AKI and hypernatremia have resolved. barriers to medically stable discharge are maintaining sodium level without hypotonic fluids, and completing treatment for HSV encephalopathy. recommended SNF. COVID test negative to r/u infection for SNF placement.   Subjective:  Patient resting comfortably in bed. status unchanged from yesterday. Has foley in place. No IV access. Nurse stated the IV infiltrated this morning and he was not given his morning meds  Objective: Temp:  [97.8 F (36.6 C)-98.3 F (36.8 C)] 98 F (36.7 C) (04/29 0332) Pulse Rate:  [98-108] 108 (04/29 0332) Resp:  [15-20] 20 (04/29 0332) BP: (98-145)/(67-96) 126/70 (04/29 0332) SpO2:  [97 %-100 %] 99 % (04/29 0332) Weight:  [55.9 kg] 55.9 kg (04/29 0332)   Physical Exam:  General: Laying in bed comfortably with mitts, NAD Mouth: patient has poor dentition with severe xerostomia Cardiovascular: RRR, no murmur appreciated Respiratory: Normal work of breathing on  room air.  Clear to auscultation bilaterally. Abdomen: Soft, nontender, nondistended, + bowel sounds  Extremities:  Bilateral lower extremities without swelling or erythema. Foley in place Derm: ecchymosis diffusely  along left shoulder, arm, flank to hip Neuro: alert. Speech dysarthric   Laboratory: Recent Labs  Lab 02/21/19 0723 02/22/19 0846 02/23/19 0738  WBC 11.0* 8.6 9.8  HGB 8.3* 8.7* 8.6*  HCT 24.2* 25.7* 25.7*  PLT 335 442* 441*   Recent Labs  Lab 02/21/19 0532 02/22/19 0355 02/23/19 0738  NA 140 142 144  K 4.2 3.7 3.7  CL 109 111 111  CO2 18* 19* 23  BUN 16 19 18   CREATININE 0.77 0.87 0.64  CALCIUM 8.5* 8.6* 8.8*  GLUCOSE 103* 94 108*   Iron/TIBC/Ferritin/ %Sat    Component Value Date/Time   IRON 51 02/11/2019 0342   TIBC 230 (L) 02/11/2019 0342   FERRITIN 327 02/11/2019 1350   IRONPCTSAT 22 02/11/2019 0342    Imaging/Diagnostic Tests: No results found.  Richarda Osmond, DO 02/23/2019, 7:32 AM PGY-1, [place] Intern pager: [phone], text pages welcome

## 2019-02-23 NOTE — Progress Notes (Signed)
Patient ID: Patrick Daniel, male   DOB: [birth year], 63 y.o.   MRN: [id number]  This NP visited patient at the bedside as a follow up for palliative medicine needs and emotional support.  Patient is more alert today and according to nursing is tolerating 35% of his meals.   Again it is difficult to assess his psychiatric and cognitive status not knowing his baseline.  Family repeatedly reinforced the fact that the patient was living independently prior to this hospitalization   I spoke with  [name] and then face timed with the patient.    Continued conversation regarding current medical situation. 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  We spoke that his oral intake is not adequate to support nutrition and hydration long-term.  He is high risk for decompensation.   I detailed the difference between an aggressive medical intervention path and a palliative comfort path.  Daughter is coming to [facility] today and hopes to see patient tomorrow.   Plan remains to treat the treatable and hope for improvement.    Ultimately family, both the sister and his daughter, do not believe he would want aggressive heroic measures to prolong life specifically if he could not regain his independence.  They struggle remains is  "if this is the time"   Questions and concerns addressed.         Total time spent on the unit was 45   minutes  Greater than 50% of the time was spent in counseling and coordination of care Discussed with [place] NP  Palliative Medicine Team Team Phone # [phone] Pager [phone]

## 2019-02-24 LAB — GLUCOSE, CAPILLARY
Glucose-Capillary: 102 mg/dL — ABNORMAL HIGH (ref 70–99)
Glucose-Capillary: 121 mg/dL — ABNORMAL HIGH (ref 70–99)
Glucose-Capillary: 113 mg/dL — ABNORMAL HIGH (ref 70–99)
Glucose-Capillary: 128 mg/dL — ABNORMAL HIGH (ref 70–99)
Glucose-Capillary: 109 mg/dL — ABNORMAL HIGH (ref 70–99)

## 2019-02-24 LAB — BASIC METABOLIC PANEL
Anion gap: 10 (ref 5–15)
BUN: 21 mg/dL — ABNORMAL HIGH (ref 6–20)
CO2: 24 mmol/L (ref 22–32)
Calcium: 8.8 mg/dL — ABNORMAL LOW (ref 8.9–10.3)
Chloride: 112 mmol/L — ABNORMAL HIGH (ref 98–111)
Creatinine, Ser: 0.7 mg/dL (ref 0.61–1.24)
GFR calc Af Amer: >60 mL/min (ref >60)
GFR calc non Af Amer: >60 mL/min (ref >60)
Glucose, Bld: 112 mg/dL — ABNORMAL HIGH (ref 70–99)
Potassium: 3.3 mmol/L — ABNORMAL LOW (ref 3.5–5.1)
Sodium: 146 mmol/L — ABNORMAL HIGH (ref 135–145)

## 2019-02-24 MED ORDER — POTASSIUM CHLORIDE 10 MEQ/100ML IV SOLN
10.0000 meq | INTRAVENOUS | Status: AC
  Administered 2019-02-24 (×2): 10 meq via INTRAVENOUS
  Filled 2019-02-24 (×2): qty 100

## 2019-02-24 NOTE — Care Management Important Message (Signed)
Important Message  Patient Details  Name: Patrick Daniel MRN: [id number] Date of Birth: 1956-04-17   Medicare Important Message Given:  Yes    [name] 02/24/2019, 1:41 PM

## 2019-02-24 NOTE — Progress Notes (Signed)
Patient ID: Patrick Daniel, male   DOB: 12-24-1955, 63 y.o.   MRN: [id number]  This NP visited patient at the bedside as a follow up for palliative medicine needs and emotional support.  Patient is alert today and according to nursing continues  tolerating 35% of his meals.   Again it is difficult to assess his psychiatric and cognitive status not knowing his baseline.  Family repeatedly reinforced the fact that the patient was living independently prior to this hospitalization  His daughter Patrick Daniel has arrived to [place], I met with her and had continued   conversation regarding current complicated medical situation.  Detailed for the family the multiple comorbidities and his high risk for decompensation. 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  We spoke that his oral intake is not adequate to support nutrition and hydration long-term.  He is high risk for decompensation.   I detailed the difference between an aggressive medical intervention path and a palliative comfort path.   Inroduced a MOST and left a Hard Choices for review  I spoke to Dr Cecille Rubin and requested he visit with family.    Plan remains to treat the treatable and hope for improvement, will meet with family on Monday to further define GOCs.     Questions and concerns addressed.         Total time spent on the unit was 75 minutes   minutes  Greater than 50% of the time was spent in counseling and coordination of care Discussed with Family Medicine/ Dr Marrianne Mood NP  Palliative Medicine Team Team Phone # [phone] Pager [phone]

## 2019-02-24 NOTE — Progress Notes (Signed)
Family Medicine Teaching Service Daily Progress Note Intern Pager: [phone]  Patient name: Patrick Daniel Medical record number: [id number] Date of birth: 11/11/1955 Age: 63 y.o. Gender: male  Primary Care Provider: Marco Collie, MD Consultants: neuro, ID, orthopedics (s/o) Code Status: FULL  Pt Overview and Major Events to Date:  4/15 admitted having been found down, AMS, neuro consulted, LP suggestive of HSV encephalitis, acyclovir started, 1/2 Blood cx positive for Coag neg Staph, CSF cx NGTD, CSF fungal NGTD, Anaerobic cx NGTD 4/16 Cefepime discontinued  4/17 Repeat blood cultures  4/18 Vanc Discontinued, ID added linezolid 4/20 acute renal failure, nephro consulted, pt unable to keep NG down 4/25 1 unit pRBC transfusion  Antimicrobials: Vancomycin 4/15-4/18 Cefepime 4/15-4/16  Acyclovir 4/15- Linezolid 4/18-4/21  Assessment and Plan: Patrick Daniel is a 63 y.o. 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 PMH is significant for cervical dystonia, dysarthria, HLD, gait abnormality, h/o myelopathy.   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  Vitals stable overnight.  We will continue him with IV acyclovir through the completion of his course.  Plan to discharge to SNF following completion of acyclovir course. - f/u ID recs - continue IV acyclovir x3 weeks (4/16-5/7) - Tylenol PRN for fever/pain  AKI 2/2 ATN, resolved. peak 4.1 improved to baseline.  Creatinine 0.7 this a.m. (4/30). - f/u nephro recs - BMP am - continue flomax  Methicillin Resistant CoNS Bacteremia-resolved s/p 7d tx with Vancomycin (4/15-4/18) and Linezolid (4/18-4/21) with repeat Bcx negative.  Urinary retention- ~1.2L urine output yesterday. Voiding trial x2 failed. Patient required I/O caths to void throughout the day. Put back foley and urology OP. - strict I/O - discontinue flomax 0.8mg - restart prior to discharge   Hypernatremia Sodium 146 this morning (4/30).  750  mL's urine output on 4/29. - f/u nephro recs - BMP am  Hypokalemia Potassium 3.3 this a.m. (4/30). -Replete PRN - BMP am  Anemia, stable-  hgb 8.6 today. - Daily CBC  HTN-improved.  Well controlled in the past 24 hours. - continue amlodipine 10mg  daily - vitals per floor protocol   L humeral neck fracture-  Ecchymosis and hematoma stable - PRN tylenol, monitor for pain - inspect daily for signs of infection/bleeding  FEN/GI: dysphagia 1 with nectar thick liquids , protonix 40mg  IV, dulcolax PRN.  Prophylaxis: SCDs, no pharm DVT ppx d/t blood in ventricles on MRI brain  Disposition: Plan to continue monitoring inpatient for the duration of his acyclovir course which will finish on 5/7.   Subjective:  Patrick Daniel was awake and working with physical therapy during our interview this morning.  He was clearly alert and interactive though is very difficult to understand his speech.  He was able to deny specifically shortness of breath, chest pain, nausea/vomiting, new discomfort.  Objective: Temp:  [97.8 F (36.6 C)-99.1 F (37.3 C)] 98 F (36.7 C) (04/30 0310) Pulse Rate:  [96-116] 101 (04/30 0310) Resp:  [16-20] 20 (04/30 0310) BP: (119-153)/(61-76) 119/76 (04/30 0310) SpO2:  [98 %-99 %] 99 % (04/30 0310) Weight:  [56 kg] 56 kg (04/30 0310)   General: Alert and cooperative and appears to be in no acute distress.  Working with physical therapy at the sink in his room.  Made significant effort to communicate that is very difficult to understand his speech.  He provided the appearance of understanding her conversation.  Left arm sling. Cardio: Normal S1 and S2, no S3 or S4. Rhythm is regular. No murmurs or rubs.  Pulm: Clear to auscultation bilaterally, no crackles, wheezing, or diminished breath sounds. Normal respiratory effort Abdomen: Bowel sounds normal. Abdomen soft and non-tender.  Extremities: No peripheral edema. Warm/ well perfused.  Strong radial pulse. Neuro: Cranial  nerves grossly intact   Laboratory: Recent Labs  Lab 02/21/19 0723 02/22/19 0846 02/23/19 0738  WBC 11.0* 8.6 9.8  HGB 8.3* 8.7* 8.6*  HCT 24.2* 25.7* 25.7*  PLT 335 442* 441*   Recent Labs  Lab 02/22/19 0355 02/23/19 0738 02/24/19 0418  NA 142 144 146*  K 3.7 3.7 3.3*  CL 111 111 112*  CO2 19* 23 24  BUN 19 18 21*  CREATININE 0.87 0.64 0.70  CALCIUM 8.6* 8.8* 8.8*  GLUCOSE 94 108* 112*   Iron/TIBC/Ferritin/ %Sat    Component Value Date/Time   IRON 51 02/11/2019 0342   TIBC 230 (L) 02/11/2019 0342   FERRITIN 327 02/11/2019 1350   IRONPCTSAT 22 02/11/2019 0342    Imaging/Diagnostic Tests: No results found.  Matilde Haymaker, MD 02/24/2019, 6:13 AM PGY-1, [place] Intern pager: [phone], text pages welcome

## 2019-02-24 NOTE — Progress Notes (Addendum)
Occupational Therapy Treatment Patient Details Name: Patrick Daniel MRN: [id number] DOB: February 19, 1956 Today's Date: 02/24/2019    History of present illness 63 y.o. male presenting with AMS after being [facility] at [facility]. PMH is significant for cervical dystonia, dysarthria, HLD, gait abnormality (?CNS degenerative disorder?). Patient unable to provide insight to what caused fall given chronic speech issues that have acutely worsened. Known mobility issues at baseline with frequent falls. Concern for traumatic head injury after fall vs stroke vs seizure. Head CT with temporal lobe edema. MRI with concern for acute or early subacute infarct vs infection like HSV. Pt also found to have comminuted fx of L humeral neck, ortho following.    OT comments  Pt progressing towards OT goals presents supine in bed pleasant and willing to engage in therapy session. Pt performing sit<>stand with use of Stedy this session, requiring modA+2 for safe transfer completion. Seated on Stedy pt positioned in front of sink to perform grooming ADL, completing with overall min-modA throughout and additional assist for sitting balance (min-modA for balance). Pt requiring frequent cues not to use LUE during mobility tasks. Feel SNF recommendation remains appropriate at this time. Will continue to follow acutely.   Follow Up Recommendations  SNF;Supervision/Assistance - 24 hour    Equipment Recommendations  Other (comment)(TBA)          Precautions / Restrictions Precautions Precautions: Fall;Shoulder Type of Shoulder Precautions: sling, NWB  Shoulder Interventions: Shoulder sling/immobilizer Precaution Comments: impulsive Required Braces or Orthoses: Sling Restrictions Weight Bearing Restrictions: Yes LUE Weight Bearing: Non weight bearing       Mobility Bed Mobility Overal bed mobility: Needs Assistance Bed Mobility: Supine to Sit     Supine to sit: Mod assist;HOB elevated     General bed mobility  comments: Increased initiation of movement today. Pt was able to transition to EOB with light mod assist, mainly for scooting out fully to EOB.   Transfers Overall transfer level: Needs assistance Equipment used: 2 person hand held assist Transfers: Sit to/from Stand Sit to Stand: Mod assist;+2 physical assistance         General transfer comment: [name] utilized for sit<>stand practice and to promote sitting balance at sink for ADL tasks.     Balance Overall balance assessment: Needs assistance Sitting-balance support: Feet supported Sitting balance-Leahy Scale: Poor Sitting balance - Comments: Mod assist required at all times for sitting balance EOB. Heavy R lateral and posterior lean. Hands-on required at gait belt and at chest for truncal support.  Postural control: Right lateral lean;Posterior lean   Standing balance-Leahy Scale: Zero Standing balance comment: +2 required                            ADL either performed or assessed with clinical judgement   ADL Overall ADL's : Needs assistance/impaired Eating/Feeding: Set up;Minimal assistance;Supervision/ safety Eating/Feeding Details (indicate cue type and reason): direct supervision drinking nectar thick water end of session with cues for swallowing Grooming: Wash/dry face;Oral care;Minimal assistance;Moderate assistance;Sitting Grooming Details (indicate cue type and reason): min-modA for sitting balance; seated on Stedy seat pads at sink during task completion; cues to use only RUE to assist with task          Upper Body Dressing : Maximal assistance;Sitting Upper Body Dressing Details (indicate cue type and reason): for sling readjustment                 Functional mobility during ADLs: Moderate assistance;+2  for physical assistance;+2 for safety/equipment(use of Stedy)       Vision       Perception     Praxis      Cognition Arousal/Alertness: Awake/alert Behavior During Therapy:  Impulsive Overall Cognitive Status: Impaired/Different from baseline Area of Impairment: Attention;Following commands;Safety/judgement;Awareness;Problem solving                   Current Attention Level: Focused   Following Commands: Follows one step commands inconsistently;Follows one step commands with increased time Safety/Judgement: Decreased awareness of deficits;Decreased awareness of safety Awareness: Intellectual Problem Solving: Difficulty sequencing;Requires verbal cues;Requires tactile cues;Slow processing;Decreased initiation General Comments: pt able to follow 1 step commands inconsistently today, poor awareness to deficits with maximal redirection required for safety, and to avoid use of L UE         Exercises     Shoulder Instructions       General Comments      Pertinent Vitals/ Pain       Pain Assessment: Faces Faces Pain Scale: No hurt Pain Intervention(s): Monitored during session  Home Living                                          Prior Functioning/Environment              Frequency  Min 3X/week        Progress Toward Goals  OT Goals(current goals can now be found in the care plan section)  Progress towards OT goals: Progressing toward goals  Acute Rehab OT Goals Patient Stated Goal: Pt asking for water this session.  OT Goal Formulation: With patient Time For Goal Achievement: 02/24/19 Potential to Achieve Goals: Good  Plan Discharge plan remains appropriate    Co-evaluation    PT/OT/SLP Co-Evaluation/Treatment: Yes Reason for Co-Treatment: Complexity of the patient's impairments (multi-system involvement);For patient/therapist safety;To address functional/ADL transfers PT goals addressed during session: Mobility/safety with mobility;Balance;Proper use of DME OT goals addressed during session: ADL's and self-care      AM-PAC OT "6 Clicks" Daily Activity     Outcome Measure   Help from another person  eating meals?: A Lot Help from another person taking care of personal grooming?: A Lot Help from another person toileting, which includes using toliet, bedpan, or urinal?: Total Help from another person bathing (including washing, rinsing, drying)?: A Lot Help from another person to put on and taking off regular upper body clothing?: A Lot Help from another person to put on and taking off regular lower body clothing?: Total 6 Click Score: 10    End of Session Equipment Utilized During Treatment: Gait belt(Stedy; sling)  OT Visit Diagnosis: Muscle weakness (generalized) (M62.81);Other symptoms and signs involving cognitive function;History of falling (Z91.81)   Activity Tolerance Patient tolerated treatment well   Patient Left in chair;with call bell/phone within reach;with chair alarm set   Nurse Communication Mobility status        Time: 3149-7026 OT Time Calculation (min): 38 min  Charges: OT General Charges $OT Visit: 1 Visit OT Treatments $Self Care/Home Management : 8-22 mins  Lou Cal, OT Supplemental Rehabilitation Services Pager [phone] Office [phone]    Raymondo Band 02/24/2019, 12:05 PM

## 2019-02-24 NOTE — Progress Notes (Addendum)
Pharmacy Antibiotic Note  Patrick Daniel is a 63 y.o. 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  Pharmacy has been consulted for acyclovir IV dosing.  Patient with resolved AKI (likely multifactorial due to acyclovir IV therapy, vancomycin, dehydration and rhabdo on admit). Scr recovered 3.42>>0.70, estimated CrCl ~77.49ml/min.   Plan: Continue acyclovir 10mg /kg IV q8h Monitor renal function and LOT  Height: 5\' 6"  (167.6 cm) Weight: 123 lb 7.3 oz (56 kg) IBW/kg (Calculated) : 63.8  Temp (24hrs), Avg:98.2 F (36.8 C), Min:97.6 F (36.4 C), Max:98.7 F (37.1 C)  Recent Labs  Lab 02/20/19 0448 02/20/19 0903 02/21/19 0532 02/21/19 0723 02/22/19 0355 02/22/19 0846 02/23/19 0738 02/24/19 0418  WBC 13.5* 13.9*  --  11.0*  --  8.6 9.8  --   CREATININE 0.83  --  0.77  --  0.87  --  0.64 0.70    Estimated Creatinine Clearance: 77.8 mL/min (by C-G formula based on SCr of 0.7 mg/dL).    Allergies  Allergen Reactions  . Baclofen Other (See Comments)    Weakness, drowsiness   Thank you for involving pharmacy in this patient's care.  Janae Bridgeman, PharmD PGY1 Pharmacy Resident Phone: [phone] 02/24/2019 8:11 AM

## 2019-02-24 NOTE — Progress Notes (Signed)
Physical Therapy Treatment Patient Details Name: Patrick Daniel MRN: [id number] DOB: April 04, 1956 Today's Date: 02/24/2019    History of Present Illness 63 y.o. male presenting with AMS after being found down at [facility]. PMH is significant for cervical dystonia, dysarthria, HLD, gait abnormality (?CNS degenerative disorder?). Patient unable to provide insight to what caused fall given chronic speech issues that have acutely worsened. Known mobility issues at baseline with frequent falls. Concern for traumatic head injury after fall vs stroke vs seizure. Head CT with temporal lobe edema. MRI with concern for acute or early subacute infarct vs infection like HSV. Pt also found to have comminuted fx of L humeral neck, ortho following.     PT Comments    Pt progressing towards physical therapy goals. Pt tolerated sitting activity ~20 minutes this session. Stedy utilized to promote sitting balance at sink to wash face and brush teeth. SNF remains the most appropriate d/c disposition. Will continue to follow and progress as able per POC.     Follow Up Recommendations  SNF;Supervision/Assistance - 24 hour     Equipment Recommendations  Other (comment)(TBD by next venue of care)    Recommendations for Other Services       Precautions / Restrictions Precautions Precautions: Fall;Shoulder Type of Shoulder Precautions: sling, NWB  Shoulder Interventions: Shoulder sling/immobilizer Precaution Comments: impulsive Required Braces or Orthoses: Sling Restrictions Weight Bearing Restrictions: Yes LUE Weight Bearing: Non weight bearing    Mobility  Bed Mobility Overal bed mobility: Needs Assistance Bed Mobility: Supine to Sit     Supine to sit: Mod assist;HOB elevated     General bed mobility comments: Increased initiation of movement today. Pt was able to transition to EOB with light mod assist, mainly for scooting out fully to EOB.   Transfers Overall transfer level: Needs assistance Equipment  used: 2 person hand held assist Transfers: Sit to/from Stand Sit to Stand: Mod assist;+2 physical assistance         General transfer comment: [name] utilized for sit<>stand practice and to promote sitting balance at sink for ADL tasks.   Ambulation/Gait             General Gait Details: Not able to progress ambulation this session.    Stairs             Wheelchair Mobility    Modified Rankin (Stroke Patients Only)       Balance Overall balance assessment: Needs assistance Sitting-balance support: Feet supported Sitting balance-Leahy Scale: Poor Sitting balance - Comments: Mod assist required at all times for sitting balance EOB. Heavy R lateral and posterior lean. Hands-on required at gait belt and at chest for truncal support.  Postural control: Right lateral lean;Posterior lean   Standing balance-Leahy Scale: Zero Standing balance comment: +2 required                             Cognition Arousal/Alertness: Awake/alert Behavior During Therapy: Impulsive Overall Cognitive Status: Impaired/Different from baseline Area of Impairment: Attention;Following commands;Safety/judgement;Awareness;Problem solving                   Current Attention Level: Focused   Following Commands: Follows one step commands inconsistently;Follows one step commands with increased time Safety/Judgement: Decreased awareness of deficits;Decreased awareness of safety Awareness: Intellectual Problem Solving: Difficulty sequencing;Requires verbal cues;Requires tactile cues;Slow processing;Decreased initiation General Comments: pt able to follow 1 step commands inconsistently today, poor awareness to deficits with maximal redirection required for safety,  and to avoid use of L UE       Exercises Hand Exercises Wrist Flexion: AROM;Left;5 reps Wrist Extension: AROM;Left;5 reps Wrist Ulnar Deviation: AAROM;5 reps Wrist Radial Deviation: AAROM;5 reps Digit Composite  Flexion: AROM;Left;5 reps Composite Extension: AROM;Left;5 reps    General Comments        Pertinent Vitals/Pain Pain Assessment: Faces Faces Pain Scale: No hurt Pain Location: Occasional grimacing with mobility Pain Intervention(s): Monitored during session    Home Living                      Prior Function            PT Goals (current goals can now be found in the care plan section) Acute Rehab PT Goals Patient Stated Goal: Pt asking for water this session.  PT Goal Formulation: Patient unable to participate in goal setting Time For Goal Achievement: 02/24/19 Potential to Achieve Goals: Good Progress towards PT goals: Progressing toward goals    Frequency    Min 3X/week      PT Plan Current plan remains appropriate    Co-evaluation PT/OT/SLP Co-Evaluation/Treatment: Yes Reason for Co-Treatment: Complexity of the patient's impairments (multi-system involvement);For patient/therapist safety;To address functional/ADL transfers PT goals addressed during session: Mobility/safety with mobility;Balance;Proper use of DME        AM-PAC PT "6 Clicks" Mobility   Outcome Measure  Help needed turning from your back to your side while in a flat bed without using bedrails?: A Lot Help needed moving from lying on your back to sitting on the side of a flat bed without using bedrails?: A Lot Help needed moving to and from a bed to a chair (including a wheelchair)?: A Lot Help needed standing up from a chair using your arms (e.g., wheelchair or bedside chair)?: A Lot Help needed to walk in hospital room?: Total Help needed climbing 3-5 steps with a railing? : Total 6 Click Score: 10    End of Session Equipment Utilized During Treatment: Gait belt Activity Tolerance: Patient tolerated treatment well Patient left: in chair;with call bell/phone within reach;with chair alarm set Nurse Communication: Mobility status PT Visit Diagnosis: Muscle weakness (generalized)  (M62.81);History of falling (Z91.81);Difficulty in walking, not elsewhere classified (R26.2);Pain Pain - Right/Left: Left Pain - part of body: Shoulder     Time: 7989-2119 PT Time Calculation (min) (ACUTE ONLY): 33 min  Charges:  $Gait Training: 8-22 mins                     Rolinda Roan, PT, DPT Acute Rehabilitation Services Pager: [phone] Office: [phone]    Thelma Comp 02/24/2019, 11:44 AM

## 2019-02-25 LAB — CBC
HCT: 24.3 % — ABNORMAL LOW (ref 39.0–52.0)
Hemoglobin: 8.1 g/dL — ABNORMAL LOW (ref 13.0–17.0)
MCH: 32 pg (ref 26.0–34.0)
MCHC: 33.3 g/dL (ref 30.0–36.0)
MCV: 96 fL (ref 80.0–100.0)
Platelets: 493 10*3/uL — ABNORMAL HIGH (ref 150–400)
RBC: 2.53 MIL/uL — ABNORMAL LOW (ref 4.22–5.81)
RDW: 15 % (ref 11.5–15.5)
WBC: 9.4 10*3/uL (ref 4.0–10.5)
nRBC: 0 % (ref 0.0–0.2)

## 2019-02-25 LAB — BASIC METABOLIC PANEL
Anion gap: 10 (ref 5–15)
BUN: 20 mg/dL (ref 6–20)
CO2: 23 mmol/L (ref 22–32)
Calcium: 8.8 mg/dL — ABNORMAL LOW (ref 8.9–10.3)
Chloride: 114 mmol/L — ABNORMAL HIGH (ref 98–111)
Creatinine, Ser: 0.74 mg/dL (ref 0.61–1.24)
GFR calc Af Amer: >60 mL/min (ref >60)
GFR calc non Af Amer: >60 mL/min (ref >60)
Glucose, Bld: 107 mg/dL — ABNORMAL HIGH (ref 70–99)
Potassium: 3.7 mmol/L (ref 3.5–5.1)
Sodium: 147 mmol/L — ABNORMAL HIGH (ref 135–145)

## 2019-02-25 LAB — GLUCOSE, CAPILLARY
Glucose-Capillary: 107 mg/dL — ABNORMAL HIGH (ref 70–99)
Glucose-Capillary: 100 mg/dL — ABNORMAL HIGH (ref 70–99)
Glucose-Capillary: 104 mg/dL — ABNORMAL HIGH (ref 70–99)
Glucose-Capillary: 108 mg/dL — ABNORMAL HIGH (ref 70–99)
Glucose-Capillary: 106 mg/dL — ABNORMAL HIGH (ref 70–99)
Glucose-Capillary: 127 mg/dL — ABNORMAL HIGH (ref 70–99)
Glucose-Capillary: 115 mg/dL — ABNORMAL HIGH (ref 70–99)
Glucose-Capillary: 112 mg/dL — ABNORMAL HIGH (ref 70–99)

## 2019-02-25 NOTE — Progress Notes (Signed)
Physical Therapy Treatment Patient Details Name: Patrick Daniel MRN: [id number] DOB: Sep 05, 1956 Today's Date: 02/25/2019    History of Present Illness 63 y.o. male presenting with AMS after being found down at [place]. PMH is significant for cervical dystonia, dysarthria, HLD, gait abnormality (?CNS degenerative disorder?). Patient unable to provide insight to what caused fall given chronic speech issues that have acutely worsened. Known mobility issues at baseline with frequent falls. Concern for traumatic head injury after fall vs stroke vs seizure. Head CT with temporal lobe edema. MRI with concern for acute or early subacute infarct vs infection like HSV. Pt also found to have comminuted fx of L humeral neck, ortho following.     PT Comments    Pt progressing towards physical therapy goals. Stedy utilized for sit<>stand training, standing balance, and sitting balance. Pt required assist to ensure LUE was not being used and sling was in place throughout. Pt not able to tolerate standing trials quite as long as last session, however demonstrating improved sitting balance on Stedy. Will continue to follow and progress as able per POC. Acute PT goals updated this session.    Follow Up Recommendations  SNF;Supervision/Assistance - 24 hour     Equipment Recommendations  Other (comment)(TBD by next venue of care)    Recommendations for Other Services       Precautions / Restrictions Precautions Precautions: Fall;Shoulder Type of Shoulder Precautions: sling, NWB  Shoulder Interventions: Shoulder sling/immobilizer Precaution Comments: impulsive Required Braces or Orthoses: Sling Restrictions Weight Bearing Restrictions: Yes LUE Weight Bearing: Non weight bearing    Mobility  Bed Mobility Overal bed mobility: Needs Assistance Bed Mobility: Supine to Sit     Supine to sit: Mod assist;HOB elevated     General bed mobility comments: Increased initiation of movement today. Pt was able to  transition to EOB with light mod assist, mainly for scooting out fully to EOB.   Transfers Overall transfer level: Needs assistance Equipment used: 2 person hand held assist Transfers: Sit to/from Stand Sit to Stand: Mod assist;+2 physical assistance         General transfer comment: Patrick Daniel utilized for sit<>stand practice and to promote sitting balance at sink for ADL tasks.   Ambulation/Gait             General Gait Details: Not able to progress ambulation this session.    Stairs             Wheelchair Mobility    Modified Rankin (Stroke Patients Only)       Balance Overall balance assessment: Needs assistance Sitting-balance support: Feet supported Sitting balance-Leahy Scale: Poor Sitting balance - Comments: Mod assist required at all times for sitting balance EOB. Heavy R lateral and posterior lean. Hands-on required at gait belt and at chest for truncal support.  Postural control: Right lateral lean;Posterior lean   Standing balance-Leahy Scale: Zero Standing balance comment: +2 required                             Cognition Arousal/Alertness: Awake/alert Behavior During Therapy: Impulsive Overall Cognitive Status: Impaired/Different from baseline Area of Impairment: Attention;Following commands;Safety/judgement;Awareness;Problem solving                   Current Attention Level: Focused   Following Commands: Follows one step commands inconsistently;Follows one step commands with increased time Safety/Judgement: Decreased awareness of deficits;Decreased awareness of safety Awareness: Intellectual Problem Solving: Difficulty sequencing;Requires verbal cues;Requires tactile cues;Slow processing;Decreased  initiation General Comments: pt able to follow 1 step commands inconsistently today, poor awareness to deficits with maximal redirection required for safety, and to avoid use of L UE       Exercises      General Comments         Pertinent Vitals/Pain Pain Assessment: Faces Faces Pain Scale: No hurt Pain Intervention(s): Monitored during session    Home Living                      Prior Function            PT Goals (current goals can now be found in the care plan section) Acute Rehab PT Goals Patient Stated Goal: None stated this session PT Goal Formulation: Patient unable to participate in goal setting Time For Goal Achievement: 03/10/19 Potential to Achieve Goals: Good Progress towards PT goals: Progressing toward goals    Frequency    Min 3X/week      PT Plan Current plan remains appropriate    Co-evaluation              AM-PAC PT "6 Clicks" Mobility   Outcome Measure  Help needed turning from your back to your side while in a flat bed without using bedrails?: A Lot Help needed moving from lying on your back to sitting on the side of a flat bed without using bedrails?: A Lot Help needed moving to and from a bed to a chair (including a wheelchair)?: A Lot Help needed standing up from a chair using your arms (e.g., wheelchair or bedside chair)?: A Lot Help needed to walk in hospital room?: Total Help needed climbing 3-5 steps with a railing? : Total 6 Click Score: 10    End of Session Equipment Utilized During Treatment: Gait belt Activity Tolerance: Patient tolerated treatment well Patient left: in chair;with call bell/phone within reach;with chair alarm set Nurse Communication: Mobility status PT Visit Diagnosis: Muscle weakness (generalized) (M62.81);History of falling (Z91.81);Difficulty in walking, not elsewhere classified (R26.2);Pain Pain - Right/Left: Left Pain - part of body: Shoulder     Time: 1884-1660 PT Time Calculation (min) (ACUTE ONLY): 27 min  Charges:  $Gait Training: 23-37 mins                     Patrick Daniel, PT, DPT Acute Rehabilitation Services Pager: [phone] Office: [phone]    Patrick Daniel 02/25/2019, 10:16 AM

## 2019-02-25 NOTE — Progress Notes (Deleted)
The original result note has been redacted due to error.

## 2019-02-25 NOTE — Progress Notes (Signed)
  Speech Language Pathology Treatment: Cognitive-Linquistic(Dyarthria)  Patient Details Name: Patrick Daniel MRN: [id number] DOB: 1956/10/09 Today's Date: 02/25/2019 Time: 1202-1218 SLP Time Calculation (min) (ACUTE ONLY): 16 min  Assessment / Plan / Recommendation Clinical Impression  Pt denied any baseline deficts in motor speech but then stated that individuals frequently asked him to repeat himself prior to admission. He was alert and cooperative throughout the session without complaint of pain. He was educated again regarding compensatory strategies for speech intelligibility. However, considering his difficulty simulataneously implementing more than one strategy and to reduce cognitive load, this session only focused on the pt's use of a louder vocal intensity. He used this strategy at the word level with 20% accuracy increasing to 90% accuracy with moderate cues. At the short phrase level he demonstrated 40% accuracy increasing to 100% accuracy with mod-max cues. SLP will continue to follow pt.    HPI HPI: Patrick Daniel is a 63 y.o. male presenting with history of paraplegia, myelopathy, dysarthria, cervical dystonia, (?CNS degenerative disorder )ad[facility] with AMS after being found down. 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 This distribution may also be seen in infection, particularly herpes encephalitis, though this seems less likely in this presentation. Sustained  aleft humerus fx, rhabdomyolsis. 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      SLP Plan  Continue with current plan of care       Recommendations                   Oral Care Recommendations: Oral care BID Follow up Recommendations: Skilled Nursing facility SLP Visit Diagnosis: Dysarthria and anarthria (R47.1) Plan: Continue with current plan of care        Shandra Szymborski I. Hardin Negus, [place], [place] Office number [phone] Pager [phone]                 Horton Marshall 02/25/2019, 12:28 PM

## 2019-02-25 NOTE — Progress Notes (Signed)
Family Medicine Teaching Service Daily Progress Note Intern Pager: [phone]  Patient name: Patrick Daniel Medical record number: [id number] Date of birth: 10-May-1956 Age: 63 y.o. Gender: male  Primary Care Provider: Marco Collie, MD Consultants: neuro, ID, orthopedics (s/o) Code Status: FULL  Pt Overview and Major Events to Date:  4/15 admitted having been found down, AMS, neuro consulted, LP suggestive of HSV encephalitis, acyclovir started, 1/2 Blood cx positive for Coag neg Staph, CSF cx NGTD, CSF fungal NGTD, Anaerobic cx NGTD 4/16 Cefepime discontinued  4/17 Repeat blood cultures  4/18 Vanc Discontinued, ID added linezolid 4/20 acute renal failure, nephro consulted, pt unable to keep NG down 4/25 1 unit pRBC transfusion  Antimicrobials: Vancomycin 4/15-4/18 - done Cefepime 4/15-4/16 - done Linezolid 4/18-4/21 - done Acyclovir 4/15-5/7 - active  Assessment and Plan: Patrick Daniel is a 63 y.o. 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 PMH is significant for cervical dystonia, dysarthria, HLD, gait abnormality, h/o myelopathy.   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  Mildly tachycardic and hypertensive in the past 24 hours.  We will continue to monitor.  Multiple people commented on the improvement in his speech on 4/29.  We will continue to monitor his improvement and consider head imaging if we are dissatisfied with the progression of his dysarthria or concerned about new symptoms. - f/u ID recs - continue IV acyclovir x3 weeks (4/16-5/7) - Tylenol PRN for fever/pain  AKI 2/2 ATN, resolved.  Peak Cr 4.1. Now improved to baseline.  Creatinine 0.74 this a.m. (5/1). - BMP am  Methicillin Resistant CoNS Bacteremia-resolved s/p 7d tx with Vancomycin (4/15-4/18) and Linezolid (4/18-4/21) with repeat Bcx negative.  Urinary retention He has been having ongoing issues related to urinary retention.  Trial of Tamsulosin was attempted without  success.  Now with indwelling catheter and plan to follow-up with urology. - strict I/O - discontinue tamsulosin 0.8mg - restart prior to discharge   Hypernatremia Sodium 147 this morning (5/1).  Will encourage more free water intake. - BMP am  Hypokalemia Potassium 3.7 this a.m. (5/1). -Replete PRN - BMP am  Anemia, stable Hemoglobin 8.1 this morning down from 8.6. - Daily CBC  HTN-improved.  Well controlled in the past 24 hours. - continue amlodipine 10mg  daily - vitals per floor protocol   L humeral neck fracture-  Ecchymosis and hematoma stable - PRN tylenol, monitor for pain - monitor daily  FEN/GI: dysphagia 1 with nectar thick liquids  Prophylaxis: SCDs, no pharm DVT ppx d/t blood in ventricles on MRI brain  Disposition: Plan to continue monitoring inpatient for the duration of his acyclovir course which will finish on 5/7.   Subjective:  Was able to interact with him this morning at bedside.  At times, his speech is very coherent and other times wanes to mumbling.  He did appear to understand our conversation.  He denied my specific prompting regarding headaches, chest pain, stomach pain.  Is difficult to understand him when he was responding about other concerns.  Objective: Temp:  [97.6 F (36.4 C)-98.9 F (37.2 C)] 98.8 F (37.1 C) (05/01 0425) Pulse Rate:  [80-119] 108 (05/01 0425) Resp:  [15-19] 19 (05/01 0425) BP: (82-155)/(71-96) 141/91 (05/01 0425) SpO2:  [97 %-100 %] 99 % (05/01 0425)   General: Alert and cooperative and appears to be in no acute distress.  Mildly improved dysarthria this morning.  Engaged and cooperative. Cardio: Normal S1 and S2, no S3 or S4. Rhythm is regular. No murmurs or rubs.  Pulm: Clear to auscultation bilaterally, no crackles, wheezing, or diminished breath sounds. Normal respiratory effort Abdomen: Bowel sounds normal. Abdomen soft and non-tender.  Extremities: No peripheral edema. Warm/ well perfused.  Strong radial  pulse. Neuro: Cranial nerves grossly intact  Laboratory: Recent Labs  Lab 02/22/19 0846 02/23/19 0738 02/25/19 0327  WBC 8.6 9.8 9.4  HGB 8.7* 8.6* 8.1*  HCT 25.7* 25.7* 24.3*  PLT 442* 441* 493*   Recent Labs  Lab 02/23/19 0738 02/24/19 0418 02/25/19 0327  NA 144 146* 147*  K 3.7 3.3* 3.7  CL 111 112* 114*  CO2 23 24 23   BUN 18 21* 20  CREATININE 0.64 0.70 0.74  CALCIUM 8.8* 8.8* 8.8*  GLUCOSE 108* 112* 107*   Iron/TIBC/Ferritin/ %Sat    Component Value Date/Time   IRON 51 02/11/2019 0342   TIBC 230 (L) 02/11/2019 0342   FERRITIN 327 02/11/2019 1350   IRONPCTSAT 22 02/11/2019 0342    Imaging/Diagnostic Tests: No results found.  Matilde Haymaker, MD 02/25/2019, 5:51 AM PGY-1, [place] Intern pager: [phone], text pages welcome

## 2019-02-26 LAB — GLUCOSE, CAPILLARY
Glucose-Capillary: 103 mg/dL — ABNORMAL HIGH (ref 70–99)
Glucose-Capillary: 103 mg/dL — ABNORMAL HIGH (ref 70–99)
Glucose-Capillary: 115 mg/dL — ABNORMAL HIGH (ref 70–99)
Glucose-Capillary: 124 mg/dL — ABNORMAL HIGH (ref 70–99)
Glucose-Capillary: 92 mg/dL (ref 70–99)
Glucose-Capillary: 94 mg/dL (ref 70–99)

## 2019-02-26 MED ORDER — TAMSULOSIN HCL 0.4 MG PO CAPS
0.4000 mg | ORAL_CAPSULE | Freq: Every day | ORAL | Status: DC
  Administered 2019-02-26 – 2019-02-28 (×3): 0.4 mg via ORAL
  Filled 2019-02-26 (×3): qty 1

## 2019-02-26 NOTE — Progress Notes (Addendum)
Family Medicine Teaching Service Daily Progress Note Intern Pager: [phone]  Patient name: Patrick Daniel Medical record number: [id number] Date of birth: [birth year] Age: 63 y.o. Gender: male  Primary Care Provider: Marco Collie, MD Consultants: Neuro, ID, Orthopedics Code Status: Full  Pt Overview and Major Events to Date:  4/15 admitted having been found down, AMS, neuro consulted, LP suggestive of HSV encephalitis, acyclovir started, 1/2 Blood cx positive for Coag neg Staph, CSF cx NGTD, CSF fungal NGTD, Anaerobic cx NGTD 4/16 Cefepime discontinued  4/17 Repeat blood cultures  4/18 Vanc Discontinued, ID added linezolid 4/20 acute renal failure, nephro consulted, pt unable to keep NG down 4/25 1 unit pRBC transfusion  Antimicrobials: Vancomycin 4/15-4/18 - done Cefepime 4/15-4/16 - done Linezolid 4/18-4/21 - done Acyclovir 4/15-5/7 - active  Assessment and Plan: Mousa Ammonsis a 63 y.o.male being treated for HSV-2 encephalopathy. PMH is significant forcervical dystonia, dysarthria, HLD, gait abnormality, h/o myelopathy.   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 Still with severe dysarthria. Able to form sentences with time. Has made great improvement while admitted. SLP continues to follow for rehab. - tylenol PRN for fever/pain - continue IV acyclovir x3 weeks (4/16-5/7), will need to stay inpatient to complete course - F/U ID and palliative recs - Continue SLP rehab - tylenol prn for fever/pain  Urinary retention 1.250L in past 24hrs (~52cc/hr). He has been having ongoing issues related to urinary retention.  Trial of Tamsulosin was attempted without success.  Now with indwelling catheter and plan to follow-up with urology. - strict I/O - continue flomax 0.4mg  daily - could consider increasing flomax to 0.8 or adding finesteride if urinary output not improved   AKI 2/2 ATN, resolved.  Peak Cr 4.1. Now at baseline.  Creatinine 0.74 in  5/1. - am bmp two times per week, next lab anticipated for 5/4  Methicillin Resistant CoNS Bacteremia-resolved s/p 7d tx with Vancomycin (4/15-4/18) and Linezolid (4/18-4/21) with repeat Bcx negative.  Hypernatremia, stable Sodium 147 in the morning of 5/1.  Will encourage more free water intake. - BMP two times per week   Hypokalemia -resolved Potassium 3.7 in am of 5/1. - Replete PRN - BMP two times per week  Anemia, stable Hemoglobin 8.1 in am of 5/2 - cbc once per week  HTN-improved.  Normotensive, 113/59 while sleeping. Well controlled in the past 24 hours. - continue amlodipine 10mg  daily - vitals per floor protocol   L humeral neck fracture-  Ecchymosis and hematoma stable - PRN tylenol, monitor for pain - monitor daily  FEN/GI:dysphagia 1 with nectar thick liquids  Prophylaxis:SCDs, no pharm DVT ppx d/t blood in ventricles on MRI brain  Disposition: continued inpatient stay for IV acyclovir  Subjective:  Patient states he is doing ok. Denies any pain.   Objective: Temp:  [98.2 F (36.8 C)-99.6 F (37.6 C)] 98.9 F (37.2 C) (05/03 0408) Pulse Rate:  [97-137] 119 (05/03 0408) Resp:  [17-18] 18 (05/03 0408) BP: (110-124)/(59-94) 113/59 (05/03 0408) SpO2:  [95 %-99 %] 97 % (05/03 0408) Weight:  [56.8 kg] 56.8 kg (05/03 0500) Physical Exam: General: awake and alert, laying in bed, mittens in place  Cardiovascular: RRR, no MRG Respiratory: CTAB, to wheezes, rales, or rhonchi Abdomen: soft, non tender, non distended, bowel sounds present  Extremities: no edema, non tender  Laboratory: Recent Labs  Lab 02/23/19 0738 02/25/19 0327 02/27/19 0439  WBC 9.8 9.4 10.1  HGB 8.6* 8.1* 8.8*  HCT 25.7* 24.3* 26.6*  PLT 441* 493*  476*   Recent Labs  Lab 02/24/19 0418 02/25/19 0327 02/27/19 0439  NA 146* 147* 145  K 3.3* 3.7 3.8  CL 112* 114* 109  CO2 24 23 24   BUN 21* 20 25*  CREATININE 0.70 0.74 0.84  CALCIUM 8.8* 8.8* 9.2  GLUCOSE 112* 107*  120*   CBG (last 3)  Recent Labs    02/26/19 1930 02/27/19 0031 02/27/19 0327  GLUCAP 124* 112* 127*     Imaging/Diagnostic Tests: Ct Abdomen Pelvis Wo Contrast  Result Date: 02/09/2019 CLINICAL DATA:  Fall. Found on floor. Left flank hematoma. EXAM: CT ABDOMEN AND PELVIS WITHOUT CONTRAST TECHNIQUE: Multidetector CT imaging of the abdomen and pelvis was performed following the standard protocol without IV contrast. COMPARISON:  12/04/2018 FINDINGS: Lower chest: Motion artifact through the lung bases. No lung consolidation or pleural effusion. Hepatobiliary: Subcentimeter low-density liver lesions on the prior study are partially obscured by artifact from the patient's arms today. Small gallstones are again seen. No biliary dilatation is evident. Pancreas: Unremarkable. Spleen: Unremarkable. Adrenals/Urinary Tract: Unremarkable adrenal glands. Unchanged 1.9 cm low-density right lower pole renal lesion compatible with a cyst on the prior study. No renal calculi or hydronephrosis. Apparent mild bladder wall thickening is attributed to underdistention. Stomach/Bowel: The stomach is unremarkable. A large amount of stool is present in the rectum which is distended to 8 cm in diameter. There is a moderate amount of stool in the sigmoid colon. There is no evidence of bowel obstruction. The appendix is unremarkable. Vascular/Lymphatic: Abdominal aortic atherosclerosis without aneurysm. No enlarged lymph nodes. Reproductive: Unremarkable prostate. Other: No ascites or pneumoperitoneum. No retroperitoneal hematoma. Mild stranding in the left lateral flank subcutaneous fat consistent with contusion. Musculoskeletal: No acute osseous abnormality or suspicious osseous lesion. IMPRESSION: 1. Mild left flank superficial soft tissue contusion. 2. No other evidence of acute traumatic injury in the abdomen or pelvis. 3. Large amount of stool in the rectum.  No bowel obstruction. 4. Cholelithiasis. 5.  Aortic  Atherosclerosis (ICD10-I70.0). Electronically Signed   By: Logan Bores M.D.   On: 02/09/2019 17:11   Dg Chest 1 View  Result Date: 02/09/2019 CLINICAL DATA:  Found on floor today with left upper extremity bruising. EXAM: CHEST  1 VIEW COMPARISON:  None. FINDINGS: Lungs are adequately inflated and otherwise clear. Cardiomediastinal silhouette is normal. Fusion hardware intact over the cervicothoracic region. Moderately displaced slightly comminuted left humeral neck fracture. IMPRESSION: No acute cardiopulmonary disease. Moderately displaced slightly comminuted left humeral neck fracture. Electronically Signed   By: Marin Olp M.D.   On: 02/09/2019 16:14   Dg Elbow Complete Left  Result Date: 02/09/2019 CLINICAL DATA:  Found on floor with left arm pain. EXAM: LEFT ELBOW - COMPLETE 3+ VIEW COMPARISON:  None. FINDINGS: There is no evidence of fracture, dislocation, or joint effusion. There is no evidence of arthropathy or other focal bone abnormality. Soft tissues are unremarkable. IMPRESSION: Negative. Electronically Signed   By: Marin Olp M.D.   On: 02/09/2019 16:14   Dg Abd 1 View  Result Date: 02/14/2019 CLINICAL DATA:  NG tube placement EXAM: ABDOMEN - 1 VIEW COMPARISON:  02/13/2019 FINDINGS: NG tube has been advanced with the tip in the proximal stomach. IMPRESSION: NG tube tip in the proximal stomach. Electronically Signed   By: Rolm Baptise M.D.   On: 02/14/2019 02:58   Dg Abd 1 View  Result Date: 02/13/2019 CLINICAL DATA:  NG tube placement EXAM: ABDOMEN - 1 VIEW COMPARISON:  02/13/2027, earlier same day FINDINGS: 2337  hours. Tip the NG tube is positioned similar to prior study, in the distal esophagus. Tube could be advanced another 11-12 cm to get the proximal side port below the GE junction. Diffuse gaseous bowel distention again noted. IMPRESSION: NG tube tip is in the distal esophagus, as above. Electronically Signed   By: Misty Stanley M.D.   On: 02/13/2019 23:58   Dg Abd 1  View  Result Date: 02/13/2019 CLINICAL DATA:  Encounter for NG tube EXAM: ABDOMEN - 1 VIEW COMPARISON:  02/13/2019 FINDINGS: NG tube tip remains in the distal esophagus, unchanged since prior study. Nonobstructive bowel gas pattern. Oral contrast material noted within large and small bowel loops. IMPRESSION: NG tube tip remains in the distal esophagus. Electronically Signed   By: Rolm Baptise M.D.   On: 02/13/2019 20:50   Ct Head Wo Contrast  Result Date: 02/09/2019 CLINICAL DATA:  Found on floor with multiple bruises. EXAM: CT HEAD WITHOUT CONTRAST CT CERVICAL SPINE WITHOUT CONTRAST TECHNIQUE: Multidetector CT imaging of the head and cervical spine was performed following the standard protocol without intravenous contrast. Multiplanar CT image reconstructions of the cervical spine were also generated. COMPARISON:  Head and cervical spine CT 05/18/2017. Brain MRI 05/04/2015. FINDINGS: CT HEAD FINDINGS Brain: There is edema anteriorly and medially in the right temporal lobe including diffuse hippocampal edema with regional sulcal effacement. There is no midline shift. Trace hyperdense blood products are present in the occipital horns of both lateral ventricles without convincing subarachnoid hemorrhage seen elsewhere. No extra-axial fluid collection is identified. There is moderate cerebellar atrophy. Vascular: Calcified atherosclerosis at the skull base. No hyperdense vessel. Skull: No fracture or focal osseous lesion. Sinuses/Orbits: Clear paranasal sinuses. Chronic appearing left mastoid effusion. Bilateral cataract extraction. Other: Minimal frontal scalp swelling. CT CERVICAL SPINE FINDINGS Alignment: Normal. Skull base and vertebrae: Prominent median C1-2 arthropathy. Solid C4-C6 ACDF. Solid instrumented posterior fusion C2-T2. Posterior decompression C3-C7. Chronic extension of the left C2 screw into the lateral aspect of the spinal canal. No acute fracture or destructive osseous process. Soft tissues  and spinal canal: No prevertebral fluid or swelling. No visible canal hematoma. Disc levels: Posterior decompression and fusion changes as above. Mild-to-moderate osseous neural foraminal stenosis on the right at C5-6. Upper chest: Clear lung apices. Other: None. IMPRESSION: 1. Edema throughout the mesial right temporal lobe. Considerations include acute to subacute infarct, infection, and tumor. This is more extensive than is typically seen with traumatic temporal lobe contusion. Brain MRI (preferably without and with contrast) is recommended for further evaluation. 2. Trace hemorrhage in both lateral ventricles. 3. No acute cervical spine fracture or subluxation. Critical Value/emergent results were called by telephone at the time of interpretation on 02/09/2019 at 4:15 pm to Dr. Duffy Bruce, who verbally acknowledged these results. Electronically Signed   By: Logan Bores M.D.   On: 02/09/2019 16:20   Ct Cervical Spine Wo Contrast  Result Date: 02/09/2019 CLINICAL DATA:  Found on floor with multiple bruises. EXAM: CT HEAD WITHOUT CONTRAST CT CERVICAL SPINE WITHOUT CONTRAST TECHNIQUE: Multidetector CT imaging of the head and cervical spine was performed following the standard protocol without intravenous contrast. Multiplanar CT image reconstructions of the cervical spine were also generated. COMPARISON:  Head and cervical spine CT 05/18/2017. Brain MRI 05/04/2015. FINDINGS: CT HEAD FINDINGS Brain: There is edema anteriorly and medially in the right temporal lobe including diffuse hippocampal edema with regional sulcal effacement. There is no midline shift. Trace hyperdense blood products are present in the occipital horns of  both lateral ventricles without convincing subarachnoid hemorrhage seen elsewhere. No extra-axial fluid collection is identified. There is moderate cerebellar atrophy. Vascular: Calcified atherosclerosis at the skull base. No hyperdense vessel. Skull: No fracture or focal osseous  lesion. Sinuses/Orbits: Clear paranasal sinuses. Chronic appearing left mastoid effusion. Bilateral cataract extraction. Other: Minimal frontal scalp swelling. CT CERVICAL SPINE FINDINGS Alignment: Normal. Skull base and vertebrae: Prominent median C1-2 arthropathy. Solid C4-C6 ACDF. Solid instrumented posterior fusion C2-T2. Posterior decompression C3-C7. Chronic extension of the left C2 screw into the lateral aspect of the spinal canal. No acute fracture or destructive osseous process. Soft tissues and spinal canal: No prevertebral fluid or swelling. No visible canal hematoma. Disc levels: Posterior decompression and fusion changes as above. Mild-to-moderate osseous neural foraminal stenosis on the right at C5-6. Upper chest: Clear lung apices. Other: None. IMPRESSION: 1. Edema throughout the mesial right temporal lobe. Considerations include acute to subacute infarct, infection, and tumor. This is more extensive than is typically seen with traumatic temporal lobe contusion. Brain MRI (preferably without and with contrast) is recommended for further evaluation. 2. Trace hemorrhage in both lateral ventricles. 3. No acute cervical spine fracture or subluxation. Critical Value/emergent results were called by telephone at the time of interpretation on 02/09/2019 at 4:15 pm to Dr. Duffy Bruce, who verbally acknowledged these results. Electronically Signed   By: Logan Bores M.D.   On: 02/09/2019 16:20   Mr Brain W And Wo Contrast  Result Date: 02/09/2019 CLINICAL DATA:  Recent fall.  Possible intracranial hemorrhage. EXAM: MRI HEAD WITHOUT AND WITH CONTRAST TECHNIQUE: Multiplanar, multiecho pulse sequences of the brain and surrounding structures were obtained without and with intravenous contrast. CONTRAST:  7 mL Gadavist COMPARISON:  Head CT 02/09/2019 FINDINGS: BRAIN: There is abnormal diffusion restriction within the mesial right temporal lobe. There is a small amount of blood dependently within both lateral  ventricles. The midline structures are normal. Moderate medial right temporal lobe edema. Minimal white matter hyperintensity, nonspecific and commonly seen in asymptomatic patients of this age. The cerebral and cerebellar volume are age-appropriate. No hydrocephalus. Susceptibility-sensitive sequences show no chronic microhemorrhage or superficial siderosis. No abnormal contrast enhancement. VASCULAR: The major intracranial arterial and venous sinus flow voids are normal. SKULL AND UPPER CERVICAL SPINE: Calvarial bone marrow signal is normal. There is no skull base mass. Visualized upper cervical spine and soft tissues are normal. SINUSES/ORBITS: No fluid levels or advanced mucosal thickening. No mastoid or middle ear effusion. The orbits are normal. IMPRESSION: 1. Abnormal diffusion restriction within the mesial right temporal lobe, most consistent with acute or early subacute infarct. This distribution may also be seen in infection, particularly herpes encephalitis, though this seems less likely in this presentation, particularly if the patient is afebrile. 2. Trace intraventricular blood products within the occipital horns of both lateral ventricles. Electronically Signed   By: Ulyses Jarred M.D.   On: 02/09/2019 20:34   Dg Abd Portable 1v  Result Date: 02/13/2019 CLINICAL DATA:  NG tube placement. EXAM: PORTABLE ABDOMEN - 1 VIEW COMPARISON:  None. FINDINGS: NG tube is identified with tip overlying the distal esophagus. Recommend advancement. IMPRESSION: NG tube with tip overlying the distal esophagus. Recommend advancement. Electronically Signed   By: Margarette Canada M.D.   On: 02/13/2019 18:05   Dg Humerus Left  Result Date: 02/09/2019 CLINICAL DATA:  Found on floor with left arm pain. EXAM: LEFT HUMERUS - 2+ VIEW COMPARISON:  None. FINDINGS: Exam demonstrates a moderately displaced left humeral neck fracture with mild comminution  and extending into the humeral head with mild comminution of the humeral  head. There is a fracture fragment adjacent the medial aspect of the humeral neck likely from the humeral fracture, although cannot completely exclude an adjacent glenoid fracture. Remainder the exam is unremarkable. IMPRESSION: Moderately displaced mildly comminuted left humeral neck fracture with extension into the humeral head with mild comminution of the humeral head. It would be difficult to exclude an adjacent glenoid fracture. Electronically Signed   By: Marin Olp M.D.   On: 02/09/2019 16:18   Dg Swallowing Func-speech Pathology  Result Date: 02/16/2019 Objective Swallowing Evaluation: Type of Study: MBS-Modified Barium Swallow Study  Patient Details Name: Johari Pinney MRN: [id number] Date of Birth: 12-02-1955 Today's Date: 02/16/2019 Time: SLP Start Time (ACUTE ONLY): 8099 -SLP Stop Time (ACUTE ONLY): 1310 SLP Time Calculation (min) (ACUTE ONLY): 25 min Past Medical History: Past Medical History: Diagnosis Date . Dysarthria  . Dystonia  . Gait disturbance  . Myelopathy ([place])  . Paraplegia [facility])  Past Surgical History: Past Surgical History: Procedure Laterality Date . NECK SURGERY    dec 2014 HPI: Micai Apolinar is a 63 y.o. male presenting with history of paraplegia, myelopathy, dysarthria, cervical dystonia, (?CNS degenerative disorder )admitted from [facility] living facility with AMS after being found down. 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 This distribution may also be seen in infection, particularly herpes encephalitis, though this seems less likely in this presentation. Sustained  aleft humerus fx, rhabdomyolsis. 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  Subjective: cooperative, fidgeting and moving around in chair in radiology Assessment / Plan / Recommendation CHL IP CLINICAL IMPRESSIONS 02/16/2019 Clinical Impression Patient presents with a severe oral and mod-severe  pharyngeal dysphagia which is sensory-motor based and likely acute exacerbation of chronic issues. As compared to MBS completed on 4/19, he is demonstrating improved ability to pharyngeally transit boluses, however he continues to have significant difficulty and inability to effectively transit heavier boluses of honey thick liquids and puree solids and residuals remain in vallecular sinus, posterior pharyngeal wall and some in pyriform sinus, resulting in aspiration when residuals mixed with nectar thick liquids. Oral phase was delayed with patient exhibiting lingual pumping, discoordination of swallow with poor control of bolus and delays in posterior transit of bolus. These delays also occured when patient drank thin liquids through straw, as he took sip, held in oral cavity and manipulated before a swallow was initiated.  Patient exhibited swallow initiation delays to pyriform sinus with thin liquids and aspiration of minimal amount during the swallow. With nectar thick liquids, patient did exhibit one instance of silent aspiraiton, however it appeared to be nectar thick liquids mixing with residuals in pharynx. Again, amount of aspirate was minimal. Patient with significant reduction in cricopharyngeal relaxtation, leading to poor UES opening and build up of heavier boluses in pharynx, but with eventual transit.  Patient continues to be at a very high risk of aspiration and safest PO recommendation at this time is nectar thick liquids only, but with known risk of aspiration. SLP Visit Diagnosis Dysphagia, oropharyngeal phase (R13.12) Attention and concentration deficit following -- Frontal lobe and executive function deficit following -- Impact on safety and function Severe aspiration risk;Risk for inadequate nutrition/hydration   CHL IP TREATMENT RECOMMENDATION 02/16/2019 Treatment Recommendations Therapy as outlined in treatment plan below   Prognosis 02/16/2019 Prognosis for Safe Diet Advancement Fair Barriers  to Reach Goals  Severity of deficits Barriers/Prognosis Comment -- CHL IP DIET RECOMMENDATION 02/16/2019 SLP Diet Recommendations Nectar thick liquid Liquid Administration via Cup;Spoon Medication Administration Other (Comment) Compensations Minimize environmental distractions;Slow rate;Small sips/bites Postural Changes Seated upright at 90 degrees;Remain semi-upright after after feeds/meals (Comment)   CHL IP OTHER RECOMMENDATIONS 02/16/2019 Recommended Consults -- Oral Care Recommendations Oral care QID Other Recommendations Order thickener from pharmacy;Prohibited food (jello, ice cream, thin soups);Have oral suction available;Clarify dietary restrictions   CHL IP FOLLOW UP RECOMMENDATIONS 02/16/2019 Follow up Recommendations Skilled Nursing facility   [facility] IP FREQUENCY AND DURATION 02/16/2019 Speech Therapy Frequency (ACUTE ONLY) min 2x/week Treatment Duration 2 weeks      CHL IP ORAL PHASE 02/16/2019 Oral Phase Impaired Oral - Pudding Teaspoon -- Oral - Pudding Cup -- Oral - Honey Teaspoon NT Oral - Honey Cup Lingual pumping;Weak lingual manipulation;Reduced posterior propulsion;Piecemeal swallowing;Delayed oral transit Oral - Nectar Teaspoon Weak lingual manipulation;Lingual pumping;Reduced posterior propulsion;Delayed oral transit Oral - Nectar Cup Weak lingual manipulation;Reduced posterior propulsion;Piecemeal swallowing;Delayed oral transit Oral - Nectar Straw -- Oral - Thin Teaspoon Weak lingual manipulation;Reduced posterior propulsion;Delayed oral transit Oral - Thin Cup Delayed oral transit;Lingual pumping;Weak lingual manipulation;Reduced posterior propulsion Oral - Thin Straw Reduced posterior propulsion;Delayed oral transit Oral - Puree Weak lingual manipulation;Reduced posterior propulsion;Piecemeal swallowing;Delayed oral transit;Decreased bolus cohesion;Lingual pumping Oral - Mech Soft -- Oral - Regular -- Oral - Multi-Consistency -- Oral - Pill Weak lingual manipulation;Reduced posterior  propulsion;Other (Comment) Oral Phase - Comment --  CHL IP PHARYNGEAL PHASE 02/16/2019 Pharyngeal Phase Impaired Pharyngeal- Pudding Teaspoon -- Pharyngeal -- Pharyngeal- Pudding Cup -- Pharyngeal -- Pharyngeal- Honey Teaspoon NT Pharyngeal -- Pharyngeal- Honey Cup Reduced pharyngeal peristalsis;Delayed swallow initiation-vallecula;Pharyngeal residue - valleculae;Pharyngeal residue - posterior pharnyx Pharyngeal Material does not enter airway Pharyngeal- Nectar Teaspoon Delayed swallow initiation-vallecula;Reduced airway/laryngeal closure;Penetration/Aspiration during swallow Pharyngeal Material enters airway, CONTACTS cords and not ejected out Pharyngeal- Nectar Cup Reduced laryngeal elevation;Delayed swallow initiation-vallecula;Penetration/Apiration after swallow Pharyngeal Material enters airway, passes BELOW cords without attempt by patient to eject out (silent aspiration);Other (Comment) Pharyngeal- Nectar Straw NT Pharyngeal -- Pharyngeal- Thin Teaspoon Delayed swallow initiation-pyriform sinuses;Reduced airway/laryngeal closure;Penetration/Aspiration during swallow;Trace aspiration Pharyngeal Material enters airway, passes BELOW cords without attempt by patient to eject out (silent aspiration) Pharyngeal- Thin Cup Delayed swallow initiation-pyriform sinuses;Reduced airway/laryngeal closure;Penetration/Aspiration during swallow Pharyngeal Material enters airway, passes BELOW cords without attempt by patient to eject out (silent aspiration) Pharyngeal- Thin Straw Delayed swallow initiation-pyriform sinuses;Penetration/Aspiration during swallow;Reduced laryngeal elevation;Reduced airway/laryngeal closure Pharyngeal Material enters airway, passes BELOW cords without attempt by patient to eject out (silent aspiration) Pharyngeal- Puree Delayed swallow initiation-vallecula;Reduced tongue base retraction;Reduced pharyngeal peristalsis;Pharyngeal residue - valleculae;Pharyngeal residue - pyriform;Pharyngeal residue -  posterior pharnyx Pharyngeal -- Pharyngeal- Mechanical Soft -- Pharyngeal -- Pharyngeal- Regular -- Pharyngeal -- Pharyngeal- Multi-consistency -- Pharyngeal -- Pharyngeal- Pill Delayed swallow initiation-vallecula;Other (Comment);Pharyngeal residue - valleculae;Pharyngeal residue - pyriform;Pharyngeal residue - posterior pharnyx;Reduced laryngeal elevation;Reduced tongue base retraction;Reduced pharyngeal peristalsis Pharyngeal -- Pharyngeal Comment --  CHL IP CERVICAL ESOPHAGEAL PHASE 02/16/2019 Cervical Esophageal Phase Impaired Pudding Teaspoon -- Pudding Cup -- Honey Teaspoon -- Honey Cup Reduced cricopharyngeal relaxation Nectar Teaspoon Reduced cricopharyngeal relaxation Nectar Cup Reduced cricopharyngeal relaxation Nectar Straw NT Thin Teaspoon Reduced cricopharyngeal relaxation Thin Cup Reduced cricopharyngeal relaxation Thin Straw Reduced cricopharyngeal relaxation Puree -- Mechanical Soft -- Regular -- Multi-consistency -- Pill -- Cervical Esophageal Comment Patient exhibited one instance of regurgitation of puree solids from pharynx and orally expectorated. This appeared due to patient's UES not opening and despite patient's efforts, repeated swallows  did not help.  [name] 02/16/2019, 2:38 PM      Sonia Baller, MA, CCC-SLP Speech Therapy [facility] Acute Rehab Pager: [phone]        Dg Swallowing Func-speech Pathology  Result Date: 02/13/2019 Objective Swallowing Evaluation: Type of Study: MBS-Modified Barium Swallow Study  Patient Details Name: Derrick Tiegs MRN: [id number] Date of Birth: [birth year] Today's Date: 02/13/2019 Time: SLP Start Time (ACUTE ONLY): 1505 -SLP Stop Time (ACUTE ONLY): 1550 SLP Time Calculation (min) (ACUTE ONLY): 45 min Past Medical History: Past Medical History: Diagnosis Date . Dysarthria  . Dystonia  . Gait disturbance  . Myelopathy ([place])  . Paraplegia [facility])  Past Surgical History: Past Surgical History: Procedure Laterality Date . NECK SURGERY    dec 2014 HPI: Neyland Pettengill is a 63 y.o. male presenting with history of paraplegia, myelopathy, dysarthria, cervical dystonia, (?CNS degenerative disorder )admitted from indpendendent living facility with AMS after being found down. 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 This distribution may also be seen in infection, particularly herpes encephalitis, though this seems less likely in this presentation. Sustained  aleft humerus fx, rhabdomyolsis. 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  Subjective: alert, upright in chair for procedure requires cueing Assessment / Plan / Recommendation CHL IP CLINICAL IMPRESSIONS 02/13/2019 Clinical Impression A severe oral and moderate pharyngeal dysphagia was exhibited, likely of multifactorial etiology. Pt unsafe for PO at this time, short term alternative nutrition recommendations were discussed with Dr. Ky Barban. Pt with extremtly poor oral hygiene, nursing notes despite oral care attempts, persistent buildup persists. Discussed possibility of adding  external humidification to assist with moisture/prevent dry secretion buildup with MD.  Copious dried palatal secretions were extracted by SLP prior to exam. Oral deficits include poor bolus control, ineffective bolus transit, lingual pumping, anterior spillage, and oral residuals. Pharyngeal deficits c/b reduced base of tongue retraction, decreased pharyngeal constriction, reduced timing and efficiency of laryngeal vestibule closure, reduced UES opening, and decreased laryngeal elevation resulting in: -episodic penetration of thin, nectar thick, and  honey thick liquids during and after the swallow. Moderate residuals noted in the valleculae and pyriform sinuses, increasing risk for aspiraiton. SLP to follow up for dysphagia intervention.  SLP Visit Diagnosis Dysphagia, oropharyngeal phase (R13.12) Attention and concentration  deficit following -- Frontal lobe and executive function deficit following -- Impact on safety and function Severe aspiration risk;Risk for inadequate nutrition/hydration   CHL IP TREATMENT RECOMMENDATION 02/13/2019 Treatment Recommendations Therapy as outlined in treatment plan below   Prognosis 02/13/2019 Prognosis for Safe Diet Advancement Fair Barriers to Reach Goals Severity of deficits Barriers/Prognosis Comment -- CHL IP DIET RECOMMENDATION 02/13/2019 SLP Diet Recommendations NPO;Alternative means - temporary Liquid Administration via -- Medication Administration Via alternative means Compensations -- Postural Changes --   CHL IP OTHER RECOMMENDATIONS 02/13/2019 Recommended Consults -- Oral Care Recommendations Oral care QID Other Recommendations --   CHL IP FOLLOW UP RECOMMENDATIONS 02/13/2019 Follow up Recommendations Skilled Nursing facility   [facility] IP FREQUENCY AND DURATION 02/13/2019 Speech Therapy Frequency (ACUTE ONLY) min 2x/week Treatment Duration 2 weeks      CHL IP ORAL PHASE 02/13/2019 Oral Phase Impaired Oral - Pudding Teaspoon -- Oral - Pudding Cup -- Oral - Honey Teaspoon Delayed oral transit;Decreased bolus cohesion;Lingual/palatal residue;Reduced posterior propulsion;Incomplete tongue to palate contact;Lingual pumping;Weak lingual manipulation Oral - Honey Cup -- Oral - Nectar Teaspoon -- Oral - Nectar Cup Delayed oral transit;Lingual/palatal  residue;Reduced posterior propulsion;Incomplete tongue to palate contact;Lingual pumping;Weak lingual manipulation Oral - Nectar Straw -- Oral - Thin Teaspoon Other (Comment) Oral - Thin Cup Weak lingual manipulation;Lingual pumping;Pocketing in anterior sulcus;Lingual/palatal residue;Delayed oral transit;Decreased bolus cohesion;Incomplete tongue to palate contact;Reduced posterior propulsion Oral - Thin Straw Weak lingual manipulation;Lingual pumping;Incomplete tongue to palate contact;Reduced posterior propulsion;Holding of bolus;Lingual/palatal  residue;Delayed oral transit Oral - Puree -- Oral - Mech Soft -- Oral - Regular -- Oral - Multi-Consistency -- Oral - Pill -- Oral Phase - Comment --  CHL IP PHARYNGEAL PHASE 02/13/2019 Pharyngeal Phase Impaired Pharyngeal- Pudding Teaspoon -- Pharyngeal -- Pharyngeal- Pudding Cup -- Pharyngeal -- Pharyngeal- Honey Teaspoon Reduced laryngeal elevation;Reduced anterior laryngeal mobility;Reduced epiglottic inversion;Reduced pharyngeal peristalsis;Reduced tongue base retraction;Penetration/Aspiration during swallow;Pharyngeal residue - valleculae;Pharyngeal residue - pyriform;Pharyngeal residue - posterior pharnyx Pharyngeal Material enters airway, remains ABOVE vocal cords and not ejected out Pharyngeal- Honey Cup -- Pharyngeal -- Pharyngeal- Nectar Teaspoon -- Pharyngeal -- Pharyngeal- Nectar Cup Pharyngeal residue - valleculae;Pharyngeal residue - pyriform;Pharyngeal residue - posterior pharnyx;Reduced airway/laryngeal closure;Reduced laryngeal elevation;Reduced anterior laryngeal mobility;Reduced epiglottic inversion;Reduced pharyngeal peristalsis;Reduced tongue base retraction;Penetration/Apiration after swallow;Penetration/Aspiration during swallow Pharyngeal Material enters airway, remains ABOVE vocal cords and not ejected out Pharyngeal- Nectar Straw -- Pharyngeal -- Pharyngeal- Thin Teaspoon -- Pharyngeal -- Pharyngeal- Thin Cup Reduced epiglottic inversion;Reduced anterior laryngeal mobility;Reduced laryngeal elevation;Reduced tongue base retraction;Pharyngeal residue - valleculae;Pharyngeal residue - pyriform;Reduced pharyngeal peristalsis Pharyngeal -- Pharyngeal- Thin Straw Pharyngeal residue - pyriform;Pharyngeal residue - valleculae;Penetration/Aspiration during swallow;Reduced airway/laryngeal closure;Reduced laryngeal elevation;Reduced anterior laryngeal mobility;Reduced epiglottic inversion;Reduced pharyngeal peristalsis;Pharyngeal residue - posterior pharnyx Pharyngeal Material enters airway, remains  ABOVE vocal cords and not ejected out Pharyngeal- Puree -- Pharyngeal -- Pharyngeal- Mechanical Soft -- Pharyngeal -- Pharyngeal- Regular -- Pharyngeal -- Pharyngeal- Multi-consistency -- Pharyngeal -- Pharyngeal- Pill -- Pharyngeal -- Pharyngeal Comment --  CHL IP CERVICAL ESOPHAGEAL PHASE 02/13/2019 Cervical Esophageal Phase Impaired Pudding Teaspoon -- Pudding Cup -- Honey Teaspoon Reduced cricopharyngeal relaxation Honey Cup -- Nectar Teaspoon -- Nectar Cup Reduced cricopharyngeal relaxation Nectar Straw -- Thin Teaspoon -- Thin Cup -- Thin Straw Reduced cricopharyngeal relaxation Puree -- Mechanical Soft -- Regular -- Multi-consistency -- Pill -- Cervical Esophageal Comment -- Chelsea E Hartness MA, CCC-SLP 02/13/2019, 4:07 PM                Caroline More, DO 02/27/2019, 6:49 AM PGY-2, [place] Intern pager: [phone], text pages welcome

## 2019-02-26 NOTE — Progress Notes (Addendum)
Family Medicine Teaching Service Daily Progress Note Intern Pager: [phone]  Patient name: Patrick Daniel Medical record number: [id number] Date of birth: [birth year] Age: 63 y.o. Gender: male  Primary Care Provider: Marco Collie, MD Consultants: neuro, ID, orthopedics (s/o) Code Status: FULL  Pt Overview and Major Events to Date:  4/15 admitted having been found down, AMS, neuro consulted, LP suggestive of HSV encephalitis, acyclovir started, 1/2 Blood cx positive for Coag neg Staph, CSF cx NGTD, CSF fungal NGTD, Anaerobic cx NGTD 4/16 Cefepime discontinued  4/17 Repeat blood cultures  4/18 Vanc Discontinued, ID added linezolid 4/20 acute renal failure, nephro consulted, pt unable to keep NG down 4/25 1 unit pRBC transfusion  Antimicrobials: Vancomycin 4/15-4/18 - done Cefepime 4/15-4/16 - done Linezolid 4/18-4/21 - done Acyclovir 4/15-5/7 - active  Assessment and Plan: Patrick Daniel is a 63 y.o. 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 PMH is significant for cervical dystonia, dysarthria, HLD, gait abnormality, h/o myelopathy.   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 Still with severe dysarthria. Appears to understand what is being said, but cannot speak appropriate words. Has made great improvement while admitted. SLP continues to follow for rehab. - tylenol PRN for fever/pain - continue IV acyclovir x3 weeks (4/16-5/7) - F/U ID and palliative recs - Continue SLP rehab - tylenol prn for fever/pain  Urinary retention He has been having ongoing issues related to urinary retention.  Trial of Tamsulosin was attempted without success.  Now with indwelling catheter and plan to follow-up with urology. - strict I/O - will restart flomax at 0.4mg  daily - could consider increasing flomax to 0.8 or adding finesteride  AKI 2/2 ATN, resolved.  Peak Cr 4.1. Now improved to baseline.  Creatinine 0.74 in 5/1. - am bmp two  times per week  Methicillin Resistant CoNS Bacteremia-resolved s/p 7d tx with Vancomycin (4/15-4/18) and Linezolid (4/18-4/21) with repeat Bcx negative.  Hypernatremia Sodium 147 in the morning of 5/1.  Will encourage more free water intake. - BMP two times per week   Hypokalemia -resolved Potassium 3.7 in am of 5/1. - Replete PRN - BMP two times per week  Anemia, stable Hemoglobin 8.1 in am of 5/2 - cbc once per week  HTN-improved.  Well controlled in the past 24 hours. - continue amlodipine 10mg  daily - vitals per floor protocol   L humeral neck fracture-  Ecchymosis and hematoma stable - PRN tylenol, monitor for pain - monitor daily  FEN/GI: dysphagia 1 with nectar thick liquids  Prophylaxis: SCDs, no pharm DVT ppx d/t blood in ventricles on MRI brain  Disposition: Plan to continue monitoring inpatient for the duration of his acyclovir course which will finish on 5/7.   Subjective:  Resting comfortably. No distess. Able to understand some words patient spoke  Objective: Temp:  [97.6 F (36.4 C)-98.2 F (36.8 C)] 98.2 F (36.8 C) (05/02 0735) Pulse Rate:  [96-108] 97 (05/02 0735) Resp:  [16-18] 17 (05/02 0735) BP: (105-126)/(68-84) 110/75 (05/02 0735) SpO2:  [82 %-100 %] 99 % (05/02 0735)   General: No acute distress, resting comfortably Cardio: rrr, no m/r/g. Pulm: clear to ausculation bilaterally, no accessory muscle use Abdomen: s, nt, nd Extremities: No peripheral edema. Warm/ well perfused.  Strong radial pulse. Neuro: Cranial nerves grossly intact. Notable dysarthria, approximately 1 out of 5 words appropriate and understandable  Laboratory: Recent Labs  Lab 02/22/19 0846 02/23/19 0738 02/25/19 0327  WBC 8.6 9.8 9.4  HGB 8.7* 8.6*  8.1*  HCT 25.7* 25.7* 24.3*  PLT 442* 441* 493*   Recent Labs  Lab 02/23/19 0738 02/24/19 0418 02/25/19 0327  NA 144 146* 147*  K 3.7 3.3* 3.7  CL 111 112* 114*  CO2 23 24 23   BUN 18 21* 20  CREATININE 0.64  0.70 0.74  CALCIUM 8.8* 8.8* 8.8*  GLUCOSE 108* 112* 107*   Iron/TIBC/Ferritin/ %Sat    Component Value Date/Time   IRON 51 02/11/2019 0342   TIBC 230 (L) 02/11/2019 0342   FERRITIN 327 02/11/2019 1350   IRONPCTSAT 22 02/11/2019 0342    Imaging/Diagnostic Tests: No results found.  Guadalupe Dawn, MD 02/26/2019, 8:40 AM PGY-2, [place] Intern pager: [phone], text pages welcome

## 2019-02-26 NOTE — Progress Notes (Signed)
New IV line placed in patient. Line covered/ disguised to prevent patient from removing line.

## 2019-02-27 LAB — GLUCOSE, CAPILLARY
Glucose-Capillary: 100 mg/dL — ABNORMAL HIGH (ref 70–99)
Glucose-Capillary: 105 mg/dL — ABNORMAL HIGH (ref 70–99)
Glucose-Capillary: 112 mg/dL — ABNORMAL HIGH (ref 70–99)
Glucose-Capillary: 114 mg/dL — ABNORMAL HIGH (ref 70–99)
Glucose-Capillary: 126 mg/dL — ABNORMAL HIGH (ref 70–99)
Glucose-Capillary: 127 mg/dL — ABNORMAL HIGH (ref 70–99)

## 2019-02-27 LAB — BASIC METABOLIC PANEL
Anion gap: 12 (ref 5–15)
BUN: 25 mg/dL — ABNORMAL HIGH (ref 6–20)
CO2: 24 mmol/L (ref 22–32)
Calcium: 9.2 mg/dL (ref 8.9–10.3)
Chloride: 109 mmol/L (ref 98–111)
Creatinine, Ser: 0.84 mg/dL (ref 0.61–1.24)
GFR calc Af Amer: >60 mL/min (ref >60)
GFR calc non Af Amer: >60 mL/min (ref >60)
Glucose, Bld: 120 mg/dL — ABNORMAL HIGH (ref 70–99)
Potassium: 3.8 mmol/L (ref 3.5–5.1)
Sodium: 145 mmol/L (ref 135–145)

## 2019-02-27 LAB — CBC
HCT: 26.6 % — ABNORMAL LOW (ref 39.0–52.0)
Hemoglobin: 8.8 g/dL — ABNORMAL LOW (ref 13.0–17.0)
MCH: 31.8 pg (ref 26.0–34.0)
MCHC: 33.1 g/dL (ref 30.0–36.0)
MCV: 96 fL (ref 80.0–100.0)
Platelets: 476 10*3/uL — ABNORMAL HIGH (ref 150–400)
RBC: 2.77 MIL/uL — ABNORMAL LOW (ref 4.22–5.81)
RDW: 15 % (ref 11.5–15.5)
WBC: 10.1 10*3/uL (ref 4.0–10.5)
nRBC: 0 % (ref 0.0–0.2)

## 2019-02-27 NOTE — Progress Notes (Signed)
Pharmacy Antibiotic Note  Patrick Daniel is a 63 y.o. 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  Pharmacy has been consulted for acyclovir IV dosing.  Patient with resolved AKI (likely multifactorial due to acyclovir IV therapy, vancomycin, dehydration and rhabdo on admit). Scr recovered 3.42>>0.70>0.84, estimated CrCl ~75 ml/min.   Plan: Continue acyclovir 10mg /kg IV q8h Monitor renal function and LOT  Height: 5\' 6"  (167.6 cm) Weight: 125 lb 3.5 oz (56.8 kg) IBW/kg (Calculated) : 63.8  Temp (24hrs), Avg:98.9 F (37.2 C), Min:98.2 F (36.8 C), Max:99.6 F (37.6 C)  Recent Labs  Lab 02/21/19 0723 02/22/19 0355 02/22/19 0846 02/23/19 0738 02/24/19 0418 02/25/19 0327 02/27/19 0439  WBC 11.0*  --  8.6 9.8  --  9.4 10.1  CREATININE  --  0.87  --  0.64 0.70 0.74 0.84    Estimated Creatinine Clearance: 75.1 mL/min (by C-G formula based on SCr of 0.84 mg/dL).    Allergies  Allergen Reactions  . Baclofen Other (See Comments)    Weakness, drowsiness   [name] A. Levada Dy, PharmD, [place] Please utilize Amion for appropriate phone number to reach the unit pharmacist ([place])   02/27/2019 8:37 AM

## 2019-02-28 DIAGNOSIS — R627 Adult failure to thrive: Secondary | ICD-10-CM

## 2019-02-28 MED ORDER — TAMSULOSIN HCL 0.4 MG PO CAPS
0.4000 mg | ORAL_CAPSULE | Freq: Once | ORAL | Status: AC
  Administered 2019-02-28: 13:00:00 0.4 mg via ORAL
  Filled 2019-02-28: qty 1

## 2019-02-28 MED ORDER — TAMSULOSIN HCL 0.4 MG PO CAPS
0.8000 mg | ORAL_CAPSULE | Freq: Every day | ORAL | Status: DC
  Administered 2019-03-01 – 2019-03-04 (×4): 0.8 mg via ORAL
  Filled 2019-02-28 (×4): qty 2

## 2019-02-28 NOTE — Progress Notes (Signed)
Family Medicine Teaching Service Daily Progress Note Intern Pager: [phone]  Patient name: Patrick Daniel Medical record number: [id number] Date of birth: 12/19/1955 Age: 63 y.o. Gender: male  Primary Care Provider: Marco Collie, MD Consultants: ID Code Status: DNR   Pt Overview and Major Events to Date:  Hospital Day: 20 02/09/2019: admitted for weakness & AMS, neuro consulted, LP suggestive of HSV encephalitis, acyclovir started, 1/2 Blood cx positive for Coag neg Staph, CSF cx NGTD, CSF fungal NGTD, Anaerobic cx NGTD. Vanc (4/15-4/18) and Linezolid (4/18-4/21), cefepime 4/15-4/16). IV Acyclovir (4/15-5/7) 4/16 Cefepime discontinued  4/17 Repeat blood cultures  4/18 Vanc Discontinued, ID added linezolid 4/20 acute renal failure, nephro consulted, pt unable to keep NG down 4/25 1 unit pRBC transfusion  Assessment and Plan: Patrick Daniel a 63 y.o.male being treated for HSV-2 encephalopathy. PMH is significant forcervical dystonia, dysarthria, HLD, gait abnormality, h/o myelopathy.  HSV 2 encephalitis with severe dysarthria (improving) and dysphagia Continues to work with speech with good improvement since admission.  200 cc p.o. yesterday  Tylenol PRN fever  Continue IV acyclovir, and 5/7  Follow-up infectious disease and palliative recommendations  Continue speech rehabilitation  Continue dysphagia 1 diet  Labs q3 days  Urinary retention Continues to have indwelling catheter, placed on 4/28. Started flomax on 5/2.  Output 1.65 L  Strict I's and O's  Increase Flomax 0.8 mg daily  Can consider increasing Flomax to 0.8 or adding finasteride if you are not improved  Plans to d/c Foley on Wednesday for voiding trial   If drop in BP, d/c amlodipine   Methicillin Resistant CoNS Bacteremia-resolved s/p 7d tx with Vancomycin (4/15-4/18) and Linezolid (4/18-4/21) with repeat Bcx negative.  Hypernatremia, stable Sodium 145 on 5/3.  Likely due to low p.o. intake in  the setting of dysphasia.  Continue BMP every 3 days  Anemia, stable Hemoglobin on 5/3 was 8.8.  Once weekly CBC  Hypertension, improved Patient is normotensive, last blood pressure 105/79  Continue amlodipine  D/C   Left humeral neck fracture  Tylenol as needed pain  Monitor for pain with daily physical exam  #FEN/GI: Dysphagia 1 w/ nectar thick liquids  Access: Rt PIV 5/2, Urethral catheter (4/28)  VTE prophylaxis: SCDs  Disposition: Reamin inpatient until 5/7 fro IV acyclovir   ================================================================ Subjective:  NAEO.   Objective: Temp:  [98.1 F (36.7 C)-99 F (37.2 C)] 98.1 F (36.7 C) (05/04 0325) Pulse Rate:  [76-115] 76 (05/04 0325) Resp:  [17-18] 18 (05/04 0325) BP: (103-128)/(58-81) 105/79 (05/04 0325) SpO2:  [96 %-98 %] 96 % (05/04 0325) Weight:  [57.8 kg] 57.8 kg (05/04 0500) 05/03 0701 - 05/04 0700 In: 1249.8 [P.O.:200; IV Piggyback:1049.8] Out: 950 [Urine:950]   Physical Exam: General: NAD, non-toxic, well-appearing, sitting comfortably in chair.  Mouth: Thick yellow-ish mucous on roof of mouth.  Cardiovascular: RRR, normal S1, S2. 2+ RP bilaterally. No BLEE  Respiratory: CTAB. No IWOB.  Abdomen: + BS. NT, ND, soft to palpation.   Laboratory: I have personally read and reviewed all labs and imaging studies.  CBC: Recent Labs  Lab 02/21/19 0723 02/22/19 0846 02/23/19 0738 02/25/19 0327 02/27/19 0439  WBC 11.0* 8.6 9.8 9.4 10.1  HGB 8.3* 8.7* 8.6* 8.1* 8.8*  HCT 24.2* 25.7* 25.7* 24.3* 26.6*  MCV 91.7 93.8 94.5 96.0 96.0  PLT 335 442* 441* 493* 476*   CMP: Recent Labs  Lab 02/22/19 0355 02/23/19 0738 02/24/19 0418 02/25/19 0327 02/27/19 0439  NA 142 144 146* 147* 145  K 3.7  3.7 3.3* 3.7 3.8  CL 111 111 112* 114* 109  CO2 19* 23 24 23 24   GLUCOSE 94 108* 112* 107* 120*  BUN 19 18 21* 20 25*  CREATININE 0.87 0.64 0.70 0.74 0.84  CALCIUM 8.6* 8.8* 8.8* 8.8* 9.2      GFR: Estimated Creatinine Clearance: 76.5 mL/min (by C-G formula based on SCr of 0.84 mg/dL).  CBG: Recent Labs  Lab 02/27/19 0327 02/27/19 0740 02/27/19 1128 02/27/19 1622 02/27/19 1905  GLUCAP 127* 105* 100* 126* 114*    Imaging/Diagnostic Tests: No results found.  EKG Interpretation  Date/Time:  Wednesday February 09 2019 14:46:42 EDT Ventricular Rate:  127 PR Interval:    QRS Duration: 83 QT Interval:  305 QTC Calculation: 444 R Axis:   88 Text Interpretation:  Sinus tachycardia Borderline right axis deviation Consider left ventricular hypertrophy No old tracing to compare Confirmed by Duffy Bruce [phone]) on 02/09/2019 3:11:03 PM        Wilber Oliphant, MD 02/28/2019, 6:29 AM PGY-1, [place] Intern pager: [phone], text pages welcome

## 2019-02-28 NOTE — Progress Notes (Signed)
Physical Therapy Treatment Patient Details Name: Patrick Daniel MRN: [id number] DOB: Mar 01, 1956 Today's Date: 02/28/2019    History of Present Illness 63 y.o. male presenting with AMS after being found down at [place]. PMH is significant for cervical dystonia, dysarthria, HLD, gait abnormality (?CNS degenerative disorder?). Patient unable to provide insight to what caused fall given chronic speech issues that have acutely worsened. Known mobility issues at baseline with frequent falls. Concern for traumatic head injury after fall vs stroke vs seizure. Head CT with temporal lobe edema. MRI with concern for acute or early subacute infarct vs infection like HSV. Pt also found to have comminuted fx of L humeral neck, ortho following.     PT Comments    Functionally pt making minimal gains towards physical therapy goals. Was able to demonstrate some improvement in tolerance for standing activity today. Continues to frequently try to use LUE despite constant multimodal cues. Will continue to follow and progress as able per POC.    Follow Up Recommendations  SNF;Supervision/Assistance - 24 hour     Equipment Recommendations  Other (comment)(TBD by next venue of care)    Recommendations for Other Services       Precautions / Restrictions Precautions Precautions: Fall;Shoulder Type of Shoulder Precautions: sling, NWB  Shoulder Interventions: Shoulder sling/immobilizer Precaution Comments: impulsive Required Braces or Orthoses: Sling Restrictions Weight Bearing Restrictions: Yes LUE Weight Bearing: Non weight bearing    Mobility  Bed Mobility Overal bed mobility: Needs Assistance Bed Mobility: Supine to Sit Rolling: Mod assist Sidelying to sit: +2 for physical assistance;Mod assist Supine to sit: Mod assist;HOB elevated Sit to supine: Mod assist;+2 for physical assistance   General bed mobility comments: Increased initiation of movement today. Pt was able to transition to EOB with light mod  assist, mainly for scooting out fully to EOB.   Transfers Overall transfer level: Needs assistance Equipment used: 2 person hand held assist Transfers: Sit to/from Stand Sit to Stand: Mod assist;+2 physical assistance         General transfer comment: [name] utilized for sit<>stand practice. x4 sit<>stand trials total with last stand reaching ~40 seconds. Assist required to keep LUE from reaching towards rail to hold on  Ambulation/Gait             General Gait Details: Not able to progress ambulation this session.    Stairs             Wheelchair Mobility    Modified Rankin (Stroke Patients Only)       Balance Overall balance assessment: Needs assistance Sitting-balance support: Feet supported Sitting balance-Leahy Scale: Poor Sitting balance - Comments: Mod assist required at all times for sitting balance EOB. Heavy R lateral and posterior lean. Hands-on required at gait belt and at chest for truncal support.  Postural control: Right lateral lean;Posterior lean   Standing balance-Leahy Scale: Zero Standing balance comment: +2 required                             Cognition Arousal/Alertness: Awake/alert Behavior During Therapy: Impulsive Overall Cognitive Status: Impaired/Different from baseline Area of Impairment: Attention;Following commands;Safety/judgement;Awareness;Problem solving                   Current Attention Level: Focused   Following Commands: Follows one step commands inconsistently;Follows one step commands with increased time Safety/Judgement: Decreased awareness of deficits;Decreased awareness of safety Awareness: Intellectual Problem Solving: Difficulty sequencing;Requires verbal cues;Requires tactile cues;Slow processing;Decreased initiation General  Comments: pt able to follow 1 step commands inconsistently today, poor awareness to deficits with maximal redirection required for safety, and to avoid use of L UE        Exercises      General Comments        Pertinent Vitals/Pain Pain Assessment: No/denies pain Faces Pain Scale: No hurt    Home Living                      Prior Function            PT Goals (current goals can now be found in the care plan section) Acute Rehab PT Goals Patient Stated Goal: None stated this session PT Goal Formulation: Patient unable to participate in goal setting Time For Goal Achievement: 03/10/19 Potential to Achieve Goals: Good Progress towards PT goals: Progressing toward goals    Frequency    Min 3X/week      PT Plan Current plan remains appropriate    Co-evaluation              AM-PAC PT "6 Clicks" Mobility   Outcome Measure  Help needed turning from your back to your side while in a flat bed without using bedrails?: A Lot Help needed moving from lying on your back to sitting on the side of a flat bed without using bedrails?: A Lot Help needed moving to and from a bed to a chair (including a wheelchair)?: A Lot Help needed standing up from a chair using your arms (e.g., wheelchair or bedside chair)?: A Lot Help needed to walk in hospital room?: Total Help needed climbing 3-5 steps with a railing? : Total 6 Click Score: 10    End of Session Equipment Utilized During Treatment: Gait belt Activity Tolerance: Patient tolerated treatment well Patient left: in chair;with call bell/phone within reach;with chair alarm set Nurse Communication: Mobility status PT Visit Diagnosis: Muscle weakness (generalized) (M62.81);History of falling (Z91.81);Difficulty in walking, not elsewhere classified (R26.2);Pain Pain - Right/Left: Left Pain - part of body: Shoulder     Time: 4166-0630 PT Time Calculation (min) (ACUTE ONLY): 23 min  Charges:  $Therapeutic Activity: 23-37 mins                     Rolinda Roan, PT, DPT Acute Rehabilitation Services Pager: [phone] Office: [phone]    Thelma Comp 02/28/2019, 2:10  PM

## 2019-02-28 NOTE — Progress Notes (Signed)
Patient ID: Patrick Daniel, male   DOB: May 30, 1956, 63 y.o.   MRN: [id number]  This NP visited patient at the bedside as a follow up for palliative medicine needs and emotional support.  Patient appears tired today and according to nursing continues  Tolerating only  25% of his meals.   Again it is difficult to assess his psychiatric and cognitive status not knowing his baseline.  Family repeatedly reinforced the fact that the patient was living independently prior to this hospitalization  I was able to have his family come to the bedside to see the patient in order to help them make decisions regarding his care plan.  Continued conversation regarding current complicated medical situation.  Again I detailed for the family the multiple comorbidities and his high risk for decompensation. 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  We spoke that his oral intake is not adequate to support nutrition and hydration long-term.  He is high risk for decompensation.   I detailed the difference between an aggressive medical intervention path and a palliative comfort path.    MOST form and  Hard Choices booklet   reviewed   Plan remains to treat the treatable and disposition to SNF    Questions and concerns addressed.         Total time spent on the unit was 35 minutes     Greater than 50% of the time was spent in counseling and coordination of care  Discussed with Family Medicine/ Dr Erin Hearing  Wadie Lessen NP  Palliative Medicine Team Team Phone # [phone] Pager [phone]

## 2019-02-28 NOTE — Plan of Care (Signed)

## 2019-03-01 DIAGNOSIS — R1310 Dysphagia, unspecified: Secondary | ICD-10-CM

## 2019-03-01 LAB — GLUCOSE, CAPILLARY: Glucose-Capillary: 85 mg/dL (ref 70–99)

## 2019-03-01 MED ORDER — PRO-STAT SUGAR FREE PO LIQD
30.0000 mL | Freq: Two times a day (BID) | ORAL | Status: DC
  Administered 2019-03-01 – 2019-03-04 (×7): 30 mL via ORAL
  Filled 2019-03-01 (×7): qty 30

## 2019-03-01 NOTE — Progress Notes (Addendum)
Family Medicine Teaching Service Daily Progress Note Intern Pager: [phone]  Patient name: Patrick Daniel Medical record number: [id number] Date of birth: 12-Sep-1956 Age: 63 y.o. Gender: male  Primary Care Provider: Marco Collie, MD Consultants: ID Code Status: DNR   Pt Overview and Major Events to Date:  Hospital Day: 21 02/09/2019: admitted for weakness & AMS, neuro consulted, LP suggestive of HSV encephalitis, acyclovir started, 1/2 Blood cx positive for Coag neg Staph, CSF cx NGTD, CSF fungal NGTD, Anaerobic cx NGTD. Vanc (4/15-4/18) and Linezolid (4/18-4/21), cefepime 4/15-4/16). IV Acyclovir (4/15-5/7) 4/16 Cefepime discontinued  4/17 Repeat blood cultures  4/18 Vanc Discontinued, ID added linezolid 4/20 acute renal failure, nephro consulted, pt unable to keep NG down 4/25 1 unit pRBC transfusion  Assessment and Plan: Patrick Daniel a 63 y.o.male being treated for HSV-2 encephalopathy. PMH is significant forcervical dystonia, dysarthria, HLD, gait abnormality, h/o myelopathy.  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 I am able to understand him better when he speaks more slowly. He understands commands and is alert and oriented x 2 (knows he is in a hospital, name, says 2021 to year). Redirected to year and place.  Spoke to sister this morning at length about patient updates and potential disposition. She expresses that he was living on his own and he had speech difficulties at baseline due to cervical dystonia; however, she was still able to understand him. She had difficulty understanding him yesterday and reports he was far from his baseline. However, sister has not seen patient since prior to admission, so does not know what his progress has been since he arrived. Patient does not want to go to a SNF, but expresses some understanding that he has more rehab before he can live on his own again. Yesterday, he was able to stand up with  PT. Sister and daughter were also able to visit yesterday. Can also consider botox injections, which patient receives q 3 months (last 12/01/2018) for his cervical dystonia, which might be helpful for dysarthria and dysphagia rehab progression. Also consider re-imaging to see if any neuro changes since admit for long term prognosis and goal setting.   Tylenol PRN fever  Continue IV acyclovir, through 5/7  Continue speech rehabilitation  Continue dysphagia 1 diet  Labs q3 days, next labs 5/6  Urinary retention Plans for voiding trial on Wednesday 5/6 prior to DC. Continues to have indwelling catheter placed on 4/28. Increased flomax to 0.8 yesterday and Bps were stable.   Strict I's and O's   Increase Flomax 0.8 mg daily  Can consider adding finasteride if UOP not improved  Plans to d/c Foley on Wednesday for voiding trial   If drop in BP, d/c amlodipine   Hypernatremia, stable Sodium 145 on 5/3.  Likely due to low p.o. intake in the setting of dysphasia.  Continue BMP every 3 days  Anemia, stable Hemoglobin on 5/3 was 8.8.  Once weekly CBC  Hypertension, improved Patient is normotensive.   Continue amlodipine  Left humeral neck fracture  Tylenol as needed pain  Monitor for pain with daily physical exam  #FEN/GI: Dysphagia 1 w/ nectar thick liquids  Access: Rt PIV 5/2, Urethral catheter (4/28)  VTE prophylaxis: SCDs  Disposition: Reamin inpatient until 5/7 for IV acyclovir.  PT recs for SNF. Consulted SW for SNF placement.  Call Sister  ================================================================ Subjective:  NAEO.   Objective: Temp:  [97.9 F (36.6 C)-99.8 F (37.7 C)] 98.5 F (36.9 C) (05/05 0341) Pulse  Rate:  [101-125] 101 (05/05 0341) Resp:  [16-18] 16 (05/05 0341) BP: (100-139)/(69-88) 100/69 (05/05 0341) SpO2:  [96 %-100 %] 100 % (05/05 0341) Weight:  [58.2 kg] 58.2 kg (05/05 0500) 05/04 0701 - 05/05 0700 In: 1168.3 [IV  Piggyback:1168.3] Out: -    Physical Exam: General: NAD, non-toxic, well-appearing, sitting comfortably in bed. Asks for juice, drinks on his own. Cardiovascular: RRR, normal S1, S2. 2+ RP bilaterally. No BLEE.   Respiratory: CTAB. No IWOB.  Abdomen: + BS. NT, ND, soft to palpation.  Extremities: Warm and well perfused.     Laboratory: I have personally read and reviewed all labs and imaging studies.  CBC: Recent Labs  Lab 02/22/19 0846 02/23/19 0738 02/25/19 0327 02/27/19 0439  WBC 8.6 9.8 9.4 10.1  HGB 8.7* 8.6* 8.1* 8.8*  HCT 25.7* 25.7* 24.3* 26.6*  MCV 93.8 94.5 96.0 96.0  PLT 442* 441* 493* 476*   CMP: Recent Labs  Lab 02/23/19 0738 02/24/19 0418 02/25/19 0327 02/27/19 0439  NA 144 146* 147* 145  K 3.7 3.3* 3.7 3.8  CL 111 112* 114* 109  CO2 23 24 23 24   GLUCOSE 108* 112* 107* 120*  BUN 18 21* 20 25*  CREATININE 0.64 0.70 0.74 0.84  CALCIUM 8.8* 8.8* 8.8* 9.2     GFR: Estimated Creatinine Clearance: 77 mL/min (by C-G formula based on SCr of 0.84 mg/dL).  CBG: Recent Labs  Lab 02/27/19 0327 02/27/19 0740 02/27/19 1128 02/27/19 1622 02/27/19 1905  GLUCAP 127* 105* 100* 126* 114*    Imaging/Diagnostic Tests: No results found.   Wilber Oliphant, MD 03/01/2019, 6:30 AM PGY-1, [place] Intern pager: [phone], text pages welcome

## 2019-03-01 NOTE — Progress Notes (Signed)
Inpatient Rehabilitation-Admissions Coordinator   Please see note by PM&R PA Reesa Chew. Pt is not an appropriate candidate for CIR. AC will sign off and contact CM/SW for SNF recommendation.   Jhonnie Garner, OTR/L  Rehab Admissions Coordinator  [phone] 03/01/2019 1:16 PM

## 2019-03-01 NOTE — Progress Notes (Signed)
  Speech Language Pathology Treatment: Cognitive-Linquistic(Dysarthria)  Patient Details Name: Patrick Daniel MRN: [id number] DOB: [birth year] Today's Date: 03/01/2019 Time: 4097-3532 SLP Time Calculation (min) (ACUTE ONLY): 15 min  Assessment / Plan / Recommendation Clinical Impression  Pt was seen for dysarthria treatment and reported that he recalled working with this SLP prior. He was again educated regarding compensatory strategies for speech intelligibility and this session also focused on pt's use of a louder vocal intensity due to his difficulty simulataneously implementing more than one strategy. Today he used this strategy at the word level with 40% accuracy increasing to 100% accuracy with min-moderate cues. Pt continues to mod-max cues at the phrase level but was noted to occasionally self-correct for use of an increased voca intensity during structured tasks. SLP will continue to follow pt.    HPI HPI: Patrick Daniel is a 63 y.o. male presenting with history of paraplegia, myelopathy, dysarthria, cervical dystonia, (?CNS degenerative disorder )ad[facility] with AMS after being found down. 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 This distribution may also be seen in infection, particularly herpes encephalitis, though this seems less likely in this presentation. Sustained  aleft humerus fx, rhabdomyolsis. 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      SLP Plan  Continue with current plan of care       Recommendations                   Oral Care Recommendations: Oral care BID Follow up Recommendations: Skilled Nursing facility SLP Visit Diagnosis: Dysarthria and anarthria (R47.1) Plan: Continue with current plan of care       Duel Conrad I. Hardin Negus, [place], [place] Office number [phone] Pager  [place] 03/01/2019, 3:23 PM

## 2019-03-01 NOTE — Progress Notes (Signed)
Nutrition Follow-up   RD working remotely.  DOCUMENTATION CODES:   Not applicable  INTERVENTION:  Provide Magic cup TID with meals, each supplement provides 290 kcal and 9 grams of protein.  Provide 30 ml Prostat po BID, each supplement provides 100 kcal and 15 grams of protein.   Encourage adequate PO intake.   NUTRITION DIAGNOSIS:   Inadequate oral intake related to inability to eat as evidenced by NPO status; diet advanced; improving   GOAL:   Patient will meet greater than or equal to 90% of their needs; progressing  MONITOR:   PO intake, Supplement acceptance, Labs, Weight trends, I & O's, Skin  REASON FOR ASSESSMENT:   Consult Enteral/tube feeding initiation and management  ASSESSMENT:   63 y.o. male presenting with AMS after being found down. PMH is significant for cervical dystonia, dysarthria, HLD, gait abnormality, h/o myelopathy. 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  Pt continues on a dysphagia 1 diet with nectar thick liquids. Meal completion 50%. Intake has been improving. Will continue with Magic up at meals to aid in caloric and protein needs. Will additionally order Prostat to aid in adequate protein. Labs and medications reviewed.   Diet Order:   Diet Order            DIET - DYS 1 Room service appropriate? Yes; Fluid consistency: Nectar Thick  Diet effective now              EDUCATION NEEDS:   Not appropriate for education at this time  Skin:  Skin Assessment: Reviewed RN Assessment  Last BM:  5/3  Height:   Ht Readings from Last 1 Encounters:  02/11/19 5\' 6"  (1.676 m)    Weight:   Wt Readings from Last 1 Encounters:  03/01/19 58.2 kg    Ideal Body Weight:  64.5 kg  BMI:  Body mass index is 20.71 kg/m.  Estimated Nutritional Needs:   Kcal:  6945-0388  Protein:  90-100 grams  Fluid:  1.9 - 2.1 L/day    Corrin Parker, MS, RD, LDN Pager # [phone] After hours/ weekend pager # [phone]

## 2019-03-01 NOTE — Progress Notes (Signed)
Thank you for consult on Patrick Daniel. Chart reviewed and therapy notes reviewed. Patient making minimal gains and SNF recommended to help provide level of therapy appropriate at this time. Will defer CIR consult.

## 2019-03-02 ENCOUNTER — Ambulatory Visit: Payer: Medicare Other | Admitting: Neurology

## 2019-03-02 LAB — CBC
HCT: 31 % — ABNORMAL LOW (ref 39.0–52.0)
Hemoglobin: 10 g/dL — ABNORMAL LOW (ref 13.0–17.0)
MCH: 31.6 pg (ref 26.0–34.0)
MCHC: 32.3 g/dL (ref 30.0–36.0)
MCV: 98.1 fL (ref 80.0–100.0)
Platelets: 488 10*3/uL — ABNORMAL HIGH (ref 150–400)
RBC: 3.16 MIL/uL — ABNORMAL LOW (ref 4.22–5.81)
RDW: 15 % (ref 11.5–15.5)
WBC: 7 10*3/uL (ref 4.0–10.5)
nRBC: 0 % (ref 0.0–0.2)

## 2019-03-02 LAB — BASIC METABOLIC PANEL
Anion gap: 11 (ref 5–15)
Anion gap: 10 (ref 5–15)
BUN: 30 mg/dL — ABNORMAL HIGH (ref 6–20)
BUN: 33 mg/dL — ABNORMAL HIGH (ref 6–20)
CO2: 24 mmol/L (ref 22–32)
CO2: 25 mmol/L (ref 22–32)
Calcium: 9 mg/dL (ref 8.9–10.3)
Calcium: 9 mg/dL (ref 8.9–10.3)
Chloride: 115 mmol/L — ABNORMAL HIGH (ref 98–111)
Chloride: 113 mmol/L — ABNORMAL HIGH (ref 98–111)
Creatinine, Ser: 0.78 mg/dL (ref 0.61–1.24)
Creatinine, Ser: 0.86 mg/dL (ref 0.61–1.24)
GFR calc Af Amer: >60 mL/min (ref >60)
GFR calc Af Amer: >60 mL/min (ref >60)
GFR calc non Af Amer: >60 mL/min (ref >60)
GFR calc non Af Amer: >60 mL/min (ref >60)
Glucose, Bld: 128 mg/dL — ABNORMAL HIGH (ref 70–99)
Glucose, Bld: 132 mg/dL — ABNORMAL HIGH (ref 70–99)
Potassium: 3.4 mmol/L — ABNORMAL LOW (ref 3.5–5.1)
Potassium: 3.9 mmol/L (ref 3.5–5.1)
Sodium: 150 mmol/L — ABNORMAL HIGH (ref 135–145)
Sodium: 148 mmol/L — ABNORMAL HIGH (ref 135–145)

## 2019-03-02 LAB — CULTURE, FUNGUS WITHOUT SMEAR: Special Requests: NORMAL

## 2019-03-02 LAB — SARS CORONAVIRUS 2 BY RT PCR (HOSPITAL ORDER, PERFORMED IN ~~LOC~~ HOSPITAL LAB): SARS Coronavirus 2: NEGATIVE

## 2019-03-02 MED ORDER — DEXTROSE 5 % IV SOLN
INTRAVENOUS | Status: DC
  Administered 2019-03-02 – 2019-03-03 (×3): via INTRAVENOUS

## 2019-03-02 MED ORDER — POTASSIUM CHLORIDE 20 MEQ PO PACK
40.0000 meq | PACK | Freq: Once | ORAL | Status: AC
  Administered 2019-03-02: 40 meq via ORAL
  Filled 2019-03-02: qty 2

## 2019-03-02 MED ORDER — DEXTROSE 5 % IV SOLN
10.0000 mg/kg | Freq: Three times a day (TID) | INTRAVENOUS | Status: AC
  Administered 2019-03-02 – 2019-03-03 (×5): 555 mg via INTRAVENOUS
  Filled 2019-03-02 (×3): qty 11.1
  Filled 2019-03-02: qty 10
  Filled 2019-03-02: qty 11.1

## 2019-03-02 NOTE — Progress Notes (Signed)
  Speech Language Pathology Treatment: Dysphagia;Cognitive-Linquistic  Patient Details Name: Patrick Daniel MRN: [id number] DOB: 01-Feb-1956 Today's Date: 03/02/2019 Time: 5498-2641 SLP Time Calculation (min) (ACUTE ONLY): 23 min  Assessment / Plan / Recommendation Clinical Impression  Patrick Daniel seen for speech intelligibility, and use of low tech augmentative communication. Overall, writing is not a functional alternative to express thoughts. Wrote his name and several words to dictation with dysgraphia. He was able to spell words (mostly accurate) with alphabet board when requested ("spell apple" etc) however do not feel he has cognitive ability to locate the board and use it to independently spell words to convey his thoughts. Cognitively he struggles to sustain attention for short periods and impairments in reasoning, problem solving and awareness are highly suspected but difficult to formally assess due to dysarthria. It is doubtful Patrick Daniel will be able to comprehend/process conversations re: his desires for his care.   Speech intelligibility was mildly improved. Independently he increased vocal intensity several times and has been working on amplification with SLP colleague. Three-four of pt's phrases/requests were intelligible this session which is an improvement.   Brief observation with nectar thick tea due to pt's toileting request. Swallow function appears baseline per prior MBS with multiple swallows and decreased coordination. No immediate cough, throat clear. Suspect with degenerative disorder swallow function may not improve significantly.    HPI HPI: Patrick Daniel is a 63 y.o. male presenting with history of paraplegia, myelopathy, dysarthria, cervical dystonia, (?CNS degenerative disorder )admitted from [facility] living facility with AMS after being found down. 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 This  distribution may also be seen in infection, particularly herpes encephalitis, though this seems less likely in this presentation. Sustained  aleft humerus fx, rhabdomyolsis. 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      SLP Plan  Continue with current plan of care       Recommendations  Diet recommendations: Dysphagia 1 (puree);Nectar-thick liquid Liquids provided via: Cup;Straw Medication Administration: Crushed with puree Supervision: Staff to assist with self feeding;Full supervision/cueing for compensatory strategies Compensations: Minimize environmental distractions;Slow rate;Small sips/bites;Lingual sweep for clearance of pocketing;Multiple dry swallows after each bite/sip Postural Changes and/or Swallow Maneuvers: Seated upright 90 degrees;Upright 30-60 min after meal                Oral Care Recommendations: Oral care BID Follow up Recommendations: Skilled Nursing facility SLP Visit Diagnosis: Dysarthria and anarthria (R47.1);Dysphagia, unspecified (R13.10) Plan: Continue with current plan of care                       Patrick Daniel 03/02/2019, 4:14 PM  Patrick Daniel.Ed Risk analyst [phone] Office [phone]

## 2019-03-02 NOTE — Care Management Important Message (Signed)
Important Message  Patient Details  Name: Patrick Daniel MRN: [id number] Date of Birth: January 25, 1956   Medicare Important Message Given:  Yes    Cleopatra Sardo [address] 03/02/2019, 10:38 AM

## 2019-03-02 NOTE — Progress Notes (Signed)
Physical Therapy Treatment Patient Details Name: Patrick Daniel MRN: [id number] DOB: January 22, 1956 Today's Date: 03/02/2019    History of Present Illness 63 y.o. male presenting with AMS after being found down at [facility]. PMH is significant for cervical dystonia, dysarthria, HLD, gait abnormality (?CNS degenerative disorder?). Patient unable to provide insight to what caused fall given chronic speech issues that have acutely worsened. Known mobility issues at baseline with frequent falls. Concern for traumatic head injury after fall vs stroke vs seizure. Head CT with temporal lobe edema. MRI with concern for acute or early subacute infarct vs infection like HSV. Pt also found to have comminuted fx of L humeral neck, ortho following.     PT Comments    Focus of session was balance and trunk stabilization while sitting at sink for ADL activity (on [place]). Pt with improved balance this session however as pt fatigued, up to max assist required to maintain upright posture. Will continue to follow and progress as able per POC.     Follow Up Recommendations  SNF;Supervision/Assistance - 24 hour     Equipment Recommendations  Other (comment)(TBD by next venue of care)    Recommendations for Other Services       Precautions / Restrictions Precautions Precautions: Fall;Shoulder Type of Shoulder Precautions: sling, NWB  Shoulder Interventions: Shoulder sling/immobilizer Precaution Comments: impulsive Required Braces or Orthoses: Sling Restrictions Weight Bearing Restrictions: Yes LUE Weight Bearing: Non weight bearing    Mobility  Bed Mobility Overal bed mobility: Needs Assistance Bed Mobility: Supine to Sit     Supine to sit: Mod assist;HOB elevated Sit to supine: Mod assist;+2 for physical assistance   General bed mobility comments: Pt initiating movement towards EOB however continues to require assist for trunk elevation to full sitting position.   Transfers Overall transfer level: Needs  assistance Equipment used: 2 person hand held assist Transfers: Sit to/from Stand Sit to Stand: Mod assist;+2 physical assistance         General transfer comment: [name] utilized for sit<>stand. Assist required to keep LUE from reaching towards rail to hold on  Ambulation/Gait             General Gait Details: Not able to progress ambulation this session.    Stairs             Wheelchair Mobility    Modified Rankin (Stroke Patients Only)       Balance Overall balance assessment: Needs assistance Sitting-balance support: Feet supported Sitting balance-Leahy Scale: Poor Sitting balance - Comments: Mod assist required at all times for sitting balance EOB. Heavy R lateral and posterior lean. Hands-on required at gait belt and at chest for truncal support.  Postural control: Right lateral lean;Posterior lean   Standing balance-Leahy Scale: Poor Standing balance comment: Statically pt was able to maintain standing with +2 min assist.                             Cognition Arousal/Alertness: Awake/alert Behavior During Therapy: Impulsive Overall Cognitive Status: Impaired/Different from baseline Area of Impairment: Attention;Following commands;Safety/judgement;Awareness;Problem solving                   Current Attention Level: Focused   Following Commands: Follows one step commands inconsistently;Follows one step commands with increased time Safety/Judgement: Decreased awareness of deficits;Decreased awareness of safety Awareness: Intellectual Problem Solving: Difficulty sequencing;Requires verbal cues;Requires tactile cues;Slow processing;Decreased initiation General Comments: pt able to follow 1 step commands inconsistently today,  poor awareness to deficits with maximal redirection required for safety, and to avoid use of L UE       Exercises      General Comments        Pertinent Vitals/Pain Pain Assessment: No/denies pain Faces  Pain Scale: No hurt Pain Location: Occasional grimacing with mobility Pain Intervention(s): Monitored during session    Home Living                      Prior Function            PT Goals (current goals can now be found in the care plan section) Acute Rehab PT Goals Patient Stated Goal: None stated this session PT Goal Formulation: Patient unable to participate in goal setting Time For Goal Achievement: 03/10/19 Potential to Achieve Goals: Good Progress towards PT goals: Progressing toward goals    Frequency    Min 3X/week      PT Plan Current plan remains appropriate    Co-evaluation PT/OT/SLP Co-Evaluation/Treatment: Yes Reason for Co-Treatment: Complexity of the patient's impairments (multi-system involvement);Necessary to address cognition/behavior during functional activity;For patient/therapist safety;To address functional/ADL transfers PT goals addressed during session: Mobility/safety with mobility;Balance;Strengthening/ROM        AM-PAC PT "6 Clicks" Mobility   Outcome Measure  Help needed turning from your back to your side while in a flat bed without using bedrails?: A Lot Help needed moving from lying on your back to sitting on the side of a flat bed without using bedrails?: A Lot Help needed moving to and from a bed to a chair (including a wheelchair)?: A Lot Help needed standing up from a chair using your arms (e.g., wheelchair or bedside chair)?: A Lot Help needed to walk in hospital room?: Total Help needed climbing 3-5 steps with a railing? : Total 6 Click Score: 10    End of Session Equipment Utilized During Treatment: Gait belt Activity Tolerance: Patient tolerated treatment well Patient left: in chair;with call bell/phone within reach;with chair alarm set Nurse Communication: Mobility status PT Visit Diagnosis: Muscle weakness (generalized) (M62.81);History of falling (Z91.81);Difficulty in walking, not elsewhere classified  (R26.2);Pain Pain - Right/Left: Left Pain - part of body: Shoulder     Time: 0822-0853 PT Time Calculation (min) (ACUTE ONLY): 31 min  Charges:  $Therapeutic Activity: 8-22 mins                     Rolinda Roan, PT, DPT Acute Rehabilitation Services Pager: [phone] Office: [phone]    Thelma Comp 03/02/2019, 9:52 AM

## 2019-03-02 NOTE — Progress Notes (Signed)
Physical Therapy Treatment Patient Details Name: Patrick Daniel MRN: [id number] DOB: [birth year] Today's Date: 03/02/2019    History of Present Illness 63 y.o. male presenting with AMS after being found down at [place]. PMH is significant for cervical dystonia, dysarthria, HLD, gait abnormality (?CNS degenerative disorder?). Patient unable to provide insight to what caused fall given chronic speech issues that have acutely worsened. Known mobility issues at baseline with frequent falls. Concern for traumatic head injury after fall vs stroke vs seizure. Head CT with temporal lobe edema. MRI with concern for acute or early subacute infarct vs infection like HSV. Pt also found to have comminuted fx of L humeral neck, ortho following.     PT Comments    PT saw for second time this morning after entering room to address a chair alarm. Pt scooted all the way down in chair with shoulders off R side of chair and legs off L side of chair. Noted pt pulling on catheter. +3 assist required to safely get pt repositioned in bed. Extra pillow placed behind R shoulder to discourage R lateral lean. NT notified that pt pulling on catheter who was to alert RN (no mitts on today). Will continue to follow and progress as able per POC.   Follow Up Recommendations  SNF;Supervision/Assistance - 24 hour     Equipment Recommendations  Other (comment)(TBD by next venue of care)    Recommendations for Other Services       Precautions / Restrictions Precautions Precautions: Fall;Shoulder Type of Shoulder Precautions: sling, NWB  Shoulder Interventions: Shoulder sling/immobilizer Precaution Comments: impulsive Required Braces or Orthoses: Sling Restrictions Weight Bearing Restrictions: Yes LUE Weight Bearing: Non weight bearing    Mobility  Bed Mobility Overal bed mobility: Needs Assistance Bed Mobility: Supine to Sit     Supine to sit: Mod assist;HOB elevated Sit to supine: Mod assist;+2 for physical assistance   General bed mobility comments: Pt received sitting in recliner this session  Transfers Overall transfer level: Needs assistance Equipment used: 2 person hand held assist Transfers: Sit to/from Stand Sit to Stand: Mod assist;+2 physical assistance         General transfer comment: Not tested  Ambulation/Gait             General Gait Details: Not able to progress to gait training    Stairs             Wheelchair Mobility    Modified Rankin (Stroke Patients Only)       Balance Overall balance assessment: Needs assistance Sitting-balance support: Feet supported Sitting balance-Leahy Scale: Poor Sitting balance - Comments: Mod assist required at all times for sitting balance EOB. Heavy R lateral and posterior lean. Hands-on required at gait belt and at chest for truncal support.  Postural control: Right lateral lean;Posterior lean   Standing balance-Leahy Scale: Poor Standing balance comment: Statically pt was able to maintain standing with +2 min assist.                             Cognition Arousal/Alertness: Awake/alert Behavior During Therapy: Impulsive Overall Cognitive Status: Impaired/Different from baseline Area of Impairment: Attention;Following commands;Safety/judgement;Awareness;Problem solving                   Current Attention Level: Focused   Following Commands: Follows one step commands inconsistently;Follows one step commands with increased time Safety/Judgement: Decreased awareness of deficits;Decreased awareness of safety Awareness: Intellectual Problem Solving: Difficulty sequencing;Requires verbal cues;Requires  tactile cues;Slow processing;Decreased initiation General Comments: pt able to follow 1 step commands inconsistently today, poor awareness to deficits with maximal redirection required for safety, and to avoid use of L UE       Exercises      General Comments        Pertinent Vitals/Pain Pain  Assessment: Faces Faces Pain Scale: Hurts little more Pain Location: LLE with attempt to roll Pain Intervention(s): Monitored during session;Repositioned    Home Living                      Prior Function            PT Goals (current goals can now be found in the care plan section) Acute Rehab PT Goals Patient Stated Goal: None stated this session PT Goal Formulation: Patient unable to participate in goal setting Time For Goal Achievement: 03/10/19 Potential to Achieve Goals: Good Progress towards PT goals: Progressing toward goals    Frequency    Min 3X/week      PT Plan Current plan remains appropriate    Co-evaluation PT/OT/SLP Co-Evaluation/Treatment: Yes Reason for Co-Treatment: Complexity of the patient's impairments (multi-system involvement);Necessary to address cognition/behavior during functional activity;For patient/therapist safety;To address functional/ADL transfers PT goals addressed during session: Mobility/safety with mobility;Balance;Strengthening/ROM        AM-PAC PT "6 Clicks" Mobility   Outcome Measure  Help needed turning from your back to your side while in a flat bed without using bedrails?: A Lot Help needed moving from lying on your back to sitting on the side of a flat bed without using bedrails?: A Lot Help needed moving to and from a bed to a chair (including a wheelchair)?: A Lot Help needed standing up from a chair using your arms (e.g., wheelchair or bedside chair)?: A Lot Help needed to walk in hospital room?: Total Help needed climbing 3-5 steps with a railing? : Total 6 Click Score: 10    End of Session Equipment Utilized During Treatment: Gait belt Activity Tolerance: Patient tolerated treatment well Patient left: in chair;with call bell/phone within reach;with chair alarm set Nurse Communication: Mobility status PT Visit Diagnosis: Muscle weakness (generalized) (M62.81);History of falling (Z91.81);Difficulty in walking,  not elsewhere classified (R26.2);Pain Pain - Right/Left: Left Pain - part of body: Shoulder     Time: 9794-8016 PT Time Calculation (min) (ACUTE ONLY): 12 min  Charges:  $Therapeutic Activity: 8-22 mins                     Rolinda Roan, PT, DPT Acute Rehabilitation Services Pager: [phone] Office: [phone]    Thelma Comp 03/02/2019, 9:57 AM

## 2019-03-02 NOTE — Progress Notes (Signed)
Patient ID: Patrick Daniel, male   DOB: 1956-01-11, 63 y.o.   MRN: [id number]  This NP visited patient at the bedside as a follow up for palliative medicine needs and emotional support.  Patient is more alert today and working with OT at the sink!    Again it is difficult to assess his psychiatric and cognitive status not knowing his baseline.  Family repeatedly reinforced the fact that the patient was living independently prior to this hospitalization  I spoke once again with his sister Dolores Hoose by phone and she voiced her concerns and suggestions regarding alternative methods of communication, i.e. writing or computer board.  I discussed with speech therapy  Family acknowledges that plan on discharge is to a skilled nursing facility.  We discussed his high risk for decompensation and overall concerns regarding his multiple comorbidities.  Emotional support offered   Questions and concerns addressed.         Total time spent on the unit was 25 minutes     Greater than 50% of the time was spent in counseling and coordination of care  Discussed with [place] NP  Palliative Medicine Team Team Phone # [phone] Pager [phone]

## 2019-03-02 NOTE — Progress Notes (Signed)
Pharmacy Antibiotic Note  Patrick Daniel is a 64 y.o. 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  Pharmacy has been consulted for acyclovir IV dosing.  Patient with resolved AKI (likely multifactorial due to acyclovir IV therapy, vancomycin, dehydration and rhabdo on admit). SCr recovered 3.42>>0.70>0.84>0.78, estimated CrCl ~77 ml/min.   Plan: Continue acyclovir 10mg /kg IV q8h, through 5/7  Height: 5\' 6"  (167.6 cm) Weight: 122 lb 9.2 oz (55.6 kg) IBW/kg (Calculated) : 63.8  Temp (24hrs), Avg:98.3 F (36.8 C), Min:97.5 F (36.4 C), Max:99 F (37.2 C)  Recent Labs  Lab 02/24/19 0418 02/25/19 0327 02/27/19 0439 03/02/19 0553  WBC  --  9.4 10.1 7.0  CREATININE 0.70 0.74 0.84 0.78    Estimated Creatinine Clearance: 77.2 mL/min (by C-G formula based on SCr of 0.78 mg/dL).    Allergies  Allergen Reactions  . Baclofen Other (See Comments)    Weakness, drowsiness   Antimicrobial Therapy: Vanc 4/15>>4/18 Cefepime 4/15>>4/16 Acyclovir 4/15 >> (5/7) Linezolid (per ID) 4/19>> 4/21  Microbiology: 4/15 BCx: CoNS 4/15 CSF: ngF (no fungals x 19 days) 4/17 BCx: ngF 4/15 LP: HSV2+  4/27 COVID test negative 4/20 urine: ngF  Vertis Kelch, PharmD PGY1 Pharmacy Resident Phone [phone] 03/02/2019       8:03 AM

## 2019-03-02 NOTE — Progress Notes (Signed)
Occupational Therapy Treatment Patient Details Name: Patrick Daniel MRN: [id number] DOB: 04/30/1956 Today's Date: 03/02/2019    History of present illness 63 y.o. male presenting with AMS after being found down at [facility]. PMH is significant for cervical dystonia, dysarthria, HLD, gait abnormality (?CNS degenerative disorder?). Patient unable to provide insight to what caused fall given chronic speech issues that have acutely worsened. Known mobility issues at baseline with frequent falls. Concern for traumatic head injury after fall vs stroke vs seizure. Head CT with temporal lobe edema. MRI with concern for acute or early subacute infarct vs infection like HSV. Pt also found to have comminuted fx of L humeral neck, ortho following.    OT comments  Patient completing grooming tasks seated at sink on stedy.  Continues to require min-mod assist for seated balance, increased to max assist at times with fatigue, but able to complete grooming tasks with supervision to brush teeth and wash face.   Max cueing for adherence to precautions of L UE throughout session, found with sling off upon entering room this morning.  Will follow.    Follow Up Recommendations  SNF;Supervision/Assistance - 24 hour    Equipment Recommendations  Other (comment)(TBD at next venue of care)    Recommendations for Other Services      Precautions / Restrictions Precautions Precautions: Fall;Shoulder Type of Shoulder Precautions: sling, NWB  Shoulder Interventions: Shoulder sling/immobilizer Precaution Comments: impulsive Required Braces or Orthoses: Sling Restrictions Weight Bearing Restrictions: Yes LUE Weight Bearing: Non weight bearing       Mobility Bed Mobility Overal bed mobility: Needs Assistance Bed Mobility: Supine to Sit     Supine to sit: Mod assist;HOB elevated Sit to supine: Mod assist;+2 for physical assistance   General bed mobility comments: transitions towards L side of bed with mod assist for  trunk +2 for safety   Transfers Overall transfer level: Needs assistance Equipment used: Ambulation equipment used Transfers: Sit to/from Stand Sit to Stand: Mod assist;+2 physical assistance         General transfer comment: sit to stand using stedy, assist required to avoid use of L UE, for balance and physical assist to ascend     Balance Overall balance assessment: Needs assistance Sitting-balance support: Feet supported Sitting balance-Leahy Scale: Poor Sitting balance - Comments: min-mod assist required for sitting balance  Postural control: Right lateral lean;Posterior lean Standing balance support: Single extremity supported;During functional activity Standing balance-Leahy Scale: Poor Standing balance comment: relaint on external support                            ADL either performed or assessed with clinical judgement   ADL Overall ADL's : Needs assistance/impaired Eating/Feeding: Supervision/ safety Eating/Feeding Details (indicate cue type and reason): direct supervision drinking nectar thick milk with cueing for swallowing  Grooming: Wash/dry face;Oral care;Minimal assistance;Sitting;Moderate assistance Grooming Details (indicate cue type and reason): min-modA for sitting balance; seated on Stedy seat pads at sink during task completion; cues to use only RUE to assist with task                  Toilet Transfer: Moderate assistance;+2 for physical assistance;+2 for safety/equipment;Cueing for sequencing;Cueing for safety;BSC Toilet Transfer Details (indicate cue type and reason): using stedy         Functional mobility during ADLs: Moderate assistance;+2 for physical assistance;+2 for safety/equipment(use of stedy) General ADL Comments: continues to require max cueing to avoid using L UE during  all tasks      Vision       Perception     Praxis      Cognition Arousal/Alertness: Awake/alert Behavior During Therapy: Impulsive Overall  Cognitive Status: Impaired/Different from baseline Area of Impairment: Attention;Following commands;Safety/judgement;Awareness;Problem solving                   Current Attention Level: Focused   Following Commands: Follows one step commands inconsistently;Follows one step commands with increased time Safety/Judgement: Decreased awareness of deficits;Decreased awareness of safety Awareness: Intellectual Problem Solving: Difficulty sequencing;Requires verbal cues;Requires tactile cues;Slow processing;Decreased initiation General Comments: pt able to follow 1 step commands inconsistently today, poor awareness to deficits with maximal redirection required for safety, and to avoid use of L UE         Exercises Exercises: Hand exercises Hand Exercises Forearm Supination: AROM;10 reps Forearm Pronation: AROM;10 reps Wrist Flexion: AROM;Left;10 reps Wrist Extension: AROM;Left;10 reps Digit Composite Flexion: AROM;Left;10 reps Composite Extension: AROM;Left;10 reps   Shoulder Instructions       General Comments      Pertinent Vitals/ Pain       Pain Assessment: Faces Faces Pain Scale: Hurts little more Pain Location: LLE with attempt to roll Pain Intervention(s): Monitored during session  Home Living                                          Prior Functioning/Environment              Frequency  Min 3X/week        Progress Toward Goals  OT Goals(current goals can now be found in the care plan section)  Progress towards OT goals: Progressing toward goals  Acute Rehab OT Goals Patient Stated Goal: None stated this session  Plan Discharge plan remains appropriate    Co-evaluation    PT/OT/SLP Co-Evaluation/Treatment: Yes Reason for Co-Treatment: Complexity of the patient's impairments (multi-system involvement);Necessary to address cognition/behavior during functional activity;For patient/therapist safety;To address functional/ADL  transfers PT goals addressed during session: Mobility/safety with mobility;Balance;Strengthening/ROM OT goals addressed during session: ADL's and self-care      AM-PAC OT "6 Clicks" Daily Activity     Outcome Measure   Help from another person eating meals?: A Lot Help from another person taking care of personal grooming?: A Lot Help from another person toileting, which includes using toliet, bedpan, or urinal?: Total Help from another person bathing (including washing, rinsing, drying)?: A Lot Help from another person to put on and taking off regular upper body clothing?: A Lot Help from another person to put on and taking off regular lower body clothing?: Total 6 Click Score: 10    End of Session Equipment Utilized During Treatment: Gait belt  OT Visit Diagnosis: Muscle weakness (generalized) (M62.81);Other symptoms and signs involving cognitive function;History of falling (Z91.81)   Activity Tolerance Patient tolerated treatment well   Patient Left in chair;with call bell/phone within reach;with chair alarm set   Nurse Communication Mobility status        Time: 4782-9562 OT Time Calculation (min): 31 min  Charges: OT General Charges $OT Visit: 1 Visit OT Treatments $Self Care/Home Management : 8-22 mins  Delight Stare, OT Acute Rehabilitation Services Pager [phone] Office [place] 03/02/2019, 1:26 PM

## 2019-03-02 NOTE — Progress Notes (Signed)
Family Medicine Teaching Service Daily Progress Note Intern Pager: [phone]  Patient name: Patrick Daniel Medical record number: [id number] Date of birth: December 17, 1955 Age: 63 y.o. 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 PMH is significant forcervical dystonia, dysarthria, HLD, gait abnormality, h/o myelopathy.  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  Spoke to him more about SNF placement, continues to be reluctant but understands that he will need more rehab.  If patient starts to worsen or plateau, can consider doing Botox injections, which she receives outpatient for cervical dystonia or reimaging per ID recommendations on 02/16/2019.  Continue IV acyclovir through 5/7  Continue speech  Continue dysphagia 1 diet  Labs every 3 days, next labs 5/9 if patient is still in hospital  Urinary retention Voiding trial today.  Indwelling catheter placed on 4/28.  Increased Flomax to 0.8.    Strict I's and O's  Continue Flomax 0.8 mg daily  Monitor urine output  Can consider finasteride if urine output not improved  Hypernatremia, stable Sodium 150 on 5/6.  Likely due to low p.o. intake in the setting of dysphasia.  Continue BMP every 3 days  Start D5W 161ml/hr   Recheck Na at 2100  Anemia, stable Hemoglobin on 5/6 10.0  Once weekly CBC  Hypertension, improved Patient is normotensive.   Continue amlodipine  Left humeral neck fracture  Tylenol as needed pain  Monitor for pain with daily physical exam  #FEN/GI: Dysphagia 1  w/ nectar thick liquids  Access: Rt PIV 5/2  VTE prophylaxis: SCDs  Disposition: Remain inpatient until 5/7 for IV acyclovir. SNF ================================================================ Subjective:  NAEO.   Objective: Temp:  [97.5 F (36.4 C)-98.7 F (37.1 C)] 97.5 F (36.4 C) (05/06 0328) Pulse Rate:  [65-122] 108 (05/06 0328) Resp:  [16-18] 16 (05/06 0328) BP: (97-132)/(56-83) 97/56 (05/06 0328) SpO2:  [95 %-100 %] 100 % (05/06 0328) Weight:  [55.6 kg] 55.6 kg (05/06 0400) 05/05 0701 - 05/06 0700 In: 120 [P.O.:120] Out: 1475 [Urine:1475]   Physical Exam: General: NAD, non-toxic, well-appearing, sitting comfortably in chair.  Cardiovascular: RRR, normal S1, S2. 2+ RP bilaterally. No BLEE   Respiratory: CTAB. No IWOB.  Abdomen: + BS. NT, ND, soft to palpation.  Extremities: Warm and well perfused. Moving spontaneously.  Neuro: speech is not as understandable today. Patient pauses between thoughts. Seems to be more confused today. Gets mildly frustrated when asking him to repeat sentences.    Laboratory: I have personally read and reviewed all labs and imaging studies.  CBC: Recent Labs  Lab 02/23/19 0738 02/25/19 0327 02/27/19 0439  WBC 9.8 9.4 10.1  HGB 8.6* 8.1* 8.8*  HCT 25.7* 24.3* 26.6*  MCV 94.5 96.0 96.0  PLT 441* 493* 476*   CMP: Recent Labs  Lab 02/23/19 0738 02/24/19 0418 02/25/19 0327 02/27/19 0439  NA 144 146* 147* 145  K 3.7 3.3* 3.7 3.8  CL 111 112* 114* 109  CO2 23 24 23 24   GLUCOSE 108* 112* 107* 120*  BUN 18 21* 20 25*  CREATININE 0.64 0.70 0.74 0.84  CALCIUM 8.8* 8.8* 8.8* 9.2  GFR: Estimated Creatinine Clearance: 73.5 mL/min (by C-G formula based on SCr of 0.84 mg/dL).  CBG: Recent Labs  Lab 02/27/19 0740 02/27/19 1128 02/27/19 1622 02/27/19 1905 03/01/19 0728  GLUCAP 105* 100* 126* 114* 85    Imaging/Diagnostic Tests: No results found.   Wilber Oliphant, MD 03/02/2019, 6:16 AM PGY-1, [place] Intern pager: [phone], text pages welcome

## 2019-03-03 NOTE — Progress Notes (Signed)
  Speech Language Pathology Treatment: Cognitive-Linquistic  Patient Details Name: Patrick Daniel MRN: [id number] DOB: 03-25-1956 Today's Date: 03/03/2019 Time: 5726-2035 SLP Time Calculation (min) (ACUTE ONLY): 10 min  Assessment / Plan / Recommendation Clinical Impression  Pt participated in an abbreviated treatment session due to his difficulty maintaining an adequate level of alertness to participate. He required moderate-maximum cues to use an increased vocal intensity during production of short phrases. He demonstrated 0% accuracy with identification of pictures by function from a field of four. The session was ultimately terminated due to his inability to maintain adequate alertness. SLP will continue to follow pt.    HPI HPI: Patrick Daniel is a 63 y.o. male presenting with history of paraplegia, myelopathy, dysarthria, cervical dystonia, (?CNS degenerative disorder )admitted from indpendendent living facility with AMS after being found down. 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 This distribution may also be seen in infection, particularly herpes encephalitis, though this seems less likely in this presentation. Sustained  aleft humerus fx, rhabdomyolsis. 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      SLP Plan  Continue with current plan of care       Recommendations                   Oral Care Recommendations: Oral care BID Follow up Recommendations: Skilled Nursing facility SLP Visit Diagnosis: Dysarthria and anarthria (R47.1) Plan: Continue with current plan of care       Sharese Manrique I. Hardin Negus, [place], [place] Office number [phone] Pager [phone]               Horton Marshall 03/03/2019, 3:06 PM

## 2019-03-03 NOTE — Progress Notes (Addendum)
Family Medicine Teaching Service Daily Progress Note Intern Pager: [phone]  Patient name: Patrick Daniel Medical record number: [id number] Date of birth: 1956/10/15 Age: 63 y.o. 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 PMH is significant forcervical dystonia, dysarthria, HLD, gait abnormality, h/o myelopathy.  # HSV 2 encephalitis  With severely worsened dysarthria (improving) and dysphagia from baseline.  Continues to work with speech and improved.  Patient will finish his last IV dose of IV acyclovir 5/8 @ 0600.  After this dose, he will be medically stable for discharge to skilled nursing facility. Patient is sleepy this morning. He reports he didn't sleep well last night. He is able to answer some questions. Grossly neurologically intact. Follow closely today for acute changes in mental status.   Continue IV acyclovir through 5/7  Continue speech  Continue dysphagia 1 diet  Labs every 3 days, next labs 5/9 if patient is still in hospital  F/u status later today. Monitor vitals   Urinary retention Voiding trial today.  Indwelling catheter placed on 4/28.  Increased Flomax to 0.8.  CT on 02/09/2019 shows unremarkable prostate.  Patient is not on any anticholinergic medications.  Should also consider neurogenic cause with recent encephalitis resulting injury.  Strict I's and O's  Continue Flomax 0.8 mg daily  Monitor urine output  Patient will need outpatient follow-up with urology  Continue Foley  Hypernatremia, stable Likely due to poor p.o. intake.  Repeat BMP with sodium at 148 yesterday.   Improved with D5W.  Continue BMP every 3 days  Continue D5W 175ml/hr   Anemia, stable Hemoglobin on 5/6 10.0  Once weekly CBC  Hypertension, improved Patient is normotensive.   Continue amlodipine  Left humeral neck fracture  Tylenol as needed pain  Monitor for pain with daily physical exam  #FEN/GI: Dysphagia 1 w/ nectar thick liquids  Access: Rt PIV 5/2  VTE prophylaxis: SCDs  Disposition: Remain inpatient until 5/7 for IV acyclovir. SNF ================================================================ Subjective:  Required foley. Unable to tell me if he felt the need to void yesterday.   Objective: Temp:  [98 F (36.7 C)-98.9 F (37.2 C)] 98.2 F (36.8 C) (05/07 0500) Pulse Rate:  [105-124] 105 (05/07 0500) Resp:  [15-18] 18 (05/07 0500) BP: (104-128)/(66-88) 120/75 (05/07 0500) SpO2:  [98 %-100 %] 100 % (05/07 0500) 05/06 0701 - 05/07 0700 In: 1784.7 [P.O.:720; I.V.:731.4; IV Piggyback:333.3] Out: 625 [Urine:625]   Physical Exam: General: NAD, non-toxic, well-appearing, sitting comfortably in bed. Very sleepy today.   Cardiovascular: RRR, normal S1, S2. 2+ RP bilaterally.  No BLEE Respiratory: CTAB. No IWOB.  Abdomen: + BS. NT, ND, soft to palpation.  Extremities: Warm and well perfused. Moving spontaneously. Ecchymosis on left proximal humerus. Neuro: PERRLA, CN intact. 1+ R patellar reflex, 2+ on L.     Laboratory: I have personally read and reviewed all labs and imaging studies.  CBC: Recent Labs  Lab 02/25/19 0327 02/27/19 0439 03/02/19 0553  WBC 9.4 10.1 7.0  HGB 8.1* 8.8* 10.0*  HCT 24.3* 26.6* 31.0*  MCV 96.0 96.0 98.1  PLT 493* 476* 488*   CMP: Recent Labs  Lab 02/25/19 0327 02/27/19 0439  03/02/19 0553 03/02/19 2106  NA 147* 145 150* 148*  K 3.7 3.8 3.4* 3.9  CL 114* 109 115* 113*  CO2 23 24 24 25   GLUCOSE 107* 120* 128* 132*  BUN 20 25* 30* 33*  CREATININE 0.74 0.84 0.78 0.86  CALCIUM 8.8* 9.2 9.0 9.0      CBG: Recent  Labs  Lab 02/27/19 0740 02/27/19 1128 02/27/19 1622 02/27/19 1905 03/01/19 0728  GLUCAP 105* 100* 126* 114* 85    Imaging/Diagnostic Tests: No results found.   Wilber Oliphant, MD 03/03/2019, 6:15 AM PGY-1, [place] Intern pager: [phone], text pages welcome

## 2019-03-03 NOTE — Progress Notes (Signed)
CSW following for discharge to SNF. CSW contacted by Admissions RN at [facility] that they remain concerned about his continual periodic tachycardia and will not be willing to take him until it is addressed. CSW discussed with RN who will follow up with MD.   CSW to continue to follow.  Laveda Abbe, [place] Clinical Social Worker [phone]

## 2019-03-04 DIAGNOSIS — R471 Dysarthria and anarthria: Secondary | ICD-10-CM | POA: Diagnosis not present

## 2019-03-04 DIAGNOSIS — D649 Anemia, unspecified: Secondary | ICD-10-CM | POA: Diagnosis not present

## 2019-03-04 DIAGNOSIS — R410 Disorientation, unspecified: Secondary | ICD-10-CM | POA: Diagnosis not present

## 2019-03-04 DIAGNOSIS — R262 Difficulty in walking, not elsewhere classified: Secondary | ICD-10-CM | POA: Diagnosis not present

## 2019-03-04 DIAGNOSIS — Z741 Need for assistance with personal care: Secondary | ICD-10-CM | POA: Diagnosis not present

## 2019-03-04 DIAGNOSIS — R269 Unspecified abnormalities of gait and mobility: Secondary | ICD-10-CM | POA: Diagnosis not present

## 2019-03-04 DIAGNOSIS — G319 Degenerative disease of nervous system, unspecified: Secondary | ICD-10-CM | POA: Diagnosis not present

## 2019-03-04 DIAGNOSIS — Z03818 Encounter for observation for suspected exposure to other biological agents ruled out: Secondary | ICD-10-CM | POA: Diagnosis not present

## 2019-03-04 DIAGNOSIS — R633 Feeding difficulties: Secondary | ICD-10-CM | POA: Diagnosis not present

## 2019-03-04 DIAGNOSIS — R000 Tachycardia, unspecified: Secondary | ICD-10-CM | POA: Diagnosis not present

## 2019-03-04 DIAGNOSIS — Z743 Need for continuous supervision: Secondary | ICD-10-CM | POA: Diagnosis not present

## 2019-03-04 DIAGNOSIS — G118 Other hereditary ataxias: Secondary | ICD-10-CM | POA: Diagnosis not present

## 2019-03-04 DIAGNOSIS — R339 Retention of urine, unspecified: Secondary | ICD-10-CM | POA: Diagnosis not present

## 2019-03-04 DIAGNOSIS — R531 Weakness: Secondary | ICD-10-CM | POA: Diagnosis not present

## 2019-03-04 DIAGNOSIS — M6281 Muscle weakness (generalized): Secondary | ICD-10-CM | POA: Diagnosis not present

## 2019-03-04 DIAGNOSIS — N401 Enlarged prostate with lower urinary tract symptoms: Secondary | ICD-10-CM | POA: Diagnosis not present

## 2019-03-04 DIAGNOSIS — G8220 Paraplegia, unspecified: Secondary | ICD-10-CM | POA: Diagnosis not present

## 2019-03-04 DIAGNOSIS — R1312 Dysphagia, oropharyngeal phase: Secondary | ICD-10-CM | POA: Diagnosis not present

## 2019-03-04 DIAGNOSIS — G243 Spasmodic torticollis: Secondary | ICD-10-CM | POA: Diagnosis not present

## 2019-03-04 DIAGNOSIS — R0902 Hypoxemia: Secondary | ICD-10-CM | POA: Diagnosis not present

## 2019-03-04 DIAGNOSIS — R279 Unspecified lack of coordination: Secondary | ICD-10-CM | POA: Diagnosis not present

## 2019-03-04 DIAGNOSIS — S42202A Unspecified fracture of upper end of left humerus, initial encounter for closed fracture: Secondary | ICD-10-CM | POA: Diagnosis not present

## 2019-03-04 DIAGNOSIS — R278 Other lack of coordination: Secondary | ICD-10-CM | POA: Diagnosis not present

## 2019-03-04 DIAGNOSIS — S42212D Unspecified displaced fracture of surgical neck of left humerus, subsequent encounter for fracture with routine healing: Secondary | ICD-10-CM | POA: Diagnosis not present

## 2019-03-04 DIAGNOSIS — R1310 Dysphagia, unspecified: Secondary | ICD-10-CM | POA: Diagnosis not present

## 2019-03-04 DIAGNOSIS — I10 Essential (primary) hypertension: Secondary | ICD-10-CM | POA: Diagnosis not present

## 2019-03-04 DIAGNOSIS — N179 Acute kidney failure, unspecified: Secondary | ICD-10-CM | POA: Diagnosis not present

## 2019-03-04 DIAGNOSIS — B004 Herpesviral encephalitis: Secondary | ICD-10-CM | POA: Diagnosis not present

## 2019-03-04 DIAGNOSIS — E870 Hyperosmolality and hypernatremia: Secondary | ICD-10-CM | POA: Diagnosis not present

## 2019-03-04 DIAGNOSIS — E876 Hypokalemia: Secondary | ICD-10-CM | POA: Diagnosis not present

## 2019-03-04 LAB — T4, FREE: Free T4: 1.18 ng/dL (ref 0.82–1.77)

## 2019-03-04 LAB — BASIC METABOLIC PANEL
Anion gap: 9 (ref 5–15)
BUN: 21 mg/dL — ABNORMAL HIGH (ref 6–20)
CO2: 25 mmol/L (ref 22–32)
Calcium: 8.6 mg/dL — ABNORMAL LOW (ref 8.9–10.3)
Chloride: 106 mmol/L (ref 98–111)
Creatinine, Ser: 0.7 mg/dL (ref 0.61–1.24)
GFR calc Af Amer: >60 mL/min (ref >60)
GFR calc non Af Amer: >60 mL/min (ref >60)
Glucose, Bld: 115 mg/dL — ABNORMAL HIGH (ref 70–99)
Potassium: 3.2 mmol/L — ABNORMAL LOW (ref 3.5–5.1)
Sodium: 140 mmol/L (ref 135–145)

## 2019-03-04 LAB — TSH: TSH: 0.484 u[IU]/mL (ref 0.350–4.500)

## 2019-03-04 MED ORDER — TAMSULOSIN HCL 0.4 MG PO CAPS: 0.8000 mg | ORAL_CAPSULE | Freq: Every day | ORAL | Status: DC

## 2019-03-04 MED ORDER — PANTOPRAZOLE SODIUM 40 MG PO TBEC: 40.0000 mg | DELAYED_RELEASE_TABLET | Freq: Every day | ORAL | Status: DC

## 2019-03-04 MED ORDER — PRO-STAT SUGAR FREE PO LIQD: 30.0000 mL | Freq: Two times a day (BID) | ORAL | 0 refills | Status: DC

## 2019-03-04 MED ORDER — AMLODIPINE BESYLATE 10 MG PO TABS: 10.0000 mg | ORAL_TABLET | Freq: Every day | ORAL | Status: DC

## 2019-03-04 MED ORDER — POTASSIUM CHLORIDE CRYS ER 20 MEQ PO TBCR
40.0000 meq | EXTENDED_RELEASE_TABLET | ORAL | Status: AC
  Administered 2019-03-04 (×2): 40 meq via ORAL
  Filled 2019-03-04 (×2): qty 2

## 2019-03-04 MED ORDER — POLYETHYLENE GLYCOL 3350 17 G PO PACK: 17.0000 g | PACK | Freq: Every day | ORAL | 0 refills | Status: DC

## 2019-03-04 NOTE — Progress Notes (Addendum)
Family Medicine Teaching Service Daily Progress Note Intern Pager: [phone]  Patient name: Patrick Daniel Medical record number: [id number] Date of birth: [birth year] Age: 63 y.o. 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 PMH is significant forcervical dystonia, dysarthria, HLD, gait abnormality, h/o myelopathy.  # Tachycardia  Reviewing patient's pulse since admission, patient stays persistently tachycardic in the low 100s on average.  Admission EKG shows sinus tachycardia with no abnormal rhythms.  On care everywhere, review of previous vital signs at various institutions, it appears that patient's pulse is in the low 100s from 2018.  It does not appear that patient has had any significant cardiac diseases per chart review through 2012.  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  Will place patient on cardiac monitoring to further capture possible A. fib or other etiologies of tachycardia.  Could consider hypovolemia, however, looking at cumulative I's and O's, patient is only 2 L under and has not had any significant loss or gains in fluids during this hospitalization.  Patient has no history of cardiovascular disease.   Will discuss this further with CSW as SNF placement is pending improvement of tachycardia.   Cardiac monitoring   D/C Amlodipine, start metoprolol   EKG with HR > 120  # HSV 2 encephalitis  #Spinocerebellar ataxia With severely worsened dysarthria  (improving) and dysphagia from baseline.  Last dose of IV acyclovir was this morning. Sleepiness resolved in the afternoon. Disposition last dose of IV acyclovir was this morning at 06 100.  Reach out to infectious disease for further recommendations about follow-up  Continue SLP, dysphagia diet  Urinary retention Patient failed voiding trial for the third time on 5/6.  No evidence of structural prostate disease per CT in April 2020.  No medications that would cause urinary retention.   Continue Foley  Continue Flomax 0.8 mg daily  Outpatient follow-up with urology  Hypernatremia, resolved  Sodium 140 this morning.  Improved with D5W.  Hold D5W  Would recommend BMP once weekly at skilled nursing facility  Anemia, stable Hemoglobin on 5/6 10.0  Once weekly CBC  Hypertension Patient is normotensive.   Discontinue amlodipine, start metoprolol  Left humeral neck fracture  Tylenol as needed pain  Monitor for pain with daily physical exam  #FEN/GI: Dysphagia 1 w/ nectar thick liquids, Klor Con 40 mEq x 2 for K 3.2  Access: Rt PIV 5/2  VTE prophylaxis: SCDs  Disposition: Medically stable for SNF when bed available ================================================================ Subjective:  NAEO. Improved sleepiness in the afternoon.   Objective: Temp:  [98.6 F (37 C)-99.2 F (37.3 C)] 98.7 F (37.1 C) (05/08 0418) Pulse Rate:  [88-103] 96 (05/08 0418) Resp:  [17-21] 17 (05/08 0418) BP: (110-123)/(71-89) 110/75 (05/08 0418) SpO2:  [92 %-100 %] 100 % (05/08 0418) 05/07 0701 - 05/08 0700 In: 2772.3 [P.O.:120; I.V.:2338.2; IV Piggyback:314.1] Out: 1375 [Urine:1375]   Physical Exam: General: NAD, non-toxic, well-appearing, sitting comfortably in bed.  Cardiovascular: RRR, normal S1, S2. 2+ RP bilaterally. No BLEE   Respiratory: CTAB. No IWOB.  Abdomen: +  BS. NT, ND, soft to palpation.  Extremities: Warm and well perfused. Moving spontaneously.  Neuro: A &  O x 4.   Laboratory: I have personally read and reviewed all labs and imaging studies.  CBC: Recent Labs  Lab 02/27/19 0439 03/02/19 0553  WBC 10.1 7.0  HGB 8.8* 10.0*  HCT 26.6* 31.0*  MCV 96.0 98.1  PLT 476* 488*   CMP: Recent Labs  Lab 02/27/19 0439 03/02/19 0553 03/02/19 2106 03/04/19 0352  NA 145 150* 148* 140  K 3.8 3.4* 3.9 3.2*  CL 109 115* 113* 106  CO2 24 24 25 25   GLUCOSE 120* 128* 132* 115*  BUN 25* 30* 33* 21*  CREATININE 0.84 0.78 0.86 0.70  CALCIUM 9.2 9.0 9.0 8.6*      CBG: Recent Labs  Lab 02/27/19 0740 02/27/19 1128 02/27/19 1622 02/27/19 1905 03/01/19 0728  GLUCAP 105* 100* 126* 114* 85    Imaging/Diagnostic Tests: No results found.   Wilber Oliphant, MD 03/04/2019, 6:23 AM PGY-1, [place] Intern pager: [phone], text pages welcome

## 2019-03-04 NOTE — Progress Notes (Signed)
Telemetry applied and verified with NT per order and protocol. Delia Heady RN

## 2019-03-04 NOTE — Progress Notes (Signed)
Patient requests to be full code at this time. He was unable to make medical decision upon admission and DNR was placed per daughter's wishes. Patient is able to understand the significance of being full code and what would result in an emergency. I encouraged him to speak more about his medical wishes with his sister and daughter.   Zettie Cooley, M.D.  Family Medicine  PGY-1 03/04/2019 3:00 PM

## 2019-03-04 NOTE — TOC Transition Note (Addendum)
Transition of Care [facility]) - CM/SW Discharge Note   Patient Details  Name: Patrick Daniel MRN: [id number] Date of Birth: [birth year]  [facility] CM/SW Contact:  Geralynn Ochs, LCSW Phone Number: 03/04/2019, 3:22 PM   Clinical Narrative:   Nurse to call report to [phone]   Final next level of care: Skilled Nursing Facility Barriers to Discharge: No Barriers Identified   Patient Goals and CMS Choice Patient states their goals for this hospitalization and ongoing recovery are:: patient unable to participate in goal setting; sister wants the patient to recover and feel better      Discharge Placement              Patient [facility] at: [facility] Patient to be transferred to facility by: [place] Name of family member notified: Rosalie Patient and family notified of of transfer: 03/04/19  Discharge Plan and Services                                     Social Determinants of Health (SDOH) Interventions     Readmission Risk Interventions No flowsheet data found.

## 2019-03-04 NOTE — Progress Notes (Signed)
Physical Therapy Treatment Patient Details Name: Patrick Daniel MRN: [id number] DOB: [birth year] Today's Date: 03/04/2019    History of Present Illness 63 y.o. male presenting with AMS after being found down at [place]. PMH is significant for cervical dystonia, dysarthria, HLD, gait abnormality (?CNS degenerative disorder?). Patient unable to provide insight to what caused fall given chronic speech issues that have acutely worsened. Known mobility issues at baseline with frequent falls. Concern for traumatic head injury after fall vs stroke vs seizure. Head CT with temporal lobe edema. MRI with concern for acute or early subacute infarct vs infection like HSV. Pt also found to have comminuted fx of L humeral neck, ortho following.     PT Comments    Focus of session was standing balance with Stedy so NT could assist with peri-care after bowel incontinence in the bed. Pt was able to achieve a stand of ~45" with +2 assist for balance support, safety, and upright posture. Pt fatigued quickly and second stand ~10". Will continue to follow and progress as able per POC.    Follow Up Recommendations  SNF;Supervision/Assistance - 24 hour     Equipment Recommendations  Other (comment)(TBD by next venue of care)    Recommendations for Other Services       Precautions / Restrictions Precautions Precautions: Fall;Shoulder Type of Shoulder Precautions: sling, NWB  Shoulder Interventions: Shoulder sling/immobilizer Precaution Comments: impulsive Required Braces or Orthoses: Sling Restrictions Weight Bearing Restrictions: Yes LUE Weight Bearing: Non weight bearing    Mobility  Bed Mobility Overal bed mobility: Needs Assistance Bed Mobility: Supine to Sit     Supine to sit: Mod assist;HOB elevated;+2 for safety/equipment     General bed mobility comments: transitions towards R side of bed with mod assist for trunk +2 for safety   Transfers Overall transfer level: Needs assistance Equipment  used: Ambulation equipment used Transfers: Sit to/from Stand Sit to Stand: Mod assist;+2 physical assistance         General transfer comment: sit to stand using stedy, assist required to avoid use of L UE, for balance and physical assist to ascend. Pt able to hold first stand well ~45 seconds. Pt fatigued on second stand and unable to hold >10".  Ambulation/Gait             General Gait Details: Not able to progress to gait training    Stairs             Wheelchair Mobility    Modified Rankin (Stroke Patients Only)       Balance Overall balance assessment: Needs assistance Sitting-balance support: Feet supported Sitting balance-Leahy Scale: Poor Sitting balance - Comments: min-mod assist required for sitting balance  Postural control: Right lateral lean;Posterior lean Standing balance support: Single extremity supported;During functional activity Standing balance-Leahy Scale: Poor Standing balance comment: relaint on external support                             Cognition Arousal/Alertness: Awake/alert Behavior During Therapy: Impulsive Overall Cognitive Status: Impaired/Different from baseline Area of Impairment: Attention;Following commands;Safety/judgement;Awareness;Problem solving                   Current Attention Level: Focused   Following Commands: Follows one step commands inconsistently;Follows one step commands with increased time Safety/Judgement: Decreased awareness of deficits;Decreased awareness of safety Awareness: Intellectual Problem Solving: Difficulty sequencing;Requires verbal cues;Requires tactile cues;Slow processing;Decreased initiation General Comments: pt able to follow 1 step commands inconsistently  today, poor awareness to deficits with maximal redirection required for safety, and to avoid use of L UE       Exercises      General Comments        Pertinent Vitals/Pain Pain Assessment: Faces Faces Pain  Scale: Hurts little more Pain Location: LUE with sling application Pain Intervention(s): Monitored during session    Home Living                      Prior Function            PT Goals (current goals can now be found in the care plan section) Acute Rehab PT Goals Patient Stated Goal: None stated this session PT Goal Formulation: Patient unable to participate in goal setting Time For Goal Achievement: 03/10/19 Potential to Achieve Goals: Good Progress towards PT goals: Progressing toward goals    Frequency    Min 2X/week      PT Plan Current plan remains appropriate; Frequency needs to be updated    Co-evaluation              AM-PAC PT "6 Clicks" Mobility   Outcome Measure  Help needed turning from your back to your side while in a flat bed without using bedrails?: A Lot Help needed moving from lying on your back to sitting on the side of a flat bed without using bedrails?: A Lot Help needed moving to and from a bed to a chair (including a wheelchair)?: A Lot Help needed standing up from a chair using your arms (e.g., wheelchair or bedside chair)?: A Lot Help needed to walk in hospital room?: Total Help needed climbing 3-5 steps with a railing? : Total 6 Click Score: 10    End of Session Equipment Utilized During Treatment: Gait belt Activity Tolerance: Patient tolerated treatment well Patient left: in chair;with call bell/phone within reach;with chair alarm set Nurse Communication: Mobility status PT Visit Diagnosis: Muscle weakness (generalized) (M62.81);History of falling (Z91.81);Difficulty in walking, not elsewhere classified (R26.2);Pain Pain - Right/Left: Left Pain - part of body: Shoulder     Time: 1610-9604 PT Time Calculation (min) (ACUTE ONLY): 27 min  Charges:  $Therapeutic Activity: 23-37 mins                     Rolinda Roan, PT, DPT Acute Rehabilitation Services Pager: [phone] Office: [phone]    Thelma Comp 03/04/2019, 12:16 PM

## 2019-03-04 NOTE — Progress Notes (Signed)
Patient discharging To [facility] Nurse will call report and spoke with tina

## 2019-03-04 NOTE — Progress Notes (Signed)
Nutrition Follow-up  DOCUMENTATION CODES:   Not applicable  INTERVENTION:  Recommend continuation of nutritional supplements post discharge to aid in caloric and protein needs.   Continue Magic cup TID with meals, each supplement provides 290 kcal and 9 grams of protein  Continue 30 ml Prostat po BID (thickened to nectar thick liquids), each supplement provides 100 kcal and 15 grams of protein.   NUTRITION DIAGNOSIS:   Inadequate oral intake related to inability to eat as evidenced by NPO status; diet advanced; improving  GOAL:   Patient will meet greater than or equal to 90% of their needs; progressing  MONITOR:   PO intake, Supplement acceptance, Labs, Weight trends, I & O's, Skin  REASON FOR ASSESSMENT:   Consult Enteral/tube feeding initiation and management  ASSESSMENT:   63 y.o. male presenting with AMS after being found down. PMH is significant for cervical dystonia, dysarthria, HLD, gait abnormality, h/o myelopathy. 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  Pt continues on a dysphagia 1 diet with nectar thick liquids. Meal completion has been 40-50%. Pt currently has Magic cup and Prostat ordered and has been consuming them. Pt to discharge to SNF when bed available. Recommend continuation of nutritional supplements post discharge to aid in caloric and protein needs.   Labs and medications reviewed.   Diet Order:   Diet Order            DIET - DYS 1 Room service appropriate? Yes; Fluid consistency: Nectar Thick  Diet effective now              EDUCATION NEEDS:   Not appropriate for education at this time  Skin:  Skin Assessment: Reviewed RN Assessment  Last BM:  5/7  Height:   Ht Readings from Last 1 Encounters:  02/11/19 5\' 6"  (1.676 m)    Weight:   Wt Readings from Last 1 Encounters:  03/04/19 58.6 kg    Ideal Body Weight:  64.5 kg  BMI:  Body mass index is 20.85 kg/m.  Estimated Nutritional Needs:   Kcal:  9628-3662  Protein:  90-100  grams  Fluid:  1.9 - 2.1 L/day   Corrin Parker, MS, RD, LDN Pager # [phone] After hours/ weekend pager # [phone]

## 2019-03-04 NOTE — Progress Notes (Signed)
  Speech Language Pathology Treatment: Cognitive-Linquistic  Patient Details Name: Patrick Daniel MRN: [id number] DOB: 03/30/1956 Today's Date: 03/04/2019 Time: 7078-6754 SLP Time Calculation (min) (ACUTE ONLY): 22 min  Assessment / Plan / Recommendation Clinical Impression  Pt's level of alertness was notably improved compared to that which was noted yesterday. He demonstrated 100% accuracy with identification of pictures by function from a field of four. He achieved 100% accuracy with 5-item immediate recall but demonstrated 20% accuracy with recall of objective information from voicemails increasing to 60% accuracy with mod. cues and repetition. He completed time problems with 60% accuracy given visual input increasing to 80% accuracy with min-mod cues. He demonstrated 50% accuracy with use of an increasing vocal intensity at the word level and 33% accuracy at the spontaneous phrase level both increasing to 100% accuracy with mod cues. SLP will continue to follow pt.    HPI HPI: Patrick Daniel is a 63 y.o. male presenting with history of paraplegia, myelopathy, dysarthria, cervical dystonia, (?CNS degenerative disorder )admitted from indpendendent living facility with AMS after being found down. 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 This distribution may also be seen in infection, particularly herpes encephalitis, though this seems less likely in this presentation. Sustained  aleft humerus fx, rhabdomyolsis. 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      SLP Plan  Continue with current plan of care       Recommendations                   Oral Care Recommendations: Oral care BID Follow up Recommendations: Skilled Nursing facility SLP Visit Diagnosis: Dysarthria and anarthria (R47.1) Plan: Continue with current plan of care       Branden Shallenberger I. Hardin Negus, [place],  [place] Office number [phone] Pager [phone]                Horton Marshall 03/04/2019, 1:45 PM

## 2019-03-05 LAB — T3, FREE: T3, Free: 2.4 pg/mL (ref 2.0–4.4)

## 2019-03-10 DIAGNOSIS — D649 Anemia, unspecified: Secondary | ICD-10-CM | POA: Diagnosis not present

## 2019-03-10 DIAGNOSIS — B004 Herpesviral encephalitis: Secondary | ICD-10-CM | POA: Diagnosis not present

## 2019-03-10 DIAGNOSIS — G118 Other hereditary ataxias: Secondary | ICD-10-CM | POA: Diagnosis not present

## 2019-03-10 DIAGNOSIS — R339 Retention of urine, unspecified: Secondary | ICD-10-CM | POA: Diagnosis not present

## 2019-03-16 DIAGNOSIS — R627 Adult failure to thrive: Secondary | ICD-10-CM

## 2019-03-16 DIAGNOSIS — S42202A Unspecified fracture of upper end of left humerus, initial encounter for closed fracture: Secondary | ICD-10-CM | POA: Diagnosis not present

## 2019-03-17 DIAGNOSIS — G118 Other hereditary ataxias: Secondary | ICD-10-CM | POA: Diagnosis not present

## 2019-03-17 DIAGNOSIS — D649 Anemia, unspecified: Secondary | ICD-10-CM | POA: Diagnosis not present

## 2019-03-17 DIAGNOSIS — R339 Retention of urine, unspecified: Secondary | ICD-10-CM | POA: Diagnosis not present

## 2019-03-17 DIAGNOSIS — B004 Herpesviral encephalitis: Secondary | ICD-10-CM | POA: Diagnosis not present

## 2019-03-18 ENCOUNTER — Telehealth: Payer: Self-pay | Admitting: Internal Medicine

## 2019-03-18 NOTE — Telephone Encounter (Signed)
DOD 03/17/19 PM   Dr. Henrene Pastor, there is a referral from [facility] for pt to FU with GI for anemia and positive FOBT.  Records from [facility] will be sent to you for review.

## 2019-03-18 NOTE — Telephone Encounter (Signed)
I will forward to Dr. Carlean Purl, who actually saw the patient as an inpatient last month. Please provide him with the relevant records. Thanks

## 2019-03-24 DIAGNOSIS — B004 Herpesviral encephalitis: Secondary | ICD-10-CM | POA: Diagnosis not present

## 2019-03-24 DIAGNOSIS — R339 Retention of urine, unspecified: Secondary | ICD-10-CM | POA: Diagnosis not present

## 2019-03-24 DIAGNOSIS — D649 Anemia, unspecified: Secondary | ICD-10-CM | POA: Diagnosis not present

## 2019-03-24 DIAGNOSIS — G118 Other hereditary ataxias: Secondary | ICD-10-CM | POA: Diagnosis not present

## 2019-03-25 NOTE — Telephone Encounter (Signed)
I have left a message with the NP who referred him. Waiting for call back. When I saw him in the hospital I did not think he had a GI hemorrhage but a heme + stool and an anemia due to acute serious illness.

## 2019-03-28 DIAGNOSIS — R339 Retention of urine, unspecified: Secondary | ICD-10-CM | POA: Diagnosis not present

## 2019-03-29 ENCOUNTER — Other Ambulatory Visit: Payer: Self-pay

## 2019-03-29 ENCOUNTER — Encounter: Payer: Self-pay | Admitting: Infectious Disease

## 2019-03-29 ENCOUNTER — Ambulatory Visit (INDEPENDENT_AMBULATORY_CARE_PROVIDER_SITE_OTHER): Payer: Medicare Other | Admitting: Infectious Disease

## 2019-03-29 VITALS — BP 102/70 | HR 106 | Temp 98.0°F

## 2019-03-29 DIAGNOSIS — G319 Degenerative disease of nervous system, unspecified: Secondary | ICD-10-CM

## 2019-03-29 DIAGNOSIS — B004 Herpesviral encephalitis: Secondary | ICD-10-CM | POA: Diagnosis not present

## 2019-03-29 DIAGNOSIS — G243 Spasmodic torticollis: Secondary | ICD-10-CM | POA: Diagnosis not present

## 2019-03-29 DIAGNOSIS — G118 Other hereditary ataxias: Secondary | ICD-10-CM | POA: Diagnosis not present

## 2019-03-29 DIAGNOSIS — G8220 Paraplegia, unspecified: Secondary | ICD-10-CM

## 2019-03-29 DIAGNOSIS — R339 Retention of urine, unspecified: Secondary | ICD-10-CM | POA: Diagnosis not present

## 2019-03-29 DIAGNOSIS — D649 Anemia, unspecified: Secondary | ICD-10-CM | POA: Diagnosis not present

## 2019-03-29 NOTE — Progress Notes (Signed)
Subjective:    Patient ID: Patrick Daniel, male    DOB: 08-16-1956, 63 y.o.   MRN: [id number]  HPI  63 y.o.male with altered mental status, fever and AKI at presentation to the hospital after apparently having found unresponsive for a prolonged time frame. He has rhabdomyolysis and AKI with unknown down time of up to 3 days. 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  He was given a complete 21-day course of acyclovir in the hospital and ultimately discharged to [facility] skilled nursing facility in [place].  We did not feel he needed a follow-up visit with [place] but apparently the skilled nursing facility physicians felt he did.  In seeing him today he looks much better than many patients I have seen with herpes encephalitis and is able to converse with me.  He obviously has multiple underlying neurological deficits to begin with and these certainly have not been reversed.  He does have inability to walk now and paraplegia,  If this is somehow related to his herpes encephalitis there is really nothing for [place] to be able to do because we have given him a complete course of treatment with 21 days.  I personally think the paraplegia is unlikely to be due to to herpes and if it is again we have completed therapy.  He has been seen by urology who are planning to place a suprapubic catheter they gave him a dose of gentamicin yesterday when they changed his Foley catheter.  Past Medical History:  Diagnosis Date  . Dysarthria   . Dystonia   . Gait disturbance   . Myelopathy ([place])   . Paraplegia [facility])     Past Surgical History:  Procedure Laterality Date  . ANTERIOR CERVICAL DECOMP/DISCECTOMY FUSION N/A 10/04/2013   s/p C4-C5, C5-C6 anterior cervical diskectomy and fusion  . POSTERIOR CERVICAL FUSION/FORAMINOTOMY N/A 10/04/2013    posterior cervical fusion from C2-T2 with rods and screws and local bone graft and also  decompressive laminectomy from the bottom of C2 to the top of T1 with decompression of spinal cord and the nerve roots bilaterally performed by [name] on 10/04/13     Family History  Problem Relation Age of Onset  . Heart disease Mother   . Cancer Mother   . Heart Problems Father   . High Cholesterol Sister   . High blood pressure Sister   . Diabetes Sister   . High blood pressure Sister       Social History   Socioeconomic History  . Marital status: Single    Spouse name: Not on file  . Number of children: 1  . Years of education: Not on file  . Highest education level: Not on file  Occupational History  . Occupation: retired    Comment: Biomedical scientist  . Financial resource strain: Not on file  . Food insecurity:    Worry: Not on file    Inability: Not on file  . Transportation needs:    Medical: Not on file    Non-medical: Not on file  Tobacco Use  . Smoking status: Former Smoker    Types: Cigarettes    Last attempt to quit: 02/01/2013    Years since quitting: 6.1  . Smokeless tobacco: Never Used  . Tobacco comment: quit   Substance and Sexual Activity  . Alcohol use: No  . Drug use: No  . Sexual activity: Not on file  Lifestyle  . Physical  activity:    Days per week: Not on file    Minutes per session: Not on file  . Stress: Not on file  Relationships  . Social connections:    Talks on phone: Not on file    Gets together: Not on file    Attends religious service: Not on file    Active member of club or organization: Not on file    Attends meetings of clubs or organizations: Not on file    Relationship status: Not on file  Other Topics Concern  . Not on file  Social History Narrative   Patient lives in independent living [place]. Patient is retired from [employer]. Patient has one child. Right handed. Patient is divorced.   Caffeine- 12 - Coffee daily - one daily -soda    Allergies  Allergen Reactions  . Baclofen Other (See Comments)     Weakness, drowsiness     Current Outpatient Medications:  .  Amino Acids-Protein Hydrolys (FEEDING SUPPLEMENT, PRO-STAT SUGAR FREE 64,) LIQD, Take 30 mLs by mouth 2 (two) times daily., Disp: 887 mL, Rfl: 0 .  amLODipine (NORVASC) 10 MG tablet, Take 1 tablet (10 mg total) by mouth daily., Disp: , Rfl:  .  pantoprazole (PROTONIX) 40 MG tablet, Take 1 tablet (40 mg total) by mouth daily., Disp: , Rfl:  .  polyethylene glycol (MIRALAX / GLYCOLAX) 17 g packet, Take 17 g by mouth daily., Disp: 14 each, Rfl: 0 .  tamsulosin (FLOMAX) 0.4 MG CAPS capsule, Take 2 capsules (0.8 mg total) by mouth daily., Disp: 30 capsule, Rfl:   Current Facility-Administered Medications:  .  incobotulinumtoxinA (XEOMIN) 100 units injection 200 Units, 200 Units, Intramuscular, Q90 days, Marcial Pacas, MD, 200 Units at 12/01/18 1034   Review of Systems  Constitutional: Negative for activity change, appetite change, chills, diaphoresis, fatigue, fever and unexpected weight change.  HENT: Negative for congestion, rhinorrhea, sinus pressure, sneezing, sore throat and trouble swallowing.   Eyes: Negative for photophobia and visual disturbance.  Respiratory: Negative for cough, chest tightness, shortness of breath, wheezing and stridor.   Cardiovascular: Negative for chest pain, palpitations and leg swelling.  Gastrointestinal: Negative for abdominal distention, abdominal pain, anal bleeding, blood in stool, constipation, diarrhea, nausea and vomiting.  Genitourinary: Negative for difficulty urinating, dysuria, flank pain and hematuria.  Musculoskeletal: Positive for gait problem and myalgias. Negative for arthralgias, back pain and joint swelling.  Skin: Negative for color change, pallor, rash and wound.  Neurological: Positive for tremors, speech difficulty, weakness and numbness. Negative for dizziness and light-headedness.  Hematological: Negative for adenopathy. Does not bruise/bleed easily.  Psychiatric/Behavioral:  Negative for agitation, behavioral problems, confusion, decreased concentration, dysphoric mood and sleep disturbance.       Objective:   Physical Exam Constitutional:      Appearance: He is well-developed.  HENT:     Head: Normocephalic and atraumatic.  Eyes:     Conjunctiva/sclera: Conjunctivae normal.  Neck:     Musculoskeletal: Normal range of motion and neck supple.  Cardiovascular:     Rate and Rhythm: Normal rate and regular rhythm.  Pulmonary:     Effort: Pulmonary effort is normal. No respiratory distress.     Breath sounds: No wheezing.  Abdominal:     General: There is no distension.     Palpations: Abdomen is soft.  Musculoskeletal: Normal range of motion.        General: No tenderness.  Skin:    General: Skin is warm and dry.  Coloration: Skin is not pale.     Findings: No erythema or rash.  Neurological:     Mental Status: He is alert and oriented to person, place, and time.     Cranial Nerves: Dysarthria present.     Motor: Weakness, atrophy and abnormal muscle tone present.     Comments: Paraplegia,   Psychiatric:        Attention and Perception: Attention normal.        Mood and Affect: Mood normal.        Speech: Speech is slurred.        Behavior: Behavior is cooperative.        Thought Content: Thought content normal.        Cognition and Memory: Cognition normal.           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  Certainly his MRI findings and his PCR positivity are consistent with this entity.  He was appropriately treated with 21 days of acyclovir  There is nothing really for [place] to do with him an infectious disease standpoint I do not think he will have recurrence of herpes simplex 2 encephalitis herpes simplex 2 meningitis does recur at times but he has no history of this.  There is really no other role for an  antiviral or an infectious disease intervention at this point in time.  He can follow-up in our clinic as needed.

## 2019-03-29 NOTE — Telephone Encounter (Signed)
Spoke to Patrick Limbo, NP about Patrick Daniel. His Hgb up to baseline at 10+ now.  In my opinion he is not a good candidate to do endoscopic evaluation due to dysphagia (cannot take thin liquids) and overall health status. When I saw him at the hospital I did not think he had a GI hemorrhage but thought his acute severe illness caused anemia and he also had a meme + stool of uncertain significance. There was no clinical evidence supporting a major GI hemorrhage.  I do not plan to see him unless something changes as given his current health issues and the Covid-19 pandemic on top furthre evaluation with endoscopic procedures not in his best interest.    Lab Results  Component Value Date   VITAMINB12 1,196 (H) 02/11/2019   Lab Results  Component Value Date   FOLATE 33.2 02/11/2019   Lab Results  Component Value Date   IRON 51 02/11/2019   TIBC 230 (L) 02/11/2019   FERRITIN 327 02/11/2019

## 2019-04-05 DIAGNOSIS — D649 Anemia, unspecified: Secondary | ICD-10-CM | POA: Diagnosis not present

## 2019-04-05 DIAGNOSIS — R339 Retention of urine, unspecified: Secondary | ICD-10-CM | POA: Diagnosis not present

## 2019-04-05 DIAGNOSIS — B004 Herpesviral encephalitis: Secondary | ICD-10-CM | POA: Diagnosis not present

## 2019-04-05 DIAGNOSIS — G118 Other hereditary ataxias: Secondary | ICD-10-CM | POA: Diagnosis not present

## 2019-04-18 ENCOUNTER — Telehealth (HOSPITAL_COMMUNITY): Payer: Self-pay

## 2019-04-18 NOTE — Telephone Encounter (Signed)
Called to schedule catheter placement, no answer, vm full. AW

## 2019-04-19 ENCOUNTER — Telehealth (HOSPITAL_COMMUNITY): Payer: Self-pay

## 2019-04-19 NOTE — Telephone Encounter (Signed)
Called to schedule catheter placement, no answer, vm full. AW

## 2019-04-20 ENCOUNTER — Telehealth (HOSPITAL_COMMUNITY): Payer: Self-pay

## 2019-04-20 NOTE — Telephone Encounter (Signed)
I've been trying to reach the patient for about a week to schedule with no answer. I finally called the sisters cell and got in touch with her. Pt is in the nursing home. They went ahead and scheduled [place] because [place] was easier for the nursing home. I explained the procedure to her and she thanked me for reaching out. AW

## 2019-05-02 DIAGNOSIS — B004 Herpesviral encephalitis: Secondary | ICD-10-CM | POA: Diagnosis not present

## 2019-05-02 DIAGNOSIS — R339 Retention of urine, unspecified: Secondary | ICD-10-CM | POA: Diagnosis not present

## 2019-05-02 DIAGNOSIS — G118 Other hereditary ataxias: Secondary | ICD-10-CM | POA: Diagnosis not present

## 2019-05-02 DIAGNOSIS — D649 Anemia, unspecified: Secondary | ICD-10-CM | POA: Diagnosis not present

## 2019-05-05 DIAGNOSIS — R262 Difficulty in walking, not elsewhere classified: Secondary | ICD-10-CM | POA: Diagnosis not present

## 2019-05-05 DIAGNOSIS — G8220 Paraplegia, unspecified: Secondary | ICD-10-CM | POA: Diagnosis not present

## 2019-05-05 DIAGNOSIS — R1312 Dysphagia, oropharyngeal phase: Secondary | ICD-10-CM | POA: Diagnosis not present

## 2019-05-05 DIAGNOSIS — E876 Hypokalemia: Secondary | ICD-10-CM | POA: Diagnosis not present

## 2019-05-05 DIAGNOSIS — N401 Enlarged prostate with lower urinary tract symptoms: Secondary | ICD-10-CM | POA: Diagnosis not present

## 2019-05-05 DIAGNOSIS — Z79899 Other long term (current) drug therapy: Secondary | ICD-10-CM | POA: Diagnosis not present

## 2019-05-05 DIAGNOSIS — G243 Spasmodic torticollis: Secondary | ICD-10-CM | POA: Diagnosis not present

## 2019-05-05 DIAGNOSIS — S42292D Other displaced fracture of upper end of left humerus, subsequent encounter for fracture with routine healing: Secondary | ICD-10-CM | POA: Diagnosis not present

## 2019-05-05 DIAGNOSIS — I10 Essential (primary) hypertension: Secondary | ICD-10-CM | POA: Diagnosis not present

## 2019-05-05 DIAGNOSIS — Z9181 History of falling: Secondary | ICD-10-CM | POA: Diagnosis not present

## 2019-05-05 DIAGNOSIS — G118 Other hereditary ataxias: Secondary | ICD-10-CM | POA: Diagnosis not present

## 2019-05-05 DIAGNOSIS — E870 Hyperosmolality and hypernatremia: Secondary | ICD-10-CM | POA: Diagnosis not present

## 2019-05-05 DIAGNOSIS — R278 Other lack of coordination: Secondary | ICD-10-CM | POA: Diagnosis not present

## 2019-05-05 DIAGNOSIS — B004 Herpesviral encephalitis: Secondary | ICD-10-CM | POA: Diagnosis not present

## 2019-05-05 DIAGNOSIS — W19XXXA Unspecified fall, initial encounter: Secondary | ICD-10-CM | POA: Diagnosis not present

## 2019-05-05 DIAGNOSIS — Z466 Encounter for fitting and adjustment of urinary device: Secondary | ICD-10-CM | POA: Diagnosis not present

## 2019-05-05 DIAGNOSIS — R339 Retention of urine, unspecified: Secondary | ICD-10-CM | POA: Diagnosis not present

## 2019-05-05 DIAGNOSIS — R000 Tachycardia, unspecified: Secondary | ICD-10-CM | POA: Diagnosis not present

## 2019-05-05 DIAGNOSIS — R633 Feeding difficulties: Secondary | ICD-10-CM | POA: Diagnosis not present

## 2019-05-05 DIAGNOSIS — X58XXXA Exposure to other specified factors, initial encounter: Secondary | ICD-10-CM | POA: Diagnosis not present

## 2019-05-05 DIAGNOSIS — M6281 Muscle weakness (generalized): Secondary | ICD-10-CM | POA: Diagnosis not present

## 2019-05-05 DIAGNOSIS — R269 Unspecified abnormalities of gait and mobility: Secondary | ICD-10-CM | POA: Diagnosis not present

## 2019-05-05 DIAGNOSIS — D649 Anemia, unspecified: Secondary | ICD-10-CM | POA: Diagnosis not present

## 2019-05-07 DIAGNOSIS — G118 Other hereditary ataxias: Secondary | ICD-10-CM | POA: Diagnosis not present

## 2019-05-07 DIAGNOSIS — S42292D Other displaced fracture of upper end of left humerus, subsequent encounter for fracture with routine healing: Secondary | ICD-10-CM | POA: Diagnosis not present

## 2019-05-07 DIAGNOSIS — B004 Herpesviral encephalitis: Secondary | ICD-10-CM | POA: Diagnosis not present

## 2019-05-07 DIAGNOSIS — R1312 Dysphagia, oropharyngeal phase: Secondary | ICD-10-CM | POA: Diagnosis not present

## 2019-05-07 DIAGNOSIS — G243 Spasmodic torticollis: Secondary | ICD-10-CM | POA: Diagnosis not present

## 2019-05-07 DIAGNOSIS — G8220 Paraplegia, unspecified: Secondary | ICD-10-CM | POA: Diagnosis not present

## 2019-05-10 DIAGNOSIS — G8220 Paraplegia, unspecified: Secondary | ICD-10-CM | POA: Diagnosis not present

## 2019-05-10 DIAGNOSIS — G118 Other hereditary ataxias: Secondary | ICD-10-CM | POA: Diagnosis not present

## 2019-05-10 DIAGNOSIS — S42292D Other displaced fracture of upper end of left humerus, subsequent encounter for fracture with routine healing: Secondary | ICD-10-CM | POA: Diagnosis not present

## 2019-05-10 DIAGNOSIS — R1312 Dysphagia, oropharyngeal phase: Secondary | ICD-10-CM | POA: Diagnosis not present

## 2019-05-10 DIAGNOSIS — B004 Herpesviral encephalitis: Secondary | ICD-10-CM | POA: Diagnosis not present

## 2019-05-10 DIAGNOSIS — G243 Spasmodic torticollis: Secondary | ICD-10-CM | POA: Diagnosis not present

## 2019-05-12 ENCOUNTER — Other Ambulatory Visit: Payer: Self-pay

## 2019-05-12 ENCOUNTER — Encounter: Payer: Self-pay | Admitting: Neurology

## 2019-05-12 ENCOUNTER — Ambulatory Visit (INDEPENDENT_AMBULATORY_CARE_PROVIDER_SITE_OTHER): Payer: Medicare Other | Admitting: Neurology

## 2019-05-12 VITALS — BP 118/62 | HR 94 | Temp 98.2°F

## 2019-05-12 DIAGNOSIS — G319 Degenerative disease of nervous system, unspecified: Secondary | ICD-10-CM | POA: Diagnosis not present

## 2019-05-12 DIAGNOSIS — R471 Dysarthria and anarthria: Secondary | ICD-10-CM

## 2019-05-12 DIAGNOSIS — R269 Unspecified abnormalities of gait and mobility: Secondary | ICD-10-CM | POA: Diagnosis not present

## 2019-05-12 DIAGNOSIS — G243 Spasmodic torticollis: Secondary | ICD-10-CM | POA: Diagnosis not present

## 2019-05-12 DIAGNOSIS — B004 Herpesviral encephalitis: Secondary | ICD-10-CM | POA: Diagnosis not present

## 2019-05-12 DIAGNOSIS — Z978 Presence of other specified devices: Secondary | ICD-10-CM | POA: Diagnosis not present

## 2019-05-12 DIAGNOSIS — Z7689 Persons encountering health services in other specified circumstances: Secondary | ICD-10-CM | POA: Diagnosis not present

## 2019-05-12 DIAGNOSIS — M6282 Rhabdomyolysis: Secondary | ICD-10-CM | POA: Diagnosis not present

## 2019-05-12 DIAGNOSIS — G114 Hereditary spastic paraplegia: Secondary | ICD-10-CM | POA: Diagnosis not present

## 2019-05-12 MED ORDER — CLONAZEPAM 0.5 MG PO TABS: 0.5000 mg | ORAL_TABLET | Freq: Two times a day (BID) | ORAL | 0 refills | Status: DC | PRN

## 2019-05-12 MED ORDER — DIVALPROEX SODIUM ER 500 MG PO TB24: 500.0000 mg | ORAL_TABLET | Freq: Every day | ORAL | 11 refills | Status: DC

## 2019-05-12 MED ORDER — TRAMADOL HCL 50 MG PO TABS: 50.0000 mg | ORAL_TABLET | Freq: Two times a day (BID) | ORAL | 5 refills | Status: DC | PRN

## 2019-05-12 NOTE — Progress Notes (Signed)
[facility]  PATIENT: Patrick Daniel DOB: 1962/09/07   REASON FOR VISIT: Follow-up for paraplegia, cervical dystonia gait disturbance, dysarthria HISTORY FROM: Patient    HISTORY OF PRESENT ILLNESS: He has past medical history of hyperlipidemia, depression, He presented with gradual onset language difficulty, gait difficulty since 2009. He was evaluated by [facility] neurologist at [facility], reported normal MRI of brain, he was diagnosed with cervical dystonia, was given Botox injection of the cervical muscles, which has improved his symptoms temporarily.  He moved back to the [name] in April 2012, began to seek medical attention, initially evaluated by [name], reported again normal MRI of brain, also had trial of Haldol and baclofen without much improvement.  Sinemet did not help him. Artane and baclofen provide no help either. 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 He was evaluated by local neurosurgeon [name] at [facility], Because concomitant central nervous system degenerative disorder, cervical dystonia, he is hesitate to perform decompression surgery. I later referred him to [facility] neurology, evaluated by [name], who has suggested Botox injection to alleviate his cervical dystonia symptoms first, may consider cervical decompression surgery later. Also, likely there is a component of central nervous system degenerative disorder, such as spinocerebellar ataxia,.  He has significant dysarthria, also dysphagia, gait difficulty, frequent falling, he denies sensory changes. Normal or negative CMP, PEP, CBC, HIV, Lyme disease, SSA/B, TSH, ACE, RPR, CRP, CPK. He received first EMG guided Botox injection since  March 2012, he reported great improvement, he has been receiving BOTOX injection every 3 months, there is significant improvement of his abnormal neck postural, but still with severe retrocollis, left turn, right tilt. he began to have moderate improvement after repeat injection. He was evaluated by his neurosurgeon [name] at [facility], repeat MRI cervical showed advanced multilevel degenerative changes of the cervical spine, most pronounced at C5-C6, where there is severe central stenosis, impression on the cord with focal myelomalacia and severe bilateral neuroforaminal narrowing. Overall appearance is similar to prior study from 09/18/2010. He had posterior decompression and fusion surgery in December 2013, recovering well, but complains of neck muscle spasm, and chest muscle spasm continued spastic gait difficulty, occasional urinary incontinence, his posture is much improved, but has limited range of motion of his neck  He had EMG guided BOTOX injection by me since March 2012, most recent EMG guided Botox injection was by [name] April 10 2015,300 units of Botox A  He continued to have unsteady gait, difficulty finding a comfortable position, urinary urgency, he denies constipation, denies upper extremity paresthesia or weakness, slow worsening dysarthria,   MRI of the brain in 2016 showed generalized atrophy, most prominent in vermis,  genetic or degenerative processes that may present with both ataxia and dystonia such as the spinal cerebellar ataxias and OPCA.   Additional note is made of a few small T2/flair hyperintense foci consistent with minimal small vessel ischemic changes. MRI of cervical spine: Prior C4-C6 ACDF and C2-T2 posterior fusion. The vertebral bodies are normally aligned.   Metal artifact from these fusions obscures many of the images. Multilevel cervical degenerative disc disease, most significant findings are at C5-C6. There appears to be a posterior bony ridge that  mildly narrows the neural canal but does not appear to be causing spinal  cord compression. However, there is possible foraminal narrowing to the right that could lead to some right C6 nerve root impingement.    MRI lumbar in December 2017 no significant abnormality   he reported long history of heavy alcohol abuse for more than 20 years,  He has continued EMG guided botulinum toxin injection for his cervical dystonia over the past few years, last injection was in February 2020,  He was admitted to the hospital on February 11, 2019 after he fell in his apartment lying on the floor for unknown period of time, upon admission, elevated CPK 1988.  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, he was seen by ID consult, acyclovir treatment for 21 days, he also suffered acute kidney failure, that recovered to normal with creatinine of 0.7 upon discharge, he also had a urinary retention, still using Foley, Flomax, was seen by urologist, doubt he is a candidate for suprapubic catheter, he has worsening gait abnormality, can no longer ambulate without assistant, no bowel incontinence, he also suffered left humeral neck fracture, still has limited range of motion of left arm  He was found to be anemia, hemoglobin dropped to 6, required blood transfusion, suspicious for GI bleeding, further work-up is pending, upon discharge, hemoglobin was on Mar 02, 2019,   He is now home with his daughter staying wheelchair, complains of left shoulder pain  REVIEW OF SYSTEMS: Full 14 system review of systems performed and notable only for those listed, all others are neg:  As above.    ALLERGIES: Allergies  Allergen Reactions  . Baclofen Other (See Comments)    Weakness, drowsiness    HOME MEDICATIONS: Outpatient Medications Prior to Visit  Medication Sig Dispense Refill   . CLONAZEPAM PO Take by mouth. 0.5 tablet at bedtime as needed for sleep.    . DULoxetine (CYMBALTA) 60 MG capsule Take 60 mg by mouth daily.    Marland Kitchen loratadine (CLARITIN) 10 MG tablet Take 5 mg by mouth daily.    . pantoprazole (PROTONIX) 40 MG tablet Take 1 tablet (40 mg total) by mouth daily.    . tamsulosin (FLOMAX) 0.4 MG CAPS capsule Take 2 capsules (0.8 mg total) by mouth daily. 30 capsule   . Amino Acids-Protein Hydrolys (FEEDING SUPPLEMENT, PRO-STAT SUGAR FREE 64,) LIQD Take 30 mLs by mouth 2 (two) times daily. 887 mL 0  . amLODipine (NORVASC) 10 MG tablet Take 1 tablet (10 mg total) by mouth daily.    . polyethylene glycol (MIRALAX / GLYCOLAX) 17 g packet Take 17 g by mouth daily. 14 each 0  . incobotulinumtoxinA (XEOMIN) 100 units injection 200 Units      No facility-administered medications prior to visit.     PAST MEDICAL HISTORY: Past Medical History:  Diagnosis Date  . Dysarthria   . Dystonia   . Gait disturbance   . Myelopathy ([place])   . Paraplegia ([place])     PAST SURGICAL HISTORY: Past Surgical History:  Procedure Laterality Date  . ANTERIOR CERVICAL DECOMP/DISCECTOMY FUSION N/A 10/04/2013   s/p C4-C5, C5-C6 anterior cervical diskectomy and fusion  . POSTERIOR CERVICAL FUSION/FORAMINOTOMY N/A 10/04/2013    posterior cervical fusion from C2-T2 with rods and screws and local bone graft and also decompressive laminectomy from the bottom of C2 to the top of T1 with decompression of spinal cord and the nerve roots bilaterally performed by [name] on 10/04/13  FAMILY HISTORY: Family History  Problem Relation Age of Onset  . Heart disease Mother   . Cancer Mother   . Heart Problems Father   . High Cholesterol Sister   . High blood pressure Sister   . Diabetes Sister   . High blood pressure Sister     SOCIAL HISTORY: Social History   Socioeconomic History  . Marital status: Single    Spouse name: Not on file  . Number of children: 1  . Years of education:  Not on file  . Highest education level: Not on file  Occupational History  . Occupation: retired    Comment: Biomedical scientist  . Financial resource strain: Not on file  . Food insecurity    Worry: Not on file    Inability: Not on file  . Transportation needs    Medical: Not on file    Non-medical: Not on file  Tobacco Use  . Smoking status: Former Smoker    Types: Cigarettes    Quit date: 02/01/2013    Years since quitting: 6.2  . Smokeless tobacco: Never Used  . Tobacco comment: quit   Substance and Sexual Activity  . Alcohol use: No  . Drug use: No  . Sexual activity: Not on file  Lifestyle  . Physical activity    Days per week: Not on file    Minutes per session: Not on file  . Stress: Not on file  Relationships  . Social Herbalist on phone: Not on file    Gets together: Not on file    Attends religious service: Not on file    Active member of club or organization: Not on file    Attends meetings of clubs or organizations: Not on file    Relationship status: Not on file  . Intimate partner violence    Fear of current or ex partner: Not on file    Emotionally abused: Not on file    Physically abused: Not on file    Forced sexual activity: Not on file  Other Topics Concern  . Not on file  Social History Narrative   Patient lives in independent living [place]. Patient is retired from [employer]. Patient has one child. Right handed. Patient is divorced.   Caffeine- 12 - Coffee daily - one daily -soda     PHYSICAL EXAM  Vitals:   05/12/19 1442  BP: 118/62  Pulse: 94  Temp: 98.2 F (36.8 C)   There is no height or weight on file to calculate BMI. Generalized: Well developed, in no acute distress  Head: normocephalic and atraumatic,. Oropharynx benign  Neck: Severe limited range of motion to the neck, difficulty turning to the right side mouth right shoulder elevationHe has mild left shoulder anterior shift, mild left shoulder elevation,  moderate retrocollis, mild left tilt, right turn  Cardiac: Regular rate rhythm, no murmur  Musculoskeletal: No deformity   Neurological examination   Mentation: Alert oriented to time, place, history taking. Attention span and concentration appropriate. Recent and remote memory intact. Follows all commands, he has mild spastic dysarthria.  Cranial nerve II-XII: Pupils were equal round reactive to light extraocular movements were full, visual field were full on confrontational test. Facial sensation and strength were normal. hearing was intact to finger rubbing bilaterally. Uvula tongue midline. head turning and shoulder shrug were normal and symmetric.Tongue protrusion into cheek strength was normal. Motor: He has limited range of motion of left shoulder, no significant  weakness bilateral upper extremity, moderate spasticity of bilateral lower extremity, Sensory: normal and symmetric to light touch, pinprick, and Vibration,  Coordination: finger-nose-finger, heel-to-shin bilaterally, no dysmetria Reflexes: 3+ upper lower and symmetric, plantar responses were extensor bilaterally. Gait and Station: Deferred  DIAGNOSTIC DATA (LABS, IMAGING, TESTING) -  ASSESSMENT AND PLAN 63 y.o. 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  Evidence of superior cerebellar vermis atrophy, likely a component of central nervous system degenerative disorder  Cervical dystonia:  Limited range of motion of his neck, status post cervical posterior decompression fusion in 2013, He has forceful neck turning towards the left side, with frequent left platysmas contraction,  No significant shoulder elevation, mild retrocollis, mild left turn,  Hold off botulinum toxin injection at this point  Herpes simplex type II virus infection  High risk for seizure   EEG  Depakote ER  500 mg every night for mood stabilizer, seizure prevention  Clonazepam 0.5 mg as needed for insomnia, tramadol as needed for pain      Marcial Pacas, M.D. Ph.D.  [facility] [place], [place] [address] Phone: [phone] Fax:      [phone]

## 2019-05-16 DIAGNOSIS — B004 Herpesviral encephalitis: Secondary | ICD-10-CM | POA: Diagnosis not present

## 2019-05-16 DIAGNOSIS — G243 Spasmodic torticollis: Secondary | ICD-10-CM | POA: Diagnosis not present

## 2019-05-16 DIAGNOSIS — R1312 Dysphagia, oropharyngeal phase: Secondary | ICD-10-CM | POA: Diagnosis not present

## 2019-05-16 DIAGNOSIS — S42292D Other displaced fracture of upper end of left humerus, subsequent encounter for fracture with routine healing: Secondary | ICD-10-CM | POA: Diagnosis not present

## 2019-05-16 DIAGNOSIS — G118 Other hereditary ataxias: Secondary | ICD-10-CM | POA: Diagnosis not present

## 2019-05-16 DIAGNOSIS — G8220 Paraplegia, unspecified: Secondary | ICD-10-CM | POA: Diagnosis not present

## 2019-05-17 DIAGNOSIS — B004 Herpesviral encephalitis: Secondary | ICD-10-CM | POA: Diagnosis not present

## 2019-05-17 DIAGNOSIS — G118 Other hereditary ataxias: Secondary | ICD-10-CM | POA: Diagnosis not present

## 2019-05-17 DIAGNOSIS — S42292D Other displaced fracture of upper end of left humerus, subsequent encounter for fracture with routine healing: Secondary | ICD-10-CM | POA: Diagnosis not present

## 2019-05-17 DIAGNOSIS — G243 Spasmodic torticollis: Secondary | ICD-10-CM | POA: Diagnosis not present

## 2019-05-17 DIAGNOSIS — R1312 Dysphagia, oropharyngeal phase: Secondary | ICD-10-CM | POA: Diagnosis not present

## 2019-05-17 DIAGNOSIS — G8220 Paraplegia, unspecified: Secondary | ICD-10-CM | POA: Diagnosis not present

## 2019-05-19 DIAGNOSIS — G118 Other hereditary ataxias: Secondary | ICD-10-CM | POA: Diagnosis not present

## 2019-05-19 DIAGNOSIS — S42292D Other displaced fracture of upper end of left humerus, subsequent encounter for fracture with routine healing: Secondary | ICD-10-CM | POA: Diagnosis not present

## 2019-05-19 DIAGNOSIS — G8220 Paraplegia, unspecified: Secondary | ICD-10-CM | POA: Diagnosis not present

## 2019-05-19 DIAGNOSIS — B004 Herpesviral encephalitis: Secondary | ICD-10-CM | POA: Diagnosis not present

## 2019-05-19 DIAGNOSIS — R1312 Dysphagia, oropharyngeal phase: Secondary | ICD-10-CM | POA: Diagnosis not present

## 2019-05-19 DIAGNOSIS — G243 Spasmodic torticollis: Secondary | ICD-10-CM | POA: Diagnosis not present

## 2019-05-20 DIAGNOSIS — B004 Herpesviral encephalitis: Secondary | ICD-10-CM | POA: Diagnosis not present

## 2019-05-20 DIAGNOSIS — G8220 Paraplegia, unspecified: Secondary | ICD-10-CM | POA: Diagnosis not present

## 2019-05-20 DIAGNOSIS — G118 Other hereditary ataxias: Secondary | ICD-10-CM | POA: Diagnosis not present

## 2019-05-20 DIAGNOSIS — R1312 Dysphagia, oropharyngeal phase: Secondary | ICD-10-CM | POA: Diagnosis not present

## 2019-05-20 DIAGNOSIS — G243 Spasmodic torticollis: Secondary | ICD-10-CM | POA: Diagnosis not present

## 2019-05-20 DIAGNOSIS — S42292D Other displaced fracture of upper end of left humerus, subsequent encounter for fracture with routine healing: Secondary | ICD-10-CM | POA: Diagnosis not present

## 2019-05-23 DIAGNOSIS — G243 Spasmodic torticollis: Secondary | ICD-10-CM | POA: Diagnosis not present

## 2019-05-23 DIAGNOSIS — R1312 Dysphagia, oropharyngeal phase: Secondary | ICD-10-CM | POA: Diagnosis not present

## 2019-05-23 DIAGNOSIS — G8220 Paraplegia, unspecified: Secondary | ICD-10-CM | POA: Diagnosis not present

## 2019-05-23 DIAGNOSIS — B004 Herpesviral encephalitis: Secondary | ICD-10-CM | POA: Diagnosis not present

## 2019-05-23 DIAGNOSIS — S42292D Other displaced fracture of upper end of left humerus, subsequent encounter for fracture with routine healing: Secondary | ICD-10-CM | POA: Diagnosis not present

## 2019-05-23 DIAGNOSIS — G118 Other hereditary ataxias: Secondary | ICD-10-CM | POA: Diagnosis not present

## 2019-05-24 DIAGNOSIS — G8220 Paraplegia, unspecified: Secondary | ICD-10-CM | POA: Diagnosis not present

## 2019-05-24 DIAGNOSIS — B004 Herpesviral encephalitis: Secondary | ICD-10-CM | POA: Diagnosis not present

## 2019-05-24 DIAGNOSIS — G243 Spasmodic torticollis: Secondary | ICD-10-CM | POA: Diagnosis not present

## 2019-05-24 DIAGNOSIS — G118 Other hereditary ataxias: Secondary | ICD-10-CM | POA: Diagnosis not present

## 2019-05-24 DIAGNOSIS — R1312 Dysphagia, oropharyngeal phase: Secondary | ICD-10-CM | POA: Diagnosis not present

## 2019-05-24 DIAGNOSIS — S42292D Other displaced fracture of upper end of left humerus, subsequent encounter for fracture with routine healing: Secondary | ICD-10-CM | POA: Diagnosis not present

## 2019-05-30 DIAGNOSIS — S42292D Other displaced fracture of upper end of left humerus, subsequent encounter for fracture with routine healing: Secondary | ICD-10-CM | POA: Diagnosis not present

## 2019-05-30 DIAGNOSIS — R1312 Dysphagia, oropharyngeal phase: Secondary | ICD-10-CM | POA: Diagnosis not present

## 2019-05-30 DIAGNOSIS — G118 Other hereditary ataxias: Secondary | ICD-10-CM | POA: Diagnosis not present

## 2019-05-30 DIAGNOSIS — G8220 Paraplegia, unspecified: Secondary | ICD-10-CM | POA: Diagnosis not present

## 2019-05-30 DIAGNOSIS — G243 Spasmodic torticollis: Secondary | ICD-10-CM | POA: Diagnosis not present

## 2019-05-30 DIAGNOSIS — B004 Herpesviral encephalitis: Secondary | ICD-10-CM | POA: Diagnosis not present

## 2019-05-31 DIAGNOSIS — S42292D Other displaced fracture of upper end of left humerus, subsequent encounter for fracture with routine healing: Secondary | ICD-10-CM | POA: Diagnosis not present

## 2019-05-31 DIAGNOSIS — R1312 Dysphagia, oropharyngeal phase: Secondary | ICD-10-CM | POA: Diagnosis not present

## 2019-05-31 DIAGNOSIS — G118 Other hereditary ataxias: Secondary | ICD-10-CM | POA: Diagnosis not present

## 2019-05-31 DIAGNOSIS — G8220 Paraplegia, unspecified: Secondary | ICD-10-CM | POA: Diagnosis not present

## 2019-05-31 DIAGNOSIS — B004 Herpesviral encephalitis: Secondary | ICD-10-CM | POA: Diagnosis not present

## 2019-05-31 DIAGNOSIS — G243 Spasmodic torticollis: Secondary | ICD-10-CM | POA: Diagnosis not present

## 2019-06-02 DIAGNOSIS — S42202A Unspecified fracture of upper end of left humerus, initial encounter for closed fracture: Secondary | ICD-10-CM | POA: Diagnosis not present

## 2019-06-03 DIAGNOSIS — G8220 Paraplegia, unspecified: Secondary | ICD-10-CM | POA: Diagnosis not present

## 2019-06-03 DIAGNOSIS — S42292D Other displaced fracture of upper end of left humerus, subsequent encounter for fracture with routine healing: Secondary | ICD-10-CM | POA: Diagnosis not present

## 2019-06-03 DIAGNOSIS — G243 Spasmodic torticollis: Secondary | ICD-10-CM | POA: Diagnosis not present

## 2019-06-03 DIAGNOSIS — G118 Other hereditary ataxias: Secondary | ICD-10-CM | POA: Diagnosis not present

## 2019-06-03 DIAGNOSIS — R1312 Dysphagia, oropharyngeal phase: Secondary | ICD-10-CM | POA: Diagnosis not present

## 2019-06-03 DIAGNOSIS — B004 Herpesviral encephalitis: Secondary | ICD-10-CM | POA: Diagnosis not present

## 2019-06-04 DIAGNOSIS — W19XXXA Unspecified fall, initial encounter: Secondary | ICD-10-CM | POA: Diagnosis not present

## 2019-06-04 DIAGNOSIS — G243 Spasmodic torticollis: Secondary | ICD-10-CM | POA: Diagnosis not present

## 2019-06-04 DIAGNOSIS — R269 Unspecified abnormalities of gait and mobility: Secondary | ICD-10-CM | POA: Diagnosis not present

## 2019-06-04 DIAGNOSIS — E876 Hypokalemia: Secondary | ICD-10-CM | POA: Diagnosis not present

## 2019-06-04 DIAGNOSIS — I10 Essential (primary) hypertension: Secondary | ICD-10-CM | POA: Diagnosis not present

## 2019-06-04 DIAGNOSIS — M6281 Muscle weakness (generalized): Secondary | ICD-10-CM | POA: Diagnosis not present

## 2019-06-04 DIAGNOSIS — R633 Feeding difficulties: Secondary | ICD-10-CM | POA: Diagnosis not present

## 2019-06-04 DIAGNOSIS — R339 Retention of urine, unspecified: Secondary | ICD-10-CM | POA: Diagnosis not present

## 2019-06-04 DIAGNOSIS — G8220 Paraplegia, unspecified: Secondary | ICD-10-CM | POA: Diagnosis not present

## 2019-06-04 DIAGNOSIS — Z79899 Other long term (current) drug therapy: Secondary | ICD-10-CM | POA: Diagnosis not present

## 2019-06-04 DIAGNOSIS — B004 Herpesviral encephalitis: Secondary | ICD-10-CM | POA: Diagnosis not present

## 2019-06-04 DIAGNOSIS — D649 Anemia, unspecified: Secondary | ICD-10-CM | POA: Diagnosis not present

## 2019-06-04 DIAGNOSIS — E870 Hyperosmolality and hypernatremia: Secondary | ICD-10-CM | POA: Diagnosis not present

## 2019-06-04 DIAGNOSIS — R1312 Dysphagia, oropharyngeal phase: Secondary | ICD-10-CM | POA: Diagnosis not present

## 2019-06-04 DIAGNOSIS — N401 Enlarged prostate with lower urinary tract symptoms: Secondary | ICD-10-CM | POA: Diagnosis not present

## 2019-06-04 DIAGNOSIS — Z466 Encounter for fitting and adjustment of urinary device: Secondary | ICD-10-CM | POA: Diagnosis not present

## 2019-06-04 DIAGNOSIS — R262 Difficulty in walking, not elsewhere classified: Secondary | ICD-10-CM | POA: Diagnosis not present

## 2019-06-04 DIAGNOSIS — G118 Other hereditary ataxias: Secondary | ICD-10-CM | POA: Diagnosis not present

## 2019-06-04 DIAGNOSIS — R000 Tachycardia, unspecified: Secondary | ICD-10-CM | POA: Diagnosis not present

## 2019-06-04 DIAGNOSIS — R278 Other lack of coordination: Secondary | ICD-10-CM | POA: Diagnosis not present

## 2019-06-04 DIAGNOSIS — X58XXXA Exposure to other specified factors, initial encounter: Secondary | ICD-10-CM | POA: Diagnosis not present

## 2019-06-04 DIAGNOSIS — S42292D Other displaced fracture of upper end of left humerus, subsequent encounter for fracture with routine healing: Secondary | ICD-10-CM | POA: Diagnosis not present

## 2019-06-04 DIAGNOSIS — Z9181 History of falling: Secondary | ICD-10-CM | POA: Diagnosis not present

## 2019-06-06 ENCOUNTER — Other Ambulatory Visit: Payer: Self-pay

## 2019-06-06 ENCOUNTER — Ambulatory Visit (INDEPENDENT_AMBULATORY_CARE_PROVIDER_SITE_OTHER): Payer: Medicare Other | Admitting: Neurology

## 2019-06-06 DIAGNOSIS — G0481 Other encephalitis and encephalomyelitis: Secondary | ICD-10-CM | POA: Diagnosis not present

## 2019-06-06 DIAGNOSIS — R471 Dysarthria and anarthria: Secondary | ICD-10-CM

## 2019-06-06 DIAGNOSIS — G319 Degenerative disease of nervous system, unspecified: Secondary | ICD-10-CM

## 2019-06-06 DIAGNOSIS — R269 Unspecified abnormalities of gait and mobility: Secondary | ICD-10-CM

## 2019-06-06 DIAGNOSIS — B004 Herpesviral encephalitis: Secondary | ICD-10-CM

## 2019-06-08 DIAGNOSIS — R339 Retention of urine, unspecified: Secondary | ICD-10-CM | POA: Diagnosis not present

## 2019-06-08 DIAGNOSIS — G959 Disease of spinal cord, unspecified: Secondary | ICD-10-CM | POA: Diagnosis not present

## 2019-06-09 DIAGNOSIS — S42292D Other displaced fracture of upper end of left humerus, subsequent encounter for fracture with routine healing: Secondary | ICD-10-CM | POA: Diagnosis not present

## 2019-06-09 DIAGNOSIS — G118 Other hereditary ataxias: Secondary | ICD-10-CM | POA: Diagnosis not present

## 2019-06-09 DIAGNOSIS — G243 Spasmodic torticollis: Secondary | ICD-10-CM | POA: Diagnosis not present

## 2019-06-09 DIAGNOSIS — G8220 Paraplegia, unspecified: Secondary | ICD-10-CM | POA: Diagnosis not present

## 2019-06-09 DIAGNOSIS — B004 Herpesviral encephalitis: Secondary | ICD-10-CM | POA: Diagnosis not present

## 2019-06-09 DIAGNOSIS — R1312 Dysphagia, oropharyngeal phase: Secondary | ICD-10-CM | POA: Diagnosis not present

## 2019-06-13 DIAGNOSIS — B004 Herpesviral encephalitis: Secondary | ICD-10-CM | POA: Diagnosis not present

## 2019-06-13 DIAGNOSIS — G243 Spasmodic torticollis: Secondary | ICD-10-CM | POA: Diagnosis not present

## 2019-06-13 DIAGNOSIS — G118 Other hereditary ataxias: Secondary | ICD-10-CM | POA: Diagnosis not present

## 2019-06-13 DIAGNOSIS — S42292D Other displaced fracture of upper end of left humerus, subsequent encounter for fracture with routine healing: Secondary | ICD-10-CM | POA: Diagnosis not present

## 2019-06-13 DIAGNOSIS — G8220 Paraplegia, unspecified: Secondary | ICD-10-CM | POA: Diagnosis not present

## 2019-06-13 DIAGNOSIS — R1312 Dysphagia, oropharyngeal phase: Secondary | ICD-10-CM | POA: Diagnosis not present

## 2019-06-16 DIAGNOSIS — G243 Spasmodic torticollis: Secondary | ICD-10-CM | POA: Diagnosis not present

## 2019-06-16 DIAGNOSIS — S42292D Other displaced fracture of upper end of left humerus, subsequent encounter for fracture with routine healing: Secondary | ICD-10-CM | POA: Diagnosis not present

## 2019-06-16 DIAGNOSIS — B004 Herpesviral encephalitis: Secondary | ICD-10-CM | POA: Diagnosis not present

## 2019-06-16 DIAGNOSIS — G8220 Paraplegia, unspecified: Secondary | ICD-10-CM | POA: Diagnosis not present

## 2019-06-16 DIAGNOSIS — G118 Other hereditary ataxias: Secondary | ICD-10-CM | POA: Diagnosis not present

## 2019-06-16 DIAGNOSIS — R1312 Dysphagia, oropharyngeal phase: Secondary | ICD-10-CM | POA: Diagnosis not present

## 2019-06-20 DIAGNOSIS — G118 Other hereditary ataxias: Secondary | ICD-10-CM | POA: Diagnosis not present

## 2019-06-20 DIAGNOSIS — S42292D Other displaced fracture of upper end of left humerus, subsequent encounter for fracture with routine healing: Secondary | ICD-10-CM | POA: Diagnosis not present

## 2019-06-20 DIAGNOSIS — B004 Herpesviral encephalitis: Secondary | ICD-10-CM | POA: Diagnosis not present

## 2019-06-20 DIAGNOSIS — G8220 Paraplegia, unspecified: Secondary | ICD-10-CM | POA: Diagnosis not present

## 2019-06-20 DIAGNOSIS — R1312 Dysphagia, oropharyngeal phase: Secondary | ICD-10-CM | POA: Diagnosis not present

## 2019-06-20 DIAGNOSIS — G243 Spasmodic torticollis: Secondary | ICD-10-CM | POA: Diagnosis not present

## 2019-06-22 DIAGNOSIS — G8220 Paraplegia, unspecified: Secondary | ICD-10-CM | POA: Diagnosis not present

## 2019-06-22 DIAGNOSIS — B004 Herpesviral encephalitis: Secondary | ICD-10-CM | POA: Diagnosis not present

## 2019-06-22 DIAGNOSIS — S42292D Other displaced fracture of upper end of left humerus, subsequent encounter for fracture with routine healing: Secondary | ICD-10-CM | POA: Diagnosis not present

## 2019-06-22 DIAGNOSIS — R1312 Dysphagia, oropharyngeal phase: Secondary | ICD-10-CM | POA: Diagnosis not present

## 2019-06-22 DIAGNOSIS — G118 Other hereditary ataxias: Secondary | ICD-10-CM | POA: Diagnosis not present

## 2019-06-22 DIAGNOSIS — G243 Spasmodic torticollis: Secondary | ICD-10-CM | POA: Diagnosis not present

## 2019-06-23 NOTE — Procedures (Signed)
   HISTORY: 63 years old male, with history of central nervous system degenerative disorder, suffered herpes encephalitis in April 2020 TECHNIQUE:  This is a routine 16 channel EEG recording with one channel devoted to a limited EKG recording.  It was performed during wakefulness, drowsiness and asleep.  Hyperventilation and photic stimulation were performed as activating procedures.  There are minimum muscle and movement artifact noted.  Upon maximum arousal, posterior dominant waking rhythm consistent of mildly dysrhythmic alpha range activity, with frequency of 8-9 hz. Activities are symmetric over the bilateral posterior derivations and attenuated with eye opening.  Hyperventilation produced mild/moderate buildup with higher amplitude and the slower activities noted.  Photic stimulation did not alter the tracing.  During EEG recording, patient developed drowsiness and no deeper stage of sleep was achieved  During EEG recording, there was no epileptiform discharge noted.  EKG demonstrate sinus rhythm, with heart rate of 108 bpm  CONCLUSION: This is a  normal awake EEG.  There is no electrodiagnostic evidence of epileptiform discharge.  Marcial Pacas, M.D. Ph.D.  [facility] [place], [place] [address] Phone: [phone] Fax:      [phone]

## 2019-06-24 DIAGNOSIS — G8220 Paraplegia, unspecified: Secondary | ICD-10-CM | POA: Diagnosis not present

## 2019-06-24 DIAGNOSIS — G118 Other hereditary ataxias: Secondary | ICD-10-CM | POA: Diagnosis not present

## 2019-06-24 DIAGNOSIS — S42292D Other displaced fracture of upper end of left humerus, subsequent encounter for fracture with routine healing: Secondary | ICD-10-CM | POA: Diagnosis not present

## 2019-06-24 DIAGNOSIS — R1312 Dysphagia, oropharyngeal phase: Secondary | ICD-10-CM | POA: Diagnosis not present

## 2019-06-24 DIAGNOSIS — B004 Herpesviral encephalitis: Secondary | ICD-10-CM | POA: Diagnosis not present

## 2019-06-24 DIAGNOSIS — G243 Spasmodic torticollis: Secondary | ICD-10-CM | POA: Diagnosis not present

## 2019-06-29 DIAGNOSIS — G8220 Paraplegia, unspecified: Secondary | ICD-10-CM | POA: Diagnosis not present

## 2019-06-29 DIAGNOSIS — R1312 Dysphagia, oropharyngeal phase: Secondary | ICD-10-CM | POA: Diagnosis not present

## 2019-06-29 DIAGNOSIS — G243 Spasmodic torticollis: Secondary | ICD-10-CM | POA: Diagnosis not present

## 2019-06-29 DIAGNOSIS — S42292D Other displaced fracture of upper end of left humerus, subsequent encounter for fracture with routine healing: Secondary | ICD-10-CM | POA: Diagnosis not present

## 2019-06-29 DIAGNOSIS — G118 Other hereditary ataxias: Secondary | ICD-10-CM | POA: Diagnosis not present

## 2019-06-29 DIAGNOSIS — B004 Herpesviral encephalitis: Secondary | ICD-10-CM | POA: Diagnosis not present

## 2019-06-30 DIAGNOSIS — S42292D Other displaced fracture of upper end of left humerus, subsequent encounter for fracture with routine healing: Secondary | ICD-10-CM | POA: Diagnosis not present

## 2019-06-30 DIAGNOSIS — R1312 Dysphagia, oropharyngeal phase: Secondary | ICD-10-CM | POA: Diagnosis not present

## 2019-06-30 DIAGNOSIS — G118 Other hereditary ataxias: Secondary | ICD-10-CM | POA: Diagnosis not present

## 2019-06-30 DIAGNOSIS — G8220 Paraplegia, unspecified: Secondary | ICD-10-CM | POA: Diagnosis not present

## 2019-06-30 DIAGNOSIS — G243 Spasmodic torticollis: Secondary | ICD-10-CM | POA: Diagnosis not present

## 2019-06-30 DIAGNOSIS — B004 Herpesviral encephalitis: Secondary | ICD-10-CM | POA: Diagnosis not present

## 2019-08-08 ENCOUNTER — Encounter: Payer: Self-pay | Admitting: Neurology

## 2019-08-08 ENCOUNTER — Other Ambulatory Visit: Payer: Self-pay

## 2019-08-08 ENCOUNTER — Ambulatory Visit (INDEPENDENT_AMBULATORY_CARE_PROVIDER_SITE_OTHER): Payer: Medicare Other | Admitting: Neurology

## 2019-08-08 VITALS — BP 113/70 | HR 98 | Temp 98.3°F | Ht 66.0 in | Wt 141.0 lb

## 2019-08-08 DIAGNOSIS — G243 Spasmodic torticollis: Secondary | ICD-10-CM | POA: Diagnosis not present

## 2019-08-08 MED ORDER — INCOBOTULINUMTOXINA 100 UNITS IM SOLR
200.0000 [IU] | INTRAMUSCULAR | Status: DC
  Administered 2019-08-08: 17:00:00 200 [IU] via INTRAMUSCULAR

## 2019-08-08 MED ORDER — CLONAZEPAM 0.5 MG PO TABS: 0.5000 mg | ORAL_TABLET | Freq: Two times a day (BID) | ORAL | 5 refills | Status: DC | PRN

## 2019-08-08 NOTE — Progress Notes (Signed)
**  Xeomin 100 units x 2 vials, NDC [id number], Lot [id number], Exp 06/2021, office supply.//mck,rn**

## 2019-08-08 NOTE — Progress Notes (Signed)
[facility]  PATIENT: Patrick Daniel DOB: 1956/09/07   REASON FOR VISIT: Follow-up for paraplegia, cervical dystonia gait disturbance, dysarthria HISTORY FROM: Patient    HISTORY OF PRESENT ILLNESS: He has past medical history of hyperlipidemia, depression, He presented with gradual onset language difficulty, gait difficulty since 2009. He was evaluated by [facility] neurologist at [facility], reported normal MRI of brain, he was diagnosed with cervical dystonia, was given Botox injection of the cervical muscles, which has improved his symptoms temporarily.  He moved back to the [name] in April 2012, began to seek medical attention, initially evaluated by [name], reported again normal MRI of brain, also had trial of Haldol and baclofen without much improvement.  Sinemet did not help him. Artane and baclofen provide no help either. 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 He was evaluated by local neurosurgeon [name] at [facility], Because concomitant central nervous system degenerative disorder, cervical dystonia, he is hesitate to perform decompression surgery. I later referred him to [facility] neurology, evaluated by [name], who has suggested Botox injection to alleviate his cervical dystonia symptoms first, may consider cervical decompression surgery later. Also, likely there is a component of central nervous system degenerative disorder, such as spinocerebellar ataxia,.  He has significant dysarthria, also dysphagia, gait difficulty, frequent falling, he denies sensory changes. Normal or negative CMP, PEP, CBC, HIV, Lyme disease, SSA/B, TSH, ACE, RPR, CRP, CPK. He received first EMG guided Botox injection since  March 2012, he reported great improvement, he has been receiving BOTOX injection every 3 months, there is significant improvement of his abnormal neck postural, but still with severe retrocollis, left turn, right tilt. he began to have moderate improvement after repeat injection. He was evaluated by his neurosurgeon [name] at [facility], repeat MRI cervical showed advanced multilevel degenerative changes of the cervical spine, most pronounced at C5-C6, where there is severe central stenosis, impression on the cord with focal myelomalacia and severe bilateral neuroforaminal narrowing. Overall appearance is similar to prior study from 09/18/2010. He had posterior decompression and fusion surgery in December 2013, recovering well, but complains of neck muscle spasm, and chest muscle spasm continued spastic gait difficulty, occasional urinary incontinence, his posture is much improved, but has limited range of motion of his neck  He had EMG guided BOTOX injection by me since March 2012, most recent EMG guided Botox injection was by [name] April 10 2015,300 units of Botox A  He continued to have unsteady gait, difficulty finding a comfortable position, urinary urgency, he denies constipation, denies upper extremity paresthesia or weakness, slow worsening dysarthria,   MRI of the brain in 2016 showed generalized atrophy, most prominent in vermis,  genetic or degenerative processes that may present with both ataxia and dystonia such as the spinal cerebellar ataxias and OPCA.   Additional note is made of a few small T2/flair hyperintense foci consistent with minimal small vessel ischemic changes. MRI of cervical spine: Prior C4-C6 ACDF and C2-T2 posterior fusion. The vertebral bodies are normally aligned.   Metal artifact from these fusions obscures many of the images. Multilevel cervical degenerative disc disease, most significant findings are at C5-C6. There appears to be a posterior bony ridge that  mildly narrows the neural canal but does not appear to be causing spinal  cord compression. However, there is possible foraminal narrowing to the right that could lead to some right C6 nerve root impingement.    MRI lumbar in December 2017 no significant abnormality   he reported long history of heavy alcohol abuse for more than 20 years,  He has continued EMG guided botulinum toxin injection for his cervical dystonia over the past few years, last injection was in February 2020,  He was admitted to the hospital on February 11, 2019 after he fell in his apartment lying on the floor for unknown period of time, upon admission, elevated CPK 1988.  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, he was seen by ID consult, acyclovir treatment for 21 days, he also suffered acute kidney failure, that recovered to normal with creatinine of 0.7 upon discharge, he also had a urinary retention, still using Foley, Flomax, was seen by urologist, doubt he is a candidate for suprapubic catheter, he has worsening gait abnormality, can no longer ambulate without assistant, no bowel incontinence, he also suffered left humeral neck fracture, still has limited range of motion of left arm  He was found to be anemia, hemoglobin dropped to 6, required blood transfusion, suspicious for GI bleeding, further work-up is pending, upon discharge, hemoglobin was 10 on Mar 02, 2019,   He is now home with his daughter staying wheelchair, complains of left shoulder pain  UPDATE Aug 08 2019: He continues to make recovery, is able to ambulate with walker, continue have Foley catheter, has significant neck pain, difficulty sleeping  REVIEW OF SYSTEMS: Full 14 system review of systems performed and notable only for those listed, all others are neg:  As above.    ALLERGIES: Allergies   Allergen Reactions  . Baclofen Other (See Comments)    Weakness, drowsiness    HOME MEDICATIONS: Outpatient Medications Prior to Visit  Medication Sig Dispense Refill  . clonazePAM (KLONOPIN) 0.5 MG tablet Take 1 tablet (0.5 mg total) by mouth 2 (two) times daily as needed for anxiety. 30 tablet 0  . divalproex (DEPAKOTE ER) 500 MG 24 hr tablet Take 1 tablet (500 mg total) by mouth at bedtime. 30 tablet 11  . DULoxetine (CYMBALTA) 60 MG capsule Take 60 mg by mouth daily.    Marland Kitchen loratadine (CLARITIN) 10 MG tablet Take 5 mg by mouth daily.    . traMADol (ULTRAM) 50 MG tablet Take 1 tablet (50 mg total) by mouth every 12 (twelve) hours as needed. 60 tablet 5  . CLONAZEPAM PO Take by mouth. 0.5 tablet at bedtime as needed for sleep.    . pantoprazole (PROTONIX) 40 MG tablet Take 1 tablet (40 mg total) by mouth daily.    . tamsulosin (FLOMAX) 0.4 MG CAPS capsule Take 2 capsules (0.8 mg total) by mouth daily. 30 capsule    No facility-administered medications prior to visit.     PAST MEDICAL HISTORY: Past Medical History:  Diagnosis Date  . Dysarthria   . Dystonia   . Gait disturbance   . Myelopathy ([place])   . Paraplegia ([place])     PAST SURGICAL HISTORY: Past Surgical History:  Procedure Laterality Date  . ANTERIOR CERVICAL DECOMP/DISCECTOMY FUSION N/A 10/04/2013   s/p C4-C5, C5-C6 anterior cervical diskectomy and fusion  . POSTERIOR CERVICAL FUSION/FORAMINOTOMY N/A 10/04/2013    posterior cervical fusion from C2-T2 with rods and screws and local bone graft and also decompressive  laminectomy from the bottom of C2 to the top of T1 with decompression of spinal cord and the nerve roots bilaterally performed by [name] on 10/04/13     FAMILY HISTORY: Family History  Problem Relation Age of Onset  . Heart disease Mother   . Cancer Mother   . Heart Problems Father   . High Cholesterol Sister   . High blood pressure Sister   . Diabetes Sister   . High blood pressure Sister      SOCIAL HISTORY: Social History   Socioeconomic History  . Marital status: Single    Spouse name: Not on file  . Number of children: 1  . Years of education: Not on file  . Highest education level: Not on file  Occupational History  . Occupation: retired    Comment: Biomedical scientist  . Financial resource strain: Not on file  . Food insecurity    Worry: Not on file    Inability: Not on file  . Transportation needs    Medical: Not on file    Non-medical: Not on file  Tobacco Use  . Smoking status: Former Smoker    Types: Cigarettes    Quit date: 02/01/2013    Years since quitting: 6.5  . Smokeless tobacco: Never Used  . Tobacco comment: quit   Substance and Sexual Activity  . Alcohol use: No  . Drug use: No  . Sexual activity: Not on file  Lifestyle  . Physical activity    Days per week: Not on file    Minutes per session: Not on file  . Stress: Not on file  Relationships  . Social Herbalist on phone: Not on file    Gets together: Not on file    Attends religious service: Not on file    Active member of club or organization: Not on file    Attends meetings of clubs or organizations: Not on file    Relationship status: Not on file  . Intimate partner violence    Fear of current or ex partner: Not on file    Emotionally abused: Not on file    Physically abused: Not on file    Forced sexual activity: Not on file  Other Topics Concern  . Not on file  Social History Narrative   Patient lives in independent living [place]. Patient is retired from [employer]. Patient has one child. Right handed. Patient is divorced.   Caffeine- 12 - Coffee daily - one daily -soda     PHYSICAL EXAM  Vitals:   08/08/19 1318  BP: 113/70  Pulse: 98  Temp: 98.3 F (36.8 C)  Weight: 141 lb (64 kg)  Height: 5\' 6"  (1.676 m)   Body mass index is 22.76 kg/m. Generalized: Well developed, in no acute distress  Head: normocephalic and atraumatic,. Oropharynx benign   Neck: Severe limited range of motion to the neck, difficulty turning to the right side, mild retrocollis, mild left tilt, right turn Cardiac: Regular rate rhythm, no murmur  Musculoskeletal: No deformity   Neurological examination   Mentation: Alert oriented to time, place, history taking. Attention span and concentration appropriate. Recent and remote memory intact. Follows all commands, he has moderate to severe spastic dysarthria.  Cranial nerve II-XII: Pupils were equal round reactive to light extraocular movements were full, visual field were full on confrontational test. Facial sensation and strength were normal. hearing was intact to finger rubbing bilaterally. Uvula tongue midline. head turning  and shoulder shrug were normal and symmetric.Tongue protrusion into cheek strength was normal. Motor: He has limited range of motion of left shoulder, no significant weakness bilateral upper extremity, moderate spasticity of bilateral lower extremity, Sensory: normal and symmetric to light touch, pinprick, and Vibration,  Coordination: finger-nose-finger, heel-to-shin bilaterally, no dysmetria Reflexes: 3+ upper lower and symmetric, plantar responses were extensor bilaterally. Gait and Station: Rely on his walker very stiff, steady gait  DIAGNOSTIC DATA (LABS, IMAGING, TESTING) -  ASSESSMENT AND PLAN 63 y.o. 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  High risk for seizure   EEG  Continue Depakote ER 500 mg every night for mood stabilizer, seizure prevention  Clonazepam 0.5 mg as needed for insomnia, tramadol as needed for pain  Cervical dystonia:   Limited range of motion of his neck, status post cervical posterior decompression fusion in 2013,  He has forceful neck turning towards the left side, with frequent left platysmas contraction,  No significant shoulder elevation, mild retrocollis, mild left turn,   EMG guided Xeomin injection for cervical dystonia, we used Xeomin 200 units   Left splenius capitis 25 units Left splenius cervix 25 units Left longissimus capitis 25 units Left upper trapezius 25 units  Right splenius capitis 25 units Right splenius cervix 25 units Right longissimus capitis 25 units Right upper trapezius 25 units        Marcial Pacas, M.D. Ph.D.  [facility] [place], [place] [address] Phone: [phone] Fax:      [phone]

## 2019-09-07 DIAGNOSIS — H26492 Other secondary cataract, left eye: Secondary | ICD-10-CM | POA: Diagnosis not present

## 2019-10-03 ENCOUNTER — Telehealth: Payer: Self-pay | Admitting: Neurology

## 2019-10-03 NOTE — Telephone Encounter (Signed)
lvm to r/s 1/11 appt due to MD being out

## 2019-10-04 ENCOUNTER — Encounter: Payer: Self-pay | Admitting: Neurology

## 2019-10-04 NOTE — Telephone Encounter (Signed)
lvm x2, sent pt a r/s letter

## 2019-10-11 ENCOUNTER — Telehealth: Payer: Self-pay | Admitting: Neurology

## 2019-10-11 NOTE — Telephone Encounter (Signed)
Patient called to reschedule their xeomin appointment and would like to get the next available if possible before the end of the year.  Please follow up

## 2019-10-12 NOTE — Telephone Encounter (Signed)
He has been placed on the schedule for 10/26/2019 at 1pm (requested he arrive for check-in at 12:45pm).    I returned the call to him.  I left a message on his voicemail with his appt details.  I provided our number to call back if this time does not work for him.

## 2019-10-14 NOTE — Telephone Encounter (Signed)
Noted, thank you so much for doing that [facility]!

## 2019-10-26 ENCOUNTER — Other Ambulatory Visit: Payer: Self-pay

## 2019-10-26 ENCOUNTER — Encounter: Payer: Self-pay | Admitting: Neurology

## 2019-10-26 ENCOUNTER — Ambulatory Visit (INDEPENDENT_AMBULATORY_CARE_PROVIDER_SITE_OTHER): Payer: Medicare Other | Admitting: Neurology

## 2019-10-26 ENCOUNTER — Telehealth: Payer: Self-pay | Admitting: Neurology

## 2019-10-26 VITALS — BP 127/71 | HR 81 | Ht 66.0 in | Wt 141.0 lb

## 2019-10-26 DIAGNOSIS — G243 Spasmodic torticollis: Secondary | ICD-10-CM

## 2019-10-26 MED ORDER — INCOBOTULINUMTOXINA 100 UNITS IM SOLR
200.0000 [IU] | INTRAMUSCULAR | Status: DC
  Administered 2019-10-26: 17:00:00 200 [IU] via INTRAMUSCULAR

## 2019-10-26 NOTE — Telephone Encounter (Signed)
noted 

## 2019-10-26 NOTE — Progress Notes (Signed)
**  Xeomin 100 units x 2 vials, NDC [id number], Lot [id number], Exp 07/2021, office supply.//[email or web address],rn**

## 2019-10-26 NOTE — Telephone Encounter (Signed)
Please change to Xeomin 100+50 units at next injection

## 2019-10-26 NOTE — Progress Notes (Signed)
[facility]  PATIENT: Patrick Daniel DOB: 1956/09/07   REASON FOR VISIT: Follow-up for paraplegia, cervical dystonia gait disturbance, dysarthria HISTORY FROM: Patient    HISTORY OF PRESENT ILLNESS: He has past medical history of hyperlipidemia, depression, He presented with gradual onset language difficulty, gait difficulty since 2009. He was evaluated by [facility] neurologist at [facility], reported normal MRI of brain, he was diagnosed with cervical dystonia, was given Botox injection of the cervical muscles, which has improved his symptoms temporarily.  He moved back to the [name] in April 2012, began to seek medical attention, initially evaluated by [name], reported again normal MRI of brain, also had trial of Haldol and baclofen without much improvement.  Sinemet did not help him. Artane and baclofen provide no help either. 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 He was evaluated by local neurosurgeon [name] at [facility], Because concomitant central nervous system degenerative disorder, cervical dystonia, he is hesitate to perform decompression surgery. I later referred him to [facility] neurology, evaluated by [name], who has suggested Botox injection to alleviate his cervical dystonia symptoms first, may consider cervical decompression surgery later. Also, likely there is a component of central nervous system degenerative disorder, such as spinocerebellar ataxia,.  He has significant dysarthria, also dysphagia, gait difficulty, frequent falling, he denies sensory changes. Normal or negative CMP, PEP, CBC, HIV, Lyme disease, SSA/B, TSH, ACE, RPR, CRP, CPK. He received first EMG guided Botox injection since  March 2012, he reported great improvement, he has been receiving BOTOX injection every 3 months, there is significant improvement of his abnormal neck postural, but still with severe retrocollis, left turn, right tilt. he began to have moderate improvement after repeat injection. He was evaluated by his neurosurgeon [name] at [facility], repeat MRI cervical showed advanced multilevel degenerative changes of the cervical spine, most pronounced at C5-C6, where there is severe central stenosis, impression on the cord with focal myelomalacia and severe bilateral neuroforaminal narrowing. Overall appearance is similar to prior study from 09/18/2010. He had posterior decompression and fusion surgery in December 2013, recovering well, but complains of neck muscle spasm, and chest muscle spasm continued spastic gait difficulty, occasional urinary incontinence, his posture is much improved, but has limited range of motion of his neck  He had EMG guided BOTOX injection by me since March 2012, most recent EMG guided Botox injection was by [name] April 10 2015,300 units of Botox A  He continued to have unsteady gait, difficulty finding a comfortable position, urinary urgency, he denies constipation, denies upper extremity paresthesia or weakness, slow worsening dysarthria,   MRI of the brain in 2016 showed generalized atrophy, most prominent in vermis,  genetic or degenerative processes that may present with both ataxia and dystonia such as the spinal cerebellar ataxias and OPCA.   Additional note is made of a few small T2/flair hyperintense foci consistent with minimal small vessel ischemic changes. MRI of cervical spine: Prior C4-C6 ACDF and C2-T2 posterior fusion. The vertebral bodies are normally aligned.   Metal artifact from these fusions obscures many of the images. Multilevel cervical degenerative disc disease, most significant findings are at C5-C6. There appears to be a posterior bony ridge that  mildly narrows the neural canal but does not appear to be causing spinal  cord compression. However, there is possible foraminal narrowing to the right that could lead to some right C6 nerve root impingement.    MRI lumbar in December 2017 no significant abnormality   he reported long history of heavy alcohol abuse for more than 20 years,  He has continued EMG guided botulinum toxin injection for his cervical dystonia over the past few years, last injection was in February 2020,  He was admitted to the hospital on February 11, 2019 after he fell in his apartment lying on the floor for unknown period of time, upon admission, elevated CPK 1988.  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, he was seen by ID consult, acyclovir treatment for 21 days, he also suffered acute kidney failure, that recovered to normal with creatinine of 0.7 upon discharge, he also had a urinary retention, still using Foley, Flomax, was seen by urologist, doubt he is a candidate for suprapubic catheter, he has worsening gait abnormality, can no longer ambulate without assistant, no bowel incontinence, he also suffered left humeral neck fracture, still has limited range of motion of left arm  He was found to be anemia, hemoglobin dropped to 6, required blood transfusion, suspicious for GI bleeding, further work-up is pending, upon discharge, hemoglobin was 10 on Mar 02, 2019,   He is now home with his daughter staying wheelchair, complains of left shoulder pain  UPDATE Aug 08 2019: He continues to make recovery, is able to ambulate with walker, continue have Foley catheter, has significant neck pain, difficulty sleeping  Update October 26, 2019: He responded well to previous injection  REVIEW OF SYSTEMS: Full 14 system review of systems performed and notable only for those listed,  all others are neg:  As above.    ALLERGIES: Allergies  Allergen Reactions  . Baclofen Other (See Comments)    Weakness, drowsiness    HOME MEDICATIONS: Outpatient Medications Prior to Visit  Medication Sig Dispense Refill  . clonazePAM (KLONOPIN) 0.5 MG tablet Take 1 tablet (0.5 mg total) by mouth 2 (two) times daily as needed for anxiety. 30 tablet 5  . divalproex (DEPAKOTE ER) 500 MG 24 hr tablet Take 1 tablet (500 mg total) by mouth at bedtime. 30 tablet 11  . DULoxetine (CYMBALTA) 60 MG capsule Take 60 mg by mouth daily.    Marland Kitchen loratadine (CLARITIN) 10 MG tablet Take 5 mg by mouth daily.    . traMADol (ULTRAM) 50 MG tablet Take 1 tablet (50 mg total) by mouth every 12 (twelve) hours as needed. 60 tablet 5   Facility-Administered Medications Prior to Visit  Medication Dose Route Frequency Provider Last Rate Last Admin  . incobotulinumtoxinA (XEOMIN) 100 units injection 200 Units  200 Units Intramuscular Q90 days Marcial Pacas, MD   200 Units at 08/08/19 1719    PAST MEDICAL HISTORY: Past Medical History:  Diagnosis Date  . Dysarthria   . Dystonia   . Gait disturbance   . Myelopathy ([place])   . Paraplegia ([place])     PAST SURGICAL HISTORY: Past Surgical History:  Procedure Laterality Date  . ANTERIOR CERVICAL DECOMP/DISCECTOMY FUSION N/A 10/04/2013   s/p C4-C5, C5-C6 anterior cervical diskectomy and fusion  . POSTERIOR CERVICAL FUSION/FORAMINOTOMY N/A 10/04/2013    posterior cervical fusion from C2-T2 with rods and screws and local bone graft and also decompressive laminectomy from the bottom of C2 to the top of T1  with decompression of spinal cord and the nerve roots bilaterally performed by [name] on 10/04/13     FAMILY HISTORY: Family History  Problem Relation Age of Onset  . Heart disease Mother   . Cancer Mother   . Heart Problems Father   . High Cholesterol Sister   . High blood pressure Sister   . Diabetes Sister   . High blood pressure Sister     SOCIAL  HISTORY: Social History   Socioeconomic History  . Marital status: Single    Spouse name: Not on file  . Number of children: 1  . Years of education: Not on file  . Highest education level: Not on file  Occupational History  . Occupation: retired    Comment: Chief Technology Officer  . Smoking status: Former Smoker    Types: Cigarettes    Quit date: 02/01/2013    Years since quitting: 6.7  . Smokeless tobacco: Never Used  . Tobacco comment: quit   Substance and Sexual Activity  . Alcohol use: No  . Drug use: No  . Sexual activity: Not on file  Other Topics Concern  . Not on file  Social History Narrative   Patient lives in independent living [place]. Patient is retired from [employer]. Patient has one child. Right handed. Patient is divorced.   Caffeine- 12 - Coffee daily - one daily -soda   Social Determinants of Health   Financial Resource Strain:   . Difficulty of Paying Living Expenses: Not on file  Food Insecurity:   . Worried About Charity fundraiser in the Last Year: Not on file  . Ran Out of Food in the Last Year: Not on file  Transportation Needs:   . Lack of Transportation (Medical): Not on file  . Lack of Transportation (Non-Medical): Not on file  Physical Activity:   . Days of Exercise per Week: Not on file  . Minutes of Exercise per Session: Not on file  Stress:   . Feeling of Stress : Not on file  Social Connections:   . Frequency of Communication with Friends and Family: Not on file  . Frequency of Social Gatherings with Friends and Family: Not on file  . Attends Religious Services: Not on file  . Active Member of Clubs or Organizations: Not on file  . Attends Archivist Meetings: Not on file  . Marital Status: Not on file  Intimate Partner Violence:   . Fear of Current or Ex-Partner: Not on file  . Emotionally Abused: Not on file  . Physically Abused: Not on file  . Sexually Abused: Not on file     PHYSICAL EXAM  Mentation: he has  moderate to severe spastic dysarthria.  Mild left shoulder elevation, mild right turn, retrocollis  DIAGNOSTIC DATA (LABS, IMAGING, TESTING) -  ASSESSMENT AND PLAN 63 y.o. 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  High risk for seizure   EEG  Continue Depakote ER 500 mg every night for mood stabilizer, seizure prevention  Clonazepam 0.5 mg as needed for insomnia, tramadol as needed for pain  Cervical dystonia:  Limited range of motion of his neck, status post cervical posterior decompression fusion in 2013,  He has  forceful neck turning towards the left side, with frequent left platysmas contraction,  No significant shoulder elevation, mild retrocollis, mild left turn,   EMG guided Xeomin injection for cervical dystonia, we used Xeomin 200 units   Left splenius capitis 25 units Left splenius cervix 25 units Left longissimus capitis 25 units Left sternocleidomastoid 25 units  Right splenius capitis 25 units Right splenius cervix 25 units Right longissimus capitis 25 units Right levator scapular 25 units        Marcial Pacas, M.D. Ph.D.  [facility] [place], [place] [address] Phone: [phone] Fax:      [phone]

## 2019-10-31 NOTE — Telephone Encounter (Signed)
Noted, thank you. DW  °

## 2019-11-07 ENCOUNTER — Ambulatory Visit: Payer: Self-pay | Admitting: Neurology

## 2019-11-09 ENCOUNTER — Ambulatory Visit: Payer: Self-pay | Admitting: Neurology

## 2020-01-05 ENCOUNTER — Telehealth: Payer: Self-pay | Admitting: Neurology

## 2020-01-05 ENCOUNTER — Encounter: Payer: Self-pay | Admitting: *Deleted

## 2020-01-05 MED ORDER — DULOXETINE HCL 60 MG PO CPEP: 60.0000 mg | ORAL_CAPSULE | Freq: Every day | ORAL | 11 refills | Status: DC

## 2020-01-05 NOTE — Telephone Encounter (Signed)
1) Medication(s) Requested (by name): DULoxetine (CYMBALTA) 60 MG capsule   2) Pharmacy of Choice: [facility] on [address] phone[address][phone]

## 2020-01-05 NOTE — Telephone Encounter (Signed)
Duloxetine refilled per previous Rx. Sent patient my chart to advise.

## 2020-02-01 ENCOUNTER — Encounter: Payer: Self-pay | Admitting: Neurology

## 2020-02-01 ENCOUNTER — Ambulatory Visit (INDEPENDENT_AMBULATORY_CARE_PROVIDER_SITE_OTHER): Payer: Medicare Other | Admitting: Neurology

## 2020-02-01 ENCOUNTER — Other Ambulatory Visit: Payer: Self-pay

## 2020-02-01 VITALS — BP 132/82 | HR 67 | Temp 96.9°F | Ht 66.0 in | Wt 145.0 lb

## 2020-02-01 DIAGNOSIS — G243 Spasmodic torticollis: Secondary | ICD-10-CM

## 2020-02-01 MED ORDER — INCOBOTULINUMTOXINA 100 UNITS IM SOLR
150.0000 [IU] | INTRAMUSCULAR | Status: DC
  Administered 2020-02-01: 150 [IU] via INTRAMUSCULAR

## 2020-02-01 NOTE — Progress Notes (Signed)
**  Xeomin 100 units x 1 vial, NDC 0259-1610-01, Lot [id number], Exp 09/2021, Xeomin 50 units x 1 vial, NDC 0259-1605-01, Lot [id number], Exp 09/2021, total of 150 units, office supply.//mck,rn**

## 2020-02-01 NOTE — Progress Notes (Signed)
[facility]  PATIENT: Patrick Daniel DOB: 1956/09/07   REASON FOR VISIT: Follow-up for paraplegia, cervical dystonia gait disturbance, dysarthria HISTORY FROM: Patient    HISTORY OF PRESENT ILLNESS: He has past medical history of hyperlipidemia, depression, He presented with gradual onset language difficulty, gait difficulty since 2009. He was evaluated by [facility] neurologist at [facility], reported normal MRI of brain, he was diagnosed with cervical dystonia, was given Botox injection of the cervical muscles, which has improved his symptoms temporarily.  He moved back to the [name] in April 2012, began to seek medical attention, initially evaluated by [name], reported again normal MRI of brain, also had trial of Haldol and baclofen without much improvement.  Sinemet did not help him. Artane and baclofen provide no help either. 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 He was evaluated by local neurosurgeon [name] at [facility], Because concomitant central nervous system degenerative disorder, cervical dystonia, he is hesitate to perform decompression surgery. I later referred him to [facility] neurology, evaluated by [name], who has suggested Botox injection to alleviate his cervical dystonia symptoms first, may consider cervical decompression surgery later. Also, likely there is a component of central nervous system degenerative disorder, such as spinocerebellar ataxia,.  He has significant dysarthria, also dysphagia, gait difficulty, frequent falling, he denies sensory changes. Normal or negative CMP, PEP, CBC, HIV, Lyme disease, SSA/B, TSH, ACE, RPR, CRP, CPK. He received first EMG guided Botox injection since  March 2012, he reported great improvement, he has been receiving BOTOX injection every 3 months, there is significant improvement of his abnormal neck postural, but still with severe retrocollis, left turn, right tilt. he began to have moderate improvement after repeat injection. He was evaluated by his neurosurgeon [name] at [facility], repeat MRI cervical showed advanced multilevel degenerative changes of the cervical spine, most pronounced at C5-C6, where there is severe central stenosis, impression on the cord with focal myelomalacia and severe bilateral neuroforaminal narrowing. Overall appearance is similar to prior study from 09/18/2010. He had posterior decompression and fusion surgery in December 2013, recovering well, but complains of neck muscle spasm, and chest muscle spasm continued spastic gait difficulty, occasional urinary incontinence, his posture is much improved, but has limited range of motion of his neck  He had EMG guided BOTOX injection by me since March 2012, most recent EMG guided Botox injection was by [name] April 10 2015,300 units of Botox A  He continued to have unsteady gait, difficulty finding a comfortable position, urinary urgency, he denies constipation, denies upper extremity paresthesia or weakness, slow worsening dysarthria,   MRI of the brain in 2016 showed generalized atrophy, most prominent in vermis,  genetic or degenerative processes that may present with both ataxia and dystonia such as the spinal cerebellar ataxias and OPCA.   Additional note is made of a few small T2/flair hyperintense foci consistent with minimal small vessel ischemic changes. MRI of cervical spine: Prior C4-C6 ACDF and C2-T2 posterior fusion. The vertebral bodies are normally aligned.   Metal artifact from these fusions obscures many of the images. Multilevel cervical degenerative disc disease, most significant findings are at C5-C6. There appears to be a posterior bony ridge that  mildly narrows the neural canal but does not appear to be causing spinal  cord compression. However, there is possible foraminal narrowing to the right that could lead to some right C6 nerve root impingement.    MRI lumbar in December 2017 no significant abnormality   he reported long history of heavy alcohol abuse for more than 20 years,  He has continued EMG guided botulinum toxin injection for his cervical dystonia over the past few years, last injection was in February 2020,  He was admitted to the hospital on February 11, 2019 after he fell in his apartment lying on the floor for unknown period of time, upon admission, elevated CPK 1988.  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, he was seen by ID consult, acyclovir treatment for 21 days, he also suffered acute kidney failure, that recovered to normal with creatinine of 0.7 upon discharge, he also had a urinary retention, still using Foley, Flomax, was seen by urologist, doubt he is a candidate for suprapubic catheter, he has worsening gait abnormality, can no longer ambulate without assistant, no bowel incontinence, he also suffered left humeral neck fracture, still has limited range of motion of left arm  He was found to be anemia, hemoglobin dropped to 6, required blood transfusion, suspicious for GI bleeding, further work-up is pending, upon discharge, hemoglobin was 10 on Mar 02, 2019,   He is now home with his daughter staying wheelchair, complains of left shoulder pain  UPDATE Aug 08 2019: He continues to make recovery, is able to ambulate with walker, continue have Foley catheter, has significant neck pain, difficulty sleeping  Update October 26, 2019: He responded well to previous injection  UPDATE April 7th 2021: He lives with his daughter, overall doing well, slow worsening gait  abnormality, no longer have significant neck pain, but complains of neck muscle stiffness.  REVIEW OF SYSTEMS: Full 14 system review of systems performed and notable only for those listed, all others are neg:  As above.    ALLERGIES: Allergies  Allergen Reactions  . Baclofen Other (See Comments)    Weakness, drowsiness    HOME MEDICATIONS: Outpatient Medications Prior to Visit  Medication Sig Dispense Refill  . clonazePAM (KLONOPIN) 0.5 MG tablet Take 1 tablet (0.5 mg total) by mouth 2 (two) times daily as needed for anxiety. 30 tablet 5  . divalproex (DEPAKOTE ER) 500 MG 24 hr tablet Take 1 tablet (500 mg total) by mouth at bedtime. 30 tablet 11  . DULoxetine (CYMBALTA) 60 MG capsule Take 1 capsule (60 mg total) by mouth daily. 30 capsule 11  . IncobotulinumtoxinA (XEOMIN IM) Inject 150 Units into the muscle every 3 (three) months.    . loratadine (CLARITIN) 10 MG tablet Take 5 mg by mouth daily.    . traMADol (ULTRAM) 50 MG tablet Take 1 tablet (50 mg total) by mouth every 12 (twelve) hours as needed. 60 tablet 5  . incobotulinumtoxinA (XEOMIN) 100 units injection 200 Units      No facility-administered medications prior to visit.    PAST MEDICAL HISTORY: Past Medical History:  Diagnosis Date  . Dysarthria   . Dystonia   . Gait disturbance   . Myelopathy ([place])   . Paraplegia ([place])     PAST SURGICAL HISTORY: Past Surgical History:  Procedure Laterality Date  . ANTERIOR CERVICAL DECOMP/DISCECTOMY FUSION N/A 10/04/2013   s/p C4-C5, C5-C6 anterior cervical diskectomy and fusion  . POSTERIOR CERVICAL FUSION/FORAMINOTOMY N/A 10/04/2013  posterior cervical fusion from C2-T2 with rods and screws and local bone graft and also decompressive laminectomy from the bottom of C2 to the top of T1 with decompression of spinal cord and the nerve roots bilaterally performed by [name] on 10/04/13     FAMILY HISTORY: Family History  Problem Relation Age of Onset  . Heart disease  Mother   . Cancer Mother   . Heart Problems Father   . High Cholesterol Sister   . High blood pressure Sister   . Diabetes Sister   . High blood pressure Sister     SOCIAL HISTORY: Social History   Socioeconomic History  . Marital status: Single    Spouse name: Not on file  . Number of children: 1  . Years of education: Not on file  . Highest education level: Not on file  Occupational History  . Occupation: retired    Comment: Chief Technology Officer  . Smoking status: Former Smoker    Types: Cigarettes    Quit date: 02/01/2013    Years since quitting: 7.0  . Smokeless tobacco: Never Used  . Tobacco comment: quit   Substance and Sexual Activity  . Alcohol use: No  . Drug use: No  . Sexual activity: Not on file  Other Topics Concern  . Not on file  Social History Narrative   Patient lives in independent living [place]. Patient is retired from [employer]. Patient has one child. Right handed. Patient is divorced.   Caffeine- 12 - Coffee daily - one daily -soda   Social Determinants of Health   Financial Resource Strain:   . Difficulty of Paying Living Expenses:   Food Insecurity:   . Worried About Charity fundraiser in the Last Year:   . Arboriculturist in the Last Year:   Transportation Needs:   . Film/video editor (Medical):   Marland Kitchen Lack of Transportation (Non-Medical):   Physical Activity:   . Days of Exercise per Week:   . Minutes of Exercise per Session:   Stress:   . Feeling of Stress :   Social Connections:   . Frequency of Communication with Friends and Family:   . Frequency of Social Gatherings with Friends and Family:   . Attends Religious Services:   . Active Member of Clubs or Organizations:   . Attends Archivist Meetings:   Marland Kitchen Marital Status:   Intimate Partner Violence:   . Fear of Current or Ex-Partner:   . Emotionally Abused:   Marland Kitchen Physically Abused:   . Sexually Abused:      PHYSICAL EXAM  Mentation: he has moderate to severe  spastic dysarthria.  Mild left shoulder elevation, mild right turn, retrocollis  DIAGNOSTIC DATA (LABS, IMAGING, TESTING) -  ASSESSMENT AND PLAN 64 y.o. 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  High risk for seizure   EEG  Continue Depakote ER 500 mg every night for mood stabilizer, seizure prevention  Clonazepam 0.5 mg as needed for insomnia, tramadol as needed for pain  Cervical dystonia:  Limited range of motion of his neck, status post cervical posterior decompression fusion in 2013,  He has limited range of motion of neck, moderate left turn,  mild retrocollis, left anterocollis   EMG guided Xeomin injection for cervical dystonia, we used Xeomin 150 units   Left splenius capitis 25 units Left splenius cervix 25 units Left longissimus capitis 25 units  Right splenius capitis 25 units Right splenius cervix 25 units Right longissimus capitis 25 units     Marcial Pacas, M.D. Ph.D.  [facility] [place], [place] [address] Phone: [phone] Fax:      [phone]

## 2020-02-22 IMAGING — CR LEFT HUMERUS - 2+ VIEW
2 series · 2 of 2 positions shown · non-contrast
Comparison: None.

CLINICAL DATA: Found on floor with left arm pain.

EXAM:
LEFT HUMERUS - 2+ VIEW

[humerus ap]
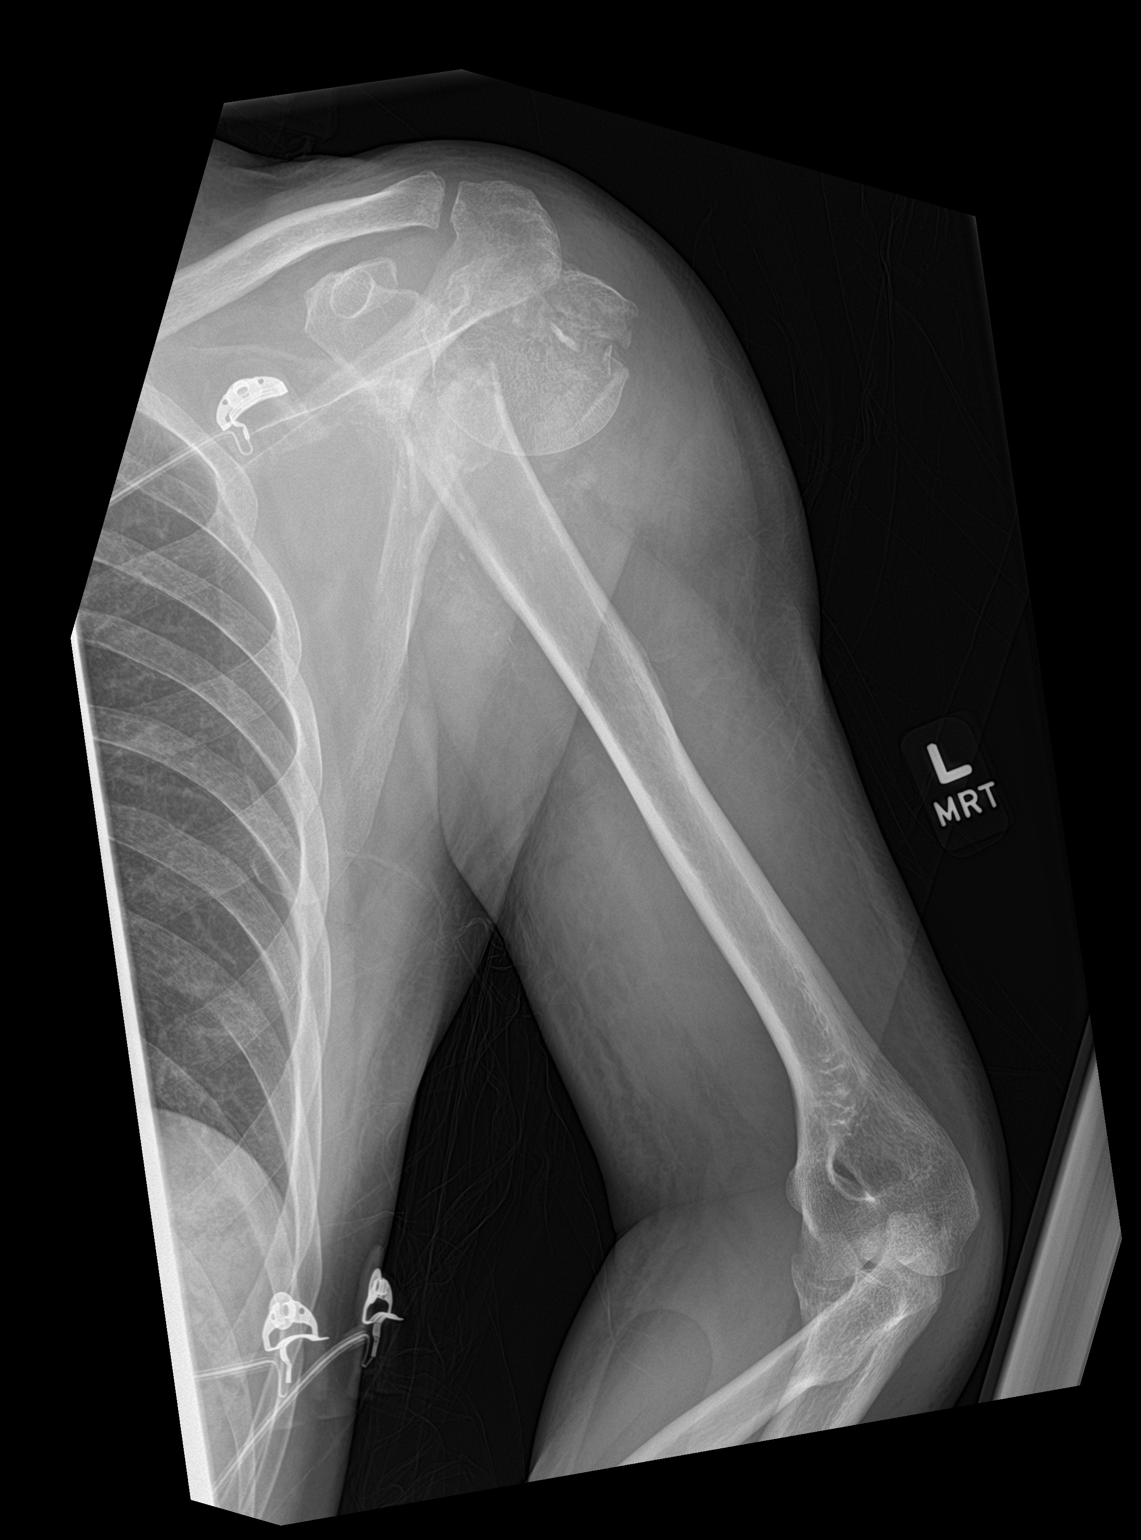

[humerus lat]
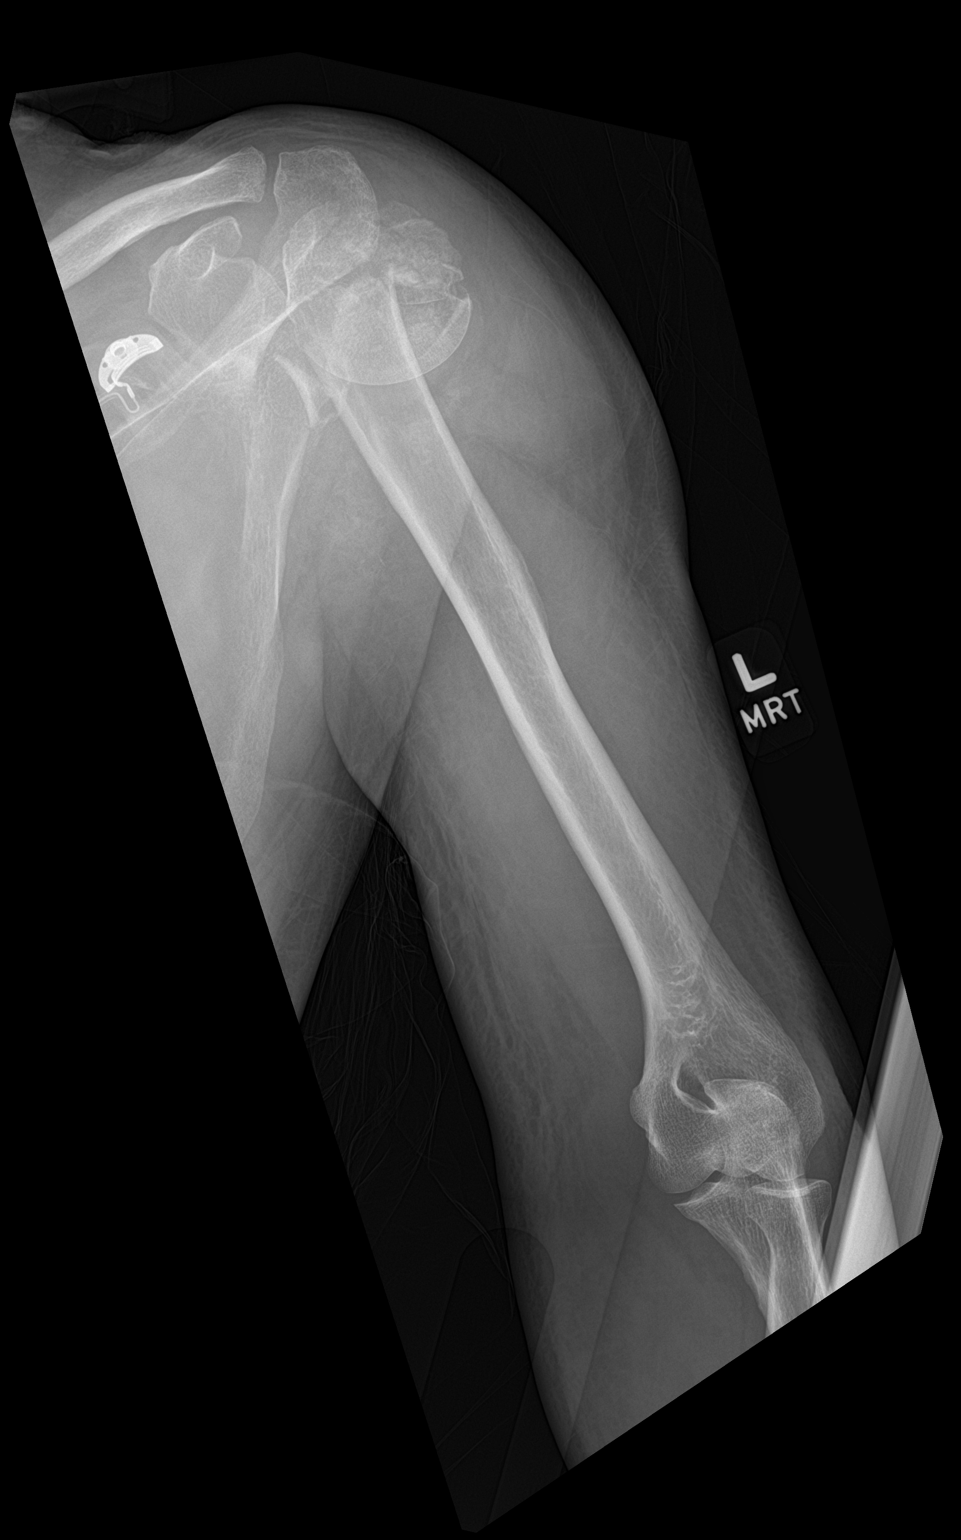

[2 of 2 positions shown; findings below may reference images not displayed]

FINDINGS: Exam demonstrates a moderately displaced left humeral neck fracture
with mild comminution and extending into the humeral head with mild
comminution of the humeral head. There is a fracture fragment
adjacent the medial aspect of the humeral neck likely from the
humeral fracture, although cannot completely exclude an adjacent
glenoid fracture. Remainder the exam is unremarkable.
IMPRESSION: Moderately displaced mildly comminuted left humeral neck fracture
with extension into the humeral head with mild comminution of the
humeral head. It would be difficult to exclude an adjacent glenoid
fracture.

## 2020-02-25 ENCOUNTER — Other Ambulatory Visit: Payer: Self-pay | Admitting: Neurology

## 2020-02-27 ENCOUNTER — Other Ambulatory Visit: Payer: Self-pay | Admitting: Neurology

## 2020-02-27 MED ORDER — TRAMADOL HCL 50 MG PO TABS: 50.0000 mg | ORAL_TABLET | Freq: Two times a day (BID) | ORAL | 0 refills | Status: DC | PRN

## 2020-02-27 NOTE — Telephone Encounter (Signed)
Pt daughter(Randol, christina) has called for a refill on  clonazePAM (KLONOPIN) 0.5 MG tablet & traMADol (ULTRAM) 50 MG tablet to   [facility] [phone]

## 2020-02-27 NOTE — Addendum Note (Signed)
Addended by: Noberto Retort C on: 02/27/2020 03:26 PM   Modules accepted: Orders

## 2020-02-27 NOTE — Telephone Encounter (Signed)
Clonazepam refill sent 02/27/20. Tramadol sent to Dr. Krista Blue for approval today.

## 2020-03-16 DIAGNOSIS — R479 Unspecified speech disturbances: Secondary | ICD-10-CM | POA: Diagnosis not present

## 2020-03-16 DIAGNOSIS — N4889 Other specified disorders of penis: Secondary | ICD-10-CM | POA: Diagnosis not present

## 2020-03-16 DIAGNOSIS — R1084 Generalized abdominal pain: Secondary | ICD-10-CM | POA: Diagnosis not present

## 2020-03-16 DIAGNOSIS — R319 Hematuria, unspecified: Secondary | ICD-10-CM | POA: Diagnosis not present

## 2020-03-16 DIAGNOSIS — N3001 Acute cystitis with hematuria: Secondary | ICD-10-CM | POA: Diagnosis not present

## 2020-03-16 DIAGNOSIS — R531 Weakness: Secondary | ICD-10-CM | POA: Diagnosis not present

## 2020-03-16 DIAGNOSIS — K8020 Calculus of gallbladder without cholecystitis without obstruction: Secondary | ICD-10-CM | POA: Diagnosis not present

## 2020-03-16 DIAGNOSIS — Z87891 Personal history of nicotine dependence: Secondary | ICD-10-CM | POA: Diagnosis not present

## 2020-03-20 ENCOUNTER — Other Ambulatory Visit: Payer: Self-pay | Admitting: Neurology

## 2020-04-04 DIAGNOSIS — R339 Retention of urine, unspecified: Secondary | ICD-10-CM | POA: Diagnosis not present

## 2020-04-17 DIAGNOSIS — R339 Retention of urine, unspecified: Secondary | ICD-10-CM | POA: Diagnosis not present

## 2020-05-09 ENCOUNTER — Other Ambulatory Visit: Payer: Self-pay

## 2020-05-09 ENCOUNTER — Encounter: Payer: Self-pay | Admitting: Neurology

## 2020-05-09 ENCOUNTER — Telehealth: Payer: Self-pay | Admitting: Neurology

## 2020-05-09 ENCOUNTER — Ambulatory Visit (INDEPENDENT_AMBULATORY_CARE_PROVIDER_SITE_OTHER): Payer: Medicare Other | Admitting: Neurology

## 2020-05-09 VITALS — BP 153/88 | HR 98 | Ht 66.0 in | Wt 139.5 lb

## 2020-05-09 DIAGNOSIS — G243 Spasmodic torticollis: Secondary | ICD-10-CM | POA: Diagnosis not present

## 2020-05-09 MED ORDER — INCOBOTULINUMTOXINA 100 UNITS IM SOLR
100.0000 [IU] | INTRAMUSCULAR | Status: DC
  Administered 2020-05-09: 100 [IU] via INTRAMUSCULAR

## 2020-05-09 MED ORDER — INCOBOTULINUMTOXINA 50 UNITS IM SOLR
50.0000 [IU] | INTRAMUSCULAR | Status: DC
  Administered 2020-05-09: 50 [IU] via INTRAMUSCULAR

## 2020-05-09 NOTE — Progress Notes (Signed)
**  Xeomin 100 units x 1 vial, [facility] 2575-0518-33, Lot [id number], Exp 06/2021, Xeomin 50 units x 1 vial, [place] 984-2103-12, Lot [id number], Exp 01/2022, office supply.//[email or web address]**

## 2020-05-09 NOTE — Progress Notes (Signed)
[facility]  PATIENT: Patrick Daniel DOB: 1956/09/07   REASON FOR VISIT: Follow-up for paraplegia, cervical dystonia gait disturbance, dysarthria HISTORY FROM: Patient    HISTORY OF PRESENT ILLNESS: He has past medical history of hyperlipidemia, depression, He presented with gradual onset language difficulty, gait difficulty since 2009. He was evaluated by [facility] neurologist at [facility], reported normal MRI of brain, he was diagnosed with cervical dystonia, was given Botox injection of the cervical muscles, which has improved his symptoms temporarily.  He moved back to the [name] in April 2012, began to seek medical attention, initially evaluated by [name], reported again normal MRI of brain, also had trial of Haldol and baclofen without much improvement.  Sinemet did not help him. Artane and baclofen provide no help either. 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 He was evaluated by local neurosurgeon [name] at [facility], Because concomitant central nervous system degenerative disorder, cervical dystonia, he is hesitate to perform decompression surgery. I later referred him to [facility] neurology, evaluated by [name], who has suggested Botox injection to alleviate his cervical dystonia symptoms first, may consider cervical decompression surgery later. Also, likely there is a component of central nervous system degenerative disorder, such as spinocerebellar ataxia,.  He has significant dysarthria, also dysphagia, gait difficulty, frequent falling, he denies sensory changes. Normal or negative CMP, PEP, CBC, HIV, Lyme disease, SSA/B, TSH, ACE, RPR, CRP, CPK. He received first EMG guided Botox injection since  March 2012, he reported great improvement, he has been receiving BOTOX injection every 3 months, there is significant improvement of his abnormal neck postural, but still with severe retrocollis, left turn, right tilt. he began to have moderate improvement after repeat injection. He was evaluated by his neurosurgeon [name] at [facility], repeat MRI cervical showed advanced multilevel degenerative changes of the cervical spine, most pronounced at C5-C6, where there is severe central stenosis, impression on the cord with focal myelomalacia and severe bilateral neuroforaminal narrowing. Overall appearance is similar to prior study from 09/18/2010. He had posterior decompression and fusion surgery in December 2013, recovering well, but complains of neck muscle spasm, and chest muscle spasm continued spastic gait difficulty, occasional urinary incontinence, his posture is much improved, but has limited range of motion of his neck  He had EMG guided BOTOX injection by me since March 2012, most recent EMG guided Botox injection was by [name] April 10 2015,300 units of Botox A  He continued to have unsteady gait, difficulty finding a comfortable position, urinary urgency, he denies constipation, denies upper extremity paresthesia or weakness, slow worsening dysarthria,   MRI of the brain in 2016 showed generalized atrophy, most prominent in vermis,  genetic or degenerative processes that may present with both ataxia and dystonia such as the spinal cerebellar ataxias and OPCA.   Additional note is made of a few small T2/flair hyperintense foci consistent with minimal small vessel ischemic changes. MRI of cervical spine: Prior C4-C6 ACDF and C2-T2 posterior fusion. The vertebral bodies are normally aligned.   Metal artifact from these fusions obscures many of the images. Multilevel cervical degenerative disc disease, most significant findings are at C5-C6. There appears to be a posterior bony ridge that  mildly narrows the neural canal but does not appear to be causing spinal  cord compression. However, there is possible foraminal narrowing to the right that could lead to some right C6 nerve root impingement.    MRI lumbar in December 2017 no significant abnormality   he reported long history of heavy alcohol abuse for more than 20 years,  He has continued EMG guided botulinum toxin injection for his cervical dystonia over the past few years, last injection was in February 2020,  He was admitted to the hospital on February 11, 2019 after he fell in his apartment lying on the floor for unknown period of time, upon admission, elevated CPK 1988.  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, he was seen by ID consult, acyclovir treatment for 21 days, he also suffered acute kidney failure, that recovered to normal with creatinine of 0.7 upon discharge, he also had a urinary retention, still using Foley, Flomax, was seen by urologist, doubt he is a candidate for suprapubic catheter, he has worsening gait abnormality, can no longer ambulate without assistant, no bowel incontinence, he also suffered left humeral neck fracture, still has limited range of motion of left arm  He was found to be anemia, hemoglobin dropped to 6, required blood transfusion, suspicious for GI bleeding, further work-up is pending, upon discharge, hemoglobin was 10 on Mar 02, 2019,   He is now home with his daughter staying wheelchair, complains of left shoulder pain  UPDATE Aug 08 2019: He continues to make recovery, is able to ambulate with walker, continue have Foley catheter, has significant neck pain, difficulty sleeping  Update October 26, 2019: He responded well to previous injection  UPDATE April 7th 2021: He lives with his daughter, overall doing well, slow worsening gait  abnormality, no longer have significant neck pain, but complains of neck muscle stiffness.  UPDATE May 09 2020: He continue complains of worsening gait abnormality, spasticity, limited range of motion of neck, neck stiffness, but he no longer have significant abnormal neck pulling, abnormal neck posturing  REVIEW OF SYSTEMS: Full 14 system review of systems performed and notable only for those listed, all others are neg:  As above.    ALLERGIES: Allergies  Allergen Reactions  . Baclofen Other (See Comments)    Weakness, drowsiness    HOME MEDICATIONS: Outpatient Medications Prior to Visit  Medication Sig Dispense Refill  . clonazePAM (KLONOPIN) 0.5 MG tablet TAKE 1 TABLET BY MOUTH TWICE DAILY AS NEEDED FOR ANXIETY 30 tablet 5  . divalproex (DEPAKOTE ER) 500 MG 24 hr tablet TAKE 1 TABLET(500 MG) BY MOUTH AT BEDTIME 30 tablet 11  . DULoxetine (CYMBALTA) 60 MG capsule Take 1 capsule (60 mg total) by mouth daily. 30 capsule 11  . IncobotulinumtoxinA (XEOMIN IM) Inject 150 Units into the muscle every 3 (three) months.    . loratadine (CLARITIN) 10 MG tablet Take 5 mg by mouth daily.    . traMADol (ULTRAM) 50 MG tablet Take 1 tablet (50 mg total) by mouth every 12 (twelve) hours as needed. 60 tablet 0  . incobotulinumtoxinA (XEOMIN) 100 units injection 150 Units      No facility-administered medications prior to visit.    PAST MEDICAL HISTORY: Past Medical History:  Diagnosis Date  . Dysarthria   . Dystonia   . Gait disturbance   . Myelopathy ([place])   . Paraplegia ([place])     PAST SURGICAL HISTORY: Past Surgical History:  Procedure Laterality Date  .  ANTERIOR CERVICAL DECOMP/DISCECTOMY FUSION N/A 10/04/2013   s/p C4-C5, C5-C6 anterior cervical diskectomy and fusion  . POSTERIOR CERVICAL FUSION/FORAMINOTOMY N/A 10/04/2013    posterior cervical fusion from C2-T2 with rods and screws and local bone graft and also decompressive laminectomy from the bottom of C2 to the top of T1  with decompression of spinal cord and the nerve roots bilaterally performed by [name] on 10/04/13     FAMILY HISTORY: Family History  Problem Relation Age of Onset  . Heart disease Mother   . Cancer Mother   . Heart Problems Father   . High Cholesterol Sister   . High blood pressure Sister   . Diabetes Sister   . High blood pressure Sister     SOCIAL HISTORY: Social History   Socioeconomic History  . Marital status: Single    Spouse name: Not on file  . Number of children: 1  . Years of education: Not on file  . Highest education level: Not on file  Occupational History  . Occupation: retired    Comment: Chief Technology Officer  . Smoking status: Former Smoker    Types: Cigarettes    Quit date: 02/01/2013    Years since quitting: 7.2  . Smokeless tobacco: Never Used  . Tobacco comment: quit   Substance and Sexual Activity  . Alcohol use: No  . Drug use: No  . Sexual activity: Not on file  Other Topics Concern  . Not on file  Social History Narrative   Patient lives in independent living [place]. Patient is retired from [employer]. Patient has one child. Right handed. Patient is divorced.   Caffeine- 12 - Coffee daily - one daily -soda   Social Determinants of Health   Financial Resource Strain:   . Difficulty of Paying Living Expenses:   Food Insecurity:   . Worried About Charity fundraiser in the Last Year:   . Arboriculturist in the Last Year:   Transportation Needs:   . Film/video editor (Medical):   Marland Kitchen Lack of Transportation (Non-Medical):   Physical Activity:   . Days of Exercise per Week:   . Minutes of Exercise per Session:   Stress:   . Feeling of Stress :   Social Connections:   . Frequency of Communication with Friends and Family:   . Frequency of Social Gatherings with Friends and Family:   . Attends Religious Services:   . Active Member of Clubs or Organizations:   . Attends Archivist Meetings:   Marland Kitchen Marital Status:   Intimate  Partner Violence:   . Fear of Current or Ex-Partner:   . Emotionally Abused:   Marland Kitchen Physically Abused:   . Sexually Abused:      PHYSICAL EXAM  Mentation: he has moderate to severe spastic dysarthria.  Mild left shoulder elevation, mild right turn, retrocollis  DIAGNOSTIC DATA (LABS, IMAGING, TESTING) -  ASSESSMENT AND PLAN 64 y.o. 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  High risk for seizure   EEG  Continue Depakote ER 500 mg every night for mood stabilizer, seizure prevention  Clonazepam 0.5 mg as needed for insomnia, tramadol as needed for pain  Cervical dystonia:  Limited  range of motion of his neck, status post cervical posterior decompression fusion in 2013,  He has limited range of motion of neck, mild left turn, mild retrocollis,     EMG guided Xeomin injection for cervical dystonia, we used Xeomin 150 units, will decrease to 100 units at next injection   Left splenius capitis 25 units Left splenius cervix 25 units Left longissimus capitis 25 units  Right splenius capitis 25 units Right splenius cervix 25 units Right longissimus capitis 25 units     Marcial Pacas, M.D. Ph.D.  [facility] [place], [place] [address] Phone: [phone] Fax:      [phone]

## 2020-05-09 NOTE — Telephone Encounter (Signed)
Decrease xeomin from 150 to 100 units at next injection in Oct 2021

## 2020-05-09 NOTE — Telephone Encounter (Signed)
noted 

## 2020-05-10 NOTE — Telephone Encounter (Signed)
Noted  

## 2020-05-26 ENCOUNTER — Other Ambulatory Visit: Payer: Self-pay | Admitting: Neurology

## 2020-05-28 ENCOUNTER — Telehealth: Payer: Self-pay | Admitting: Neurology

## 2020-05-28 NOTE — Telephone Encounter (Signed)
Pt request refill divalproex (DEPAKOTE ER) 500 MG 24 hr tablet at [facility] #[id number]

## 2020-05-28 NOTE — Telephone Encounter (Signed)
Refill sent to requested pharmacy today.

## 2020-06-18 DIAGNOSIS — R339 Retention of urine, unspecified: Secondary | ICD-10-CM | POA: Diagnosis not present

## 2020-08-25 ENCOUNTER — Other Ambulatory Visit: Payer: Self-pay | Admitting: Neurology

## 2020-08-29 ENCOUNTER — Encounter: Payer: Self-pay | Admitting: Neurology

## 2020-08-29 ENCOUNTER — Ambulatory Visit (INDEPENDENT_AMBULATORY_CARE_PROVIDER_SITE_OTHER): Payer: Medicare Other | Admitting: Neurology

## 2020-08-29 VITALS — BP 147/96 | HR 94 | Ht 66.0 in | Wt 137.5 lb

## 2020-08-29 DIAGNOSIS — G243 Spasmodic torticollis: Secondary | ICD-10-CM | POA: Diagnosis not present

## 2020-08-29 MED ORDER — INCOBOTULINUMTOXINA 100 UNITS IM SOLR
100.0000 [IU] | INTRAMUSCULAR | Status: DC
  Administered 2020-08-29 – 2020-08-30 (×2): 100 [IU] via INTRAMUSCULAR

## 2020-08-29 NOTE — Progress Notes (Signed)
[facility]  PATIENT: Patrick Daniel DOB: [birth year]  HISTORY OF PRESENT ILLNESS: He has past medical history of hyperlipidemia, depression, He presented with gradual onset language difficulty, gait difficulty since 2009. He was evaluated by [facility] neurologist at [facility], reported normal MRI of brain, he was diagnosed with cervical dystonia, was given Botox injection of the cervical muscles, which has improved his symptoms temporarily.  He moved back to the [name] in April 2012, began to seek medical attention, initially evaluated by [name], reported again normal MRI of brain, also had trial of Haldol and baclofen without much improvement.  Sinemet did not help him. Artane and baclofen provide no help either. 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 He was evaluated by local neurosurgeon [name] at [facility], Because concomitant central nervous system degenerative disorder, cervical dystonia, he is hesitate to perform decompression surgery. I later referred him to [facility] neurology, evaluated by [name], who has suggested Botox injection to alleviate his cervical dystonia symptoms first, may consider cervical decompression surgery later. Also, likely there is a component of central nervous system degenerative disorder, such as spinocerebellar ataxia,.  He has significant dysarthria, also dysphagia, gait difficulty, frequent falling, he denies sensory changes. Normal or negative CMP, PEP, CBC, HIV, Lyme disease, SSA/B, TSH, ACE, RPR, CRP, CPK.  He received first EMG guided Botox injection since March 2012, he reported great improvement, he has been receiving BOTOX injection every 3 months, there is significant  improvement of his abnormal neck postural, but still with severe retrocollis, left turn, right tilt. he began to have moderate improvement after repeat injection. He was evaluated by his neurosurgeon [name] at [facility], repeat MRI cervical showed advanced multilevel degenerative changes of the cervical spine, most pronounced at C5-C6, where there is severe central stenosis, impression on the cord with focal myelomalacia and severe bilateral neuroforaminal narrowing. Overall appearance is similar to prior study from 09/18/2010. He had posterior decompression and fusion surgery in December 2013, recovering well, but complains of neck muscle spasm, and chest muscle spasm continued spastic gait difficulty, occasional urinary incontinence, his posture is much improved, but has limited range of motion of his neck  He had EMG guided BOTOX injection by me since March 2012, most recent EMG guided Botox injection was by [name] April 10 2015,300 units of Botox A  He continued to have unsteady gait, difficulty finding a comfortable position, urinary urgency, he denies constipation, denies upper extremity paresthesia or weakness, slow worsening dysarthria,   MRI of the brain in 2016 showed generalized atrophy, most prominent in vermis,  genetic or degenerative processes that may present with both ataxia and dystonia such as the spinal cerebellar ataxias and OPCA.   Additional note is made of a few small T2/flair hyperintense foci consistent with minimal small vessel ischemic changes. MRI of cervical spine: Prior C4-C6 ACDF and C2-T2 posterior fusion. The vertebral bodies are normally aligned.   Metal artifact from these fusions obscures many of the images. Multilevel cervical degenerative disc disease, most significant findings are at C5-C6. There appears to be a posterior bony ridge that mildly narrows the neural canal but does not appear to be causing spinal cord compression. However, there is possible  foraminal narrowing to the right that could lead to some right  C6 nerve root impingement.    MRI lumbar in December 2017 no significant abnormality   he reported long history of heavy alcohol abuse for more than 20 years,  He has continued EMG guided botulinum toxin injection for his cervical dystonia over the past few years, last injection was in February 2020,  He was admitted to the hospital on February 11, 2019 after he fell in his apartment lying on the floor for unknown period of time, upon admission, elevated CPK 1988.  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, he was seen by ID consult, acyclovir treatment for 21 days, he also suffered acute kidney failure, that recovered to normal with creatinine of 0.7 upon discharge, he also had a urinary retention, still using Foley, Flomax, was seen by urologist, doubt he is a candidate for suprapubic catheter, he has worsening gait abnormality, can no longer ambulate without assistant, no bowel incontinence, he also suffered left humeral neck fracture, still has limited range of motion of left arm  He was found to be anemia, hemoglobin dropped to 6, required blood transfusion, suspicious for GI bleeding, further work-up is pending, upon discharge, hemoglobin was 10 on Mar 02, 2019,   He is now home with his daughter staying wheelchair, complains of left shoulder pain  UPDATE Aug 08 2019: He continues to make recovery, is able to ambulate with walker, continue have Foley catheter, has significant neck pain, difficulty sleeping  Update October 26, 2019: He responded well to previous injection  UPDATE April 7th 2021: He lives with his daughter, overall doing well, slow worsening gait abnormality, no longer have significant neck pain, but complains of neck muscle stiffness.  UPDATE May 09 2020: He  continue complains of worsening gait abnormality, spasticity, limited range of motion of neck, neck stiffness, but he no longer have significant abnormal neck pulling, abnormal neck posturing  UPDATE Aug 29 2020: He did well with previous injection, continue complains of right sternocleidomastoid tension, tendency to turning towards the left side, sometimes bilateral posterior cervical muscle spasm tension  REVIEW OF SYSTEMS:  Full 14 system review of systems performed and notable only for those listed, all others are neg:  As above.    ALLERGIES: Allergies  Allergen Reactions  . Baclofen Other (See Comments)    Weakness, drowsiness    HOME MEDICATIONS: Outpatient Medications Prior to Visit  Medication Sig Dispense Refill  . clonazePAM (KLONOPIN) 0.5 MG tablet TAKE 1 TABLET BY MOUTH TWICE DAILY AS NEEDED FOR ANXIETY 30 tablet 5  . divalproex (DEPAKOTE ER) 500 MG 24 hr tablet TAKE 1 TABLET(500 MG) BY MOUTH AT BEDTIME 30 tablet 11  . DULoxetine (CYMBALTA) 60 MG capsule Take 1 capsule (60 mg total) by mouth daily. 30 capsule 11  . IncobotulinumtoxinA (XEOMIN IM) Inject 100 Units into the muscle every 3 (three) months.     . loratadine (CLARITIN) 10 MG tablet Take 5 mg by mouth daily.    . traMADol (ULTRAM) 50 MG tablet Take 1 tablet (50 mg total) by mouth every 12 (twelve) hours as needed. 60 tablet 0  . incobotulinumtoxinA (XEOMIN) 100 units injection 100 Units     . incobotulinumtoxinA (XEOMIN) 50 units injection 50 Units      No facility-administered medications prior to visit.    PAST MEDICAL HISTORY: Past Medical History:  Diagnosis Date  . Dysarthria   . Dystonia   .  Gait disturbance   . Myelopathy ([place])   . Paraplegia ([place])     PAST SURGICAL HISTORY: Past Surgical History:  Procedure Laterality Date  . ANTERIOR CERVICAL DECOMP/DISCECTOMY FUSION N/A 10/04/2013   s/p C4-C5, C5-C6 anterior cervical diskectomy and fusion  . POSTERIOR CERVICAL FUSION/FORAMINOTOMY N/A  10/04/2013    posterior cervical fusion from C2-T2 with rods and screws and local bone graft and also decompressive laminectomy from the bottom of C2 to the top of T1 with decompression of spinal cord and the nerve roots bilaterally performed by [name] on 10/04/13     FAMILY HISTORY: Family History  Problem Relation Age of Onset  . Heart disease Mother   . Cancer Mother   . Heart Problems Father   . High Cholesterol Sister   . High blood pressure Sister   . Diabetes Sister   . High blood pressure Sister     SOCIAL HISTORY: Social History   Socioeconomic History  . Marital status: Single    Spouse name: Not on file  . Number of children: 1  . Years of education: Not on file  . Highest education level: Not on file  Occupational History  . Occupation: retired    Comment: Chief Technology Officer  . Smoking status: Former Smoker    Types: Cigarettes    Quit date: 02/01/2013    Years since quitting: 7.5  . Smokeless tobacco: Never Used  . Tobacco comment: quit   Substance and Sexual Activity  . Alcohol use: No  . Drug use: No  . Sexual activity: Not on file  Other Topics Concern  . Not on file  Social History Narrative   Patient lives in independent living [place]. Patient is retired from [employer]. Patient has one child. Right handed. Patient is divorced.   Caffeine- 12 - Coffee daily - one daily -soda   Social Determinants of Health   Financial Resource Strain:   . Difficulty of Paying Living Expenses: Not on file  Food Insecurity:   . Worried About Charity fundraiser in the Last Year: Not on file  . Ran Out of Food in the Last Year: Not on file  Transportation Needs:   . Lack of Transportation (Medical): Not on file  . Lack of Transportation (Non-Medical): Not on file  Physical Activity:   . Days of Exercise per Week: Not on file  . Minutes of Exercise per Session: Not on file  Stress:   . Feeling of Stress : Not on file  Social Connections:   . Frequency of  Communication with Friends and Family: Not on file  . Frequency of Social Gatherings with Friends and Family: Not on file  . Attends Religious Services: Not on file  . Active Member of Clubs or Organizations: Not on file  . Attends Archivist Meetings: Not on file  . Marital Status: Not on file  Intimate Partner Violence:   . Fear of Current or Ex-Partner: Not on file  . Emotionally Abused: Not on file  . Physically Abused: Not on file  . Sexually Abused: Not on file     PHYSICAL EXAM  Mentation: he has moderate to severe spastic dysarthria.  Mild left shoulder elevation, mild right turn, slight retrocollis  DIAGNOSTIC DATA (LABS, IMAGING, TESTING) -  ASSESSMENT AND PLAN 64 y.o. 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  High risk for seizure   EEG  Continue Depakote ER 500 mg every night for mood stabilizer, seizure prevention  Clonazepam 0.5 mg as needed for insomnia, tramadol as needed for pain  Cervical dystonia:  Limited range of motion of his neck, status post cervical posterior decompression fusion in 2013,  He has limited range of motion of neck, mild left turn, mild retrocollis, mild right shoulder elevation   EMG guided Xeomin injection for cervical dystonia, we used Xeomin 100 units,    Right sternocleidomastoid 37.5 units Right levator scapula 25 units Left splenius capitis 12.5 units Left splenius cervix 25 units    Marcial Pacas, M.D. Ph.D.  [facility] [place], [place] [address] Phone: [phone] Fax:      [phone]

## 2020-08-29 NOTE — Progress Notes (Signed)
**  Xeomin 100 units x 1 vial, [place] 5396-7289-79, Lot [id number], Exp 01/2022, office supply.//mck,rn**

## 2020-12-05 ENCOUNTER — Ambulatory Visit (INDEPENDENT_AMBULATORY_CARE_PROVIDER_SITE_OTHER): Payer: Medicare Other | Admitting: Neurology

## 2020-12-05 ENCOUNTER — Encounter: Payer: Self-pay | Admitting: Neurology

## 2020-12-05 VITALS — BP 137/91 | HR 78 | Ht 66.0 in | Wt 137.5 lb

## 2020-12-05 DIAGNOSIS — G319 Degenerative disease of nervous system, unspecified: Secondary | ICD-10-CM | POA: Diagnosis not present

## 2020-12-05 DIAGNOSIS — R471 Dysarthria and anarthria: Secondary | ICD-10-CM

## 2020-12-05 DIAGNOSIS — R269 Unspecified abnormalities of gait and mobility: Secondary | ICD-10-CM | POA: Diagnosis not present

## 2020-12-05 DIAGNOSIS — G243 Spasmodic torticollis: Secondary | ICD-10-CM | POA: Diagnosis not present

## 2020-12-05 MED ORDER — INCOBOTULINUMTOXINA 100 UNITS IM SOLR
100.0000 [IU] | INTRAMUSCULAR | Status: DC
  Administered 2020-12-05: 100 [IU] via INTRAMUSCULAR

## 2020-12-05 NOTE — Progress Notes (Signed)
**  Xeomin 100 units x 1 vial, [place] 0990-6893-40, Lot [id number], Exp 07/2022, office supply.//mck,rn**

## 2020-12-05 NOTE — Progress Notes (Signed)
[facility]  PATIENT: Patrick Daniel DOB: [birth year]  HISTORY OF PRESENT ILLNESS: He has past medical history of hyperlipidemia, depression, He presented with gradual onset language difficulty, gait difficulty since 2009. He was evaluated by [facility] neurologist at [facility], reported normal MRI of brain, he was diagnosed with cervical dystonia, was given Botox injection of the cervical muscles, which has improved his symptoms temporarily.  He moved back to the [name] in April 2012, began to seek medical attention, initially evaluated by [name], reported again normal MRI of brain, also had trial of Haldol and baclofen without much improvement.  Sinemet did not help him. Artane and baclofen provide no help either. 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 He was evaluated by local neurosurgeon [name] at [place], Because concomitant central nervous system degenerative disorder, cervical dystonia, he is hesitate to perform decompression surgery. I later referred him to [facility] neurology, evaluated by [name], who has suggested Botox injection to alleviate his cervical dystonia symptoms first, may consider cervical decompression surgery later. Also, likely there is a component of central nervous system degenerative disorder, such as spinocerebellar ataxia,.  He has significant dysarthria, also dysphagia, gait difficulty, frequent falling, he denies sensory changes. Normal or negative CMP, PEP, CBC, HIV, Lyme disease, SSA/B, TSH, ACE, RPR, CRP, CPK.  He received first EMG guided Botox injection since March 2012, he reported great improvement, he has been receiving BOTOX injection every 3 months, there is significant  improvement of his abnormal neck postural, but still with severe retrocollis, left turn, right tilt. he began to have moderate improvement after repeat injection. He was evaluated by his neurosurgeon [name] at [facility], repeat MRI cervical showed advanced multilevel degenerative changes of the cervical spine, most pronounced at C5-C6, where there is severe central stenosis, impression on the cord with focal myelomalacia and severe bilateral neuroforaminal narrowing. Overall appearance is similar to prior study from 09/18/2010. He had posterior decompression and fusion surgery in December 2013, recovering well, but complains of neck muscle spasm, and chest muscle spasm continued spastic gait difficulty, occasional urinary incontinence, his posture is much improved, but has limited range of motion of his neck  He had EMG guided BOTOX injection by me since March 2012, most recent EMG guided Botox injection was by [name] April 10 2015,300 units of Botox A  He continued to have unsteady gait, difficulty finding a comfortable position, urinary urgency, he denies constipation, denies upper extremity paresthesia or weakness, slow worsening dysarthria,   MRI of the brain in 2016 showed generalized atrophy, most prominent in vermis,  genetic or degenerative processes that may present with both ataxia and dystonia such as the spinal cerebellar ataxias and OPCA.   Additional note is made of a few small T2/flair hyperintense foci consistent with minimal small vessel ischemic changes. MRI of cervical spine: Prior C4-C6 ACDF and C2-T2 posterior fusion. The vertebral bodies are normally aligned.   Metal artifact from these fusions obscures many of the images. Multilevel cervical degenerative disc disease, most significant findings are at C5-C6. There appears to be a posterior bony ridge that mildly narrows the neural canal but does not appear to be causing spinal cord compression. However, there is possible  foraminal narrowing to the right that could lead to some right  C6 nerve root impingement.    MRI lumbar in December 2017 no significant abnormality   he reported long history of heavy alcohol abuse for more than 20 years,  He has continued EMG guided botulinum toxin injection for his cervical dystonia over the past few years, last injection was in February 2020,  He was admitted to the hospital on February 11, 2019 after he fell in his apartment lying on the floor for unknown period of time, upon admission, elevated CPK 1988.  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, he was seen by ID consult, acyclovir treatment for 21 days, he also suffered acute kidney failure, that recovered to normal with creatinine of 0.7 upon discharge, he also had a urinary retention, still using Foley, Flomax, was seen by urologist, doubt he is a candidate for suprapubic catheter, he has worsening gait abnormality, can no longer ambulate without assistant, no bowel incontinence, he also suffered left humeral neck fracture, still has limited range of motion of left arm  He was found to be anemia, hemoglobin dropped to 6, required blood transfusion, suspicious for GI bleeding, further work-up is pending, upon discharge, hemoglobin was 10 on Mar 02, 2019,   He is now home with his daughter staying wheelchair, complains of left shoulder pain  UPDATE Aug 08 2019: He continues to make recovery, is able to ambulate with walker, continue have Foley catheter, has significant neck pain, difficulty sleeping  Update October 26, 2019: He responded well to previous injection  UPDATE April 7th 2021: He lives with his daughter, overall doing well, slow worsening gait abnormality, no longer have significant neck pain, but complains of neck muscle stiffness.  UPDATE May 09 2020: He  continue complains of worsening gait abnormality, spasticity, limited range of motion of neck, neck stiffness, but he no longer have significant abnormal neck pulling, abnormal neck posturing  UPDATE Aug 29 2020: He did well with previous injection, continue complains of right sternocleidomastoid tension, tendency to turning towards the left side, sometimes bilateral posterior cervical muscle spasm tension  UPDATE Dec 05 2020: He has mildly regression, increased weakness following his COVID infection few weeks ago.  He complains of muscle tension but no significant pain, bilateral lower extremity spasticity  REVIEW OF SYSTEMS:  Full 14 system review of systems performed and notable only for those listed, all others are neg:  As above.    ALLERGIES: Allergies  Allergen Reactions  . Baclofen Other (See Comments)    Weakness, drowsiness    HOME MEDICATIONS: Outpatient Medications Prior to Visit  Medication Sig Dispense Refill  . clonazePAM (KLONOPIN) 0.5 MG tablet TAKE 1 TABLET BY MOUTH TWICE DAILY AS NEEDED FOR ANXIETY 30 tablet 5  . divalproex (DEPAKOTE ER) 500 MG 24 hr tablet TAKE 1 TABLET(500 MG) BY MOUTH AT BEDTIME 30 tablet 11  . DULoxetine (CYMBALTA) 60 MG capsule Take 1 capsule (60 mg total) by mouth daily. 30 capsule 11  . IncobotulinumtoxinA (XEOMIN IM) Inject 100 Units into the muscle every 3 (three) months.     . loratadine (CLARITIN) 10 MG tablet Take 5 mg by mouth daily.    . traMADol (ULTRAM) 50 MG tablet Take 1 tablet (50 mg total) by mouth every 12 (twelve) hours as needed. 60 tablet 0  . incobotulinumtoxinA (XEOMIN) 100 units injection 100 Units      No facility-administered medications prior to visit.  PAST MEDICAL HISTORY: Past Medical History:  Diagnosis Date  . Dysarthria   . Dystonia   . Gait disturbance   . Myelopathy ([place])   . Paraplegia ([place])     PAST SURGICAL HISTORY: Past Surgical History:  Procedure Laterality Date  . ANTERIOR CERVICAL  DECOMP/DISCECTOMY FUSION N/A 10/04/2013   s/p C4-C5, C5-C6 anterior cervical diskectomy and fusion  . POSTERIOR CERVICAL FUSION/FORAMINOTOMY N/A 10/04/2013    posterior cervical fusion from C2-T2 with rods and screws and local bone graft and also decompressive laminectomy from the bottom of C2 to the top of T1 with decompression of spinal cord and the nerve roots bilaterally performed by [name] on 10/04/13     FAMILY HISTORY: Family History  Problem Relation Age of Onset  . Heart disease Mother   . Cancer Mother   . Heart Problems Father   . High Cholesterol Sister   . High blood pressure Sister   . Diabetes Sister   . High blood pressure Sister     SOCIAL HISTORY: Social History   Socioeconomic History  . Marital status: Single    Spouse name: Not on file  . Number of children: 1  . Years of education: Not on file  . Highest education level: Not on file  Occupational History  . Occupation: retired    Comment: Chief Technology Officer  . Smoking status: Former Smoker    Types: Cigarettes    Quit date: 02/01/2013    Years since quitting: 7.8  . Smokeless tobacco: Never Used  . Tobacco comment: quit   Substance and Sexual Activity  . Alcohol use: No  . Drug use: No  . Sexual activity: Not on file  Other Topics Concern  . Not on file  Social History Narrative   Patient lives in independent living [place]. Patient is retired from [employer]. Patient has one child. Right handed. Patient is divorced.   Caffeine- 12 - Coffee daily - one daily -soda   Social Determinants of Health   Financial Resource Strain: Not on file  Food Insecurity: Not on file  Transportation Needs: Not on file  Physical Activity: Not on file  Stress: Not on file  Social Connections: Not on file  Intimate Partner Violence: Not on file     PHYSICAL EXAM  PHYSICAL EXAMNIATION:  Gen: NAD, conversant, well nourised, well groomed                     Cardiovascular: Regular rate rhythm, no  peripheral edema, warm, nontender. Eyes: Conjunctivae clear without exudates or hemorrhage Neck: Supple, no carotid bruits. Pulmonary: Clear to auscultation bilaterally   NEUROLOGICAL EXAM:  MENTAL STATUS: Speech/Cognition: Awake, alert, normal speech, oriented to history taking and casual conversation.  CRANIAL NERVES: CN II: Visual fields are full to confrontation.  Pupils are round equal and briskly reactive to light. CN III, IV, VI: extraocular movement are normal. No ptosis. CN V: Facial sensation is intact to light touch. CN VII: Face is symmetric with normal eye closure and smile. CN VIII: Hearing is normal to casual conversation CN IX, X: Palate elevates symmetrically. Phonation is normal. CN XI: Head turning and shoulder shrug are intact CN XII: Tongue is midline with normal movements and no atrophy.  MOTOR: Upper extremity muscle strengths and muscle tone was normal, moderate spasticity of bilateral lower extremity, moderate bilateral hip flexion ankle dorsiflexion knee flexion weakness,  REFLEXES: Reflexes are 2  and symmetric at the biceps, triceps, 3/3  knees and ankles. Plantar responses are flexor.  SENSORY: Intact to light touch, pinprick, positional and vibratory sensation at fingers and toes.  COORDINATION: There is no trunk or limb ataxia.    GAIT/STANCE: He needs push-up to get up from seated position, tendency to fall backwards, rely on his walker, stiff, unsteady   DIAGNOSTIC DATA (LABS, IMAGING, TESTING) -  ASSESSMENT AND PLAN 65 y.o. 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  High risk for seizure   EEG  Continue Depakote ER 500 mg every night for mood stabilizer, seizure prevention  Clonazepam 0.5 mg as needed for insomnia, tramadol as needed for pain  Cervical dystonia:  Limited range of motion of his neck, status post cervical posterior decompression fusion in 2013,  He has limited range of motion of neck, mild left turn, mild retrocollis, mild right shoulder elevation   EMG guided Xeomin injection for cervical dystonia, we used Xeomin 100 units,    Left levator scapula 25 units Right levator scapula 25 units Left splenius capitis 12.5 units Left splenius cervix 25 units Right splenius cervix 12.5 units.  Gait abnormality, need physical therapy    Marcial Pacas, M.D. Ph.D.  [facility] [place],  [address] Phone: [phone] Fax:      [phone]

## 2020-12-10 DIAGNOSIS — Z131 Encounter for screening for diabetes mellitus: Secondary | ICD-10-CM | POA: Diagnosis not present

## 2020-12-10 DIAGNOSIS — Z1339 Encounter for screening examination for other mental health and behavioral disorders: Secondary | ICD-10-CM | POA: Diagnosis not present

## 2020-12-10 DIAGNOSIS — Z139 Encounter for screening, unspecified: Secondary | ICD-10-CM | POA: Diagnosis not present

## 2020-12-10 DIAGNOSIS — Z1331 Encounter for screening for depression: Secondary | ICD-10-CM | POA: Diagnosis not present

## 2020-12-10 DIAGNOSIS — Z13220 Encounter for screening for lipoid disorders: Secondary | ICD-10-CM | POA: Diagnosis not present

## 2020-12-10 DIAGNOSIS — Z136 Encounter for screening for cardiovascular disorders: Secondary | ICD-10-CM | POA: Diagnosis not present

## 2020-12-10 DIAGNOSIS — Z125 Encounter for screening for malignant neoplasm of prostate: Secondary | ICD-10-CM | POA: Diagnosis not present

## 2020-12-10 DIAGNOSIS — R21 Rash and other nonspecific skin eruption: Secondary | ICD-10-CM | POA: Diagnosis not present

## 2020-12-10 DIAGNOSIS — Z0000 Encounter for general adult medical examination without abnormal findings: Secondary | ICD-10-CM | POA: Diagnosis not present

## 2020-12-11 ENCOUNTER — Telehealth: Payer: Self-pay | Admitting: Neurology

## 2020-12-11 DIAGNOSIS — Z131 Encounter for screening for diabetes mellitus: Secondary | ICD-10-CM | POA: Diagnosis not present

## 2020-12-11 DIAGNOSIS — Z125 Encounter for screening for malignant neoplasm of prostate: Secondary | ICD-10-CM | POA: Diagnosis not present

## 2020-12-11 DIAGNOSIS — Z13220 Encounter for screening for lipoid disorders: Secondary | ICD-10-CM | POA: Diagnosis not present

## 2020-12-11 NOTE — Telephone Encounter (Signed)
[facility] called to let us know that they have attempted multiple times today and yesterday to contact pt to get set up and they are unable to get in touch. Just wanted [place] to make note of it.

## 2020-12-11 NOTE — Telephone Encounter (Signed)
Noted  Thanks Tori ,   I have called patient , and daughter left a message asking them to call [facility] to schedule . I will also send Georgina Snell a message and update him . Thanks Hinton Dyer

## 2020-12-13 DIAGNOSIS — G8220 Paraplegia, unspecified: Secondary | ICD-10-CM | POA: Diagnosis not present

## 2020-12-13 DIAGNOSIS — M5000 Cervical disc disorder with myelopathy, unspecified cervical region: Secondary | ICD-10-CM | POA: Diagnosis not present

## 2020-12-13 DIAGNOSIS — G319 Degenerative disease of nervous system, unspecified: Secondary | ICD-10-CM | POA: Diagnosis not present

## 2020-12-13 DIAGNOSIS — Z9181 History of falling: Secondary | ICD-10-CM | POA: Diagnosis not present

## 2020-12-13 DIAGNOSIS — Z87891 Personal history of nicotine dependence: Secondary | ICD-10-CM | POA: Diagnosis not present

## 2020-12-13 DIAGNOSIS — G243 Spasmodic torticollis: Secondary | ICD-10-CM | POA: Diagnosis not present

## 2020-12-13 DIAGNOSIS — G1119 Other early-onset cerebellar ataxia: Secondary | ICD-10-CM | POA: Diagnosis not present

## 2020-12-13 DIAGNOSIS — M4802 Spinal stenosis, cervical region: Secondary | ICD-10-CM | POA: Diagnosis not present

## 2020-12-13 DIAGNOSIS — M1990 Unspecified osteoarthritis, unspecified site: Secondary | ICD-10-CM | POA: Diagnosis not present

## 2020-12-13 DIAGNOSIS — F32A Depression, unspecified: Secondary | ICD-10-CM | POA: Diagnosis not present

## 2020-12-13 DIAGNOSIS — M4712 Other spondylosis with myelopathy, cervical region: Secondary | ICD-10-CM | POA: Diagnosis not present

## 2020-12-13 DIAGNOSIS — N179 Acute kidney failure, unspecified: Secondary | ICD-10-CM | POA: Diagnosis not present

## 2020-12-13 DIAGNOSIS — E785 Hyperlipidemia, unspecified: Secondary | ICD-10-CM | POA: Diagnosis not present

## 2020-12-13 DIAGNOSIS — Z981 Arthrodesis status: Secondary | ICD-10-CM | POA: Diagnosis not present

## 2020-12-13 NOTE — Telephone Encounter (Signed)
Coralyn Mark from [facility] called to ask for an extension of orders for referral for PT for pt. He states frequency is 1 week 1, 2 week 2 & 1 week 6. May leave vm if unavailable.  Best contact: [phone]

## 2020-12-17 NOTE — Telephone Encounter (Signed)
I called Patrick Daniel back and provided verbal order for physical therapy.  Patrick Daniel verbalized understanding and had no further questions at this time.

## 2020-12-27 DIAGNOSIS — Z23 Encounter for immunization: Secondary | ICD-10-CM | POA: Diagnosis not present

## 2020-12-27 DIAGNOSIS — I700 Atherosclerosis of aorta: Secondary | ICD-10-CM | POA: Diagnosis not present

## 2020-12-27 DIAGNOSIS — E785 Hyperlipidemia, unspecified: Secondary | ICD-10-CM | POA: Diagnosis not present

## 2020-12-27 DIAGNOSIS — G114 Hereditary spastic paraplegia: Secondary | ICD-10-CM | POA: Diagnosis not present

## 2021-01-12 DIAGNOSIS — G243 Spasmodic torticollis: Secondary | ICD-10-CM | POA: Diagnosis not present

## 2021-01-12 DIAGNOSIS — E785 Hyperlipidemia, unspecified: Secondary | ICD-10-CM | POA: Diagnosis not present

## 2021-01-12 DIAGNOSIS — Z9181 History of falling: Secondary | ICD-10-CM | POA: Diagnosis not present

## 2021-01-12 DIAGNOSIS — M4802 Spinal stenosis, cervical region: Secondary | ICD-10-CM | POA: Diagnosis not present

## 2021-01-12 DIAGNOSIS — G1119 Other early-onset cerebellar ataxia: Secondary | ICD-10-CM | POA: Diagnosis not present

## 2021-01-12 DIAGNOSIS — N179 Acute kidney failure, unspecified: Secondary | ICD-10-CM | POA: Diagnosis not present

## 2021-01-12 DIAGNOSIS — G319 Degenerative disease of nervous system, unspecified: Secondary | ICD-10-CM | POA: Diagnosis not present

## 2021-01-12 DIAGNOSIS — G8220 Paraplegia, unspecified: Secondary | ICD-10-CM | POA: Diagnosis not present

## 2021-01-12 DIAGNOSIS — M5000 Cervical disc disorder with myelopathy, unspecified cervical region: Secondary | ICD-10-CM | POA: Diagnosis not present

## 2021-01-12 DIAGNOSIS — M1990 Unspecified osteoarthritis, unspecified site: Secondary | ICD-10-CM | POA: Diagnosis not present

## 2021-01-12 DIAGNOSIS — F32A Depression, unspecified: Secondary | ICD-10-CM | POA: Diagnosis not present

## 2021-01-12 DIAGNOSIS — M4712 Other spondylosis with myelopathy, cervical region: Secondary | ICD-10-CM | POA: Diagnosis not present

## 2021-01-12 DIAGNOSIS — Z981 Arthrodesis status: Secondary | ICD-10-CM | POA: Diagnosis not present

## 2021-01-12 DIAGNOSIS — Z87891 Personal history of nicotine dependence: Secondary | ICD-10-CM | POA: Diagnosis not present

## 2021-01-18 ENCOUNTER — Other Ambulatory Visit: Payer: Self-pay | Admitting: Neurology

## 2021-02-23 ENCOUNTER — Other Ambulatory Visit: Payer: Self-pay | Admitting: Neurology

## 2021-03-13 ENCOUNTER — Encounter: Payer: Self-pay | Admitting: Neurology

## 2021-03-13 ENCOUNTER — Ambulatory Visit (INDEPENDENT_AMBULATORY_CARE_PROVIDER_SITE_OTHER): Payer: Medicare Other | Admitting: Neurology

## 2021-03-13 ENCOUNTER — Other Ambulatory Visit: Payer: Self-pay

## 2021-03-13 VITALS — BP 138/82 | HR 98 | Ht 66.0 in

## 2021-03-13 DIAGNOSIS — G243 Spasmodic torticollis: Secondary | ICD-10-CM | POA: Diagnosis not present

## 2021-03-13 DIAGNOSIS — R269 Unspecified abnormalities of gait and mobility: Secondary | ICD-10-CM

## 2021-03-13 MED ORDER — DIVALPROEX SODIUM ER 500 MG PO TB24: 500.0000 mg | ORAL_TABLET | Freq: Every day | ORAL | 4 refills | Status: DC

## 2021-03-13 MED ORDER — INCOBOTULINUMTOXINA 100 UNITS IM SOLR
100.0000 [IU] | INTRAMUSCULAR | Status: DC
  Administered 2021-03-13: 100 [IU] via INTRAMUSCULAR

## 2021-03-13 MED ORDER — DULOXETINE HCL 60 MG PO CPEP: 60.0000 mg | ORAL_CAPSULE | Freq: Every day | ORAL | 4 refills | Status: DC

## 2021-03-13 MED ORDER — CLONAZEPAM 0.5 MG PO TABS: 0.5000 mg | ORAL_TABLET | Freq: Every evening | ORAL | 5 refills | Status: DC | PRN

## 2021-03-13 MED ORDER — INCOBOTULINUMTOXINA 100 UNITS IM SOLR: 100.0000 [IU] | INTRAMUSCULAR | Status: DC

## 2021-03-13 MED ORDER — TRAMADOL HCL 50 MG PO TABS: 50.0000 mg | ORAL_TABLET | Freq: Two times a day (BID) | ORAL | 5 refills | Status: DC | PRN

## 2021-03-13 NOTE — Progress Notes (Signed)
[facility]  PATIENT: Patrick Daniel DOB: [birth year]  HISTORY OF PRESENT ILLNESS: He has past medical history of hyperlipidemia, depression, He presented with gradual onset language difficulty, gait difficulty since 2009. He was evaluated by [facility] neurologist at [facility], reported normal MRI of brain, he was diagnosed with cervical dystonia, was given Botox injection of the cervical muscles, which has improved his symptoms temporarily.  He moved back to the [name] in April 2012, began to seek medical attention, initially evaluated by [name], reported again normal MRI of brain, also had trial of Haldol and baclofen without much improvement.  Sinemet did not help him. Artane and baclofen provide no help either. 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 He was evaluated by local neurosurgeon [name] at [facility], Because concomitant central nervous system degenerative disorder, cervical dystonia, he is hesitate to perform decompression surgery. I later referred him to [facility] neurology, evaluated by [name], who has suggested Botox injection to alleviate his cervical dystonia symptoms first, may consider cervical decompression surgery later. Also, likely there is a component of central nervous system degenerative disorder, such as spinocerebellar ataxia,.  He has significant dysarthria, also dysphagia, gait difficulty, frequent falling, he denies sensory changes. Normal or negative CMP, PEP, CBC, HIV, Lyme disease, SSA/B, TSH, ACE, RPR, CRP, CPK.  He received first EMG guided Botox injection since March 2012, he reported great improvement, he has been receiving BOTOX injection every 3 months, there is significant  improvement of his abnormal neck postural, but still with severe retrocollis, left turn, right tilt. he began to have moderate improvement after repeat injection. He was evaluated by his neurosurgeon [name] at [facility], repeat MRI cervical showed advanced multilevel degenerative changes of the cervical spine, most pronounced at C5-C6, where there is severe central stenosis, impression on the cord with focal myelomalacia and severe bilateral neuroforaminal narrowing. Overall appearance is similar to prior study from 09/18/2010. He had posterior decompression and fusion surgery in December 2013, recovering well, but complains of neck muscle spasm, and chest muscle spasm continued spastic gait difficulty, occasional urinary incontinence, his posture is much improved, but has limited range of motion of his neck  He had EMG guided BOTOX injection by me since March 2012, most recent EMG guided Botox injection was by [name] April 10 2015,300 units of Botox A  He continued to have unsteady gait, difficulty finding a comfortable position, urinary urgency, he denies constipation, denies upper extremity paresthesia or weakness, slow worsening dysarthria,   MRI of the brain in 2016 showed generalized atrophy, most prominent in vermis,  genetic or degenerative processes that may present with both ataxia and dystonia such as the spinal cerebellar ataxias and OPCA.   Additional note is made of a few small T2/flair hyperintense foci consistent with minimal small vessel ischemic changes. MRI of cervical spine: Prior C4-C6 ACDF and C2-T2 posterior fusion. The vertebral bodies are normally aligned.   Metal artifact from these fusions obscures many of the images. Multilevel cervical degenerative disc disease, most significant findings are at C5-C6. There appears to be a posterior bony ridge that mildly narrows the neural canal but does not appear to be causing spinal cord compression. However, there is possible  foraminal narrowing to the right that could lead to some right  C6 nerve root impingement.    MRI lumbar in December 2017 no significant abnormality   he reported long history of heavy alcohol abuse for more than 20 years,  He has continued EMG guided botulinum toxin injection for his cervical dystonia over the past few years, last injection was in February 2020,  He was admitted to the hospital on February 11, 2019 after he fell in his apartment lying on the floor for unknown period of time, upon admission, elevated CPK 1988.  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, he was seen by ID consult, acyclovir treatment for 21 days, he also suffered acute kidney failure, that recovered to normal with creatinine of 0.7 upon discharge, he also had a urinary retention, still using Foley, Flomax, was seen by urologist, doubt he is a candidate for suprapubic catheter, he has worsening gait abnormality, can no longer ambulate without assistant, no bowel incontinence, he also suffered left humeral neck fracture, still has limited range of motion of left arm  He was found to be anemia, hemoglobin dropped to 6, required blood transfusion, suspicious for GI bleeding, further work-up is pending, upon discharge, hemoglobin was 10 on Mar 02, 2019,   He is now home with his daughter staying wheelchair, complains of left shoulder pain  UPDATE Aug 08 2019: He continues to make recovery, is able to ambulate with walker, continue have Foley catheter, has significant neck pain, difficulty sleeping  Update October 26, 2019: He responded well to previous injection  UPDATE April 7th 2021: He lives with his daughter, overall doing well, slow worsening gait abnormality, no longer have significant neck pain, but complains of neck muscle stiffness.  UPDATE May 09 2020: He  continue complains of worsening gait abnormality, spasticity, limited range of motion of neck, neck stiffness, but he no longer have significant abnormal neck pulling, abnormal neck posturing  UPDATE Aug 29 2020: He did well with previous injection, continue complains of right sternocleidomastoid tension, tendency to turning towards the left side, sometimes bilateral posterior cervical muscle spasm tension  UPDATE Dec 05 2020: He has mildly regression, increased weakness following his COVID infection few weeks ago.  He complains of muscle tension but no significant pain, bilateral lower extremity spasticity  Update Mar 13, 2021: He is brought in by his daughter at today's clinical visit, continuing intermittent neck pain, clonazepam is helpful, occasional needed extra tablet   REVIEW OF SYSTEMS:  Full 14 system review of systems performed and notable only for those listed, all others are neg:  As above.    ALLERGIES: Allergies  Allergen Reactions  . Baclofen Other (See Comments)    Weakness, drowsiness    HOME MEDICATIONS: Outpatient Medications Prior to Visit  Medication Sig Dispense Refill  . clonazePAM (KLONOPIN) 0.5 MG tablet TAKE 1 TABLET BY MOUTH TWICE DAILY AS NEEDED FOR ANXIETY 30 tablet 5  . divalproex (DEPAKOTE ER) 500 MG 24 hr tablet TAKE 1 TABLET(500 MG) BY MOUTH AT BEDTIME 30 tablet 11  . DULoxetine (CYMBALTA) 60 MG capsule Take 1 capsule (60 mg total) by mouth daily. 30 capsule 11  . IncobotulinumtoxinA (XEOMIN IM) Inject 100 Units into the muscle every 3 (three) months.     . loratadine (CLARITIN) 10 MG tablet Take 5 mg by mouth daily.    . traMADol (ULTRAM) 50 MG tablet Take 1 tablet (50 mg total) by mouth every 12 (  twelve) hours as needed. 60 tablet 0  . incobotulinumtoxinA (XEOMIN) 100 units injection 100 Units      No facility-administered medications prior to visit.    PAST MEDICAL HISTORY: Past Medical History:  Diagnosis Date  . Dysarthria   . Dystonia    . Gait disturbance   . Myelopathy ([place])   . Paraplegia ([place])     PAST SURGICAL HISTORY: Past Surgical History:  Procedure Laterality Date  . ANTERIOR CERVICAL DECOMP/DISCECTOMY FUSION N/A 10/04/2013   s/p C4-C5, C5-C6 anterior cervical diskectomy and fusion  . POSTERIOR CERVICAL FUSION/FORAMINOTOMY N/A 10/04/2013    posterior cervical fusion from C2-T2 with rods and screws and local bone graft and also decompressive laminectomy from the bottom of C2 to the top of T1 with decompression of spinal cord and the nerve roots bilaterally performed by [name] on 10/04/13     FAMILY HISTORY: Family History  Problem Relation Age of Onset  . Heart disease Mother   . Cancer Mother   . Heart Problems Father   . High Cholesterol Sister   . High blood pressure Sister   . Diabetes Sister   . High blood pressure Sister     SOCIAL HISTORY: Social History   Socioeconomic History  . Marital status: Single    Spouse name: Not on file  . Number of children: 1  . Years of education: Not on file  . Highest education level: Not on file  Occupational History  . Occupation: retired    Comment: Chief Technology Officer  . Smoking status: Former Smoker    Types: Cigarettes    Quit date: 02/01/2013    Years since quitting: 8.1  . Smokeless tobacco: Never Used  . Tobacco comment: quit   Substance and Sexual Activity  . Alcohol use: No  . Drug use: No  . Sexual activity: Not on file  Other Topics Concern  . Not on file  Social History Narrative   Patient lives in independent living [place]. Patient is retired from [employer]. Patient has one child. Right handed. Patient is divorced.   Caffeine- 12 - Coffee daily - one daily -soda   Social Determinants of Health   Financial Resource Strain: Not on file  Food Insecurity: Not on file  Transportation Needs: Not on file  Physical Activity: Not on file  Stress: Not on file  Social Connections: Not on file  Intimate Partner Violence: Not on  file     PHYSICAL EXAM  PHYSICAL EXAMNIATION:  Gen: NAD, conversant, well nourised, well groomed                     Cardiovascular: Regular rate rhythm, no peripheral edema, warm, nontender. Eyes: Conjunctivae clear without exudates or hemorrhage Neck: Supple, no carotid bruits. Pulmonary: Clear to auscultation bilaterally   NEUROLOGICAL EXAM:  MENTAL STATUS: Speech/Cognition: Awake, alert, slurred almost intelligible speech,  CRANIAL NERVES: CN II: Visual fields are full to confrontation.  Pupils are round equal and briskly reactive to light. CN III, IV, VI: extraocular movement are normal. No ptosis. CN V: Facial sensation is intact to light touch. CN VII: Face is symmetric with normal eye closure and smile. CN VIII: Hearing is normal to casual conversation CN IX, X: Palate elevates symmetrically. Phonation is normal. CN XI: Head turning and shoulder shrug are intact    MOTOR: Limited range of motion of his neck, moderate upper and lower extremity spasticity, antigravity movement  REFLEXES: Reflexes are 2  and symmetric at the biceps, triceps, 3/3 knees and ankles. Plantar responses are flexor.  SENSORY: Intact to light touch, pinprick, positional and vibratory sensation at fingers and toes.  COORDINATION: There is no trunk or limb ataxia.    GAIT/STANCE: He needs push-up to get up from seated position, tendency to fall backwards, rely on his walker, stiff, unsteady   DIAGNOSTIC DATA (LABS, IMAGING, TESTING) -  ASSESSMENT AND PLAN 65 y.o. 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  High risk for seizure   Continue Depakote ER 500 mg every night for mood stabilizer, seizure prevention  Clonazepam 0.5 mg as needed for insomnia, tramadol as needed for pain  Cervical dystonia:  Limited range of motion of his neck, status post cervical posterior decompression fusion in 2013,  He has limited range of motion of neck, mild left turn, mild retrocollis, mild right shoulder elevation   EMG guided Xeomin injection for cervical dystonia, we used Xeomin 100 units,    Left levator scapula 25 units Right levator scapula 12.5 units  Left splenius capitis 12.5 units Left splenius cervix 25 units Right splenius cervix 25 units.      Marcial Pacas, M.D. Ph.D.  [facility] [place], [place] [address] Phone: [phone] Fax:      [phone]

## 2021-03-13 NOTE — Progress Notes (Signed)
**  Xeomin 100 units x 1 vial, [place], Lot [id number], Exp 09/2022, office supply.//mck,rn**

## 2021-05-02 DIAGNOSIS — E785 Hyperlipidemia, unspecified: Secondary | ICD-10-CM | POA: Diagnosis not present

## 2021-05-09 DIAGNOSIS — E785 Hyperlipidemia, unspecified: Secondary | ICD-10-CM | POA: Diagnosis not present

## 2021-05-09 DIAGNOSIS — I700 Atherosclerosis of aorta: Secondary | ICD-10-CM | POA: Diagnosis not present

## 2021-05-09 DIAGNOSIS — Z6822 Body mass index (BMI) 22.0-22.9, adult: Secondary | ICD-10-CM | POA: Diagnosis not present

## 2021-05-09 DIAGNOSIS — G114 Hereditary spastic paraplegia: Secondary | ICD-10-CM | POA: Diagnosis not present

## 2021-06-03 DIAGNOSIS — Z23 Encounter for immunization: Secondary | ICD-10-CM | POA: Diagnosis not present

## 2021-06-03 DIAGNOSIS — W01198A Fall on same level from slipping, tripping and stumbling with subsequent striking against other object, initial encounter: Secondary | ICD-10-CM | POA: Diagnosis not present

## 2021-06-03 DIAGNOSIS — S0101XA Laceration without foreign body of scalp, initial encounter: Secondary | ICD-10-CM | POA: Diagnosis not present

## 2021-06-03 DIAGNOSIS — Y999 Unspecified external cause status: Secondary | ICD-10-CM | POA: Diagnosis not present

## 2021-06-12 ENCOUNTER — Other Ambulatory Visit: Payer: Self-pay

## 2021-06-12 ENCOUNTER — Encounter: Payer: Self-pay | Admitting: Neurology

## 2021-06-12 ENCOUNTER — Ambulatory Visit (INDEPENDENT_AMBULATORY_CARE_PROVIDER_SITE_OTHER): Payer: Medicare Other | Admitting: Neurology

## 2021-06-12 VITALS — BP 140/88 | HR 92 | Ht 66.0 in | Wt 138.5 lb

## 2021-06-12 DIAGNOSIS — G243 Spasmodic torticollis: Secondary | ICD-10-CM

## 2021-06-12 DIAGNOSIS — R269 Unspecified abnormalities of gait and mobility: Secondary | ICD-10-CM

## 2021-06-12 DIAGNOSIS — R471 Dysarthria and anarthria: Secondary | ICD-10-CM

## 2021-06-12 MED ORDER — INCOBOTULINUMTOXINA 100 UNITS IM SOLR
100.0000 [IU] | INTRAMUSCULAR | Status: AC
  Administered 2021-06-12: 100 [IU] via INTRAMUSCULAR

## 2021-06-12 NOTE — Progress Notes (Signed)
**  Xeomin 100 units x 1 vial, [employer] [id number], Lot [id number], Exp 06/2022, office supply.//mck,rn**

## 2021-09-02 DIAGNOSIS — Z20822 Contact with and (suspected) exposure to covid-19: Secondary | ICD-10-CM | POA: Diagnosis not present

## 2021-09-18 ENCOUNTER — Ambulatory Visit (INDEPENDENT_AMBULATORY_CARE_PROVIDER_SITE_OTHER): Payer: Medicare Other | Admitting: Neurology

## 2021-09-18 ENCOUNTER — Encounter: Payer: Self-pay | Admitting: Neurology

## 2021-09-18 VITALS — BP 143/86 | HR 95 | Ht 66.0 in | Wt 138.8 lb

## 2021-09-18 DIAGNOSIS — G243 Spasmodic torticollis: Secondary | ICD-10-CM | POA: Diagnosis not present

## 2021-09-18 MED ORDER — INCOBOTULINUMTOXINA 100 UNITS IM SOLR
100.0000 [IU] | INTRAMUSCULAR | Status: AC
  Administered 2021-09-18: 100 [IU] via INTRAMUSCULAR

## 2021-09-18 NOTE — Progress Notes (Signed)
Xeomin 100 units x 1 vial,  NDC 5597-4163-84,  Lot [id number],  Exp 06/2022,  office supply

## 2021-09-18 NOTE — Progress Notes (Signed)
[facility]  PATIENT: Patrick Daniel DOB: 04/25/1956  HISTORY OF PRESENT ILLNESS: He has past medical history of hyperlipidemia, depression, He presented with gradual onset language difficulty, gait difficulty since 2009. He was evaluated by [facility] neurologist at [facility], reported normal MRI of brain, he was diagnosed with cervical dystonia, was given Botox injection of the cervical muscles, which has improved his symptoms temporarily.  He moved back to the [name] in April 2012, began to seek medical attention, initially evaluated by [name], reported again normal MRI of brain, also had trial of Haldol and baclofen without much improvement.   Sinemet did not help him. Artane and baclofen provide no help either. 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 He was evaluated by local neurosurgeon [name] at [facility], Because concomitant central nervous system degenerative disorder, cervical dystonia, he is hesitate to perform decompression surgery. I later referred him to [facility] neurology, evaluated by [name], who has suggested Botox injection to alleviate his cervical dystonia symptoms first, may consider cervical decompression surgery later. Also, likely there is a component of central nervous system degenerative disorder, such as spinocerebellar ataxia,.    He has significant dysarthria, also dysphagia, gait difficulty, frequent falling, he denies sensory changes. Normal or negative CMP, PEP, CBC, HIV, Lyme disease, SSA/B, TSH, ACE, RPR, CRP, CPK.  He received first EMG guided Botox injection since March 2012, he reported  great improvement, he has been receiving BOTOX injection every 3 months, there is significant  improvement of his abnormal neck postural, but still with severe retrocollis, left turn, right tilt.  he began to have moderate improvement after repeat injection. He was evaluated by his neurosurgeon [name] at [facility], repeat MRI cervical showed advanced multilevel degenerative changes of the cervical spine, most pronounced at C5-C6, where there is severe central stenosis, impression on the cord with focal myelomalacia and severe bilateral neuroforaminal narrowing. Overall appearance is similar to prior study from 09/18/2010. He had posterior decompression  and fusion surgery in December 2013, recovering well, but complains of neck muscle spasm, and chest muscle spasm continued spastic gait difficulty, occasional urinary incontinence, his posture is much improved, but has limited range of motion of his neck  He had EMG guided BOTOX injection by me since March 2012, most recent EMG guided Botox injection was by [name] April 10 2015,300 units of Botox A  He continued to have unsteady gait, difficulty finding a comfortable position, urinary urgency, he denies constipation, denies upper extremity paresthesia or weakness, slow worsening dysarthria,   MRI of the brain in 2016 showed generalized atrophy, most prominent in vermis,  genetic or degenerative processes that may present with both ataxia and dystonia such as the spinal cerebellar ataxias and OPCA.   Additional note is made of a few small T2/flair hyperintense foci consistent with minimal small vessel ischemic changes. MRI of cervical spine: Prior C4-C6 ACDF and C2-T2 posterior fusion. The vertebral bodies are normally aligned.   Metal artifact from these fusions obscures many of the images. Multilevel cervical degenerative disc disease, most significant findings are at C5-C6. There appears to be a posterior bony ridge that mildly narrows the neural canal but does not appear to be causing spinal cord compression. However, there is possible  foraminal narrowing to the right  that could lead to some right C6 nerve root impingement.    MRI lumbar in December 2017 no significant abnormality   he reported long history of heavy alcohol abuse for more than 20 years,  He has continued EMG guided botulinum toxin injection for his cervical dystonia over the past few years, last injection was in February 2020,  He was admitted to the hospital on February 11, 2019 after he fell in his apartment lying on the floor for unknown period of time, upon admission, elevated CPK 1988.  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, he was seen by ID consult, acyclovir treatment for 21 days, he also suffered acute kidney failure, that recovered to normal with creatinine of 0.7 upon discharge, he also had a urinary retention, still using Foley, Flomax, was seen by urologist, doubt he is a candidate for suprapubic catheter, he has worsening gait abnormality, can no longer ambulate without assistant, no bowel incontinence, he also suffered left humeral neck fracture, still has limited range of motion of left arm  He was found to be anemia, hemoglobin dropped to 6, required blood transfusion, suspicious for GI bleeding, further work-up is pending, upon discharge, hemoglobin was 10 on Mar 02, 2019,   He is now home with his daughter staying wheelchair, complains of left shoulder pain  UPDATE Aug 08 2019: He continues to make recovery, is able to ambulate with walker, continue have Foley catheter, has significant neck pain, difficulty sleeping  Update October 26, 2019: He responded well to previous injection  UPDATE April 7th 2021: He lives with his daughter, overall doing well, slow worsening gait abnormality, no longer have significant neck pain, but complains of neck muscle stiffness.  UPDATE May 09 2020: He  continue complains of worsening gait abnormality, spasticity, limited range of motion of neck, neck stiffness, but he no longer have significant abnormal neck pulling, abnormal neck posturing  UPDATE Aug 29 2020: He did well with previous injection, continue complains of right sternocleidomastoid tension, tendency to turning towards the left side, sometimes bilateral posterior cervical muscle spasm tension  UPDATE Dec 05 2020: He has mildly regression, increased weakness following his COVID infection few weeks ago.  He complains of muscle tension but no significant pain, bilateral lower extremity spasticity  Update Mar 13, 2021: He is brought in by his daughter at today's clinical visit, continuing intermittent neck pain, clonazepam is helpful, occasional needed extra tablet  Update September 18, 2021: He continues to benefit low-dose Botox injection, relaxes neck pain, muscle tightness some, REVIEW OF SYSTEMS:  Full 14 system review of systems performed and notable only for those listed, all others are neg:  As above.    ALLERGIES: Allergies  Allergen Reactions   Baclofen Other (See Comments)    Weakness, drowsiness    HOME MEDICATIONS: Outpatient Medications Prior to Visit  Medication Sig Dispense Refill   clonazePAM (KLONOPIN) 0.5 MG tablet Take 1 tablet (0.5 mg total) by mouth at bedtime as needed. May take extra one tab as needed 35 tablet 5   divalproex (DEPAKOTE ER) 500 MG 24 hr tablet Take 1 tablet (500 mg total) by mouth at bedtime. 90 tablet 4   DULoxetine (CYMBALTA) 60 MG capsule Take 1 capsule (60 mg total) by mouth daily. 90 capsule 4   IncobotulinumtoxinA (XEOMIN IM) Inject 100 Units into the muscle every 3 (three) months.  loratadine (CLARITIN) 10 MG tablet Take 5 mg by mouth daily.     mometasone (ELOCON) 0.1 % cream Apply 1 application topically as needed.     rosuvastatin (CRESTOR) 10 MG tablet Take 10 mg by mouth once a week.     traMADol (ULTRAM) 50 MG tablet  Take 1 tablet (50 mg total) by mouth every 12 (twelve) hours as needed. 60 tablet 5   Facility-Administered Medications Prior to Visit  Medication Dose Route Frequency Provider Last Rate Last Admin   incobotulinumtoxinA (XEOMIN) 100 units injection 100 Units  100 Units Intramuscular Q90 days Marcial Pacas, MD   100 Units at 06/12/21 1640    PAST MEDICAL HISTORY: Past Medical History:  Diagnosis Date   Dysarthria    Dystonia    Gait disturbance    Myelopathy (HCC)    Paraplegia ([place])     PAST SURGICAL HISTORY: Past Surgical History:  Procedure Laterality Date   ANTERIOR CERVICAL DECOMP/DISCECTOMY FUSION N/A 10/04/2013   s/p C4-C5, C5-C6 anterior cervical diskectomy and fusion   POSTERIOR CERVICAL FUSION/FORAMINOTOMY N/A 10/04/2013    posterior cervical fusion from C2-T2 with rods and screws and local bone graft and also decompressive laminectomy from the bottom of C2 to the top of T1 with decompression of spinal cord and the nerve roots bilaterally performed by [name] on 10/04/13     FAMILY HISTORY: Family History  Problem Relation Age of Onset   Heart disease Mother    Cancer Mother    Heart Problems Father    High Cholesterol Sister    High blood pressure Sister    Diabetes Sister    High blood pressure Sister     SOCIAL HISTORY: Social History   Socioeconomic History   Marital status: Single    Spouse name: Not on file   Number of children: 1   Years of education: Not on file   Highest education level: Not on file  Occupational History   Occupation: retired    Comment: Therapist, art  Tobacco Use   Smoking status: Former    Types: Cigarettes    Quit date: 02/01/2013    Years since quitting: 8.6   Smokeless tobacco: Never   Tobacco comments:    quit   Substance and Sexual Activity   Alcohol use: No   Drug use: No   Sexual activity: Not on file  Other Topics Concern   Not on file  Social History Narrative   Patient lives in independent living [place]. Patient  is retired from [employer]. Patient has one child. Right handed. Patient is divorced.   Caffeine- 12 - Coffee daily - one daily -soda   Social Determinants of Health   Financial Resource Strain: Not on file  Food Insecurity: Not on file  Transportation Needs: Not on file  Physical Activity: Not on file  Stress: Not on file  Social Connections: Not on file  Intimate Partner Violence: Not on file     PHYSICAL EXAM  PHYSICAL EXAMNIATION:  Gen: NAD, conversant, well nourised, well groomed                     Cardiovascular: Regular rate rhythm, no peripheral edema, warm, nontender. Eyes: Conjunctivae clear without exudates or hemorrhage Neck: Supple, no carotid bruits. Pulmonary: Clear to auscultation bilaterally   NEUROLOGICAL EXAM:  MENTAL STATUS: Speech/Cognition: Awake, alert, slurred almost intelligible speech,  CRANIAL NERVES: CN II: Visual fields are full to confrontation.  Pupils are round equal and  briskly reactive to light. CN III, IV, VI: extraocular movement are normal. No ptosis. CN V: Facial sensation is intact to light touch. CN VII: Face is symmetric with normal eye closure and smile. CN VIII: Hearing is normal to casual conversation CN IX, X: Palate elevates symmetrically. Phonation is normal. CN XI: Head turning and shoulder shrug are intact    MOTOR: Limited range of motion of his neck, moderate upper and lower extremity spasticity, antigravity movement  REFLEXES: Reflexes are 2  and symmetric at the biceps, triceps, 3/3 knees and ankles. Plantar responses are flexor.  SENSORY: Intact to light touch, pinprick, positional and vibratory sensation at fingers and toes.  COORDINATION: There is no trunk or limb ataxia.    GAIT/STANCE: He needs push-up to get up from seated position, tendency to fall backwards, rely on his walker, stiff, unsteady   DIAGNOSTIC DATA (LABS, IMAGING, TESTING) -  ASSESSMENT AND PLAN 65 y.o. 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  High risk for seizure   Continue Depakote ER 500 mg every night for mood stabilizer, seizure prevention  Clonazepam 0.5 mg as needed for insomnia, tramadol as needed for pain  Cervical dystonia:  Limited range of motion of his neck, status post cervical posterior decompression fusion in 2013,  He has limited range of motion of neck, mild left turn, mild retrocollis, mild right shoulder elevation   EMG guided Xeomin injection for cervical dystonia, we used Xeomin 100 units,    Left levator scapula 12.5 units Right levator scapula 12.5 units Left semispinalis 12.5  Left splenius capitis 12.5 units Left splenius cervix 25 units Right splenius cervix 12.5 units.   Right semispinalis 12.5 units    Marcial Pacas, M.D. Ph.D.  [facility] [place], [place] [address] Phone: [phone] Fax:      [phone]

## 2021-09-21 ENCOUNTER — Other Ambulatory Visit: Payer: Self-pay | Admitting: Neurology

## 2021-09-25 ENCOUNTER — Other Ambulatory Visit: Payer: Self-pay | Admitting: Neurology

## 2021-09-25 MED ORDER — CLONAZEPAM 0.5 MG PO TABS
0.5000 mg | ORAL_TABLET | Freq: Every evening | ORAL | 5 refills | Status: DC | PRN
Start: 2021-09-25 — End: 2022-04-03

## 2021-09-25 NOTE — Telephone Encounter (Signed)
[place] Drug registry Verified klonopin LR:08/16/2021 Qty: 78 for 30 days Last OV: 09/18/21 Pending appointment:  12/25/21

## 2021-09-25 NOTE — Telephone Encounter (Signed)
Pt is needing a refill request for his clonazePAM (KLONOPIN) 0.5 MG tablet sent to the [facility] on [address] Pt would like it called in today so daughter can pick it up.

## 2021-10-14 DIAGNOSIS — G249 Dystonia, unspecified: Secondary | ICD-10-CM | POA: Diagnosis not present

## 2021-10-14 DIAGNOSIS — G40909 Epilepsy, unspecified, not intractable, without status epilepticus: Secondary | ICD-10-CM | POA: Diagnosis not present

## 2021-10-14 DIAGNOSIS — G8220 Paraplegia, unspecified: Secondary | ICD-10-CM | POA: Diagnosis not present

## 2021-10-14 DIAGNOSIS — M160 Bilateral primary osteoarthritis of hip: Secondary | ICD-10-CM | POA: Diagnosis not present

## 2021-10-14 DIAGNOSIS — R1310 Dysphagia, unspecified: Secondary | ICD-10-CM | POA: Diagnosis not present

## 2021-10-14 DIAGNOSIS — S72001A Fracture of unspecified part of neck of right femur, initial encounter for closed fracture: Secondary | ICD-10-CM | POA: Diagnosis not present

## 2021-10-14 DIAGNOSIS — Z79899 Other long term (current) drug therapy: Secondary | ICD-10-CM | POA: Diagnosis not present

## 2021-10-14 DIAGNOSIS — S72141A Displaced intertrochanteric fracture of right femur, initial encounter for closed fracture: Secondary | ICD-10-CM | POA: Diagnosis not present

## 2021-10-14 DIAGNOSIS — Z87891 Personal history of nicotine dependence: Secondary | ICD-10-CM | POA: Diagnosis not present

## 2021-10-14 DIAGNOSIS — Y999 Unspecified external cause status: Secondary | ICD-10-CM | POA: Diagnosis not present

## 2021-10-14 DIAGNOSIS — Z20822 Contact with and (suspected) exposure to covid-19: Secondary | ICD-10-CM | POA: Diagnosis not present

## 2021-10-14 DIAGNOSIS — S72101A Unspecified trochanteric fracture of right femur, initial encounter for closed fracture: Secondary | ICD-10-CM | POA: Diagnosis not present

## 2021-10-14 DIAGNOSIS — W1839XA Other fall on same level, initial encounter: Secondary | ICD-10-CM | POA: Diagnosis not present

## 2021-10-14 DIAGNOSIS — G20 Parkinson's disease: Secondary | ICD-10-CM | POA: Diagnosis not present

## 2021-10-14 DIAGNOSIS — R0902 Hypoxemia: Secondary | ICD-10-CM | POA: Diagnosis not present

## 2021-10-14 DIAGNOSIS — Z743 Need for continuous supervision: Secondary | ICD-10-CM | POA: Diagnosis not present

## 2021-10-14 DIAGNOSIS — I10 Essential (primary) hypertension: Secondary | ICD-10-CM | POA: Diagnosis not present

## 2021-10-14 DIAGNOSIS — G629 Polyneuropathy, unspecified: Secondary | ICD-10-CM | POA: Diagnosis not present

## 2021-10-14 DIAGNOSIS — G1119 Other early-onset cerebellar ataxia: Secondary | ICD-10-CM | POA: Diagnosis not present

## 2021-10-14 DIAGNOSIS — Z01810 Encounter for preprocedural cardiovascular examination: Secondary | ICD-10-CM | POA: Diagnosis not present

## 2021-10-17 DIAGNOSIS — R1312 Dysphagia, oropharyngeal phase: Secondary | ICD-10-CM | POA: Diagnosis not present

## 2021-10-17 DIAGNOSIS — Z87891 Personal history of nicotine dependence: Secondary | ICD-10-CM | POA: Diagnosis not present

## 2021-10-17 DIAGNOSIS — R633 Feeding difficulties, unspecified: Secondary | ICD-10-CM | POA: Diagnosis not present

## 2021-10-17 DIAGNOSIS — I10 Essential (primary) hypertension: Secondary | ICD-10-CM | POA: Diagnosis not present

## 2021-10-17 DIAGNOSIS — Z8781 Personal history of (healed) traumatic fracture: Secondary | ICD-10-CM | POA: Diagnosis not present

## 2021-10-17 DIAGNOSIS — G40909 Epilepsy, unspecified, not intractable, without status epilepticus: Secondary | ICD-10-CM | POA: Diagnosis not present

## 2021-10-17 DIAGNOSIS — G629 Polyneuropathy, unspecified: Secondary | ICD-10-CM | POA: Diagnosis not present

## 2021-10-17 DIAGNOSIS — G249 Dystonia, unspecified: Secondary | ICD-10-CM | POA: Diagnosis not present

## 2021-10-17 DIAGNOSIS — S7291XA Unspecified fracture of right femur, initial encounter for closed fracture: Secondary | ICD-10-CM | POA: Diagnosis not present

## 2021-10-17 DIAGNOSIS — R471 Dysarthria and anarthria: Secondary | ICD-10-CM | POA: Diagnosis not present

## 2021-10-17 DIAGNOSIS — R270 Ataxia, unspecified: Secondary | ICD-10-CM | POA: Diagnosis not present

## 2021-10-17 DIAGNOSIS — Z7409 Other reduced mobility: Secondary | ICD-10-CM | POA: Diagnosis not present

## 2021-10-17 DIAGNOSIS — S72101D Unspecified trochanteric fracture of right femur, subsequent encounter for closed fracture with routine healing: Secondary | ICD-10-CM | POA: Diagnosis not present

## 2021-10-17 DIAGNOSIS — Z741 Need for assistance with personal care: Secondary | ICD-10-CM | POA: Diagnosis not present

## 2021-11-06 ENCOUNTER — Other Ambulatory Visit: Payer: Self-pay | Admitting: Neurology

## 2021-11-07 DIAGNOSIS — S72141D Displaced intertrochanteric fracture of right femur, subsequent encounter for closed fracture with routine healing: Secondary | ICD-10-CM | POA: Diagnosis not present

## 2021-11-07 DIAGNOSIS — R569 Unspecified convulsions: Secondary | ICD-10-CM | POA: Diagnosis not present

## 2021-11-07 DIAGNOSIS — M85851 Other specified disorders of bone density and structure, right thigh: Secondary | ICD-10-CM | POA: Diagnosis not present

## 2021-11-07 DIAGNOSIS — Z87891 Personal history of nicotine dependence: Secondary | ICD-10-CM | POA: Diagnosis not present

## 2021-11-07 DIAGNOSIS — Z8619 Personal history of other infectious and parasitic diseases: Secondary | ICD-10-CM | POA: Diagnosis not present

## 2021-11-07 DIAGNOSIS — R339 Retention of urine, unspecified: Secondary | ICD-10-CM | POA: Diagnosis not present

## 2021-11-07 DIAGNOSIS — Z9181 History of falling: Secondary | ICD-10-CM | POA: Diagnosis not present

## 2021-11-07 DIAGNOSIS — Z981 Arthrodesis status: Secondary | ICD-10-CM | POA: Diagnosis not present

## 2021-11-07 DIAGNOSIS — M160 Bilateral primary osteoarthritis of hip: Secondary | ICD-10-CM | POA: Diagnosis not present

## 2021-11-07 DIAGNOSIS — G249 Dystonia, unspecified: Secondary | ICD-10-CM | POA: Diagnosis not present

## 2021-11-07 DIAGNOSIS — G959 Disease of spinal cord, unspecified: Secondary | ICD-10-CM | POA: Diagnosis not present

## 2021-11-07 DIAGNOSIS — I10 Essential (primary) hypertension: Secondary | ICD-10-CM | POA: Diagnosis not present

## 2021-11-07 DIAGNOSIS — Z7982 Long term (current) use of aspirin: Secondary | ICD-10-CM | POA: Diagnosis not present

## 2021-11-07 NOTE — Telephone Encounter (Signed)
[place]ify Drug Registry For Tramadol Hcl 50 Mg Tablet Last Filled: 08/16/2021 Quantity: 60 for 30 days Last appointment: 08/08/2019 Next appointment: 12/25/2021

## 2021-11-08 DIAGNOSIS — S72001D Fracture of unspecified part of neck of right femur, subsequent encounter for closed fracture with routine healing: Secondary | ICD-10-CM | POA: Diagnosis not present

## 2021-11-08 DIAGNOSIS — Z7982 Long term (current) use of aspirin: Secondary | ICD-10-CM | POA: Diagnosis not present

## 2021-11-11 DIAGNOSIS — I10 Essential (primary) hypertension: Secondary | ICD-10-CM | POA: Diagnosis not present

## 2021-11-11 DIAGNOSIS — M160 Bilateral primary osteoarthritis of hip: Secondary | ICD-10-CM | POA: Diagnosis not present

## 2021-11-11 DIAGNOSIS — G249 Dystonia, unspecified: Secondary | ICD-10-CM | POA: Diagnosis not present

## 2021-11-11 DIAGNOSIS — G959 Disease of spinal cord, unspecified: Secondary | ICD-10-CM | POA: Diagnosis not present

## 2021-11-11 DIAGNOSIS — S72141D Displaced intertrochanteric fracture of right femur, subsequent encounter for closed fracture with routine healing: Secondary | ICD-10-CM | POA: Diagnosis not present

## 2021-11-11 DIAGNOSIS — R569 Unspecified convulsions: Secondary | ICD-10-CM | POA: Diagnosis not present

## 2021-11-12 DIAGNOSIS — S72141D Displaced intertrochanteric fracture of right femur, subsequent encounter for closed fracture with routine healing: Secondary | ICD-10-CM | POA: Diagnosis not present

## 2021-11-12 DIAGNOSIS — G249 Dystonia, unspecified: Secondary | ICD-10-CM | POA: Diagnosis not present

## 2021-11-12 DIAGNOSIS — R569 Unspecified convulsions: Secondary | ICD-10-CM | POA: Diagnosis not present

## 2021-11-12 DIAGNOSIS — G959 Disease of spinal cord, unspecified: Secondary | ICD-10-CM | POA: Diagnosis not present

## 2021-11-12 DIAGNOSIS — M160 Bilateral primary osteoarthritis of hip: Secondary | ICD-10-CM | POA: Diagnosis not present

## 2021-11-12 DIAGNOSIS — I10 Essential (primary) hypertension: Secondary | ICD-10-CM | POA: Diagnosis not present

## 2021-11-13 DIAGNOSIS — S72141D Displaced intertrochanteric fracture of right femur, subsequent encounter for closed fracture with routine healing: Secondary | ICD-10-CM | POA: Diagnosis not present

## 2021-11-13 DIAGNOSIS — R569 Unspecified convulsions: Secondary | ICD-10-CM | POA: Diagnosis not present

## 2021-11-13 DIAGNOSIS — I10 Essential (primary) hypertension: Secondary | ICD-10-CM | POA: Diagnosis not present

## 2021-11-13 DIAGNOSIS — M160 Bilateral primary osteoarthritis of hip: Secondary | ICD-10-CM | POA: Diagnosis not present

## 2021-11-13 DIAGNOSIS — G959 Disease of spinal cord, unspecified: Secondary | ICD-10-CM | POA: Diagnosis not present

## 2021-11-13 DIAGNOSIS — G249 Dystonia, unspecified: Secondary | ICD-10-CM | POA: Diagnosis not present

## 2021-11-18 DIAGNOSIS — S72141D Displaced intertrochanteric fracture of right femur, subsequent encounter for closed fracture with routine healing: Secondary | ICD-10-CM | POA: Diagnosis not present

## 2021-11-18 DIAGNOSIS — R569 Unspecified convulsions: Secondary | ICD-10-CM | POA: Diagnosis not present

## 2021-11-18 DIAGNOSIS — I10 Essential (primary) hypertension: Secondary | ICD-10-CM | POA: Diagnosis not present

## 2021-11-18 DIAGNOSIS — G959 Disease of spinal cord, unspecified: Secondary | ICD-10-CM | POA: Diagnosis not present

## 2021-11-18 DIAGNOSIS — M160 Bilateral primary osteoarthritis of hip: Secondary | ICD-10-CM | POA: Diagnosis not present

## 2021-11-18 DIAGNOSIS — G249 Dystonia, unspecified: Secondary | ICD-10-CM | POA: Diagnosis not present

## 2021-11-19 DIAGNOSIS — Z20828 Contact with and (suspected) exposure to other viral communicable diseases: Secondary | ICD-10-CM | POA: Diagnosis not present

## 2021-11-20 DIAGNOSIS — R569 Unspecified convulsions: Secondary | ICD-10-CM | POA: Diagnosis not present

## 2021-11-20 DIAGNOSIS — M160 Bilateral primary osteoarthritis of hip: Secondary | ICD-10-CM | POA: Diagnosis not present

## 2021-11-20 DIAGNOSIS — G249 Dystonia, unspecified: Secondary | ICD-10-CM | POA: Diagnosis not present

## 2021-11-20 DIAGNOSIS — S72141D Displaced intertrochanteric fracture of right femur, subsequent encounter for closed fracture with routine healing: Secondary | ICD-10-CM | POA: Diagnosis not present

## 2021-11-20 DIAGNOSIS — G959 Disease of spinal cord, unspecified: Secondary | ICD-10-CM | POA: Diagnosis not present

## 2021-11-20 DIAGNOSIS — I10 Essential (primary) hypertension: Secondary | ICD-10-CM | POA: Diagnosis not present

## 2021-11-22 DIAGNOSIS — G249 Dystonia, unspecified: Secondary | ICD-10-CM | POA: Diagnosis not present

## 2021-11-22 DIAGNOSIS — S72141D Displaced intertrochanteric fracture of right femur, subsequent encounter for closed fracture with routine healing: Secondary | ICD-10-CM | POA: Diagnosis not present

## 2021-11-22 DIAGNOSIS — I10 Essential (primary) hypertension: Secondary | ICD-10-CM | POA: Diagnosis not present

## 2021-11-22 DIAGNOSIS — M160 Bilateral primary osteoarthritis of hip: Secondary | ICD-10-CM | POA: Diagnosis not present

## 2021-11-22 DIAGNOSIS — G959 Disease of spinal cord, unspecified: Secondary | ICD-10-CM | POA: Diagnosis not present

## 2021-11-22 DIAGNOSIS — R569 Unspecified convulsions: Secondary | ICD-10-CM | POA: Diagnosis not present

## 2021-11-25 DIAGNOSIS — R569 Unspecified convulsions: Secondary | ICD-10-CM | POA: Diagnosis not present

## 2021-11-25 DIAGNOSIS — G959 Disease of spinal cord, unspecified: Secondary | ICD-10-CM | POA: Diagnosis not present

## 2021-11-25 DIAGNOSIS — Z20822 Contact with and (suspected) exposure to covid-19: Secondary | ICD-10-CM | POA: Diagnosis not present

## 2021-11-25 DIAGNOSIS — M160 Bilateral primary osteoarthritis of hip: Secondary | ICD-10-CM | POA: Diagnosis not present

## 2021-11-25 DIAGNOSIS — G249 Dystonia, unspecified: Secondary | ICD-10-CM | POA: Diagnosis not present

## 2021-11-25 DIAGNOSIS — I10 Essential (primary) hypertension: Secondary | ICD-10-CM | POA: Diagnosis not present

## 2021-11-25 DIAGNOSIS — S72141D Displaced intertrochanteric fracture of right femur, subsequent encounter for closed fracture with routine healing: Secondary | ICD-10-CM | POA: Diagnosis not present

## 2021-11-28 DIAGNOSIS — Z20822 Contact with and (suspected) exposure to covid-19: Secondary | ICD-10-CM | POA: Diagnosis not present

## 2021-12-05 DIAGNOSIS — G249 Dystonia, unspecified: Secondary | ICD-10-CM | POA: Diagnosis not present

## 2021-12-05 DIAGNOSIS — M160 Bilateral primary osteoarthritis of hip: Secondary | ICD-10-CM | POA: Diagnosis not present

## 2021-12-05 DIAGNOSIS — R569 Unspecified convulsions: Secondary | ICD-10-CM | POA: Diagnosis not present

## 2021-12-05 DIAGNOSIS — S72141D Displaced intertrochanteric fracture of right femur, subsequent encounter for closed fracture with routine healing: Secondary | ICD-10-CM | POA: Diagnosis not present

## 2021-12-05 DIAGNOSIS — I10 Essential (primary) hypertension: Secondary | ICD-10-CM | POA: Diagnosis not present

## 2021-12-05 DIAGNOSIS — G959 Disease of spinal cord, unspecified: Secondary | ICD-10-CM | POA: Diagnosis not present

## 2021-12-06 DIAGNOSIS — I10 Essential (primary) hypertension: Secondary | ICD-10-CM | POA: Diagnosis not present

## 2021-12-06 DIAGNOSIS — G249 Dystonia, unspecified: Secondary | ICD-10-CM | POA: Diagnosis not present

## 2021-12-06 DIAGNOSIS — G959 Disease of spinal cord, unspecified: Secondary | ICD-10-CM | POA: Diagnosis not present

## 2021-12-06 DIAGNOSIS — S72141D Displaced intertrochanteric fracture of right femur, subsequent encounter for closed fracture with routine healing: Secondary | ICD-10-CM | POA: Diagnosis not present

## 2021-12-06 DIAGNOSIS — M160 Bilateral primary osteoarthritis of hip: Secondary | ICD-10-CM | POA: Diagnosis not present

## 2021-12-06 DIAGNOSIS — R569 Unspecified convulsions: Secondary | ICD-10-CM | POA: Diagnosis not present

## 2021-12-07 DIAGNOSIS — Z87891 Personal history of nicotine dependence: Secondary | ICD-10-CM | POA: Diagnosis not present

## 2021-12-07 DIAGNOSIS — G249 Dystonia, unspecified: Secondary | ICD-10-CM | POA: Diagnosis not present

## 2021-12-07 DIAGNOSIS — R569 Unspecified convulsions: Secondary | ICD-10-CM | POA: Diagnosis not present

## 2021-12-07 DIAGNOSIS — G959 Disease of spinal cord, unspecified: Secondary | ICD-10-CM | POA: Diagnosis not present

## 2021-12-07 DIAGNOSIS — Z7982 Long term (current) use of aspirin: Secondary | ICD-10-CM | POA: Diagnosis not present

## 2021-12-07 DIAGNOSIS — R339 Retention of urine, unspecified: Secondary | ICD-10-CM | POA: Diagnosis not present

## 2021-12-07 DIAGNOSIS — Z981 Arthrodesis status: Secondary | ICD-10-CM | POA: Diagnosis not present

## 2021-12-07 DIAGNOSIS — I10 Essential (primary) hypertension: Secondary | ICD-10-CM | POA: Diagnosis not present

## 2021-12-07 DIAGNOSIS — M85851 Other specified disorders of bone density and structure, right thigh: Secondary | ICD-10-CM | POA: Diagnosis not present

## 2021-12-07 DIAGNOSIS — Z9181 History of falling: Secondary | ICD-10-CM | POA: Diagnosis not present

## 2021-12-07 DIAGNOSIS — S72141D Displaced intertrochanteric fracture of right femur, subsequent encounter for closed fracture with routine healing: Secondary | ICD-10-CM | POA: Diagnosis not present

## 2021-12-07 DIAGNOSIS — Z8619 Personal history of other infectious and parasitic diseases: Secondary | ICD-10-CM | POA: Diagnosis not present

## 2021-12-07 DIAGNOSIS — M160 Bilateral primary osteoarthritis of hip: Secondary | ICD-10-CM | POA: Diagnosis not present

## 2021-12-09 DIAGNOSIS — G249 Dystonia, unspecified: Secondary | ICD-10-CM | POA: Diagnosis not present

## 2021-12-09 DIAGNOSIS — G959 Disease of spinal cord, unspecified: Secondary | ICD-10-CM | POA: Diagnosis not present

## 2021-12-09 DIAGNOSIS — Z20822 Contact with and (suspected) exposure to covid-19: Secondary | ICD-10-CM | POA: Diagnosis not present

## 2021-12-09 DIAGNOSIS — I10 Essential (primary) hypertension: Secondary | ICD-10-CM | POA: Diagnosis not present

## 2021-12-09 DIAGNOSIS — M160 Bilateral primary osteoarthritis of hip: Secondary | ICD-10-CM | POA: Diagnosis not present

## 2021-12-09 DIAGNOSIS — R569 Unspecified convulsions: Secondary | ICD-10-CM | POA: Diagnosis not present

## 2021-12-09 DIAGNOSIS — S72141D Displaced intertrochanteric fracture of right femur, subsequent encounter for closed fracture with routine healing: Secondary | ICD-10-CM | POA: Diagnosis not present

## 2021-12-13 DIAGNOSIS — I10 Essential (primary) hypertension: Secondary | ICD-10-CM | POA: Diagnosis not present

## 2021-12-13 DIAGNOSIS — M160 Bilateral primary osteoarthritis of hip: Secondary | ICD-10-CM | POA: Diagnosis not present

## 2021-12-13 DIAGNOSIS — G249 Dystonia, unspecified: Secondary | ICD-10-CM | POA: Diagnosis not present

## 2021-12-13 DIAGNOSIS — G959 Disease of spinal cord, unspecified: Secondary | ICD-10-CM | POA: Diagnosis not present

## 2021-12-13 DIAGNOSIS — R569 Unspecified convulsions: Secondary | ICD-10-CM | POA: Diagnosis not present

## 2021-12-13 DIAGNOSIS — S72141D Displaced intertrochanteric fracture of right femur, subsequent encounter for closed fracture with routine healing: Secondary | ICD-10-CM | POA: Diagnosis not present

## 2021-12-16 DIAGNOSIS — R569 Unspecified convulsions: Secondary | ICD-10-CM | POA: Diagnosis not present

## 2021-12-16 DIAGNOSIS — G959 Disease of spinal cord, unspecified: Secondary | ICD-10-CM | POA: Diagnosis not present

## 2021-12-16 DIAGNOSIS — G249 Dystonia, unspecified: Secondary | ICD-10-CM | POA: Diagnosis not present

## 2021-12-16 DIAGNOSIS — I10 Essential (primary) hypertension: Secondary | ICD-10-CM | POA: Diagnosis not present

## 2021-12-16 DIAGNOSIS — M160 Bilateral primary osteoarthritis of hip: Secondary | ICD-10-CM | POA: Diagnosis not present

## 2021-12-16 DIAGNOSIS — Z20822 Contact with and (suspected) exposure to covid-19: Secondary | ICD-10-CM | POA: Diagnosis not present

## 2021-12-16 DIAGNOSIS — S72141D Displaced intertrochanteric fracture of right femur, subsequent encounter for closed fracture with routine healing: Secondary | ICD-10-CM | POA: Diagnosis not present

## 2021-12-19 DIAGNOSIS — Z20822 Contact with and (suspected) exposure to covid-19: Secondary | ICD-10-CM | POA: Diagnosis not present

## 2021-12-20 DIAGNOSIS — G959 Disease of spinal cord, unspecified: Secondary | ICD-10-CM | POA: Diagnosis not present

## 2021-12-20 DIAGNOSIS — I10 Essential (primary) hypertension: Secondary | ICD-10-CM | POA: Diagnosis not present

## 2021-12-20 DIAGNOSIS — M160 Bilateral primary osteoarthritis of hip: Secondary | ICD-10-CM | POA: Diagnosis not present

## 2021-12-20 DIAGNOSIS — G249 Dystonia, unspecified: Secondary | ICD-10-CM | POA: Diagnosis not present

## 2021-12-20 DIAGNOSIS — S72141D Displaced intertrochanteric fracture of right femur, subsequent encounter for closed fracture with routine healing: Secondary | ICD-10-CM | POA: Diagnosis not present

## 2021-12-20 DIAGNOSIS — R569 Unspecified convulsions: Secondary | ICD-10-CM | POA: Diagnosis not present

## 2021-12-24 DIAGNOSIS — G249 Dystonia, unspecified: Secondary | ICD-10-CM | POA: Diagnosis not present

## 2021-12-24 DIAGNOSIS — G959 Disease of spinal cord, unspecified: Secondary | ICD-10-CM | POA: Diagnosis not present

## 2021-12-24 DIAGNOSIS — I10 Essential (primary) hypertension: Secondary | ICD-10-CM | POA: Diagnosis not present

## 2021-12-24 DIAGNOSIS — M160 Bilateral primary osteoarthritis of hip: Secondary | ICD-10-CM | POA: Diagnosis not present

## 2021-12-24 DIAGNOSIS — S72141D Displaced intertrochanteric fracture of right femur, subsequent encounter for closed fracture with routine healing: Secondary | ICD-10-CM | POA: Diagnosis not present

## 2021-12-24 DIAGNOSIS — R569 Unspecified convulsions: Secondary | ICD-10-CM | POA: Diagnosis not present

## 2021-12-25 ENCOUNTER — Ambulatory Visit: Payer: Medicare Other | Admitting: Neurology

## 2021-12-27 DIAGNOSIS — S72141D Displaced intertrochanteric fracture of right femur, subsequent encounter for closed fracture with routine healing: Secondary | ICD-10-CM | POA: Diagnosis not present

## 2021-12-27 DIAGNOSIS — I10 Essential (primary) hypertension: Secondary | ICD-10-CM | POA: Diagnosis not present

## 2021-12-27 DIAGNOSIS — G249 Dystonia, unspecified: Secondary | ICD-10-CM | POA: Diagnosis not present

## 2021-12-27 DIAGNOSIS — R569 Unspecified convulsions: Secondary | ICD-10-CM | POA: Diagnosis not present

## 2021-12-27 DIAGNOSIS — G959 Disease of spinal cord, unspecified: Secondary | ICD-10-CM | POA: Diagnosis not present

## 2021-12-27 DIAGNOSIS — M160 Bilateral primary osteoarthritis of hip: Secondary | ICD-10-CM | POA: Diagnosis not present

## 2021-12-31 DIAGNOSIS — G249 Dystonia, unspecified: Secondary | ICD-10-CM | POA: Diagnosis not present

## 2021-12-31 DIAGNOSIS — I10 Essential (primary) hypertension: Secondary | ICD-10-CM | POA: Diagnosis not present

## 2021-12-31 DIAGNOSIS — R569 Unspecified convulsions: Secondary | ICD-10-CM | POA: Diagnosis not present

## 2021-12-31 DIAGNOSIS — M160 Bilateral primary osteoarthritis of hip: Secondary | ICD-10-CM | POA: Diagnosis not present

## 2021-12-31 DIAGNOSIS — G959 Disease of spinal cord, unspecified: Secondary | ICD-10-CM | POA: Diagnosis not present

## 2021-12-31 DIAGNOSIS — S72141D Displaced intertrochanteric fracture of right femur, subsequent encounter for closed fracture with routine healing: Secondary | ICD-10-CM | POA: Diagnosis not present

## 2022-01-01 DIAGNOSIS — Z9889 Other specified postprocedural states: Secondary | ICD-10-CM | POA: Diagnosis not present

## 2022-01-01 DIAGNOSIS — S72141D Displaced intertrochanteric fracture of right femur, subsequent encounter for closed fracture with routine healing: Secondary | ICD-10-CM | POA: Diagnosis not present

## 2022-01-01 DIAGNOSIS — S72001D Fracture of unspecified part of neck of right femur, subsequent encounter for closed fracture with routine healing: Secondary | ICD-10-CM | POA: Diagnosis not present

## 2022-01-01 DIAGNOSIS — M25361 Other instability, right knee: Secondary | ICD-10-CM | POA: Diagnosis not present

## 2022-01-02 DIAGNOSIS — S72141D Displaced intertrochanteric fracture of right femur, subsequent encounter for closed fracture with routine healing: Secondary | ICD-10-CM | POA: Diagnosis not present

## 2022-01-02 DIAGNOSIS — M160 Bilateral primary osteoarthritis of hip: Secondary | ICD-10-CM | POA: Diagnosis not present

## 2022-01-02 DIAGNOSIS — I10 Essential (primary) hypertension: Secondary | ICD-10-CM | POA: Diagnosis not present

## 2022-01-02 DIAGNOSIS — G249 Dystonia, unspecified: Secondary | ICD-10-CM | POA: Diagnosis not present

## 2022-01-02 DIAGNOSIS — G959 Disease of spinal cord, unspecified: Secondary | ICD-10-CM | POA: Diagnosis not present

## 2022-01-02 DIAGNOSIS — R569 Unspecified convulsions: Secondary | ICD-10-CM | POA: Diagnosis not present

## 2022-01-08 DIAGNOSIS — Z20822 Contact with and (suspected) exposure to covid-19: Secondary | ICD-10-CM | POA: Diagnosis not present

## 2022-01-12 DIAGNOSIS — Z20822 Contact with and (suspected) exposure to covid-19: Secondary | ICD-10-CM | POA: Diagnosis not present

## 2022-01-15 ENCOUNTER — Ambulatory Visit (INDEPENDENT_AMBULATORY_CARE_PROVIDER_SITE_OTHER): Payer: Medicare Other | Admitting: Neurology

## 2022-01-15 ENCOUNTER — Other Ambulatory Visit: Payer: Self-pay

## 2022-01-15 ENCOUNTER — Encounter: Payer: Self-pay | Admitting: Neurology

## 2022-01-15 VITALS — BP 137/74 | HR 70

## 2022-01-15 DIAGNOSIS — G319 Degenerative disease of nervous system, unspecified: Secondary | ICD-10-CM | POA: Diagnosis not present

## 2022-01-15 DIAGNOSIS — G243 Spasmodic torticollis: Secondary | ICD-10-CM | POA: Diagnosis not present

## 2022-01-15 DIAGNOSIS — R471 Dysarthria and anarthria: Secondary | ICD-10-CM

## 2022-01-15 DIAGNOSIS — R269 Unspecified abnormalities of gait and mobility: Secondary | ICD-10-CM

## 2022-01-15 DIAGNOSIS — Z20822 Contact with and (suspected) exposure to covid-19: Secondary | ICD-10-CM | POA: Diagnosis not present

## 2022-01-15 MED ORDER — INCOBOTULINUMTOXINA 100 UNITS IM SOLR
200.0000 [IU] | INTRAMUSCULAR | Status: AC
  Administered 2022-01-15: 200 [IU] via INTRAMUSCULAR

## 2022-01-15 NOTE — Progress Notes (Signed)
? ? ?[facility] ? ?PATIENT: Patrick Daniel ?DOB: Jan 31, 1956 ? ?HISTORY OF PRESENT ILLNESS: ?He has past medical history of hyperlipidemia, depression, He presented with gradual onset language difficulty, gait difficulty since 2009. ?He was evaluated by [facility] neurologist at [facility], reported normal MRI of brain, he was diagnosed with cervical dystonia, was given Botox injection of the cervical muscles, which has improved his symptoms temporarily. ? ?He moved back to the [name] in April 2012, began to seek medical attention, initially evaluated by [name], reported again normal MRI of brain, also had trial of Haldol and baclofen without much improvement.   ?Sinemet did not help him. Artane and baclofen provide no help either. 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 ?He was evaluated by local neurosurgeon [name] at [facility], Because concomitant central nervous system degenerative disorder, cervical dystonia, he is hesitate to perform decompression surgery. I later referred him to [facility] neurology, evaluated by [name], who has suggested Botox injection to alleviate his cervical dystonia symptoms first, may consider cervical decompression surgery later. Also, likely there is a component of central nervous system degenerative disorder, such as spinocerebellar ataxia,. ?   ?He has significant dysarthria, also dysphagia, gait difficulty, frequent falling, he denies sensory changes. ?Normal or negative CMP, PEP, CBC, HIV, Lyme disease, SSA/B, TSH, ACE, RPR, CRP, CPK. ? ?He received first EMG guided Botox injection since March 2012, he reported  great improvement, he has been receiving BOTOX injection every 3 months, there is significant  improvement of his abnormal neck postural, but still with severe retrocollis, left turn, right tilt.  he began to have moderate improvement after repeat injection. ?He was evaluated by his neurosurgeon [name] at [facility], repeat MRI cervical showed advanced multilevel degenerative changes of the cervical spine, most pronounced at C5-C6, where there is severe central stenosis, impression on the cord with focal myelomalacia and severe bilateral neuroforaminal narrowing. Overall appearance is similar to prior study from 09/18/2010. He had posterior decompression  and fusion surgery in December 2013, recovering well, but complains of neck muscle spasm, and chest muscle spasm continued spastic gait difficulty, occasional urinary incontinence, his posture is much improved, but has limited range of motion of his neck ? ?He had EMG guided BOTOX injection by me since March 2012, most recent EMG guided Botox injection was by [name] April 10 2015,300 units of Botox A ? ?He continued to have unsteady gait, difficulty finding a comfortable position, urinary urgency, he denies constipation, denies upper extremity paresthesia or weakness, slow worsening dysarthria, ?  ?MRI of the brain in 2016 showed generalized atrophy, most prominent in vermis,  genetic or degenerative processes that may present with both ataxia and dystonia such as the spinal cerebellar ataxias and OPCA.   Additional note is made of a few small T2/flair hyperintense foci consistent with minimal small vessel ischemic changes. ?MRI of cervical spine: Prior C4-C6 ACDF and C2-T2 posterior fusion. The vertebral bodies are normally aligned.   Metal artifact from these fusions obscures many of the images. Multilevel cervical degenerative disc disease, most significant findings are at C5-C6. There appears to be a posterior bony ridge that mildly narrows the neural canal but does not appear to be causing spinal cord compression. However, there is possible  foraminal narrowing to the right  that could lead to some right C6 nerve root impingement.   ? ?MRI lumbar in December 2017 no significant abnormality ?  ?he reported long history of heavy alcohol abuse for more than 20 years, ? ?He has continued EMG guided botulinum toxin injection for his cervical dystonia over the past few years, last injection was in February 2020, ? ?He was admitted to the hospital on February 11, 2019 after he fell in his apartment lying on the floor for unknown period of time, upon admission, elevated CPK 1988.  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 ?He was found to be anemia, hemoglobin dropped to 6, required blood transfusion, suspicious for GI bleeding, further work-up is pending, upon discharge, hemoglobin was 10 on Mar 02, 2019, ?  ?He is now home with his daughter staying wheelchair, complains of left shoulder pain ? ?UPDATE Aug 08 2019: ?He continues to make recovery, is able to ambulate with walker, continue have Foley catheter, has significant neck pain, difficulty sleeping ? ?Update October 26, 2019: ?He responded well to previous injection ? ?UPDATE April 7th 2021: ?He lives with his daughter, overall doing well, slow worsening gait abnormality, no longer have significant neck pain, but complains of neck muscle stiffness. ? ?UPDATE May 09 2020: ?He  continue complains of worsening gait abnormality, spasticity, limited range of motion of neck, neck stiffness, but he no longer have significant abnormal neck pulling, abnormal neck posturing ? ?UPDATE Aug 29 2020: ?He did well with previous injection, continue complains of right sternocleidomastoid tension, tendency to turning towards the left side, sometimes bilateral posterior cervical muscle spasm tension ? ?UPDATE Dec 05 2020: ?He has mildly regression, increased weakness following his COVID infection few weeks ago.  He complains of muscle tension but no significant pain, bilateral lower extremity spasticity ? ?Update Mar 13, 2021: ?He is brought in by his daughter at today's clinical visit, continuing intermittent neck pain, clonazepam is helpful, occasional needed extra tablet ? ?Update September 18, 2021: ?He continues to benefit low-dose Botox injection, relaxes neck pain, muscle tightness some, ? ?Update March 2023: ?Patient has slow worsening of his dysarthria, slurred speech, denies significant dysphagia, worsening gait abnormality, he fell, broke his right hip in December 2022, require surgery, this was done at [facility], he is recovering well overall, denies significant pain ? ?Patient only ambulated with walker, denies bowel and bladder incontinence, denies sensory changes ? ?Overall the years, he is on lower dose of xeomin injection only 100 units, he has a lot of neck muscle tightness, but denies significant pain, ? ?REVIEW OF SYSTEMS:  ?Full 14 system review of systems performed and notable only for those listed, all others are neg:  ?As above. ? ? ? ?ALLERGIES: ?Allergies  ?Allergen Reactions  ? Baclofen Other (See Comments)  ?  Weakness, drowsiness  ? ? ?HOME MEDICATIONS: ?Outpatient Medications Prior to Visit  ?Medication Sig Dispense Refill  ? clonazePAM (  KLONOPIN) 0.5 MG tablet Take 1 tablet (0.5 mg total) by mouth at bedtime as needed. May take extra one tab as needed 35 tablet 5  ?  divalproex (DEPAKOTE ER) 500 MG 24 hr tablet Take 1 tablet (500 mg total) by mouth at bedtime. 90 tablet 4  ? DULoxetine (CYMBALTA) 60 MG capsule Take 1 capsule (60 mg total) by mouth daily. 90 capsule 4  ? Incobotulin

## 2022-01-15 NOTE — Progress Notes (Signed)
Xeomin 100 units ?[id number] ?[phone] ?Exp-2024-12 ?B/B ?

## 2022-01-17 DIAGNOSIS — Z20822 Contact with and (suspected) exposure to covid-19: Secondary | ICD-10-CM | POA: Diagnosis not present

## 2022-01-21 DIAGNOSIS — Z20822 Contact with and (suspected) exposure to covid-19: Secondary | ICD-10-CM | POA: Diagnosis not present

## 2022-01-30 DIAGNOSIS — G959 Disease of spinal cord, unspecified: Secondary | ICD-10-CM | POA: Diagnosis not present

## 2022-01-30 DIAGNOSIS — M899 Disorder of bone, unspecified: Secondary | ICD-10-CM | POA: Diagnosis not present

## 2022-01-30 DIAGNOSIS — M949 Disorder of cartilage, unspecified: Secondary | ICD-10-CM | POA: Diagnosis not present

## 2022-01-30 DIAGNOSIS — S72001A Fracture of unspecified part of neck of right femur, initial encounter for closed fracture: Secondary | ICD-10-CM | POA: Diagnosis not present

## 2022-01-30 DIAGNOSIS — S72001D Fracture of unspecified part of neck of right femur, subsequent encounter for closed fracture with routine healing: Secondary | ICD-10-CM | POA: Diagnosis not present

## 2022-01-30 DIAGNOSIS — G249 Dystonia, unspecified: Secondary | ICD-10-CM | POA: Diagnosis not present

## 2022-02-03 DIAGNOSIS — Z20822 Contact with and (suspected) exposure to covid-19: Secondary | ICD-10-CM | POA: Diagnosis not present

## 2022-02-04 DIAGNOSIS — R059 Cough, unspecified: Secondary | ICD-10-CM | POA: Diagnosis not present

## 2022-02-04 DIAGNOSIS — Z20822 Contact with and (suspected) exposure to covid-19: Secondary | ICD-10-CM | POA: Diagnosis not present

## 2022-02-04 DIAGNOSIS — R051 Acute cough: Secondary | ICD-10-CM | POA: Diagnosis not present

## 2022-02-18 DIAGNOSIS — Z1152 Encounter for screening for COVID-19: Secondary | ICD-10-CM | POA: Diagnosis not present

## 2022-02-18 DIAGNOSIS — Z20828 Contact with and (suspected) exposure to other viral communicable diseases: Secondary | ICD-10-CM | POA: Diagnosis not present

## 2022-02-18 DIAGNOSIS — Z20822 Contact with and (suspected) exposure to covid-19: Secondary | ICD-10-CM | POA: Diagnosis not present

## 2022-02-22 DIAGNOSIS — Z20822 Contact with and (suspected) exposure to covid-19: Secondary | ICD-10-CM | POA: Diagnosis not present

## 2022-02-24 DIAGNOSIS — Z20822 Contact with and (suspected) exposure to covid-19: Secondary | ICD-10-CM | POA: Diagnosis not present

## 2022-02-25 DIAGNOSIS — M810 Age-related osteoporosis without current pathological fracture: Secondary | ICD-10-CM | POA: Diagnosis not present

## 2022-02-25 DIAGNOSIS — Z20822 Contact with and (suspected) exposure to covid-19: Secondary | ICD-10-CM | POA: Diagnosis not present

## 2022-03-17 ENCOUNTER — Other Ambulatory Visit: Payer: Self-pay | Admitting: Neurology

## 2022-03-19 DIAGNOSIS — Z20828 Contact with and (suspected) exposure to other viral communicable diseases: Secondary | ICD-10-CM | POA: Diagnosis not present

## 2022-03-25 DIAGNOSIS — Z20828 Contact with and (suspected) exposure to other viral communicable diseases: Secondary | ICD-10-CM | POA: Diagnosis not present

## 2022-04-02 ENCOUNTER — Ambulatory Visit: Payer: Medicare Other | Admitting: Neurology

## 2022-04-02 ENCOUNTER — Other Ambulatory Visit: Payer: Self-pay | Admitting: Neurology

## 2022-04-02 NOTE — Telephone Encounter (Signed)
Verify Drug Registry For Clonazepam 0.5 Mg Tablet Last Filled: 02/28/2022 Quantity: 35 tablets for 30 days Last appointment: 01/15/2022 (procedure visit) Next appointment: 07/23/2022  Verify Drug Registry For Tramadol Hcl 50 Mg Tablet Last Filled: 11/07/2021 Quantity: 60 tablets for 30 days

## 2022-04-03 NOTE — Telephone Encounter (Signed)
Meds ordered this encounter  Medications   clonazePAM (KLONOPIN) 0.5 MG tablet    Sig: Take 1 tablet (0.5 mg total) by mouth at bedtime as needed for anxiety.    Dispense:  35 tablet    Refill:  1   traMADol (ULTRAM) 50 MG tablet    Sig: Take 1 tablet (50 mg total) by mouth every 12 (twelve) hours as needed.    Dispense:  60 tablet    Refill:  1

## 2022-04-10 DIAGNOSIS — R410 Disorientation, unspecified: Secondary | ICD-10-CM | POA: Diagnosis not present

## 2022-04-10 DIAGNOSIS — I10 Essential (primary) hypertension: Secondary | ICD-10-CM | POA: Diagnosis present

## 2022-04-10 DIAGNOSIS — R059 Cough, unspecified: Secondary | ICD-10-CM | POA: Diagnosis not present

## 2022-04-10 DIAGNOSIS — B9789 Other viral agents as the cause of diseases classified elsewhere: Secondary | ICD-10-CM | POA: Diagnosis not present

## 2022-04-10 DIAGNOSIS — R262 Difficulty in walking, not elsewhere classified: Secondary | ICD-10-CM | POA: Diagnosis not present

## 2022-04-10 DIAGNOSIS — F419 Anxiety disorder, unspecified: Secondary | ICD-10-CM | POA: Diagnosis not present

## 2022-04-10 DIAGNOSIS — R509 Fever, unspecified: Secondary | ICD-10-CM | POA: Diagnosis not present

## 2022-04-10 DIAGNOSIS — Z91018 Allergy to other foods: Secondary | ICD-10-CM | POA: Diagnosis not present

## 2022-04-10 DIAGNOSIS — R278 Other lack of coordination: Secondary | ICD-10-CM | POA: Diagnosis not present

## 2022-04-10 DIAGNOSIS — R918 Other nonspecific abnormal finding of lung field: Secondary | ICD-10-CM | POA: Diagnosis not present

## 2022-04-10 DIAGNOSIS — W06XXXA Fall from bed, initial encounter: Secondary | ICD-10-CM | POA: Diagnosis not present

## 2022-04-10 DIAGNOSIS — Z87891 Personal history of nicotine dependence: Secondary | ICD-10-CM | POA: Diagnosis not present

## 2022-04-10 DIAGNOSIS — B9689 Other specified bacterial agents as the cause of diseases classified elsewhere: Secondary | ICD-10-CM | POA: Diagnosis present

## 2022-04-10 DIAGNOSIS — A4189 Other specified sepsis: Secondary | ICD-10-CM | POA: Diagnosis not present

## 2022-04-10 DIAGNOSIS — Z7982 Long term (current) use of aspirin: Secondary | ICD-10-CM | POA: Diagnosis not present

## 2022-04-10 DIAGNOSIS — J329 Chronic sinusitis, unspecified: Secondary | ICD-10-CM | POA: Diagnosis not present

## 2022-04-10 DIAGNOSIS — G248 Other dystonia: Secondary | ICD-10-CM | POA: Diagnosis not present

## 2022-04-10 DIAGNOSIS — B348 Other viral infections of unspecified site: Secondary | ICD-10-CM | POA: Diagnosis not present

## 2022-04-10 DIAGNOSIS — G40909 Epilepsy, unspecified, not intractable, without status epilepticus: Secondary | ICD-10-CM | POA: Diagnosis present

## 2022-04-10 DIAGNOSIS — J019 Acute sinusitis, unspecified: Secondary | ICD-10-CM | POA: Diagnosis present

## 2022-04-10 DIAGNOSIS — G249 Dystonia, unspecified: Secondary | ICD-10-CM | POA: Diagnosis present

## 2022-04-10 DIAGNOSIS — A419 Sepsis, unspecified organism: Secondary | ICD-10-CM | POA: Diagnosis not present

## 2022-04-10 DIAGNOSIS — R296 Repeated falls: Secondary | ICD-10-CM | POA: Diagnosis present

## 2022-04-10 DIAGNOSIS — Y999 Unspecified external cause status: Secondary | ICD-10-CM | POA: Diagnosis not present

## 2022-04-10 DIAGNOSIS — Z79899 Other long term (current) drug therapy: Secondary | ICD-10-CM | POA: Diagnosis not present

## 2022-04-10 DIAGNOSIS — J309 Allergic rhinitis, unspecified: Secondary | ICD-10-CM | POA: Diagnosis not present

## 2022-04-10 DIAGNOSIS — R569 Unspecified convulsions: Secondary | ICD-10-CM | POA: Diagnosis not present

## 2022-04-10 DIAGNOSIS — Z20822 Contact with and (suspected) exposure to covid-19: Secondary | ICD-10-CM | POA: Diagnosis present

## 2022-04-10 DIAGNOSIS — J0100 Acute maxillary sinusitis, unspecified: Secondary | ICD-10-CM | POA: Diagnosis not present

## 2022-04-10 DIAGNOSIS — R2689 Other abnormalities of gait and mobility: Secondary | ICD-10-CM | POA: Diagnosis not present

## 2022-04-10 DIAGNOSIS — E871 Hypo-osmolality and hyponatremia: Secondary | ICD-10-CM | POA: Diagnosis present

## 2022-04-10 DIAGNOSIS — B341 Enterovirus infection, unspecified: Secondary | ICD-10-CM | POA: Diagnosis not present

## 2022-04-10 DIAGNOSIS — B9710 Unspecified enterovirus as the cause of diseases classified elsewhere: Secondary | ICD-10-CM | POA: Diagnosis present

## 2022-04-10 DIAGNOSIS — D649 Anemia, unspecified: Secondary | ICD-10-CM | POA: Diagnosis present

## 2022-04-10 DIAGNOSIS — F32A Depression, unspecified: Secondary | ICD-10-CM | POA: Diagnosis present

## 2022-04-10 DIAGNOSIS — Z888 Allergy status to other drugs, medicaments and biological substances status: Secondary | ICD-10-CM | POA: Diagnosis not present

## 2022-04-10 DIAGNOSIS — R41841 Cognitive communication deficit: Secondary | ICD-10-CM | POA: Diagnosis not present

## 2022-04-10 DIAGNOSIS — E559 Vitamin D deficiency, unspecified: Secondary | ICD-10-CM | POA: Diagnosis not present

## 2022-04-10 DIAGNOSIS — G243 Spasmodic torticollis: Secondary | ICD-10-CM | POA: Diagnosis not present

## 2022-04-10 DIAGNOSIS — M6281 Muscle weakness (generalized): Secondary | ICD-10-CM | POA: Diagnosis not present

## 2022-04-10 DIAGNOSIS — R4182 Altered mental status, unspecified: Secondary | ICD-10-CM | POA: Diagnosis not present

## 2022-04-10 DIAGNOSIS — G9341 Metabolic encephalopathy: Secondary | ICD-10-CM | POA: Diagnosis present

## 2022-04-10 DIAGNOSIS — Z9181 History of falling: Secondary | ICD-10-CM | POA: Diagnosis not present

## 2022-04-11 DIAGNOSIS — Z7982 Long term (current) use of aspirin: Secondary | ICD-10-CM | POA: Diagnosis not present

## 2022-04-11 DIAGNOSIS — G40909 Epilepsy, unspecified, not intractable, without status epilepticus: Secondary | ICD-10-CM | POA: Diagnosis present

## 2022-04-11 DIAGNOSIS — J0100 Acute maxillary sinusitis, unspecified: Secondary | ICD-10-CM | POA: Diagnosis not present

## 2022-04-11 DIAGNOSIS — B9710 Unspecified enterovirus as the cause of diseases classified elsewhere: Secondary | ICD-10-CM | POA: Diagnosis present

## 2022-04-11 DIAGNOSIS — R509 Fever, unspecified: Secondary | ICD-10-CM | POA: Diagnosis not present

## 2022-04-11 DIAGNOSIS — G248 Other dystonia: Secondary | ICD-10-CM | POA: Diagnosis not present

## 2022-04-11 DIAGNOSIS — F419 Anxiety disorder, unspecified: Secondary | ICD-10-CM | POA: Diagnosis not present

## 2022-04-11 DIAGNOSIS — E559 Vitamin D deficiency, unspecified: Secondary | ICD-10-CM | POA: Diagnosis not present

## 2022-04-11 DIAGNOSIS — A419 Sepsis, unspecified organism: Secondary | ICD-10-CM | POA: Diagnosis not present

## 2022-04-11 DIAGNOSIS — Z87891 Personal history of nicotine dependence: Secondary | ICD-10-CM | POA: Diagnosis not present

## 2022-04-11 DIAGNOSIS — R296 Repeated falls: Secondary | ICD-10-CM | POA: Diagnosis present

## 2022-04-11 DIAGNOSIS — M6281 Muscle weakness (generalized): Secondary | ICD-10-CM | POA: Diagnosis not present

## 2022-04-11 DIAGNOSIS — G9341 Metabolic encephalopathy: Secondary | ICD-10-CM | POA: Diagnosis present

## 2022-04-11 DIAGNOSIS — Z20822 Contact with and (suspected) exposure to covid-19: Secondary | ICD-10-CM | POA: Diagnosis present

## 2022-04-11 DIAGNOSIS — D649 Anemia, unspecified: Secondary | ICD-10-CM | POA: Diagnosis present

## 2022-04-11 DIAGNOSIS — J309 Allergic rhinitis, unspecified: Secondary | ICD-10-CM | POA: Diagnosis not present

## 2022-04-11 DIAGNOSIS — R4182 Altered mental status, unspecified: Secondary | ICD-10-CM | POA: Diagnosis not present

## 2022-04-11 DIAGNOSIS — J019 Acute sinusitis, unspecified: Secondary | ICD-10-CM | POA: Diagnosis present

## 2022-04-11 DIAGNOSIS — I10 Essential (primary) hypertension: Secondary | ICD-10-CM | POA: Diagnosis present

## 2022-04-11 DIAGNOSIS — R918 Other nonspecific abnormal finding of lung field: Secondary | ICD-10-CM | POA: Diagnosis not present

## 2022-04-11 DIAGNOSIS — Z91018 Allergy to other foods: Secondary | ICD-10-CM | POA: Diagnosis not present

## 2022-04-11 DIAGNOSIS — R262 Difficulty in walking, not elsewhere classified: Secondary | ICD-10-CM | POA: Diagnosis not present

## 2022-04-11 DIAGNOSIS — R059 Cough, unspecified: Secondary | ICD-10-CM | POA: Diagnosis not present

## 2022-04-11 DIAGNOSIS — Z79899 Other long term (current) drug therapy: Secondary | ICD-10-CM | POA: Diagnosis not present

## 2022-04-11 DIAGNOSIS — F32A Depression, unspecified: Secondary | ICD-10-CM | POA: Diagnosis present

## 2022-04-11 DIAGNOSIS — B9689 Other specified bacterial agents as the cause of diseases classified elsewhere: Secondary | ICD-10-CM | POA: Diagnosis present

## 2022-04-11 DIAGNOSIS — B9789 Other viral agents as the cause of diseases classified elsewhere: Secondary | ICD-10-CM | POA: Diagnosis not present

## 2022-04-11 DIAGNOSIS — R41841 Cognitive communication deficit: Secondary | ICD-10-CM | POA: Diagnosis not present

## 2022-04-11 DIAGNOSIS — J329 Chronic sinusitis, unspecified: Secondary | ICD-10-CM | POA: Diagnosis not present

## 2022-04-11 DIAGNOSIS — B348 Other viral infections of unspecified site: Secondary | ICD-10-CM | POA: Diagnosis not present

## 2022-04-11 DIAGNOSIS — R2689 Other abnormalities of gait and mobility: Secondary | ICD-10-CM | POA: Diagnosis present

## 2022-04-11 DIAGNOSIS — E871 Hypo-osmolality and hyponatremia: Secondary | ICD-10-CM | POA: Diagnosis present

## 2022-04-11 DIAGNOSIS — G249 Dystonia, unspecified: Secondary | ICD-10-CM | POA: Diagnosis present

## 2022-04-11 DIAGNOSIS — Z888 Allergy status to other drugs, medicaments and biological substances status: Secondary | ICD-10-CM | POA: Diagnosis not present

## 2022-04-11 DIAGNOSIS — R278 Other lack of coordination: Secondary | ICD-10-CM | POA: Diagnosis not present

## 2022-04-11 DIAGNOSIS — B341 Enterovirus infection, unspecified: Secondary | ICD-10-CM | POA: Diagnosis not present

## 2022-04-11 DIAGNOSIS — Z9181 History of falling: Secondary | ICD-10-CM | POA: Diagnosis not present

## 2022-04-11 DIAGNOSIS — R569 Unspecified convulsions: Secondary | ICD-10-CM | POA: Diagnosis not present

## 2022-04-11 DIAGNOSIS — A4189 Other specified sepsis: Secondary | ICD-10-CM | POA: Diagnosis not present

## 2022-04-16 DIAGNOSIS — D649 Anemia, unspecified: Secondary | ICD-10-CM | POA: Diagnosis not present

## 2022-04-16 DIAGNOSIS — B9789 Other viral agents as the cause of diseases classified elsewhere: Secondary | ICD-10-CM | POA: Diagnosis not present

## 2022-04-16 DIAGNOSIS — B348 Other viral infections of unspecified site: Secondary | ICD-10-CM | POA: Diagnosis not present

## 2022-04-16 DIAGNOSIS — F32A Depression, unspecified: Secondary | ICD-10-CM | POA: Diagnosis not present

## 2022-04-16 DIAGNOSIS — Z8619 Personal history of other infectious and parasitic diseases: Secondary | ICD-10-CM | POA: Diagnosis not present

## 2022-04-16 DIAGNOSIS — R262 Difficulty in walking, not elsewhere classified: Secondary | ICD-10-CM | POA: Diagnosis not present

## 2022-04-16 DIAGNOSIS — G9341 Metabolic encephalopathy: Secondary | ICD-10-CM | POA: Diagnosis not present

## 2022-04-16 DIAGNOSIS — J309 Allergic rhinitis, unspecified: Secondary | ICD-10-CM | POA: Diagnosis not present

## 2022-04-16 DIAGNOSIS — J0100 Acute maxillary sinusitis, unspecified: Secondary | ICD-10-CM | POA: Diagnosis not present

## 2022-04-16 DIAGNOSIS — J329 Chronic sinusitis, unspecified: Secondary | ICD-10-CM | POA: Diagnosis not present

## 2022-04-16 DIAGNOSIS — F419 Anxiety disorder, unspecified: Secondary | ICD-10-CM | POA: Diagnosis not present

## 2022-04-16 DIAGNOSIS — G249 Dystonia, unspecified: Secondary | ICD-10-CM | POA: Diagnosis not present

## 2022-04-16 DIAGNOSIS — R569 Unspecified convulsions: Secondary | ICD-10-CM | POA: Diagnosis not present

## 2022-04-16 DIAGNOSIS — Z9181 History of falling: Secondary | ICD-10-CM | POA: Diagnosis not present

## 2022-04-16 DIAGNOSIS — Z7189 Other specified counseling: Secondary | ICD-10-CM | POA: Diagnosis not present

## 2022-04-16 DIAGNOSIS — M6281 Muscle weakness (generalized): Secondary | ICD-10-CM | POA: Diagnosis not present

## 2022-04-16 DIAGNOSIS — R41841 Cognitive communication deficit: Secondary | ICD-10-CM | POA: Diagnosis not present

## 2022-04-16 DIAGNOSIS — G118 Other hereditary ataxias: Secondary | ICD-10-CM | POA: Diagnosis not present

## 2022-04-16 DIAGNOSIS — B9689 Other specified bacterial agents as the cause of diseases classified elsewhere: Secondary | ICD-10-CM | POA: Diagnosis not present

## 2022-04-16 DIAGNOSIS — A419 Sepsis, unspecified organism: Secondary | ICD-10-CM | POA: Diagnosis not present

## 2022-04-16 DIAGNOSIS — I10 Essential (primary) hypertension: Secondary | ICD-10-CM | POA: Diagnosis not present

## 2022-04-16 DIAGNOSIS — B341 Enterovirus infection, unspecified: Secondary | ICD-10-CM | POA: Diagnosis not present

## 2022-04-16 DIAGNOSIS — F325 Major depressive disorder, single episode, in full remission: Secondary | ICD-10-CM | POA: Diagnosis not present

## 2022-04-16 DIAGNOSIS — R4182 Altered mental status, unspecified: Secondary | ICD-10-CM | POA: Diagnosis not present

## 2022-04-16 DIAGNOSIS — R278 Other lack of coordination: Secondary | ICD-10-CM | POA: Diagnosis not present

## 2022-04-16 DIAGNOSIS — G248 Other dystonia: Secondary | ICD-10-CM | POA: Diagnosis not present

## 2022-04-16 DIAGNOSIS — G40909 Epilepsy, unspecified, not intractable, without status epilepticus: Secondary | ICD-10-CM | POA: Diagnosis not present

## 2022-04-16 DIAGNOSIS — E559 Vitamin D deficiency, unspecified: Secondary | ICD-10-CM | POA: Diagnosis not present

## 2022-04-16 DIAGNOSIS — G959 Disease of spinal cord, unspecified: Secondary | ICD-10-CM | POA: Diagnosis not present

## 2022-04-16 DIAGNOSIS — S90512A Abrasion, left ankle, initial encounter: Secondary | ICD-10-CM | POA: Diagnosis not present

## 2022-04-16 DIAGNOSIS — E871 Hypo-osmolality and hyponatremia: Secondary | ICD-10-CM | POA: Diagnosis not present

## 2022-04-16 DIAGNOSIS — Z7982 Long term (current) use of aspirin: Secondary | ICD-10-CM | POA: Diagnosis not present

## 2022-04-18 DIAGNOSIS — R4182 Altered mental status, unspecified: Secondary | ICD-10-CM | POA: Diagnosis not present

## 2022-04-18 DIAGNOSIS — G249 Dystonia, unspecified: Secondary | ICD-10-CM | POA: Diagnosis not present

## 2022-04-18 DIAGNOSIS — Z7189 Other specified counseling: Secondary | ICD-10-CM | POA: Diagnosis not present

## 2022-04-18 DIAGNOSIS — I10 Essential (primary) hypertension: Secondary | ICD-10-CM | POA: Diagnosis not present

## 2022-04-18 DIAGNOSIS — G118 Other hereditary ataxias: Secondary | ICD-10-CM | POA: Diagnosis not present

## 2022-04-18 DIAGNOSIS — G959 Disease of spinal cord, unspecified: Secondary | ICD-10-CM | POA: Diagnosis not present

## 2022-04-18 DIAGNOSIS — Z8619 Personal history of other infectious and parasitic diseases: Secondary | ICD-10-CM | POA: Diagnosis not present

## 2022-04-18 DIAGNOSIS — G40909 Epilepsy, unspecified, not intractable, without status epilepticus: Secondary | ICD-10-CM | POA: Diagnosis not present

## 2022-04-18 DIAGNOSIS — F325 Major depressive disorder, single episode, in full remission: Secondary | ICD-10-CM | POA: Diagnosis not present

## 2022-04-21 DIAGNOSIS — G9341 Metabolic encephalopathy: Secondary | ICD-10-CM | POA: Diagnosis not present

## 2022-04-21 DIAGNOSIS — G40909 Epilepsy, unspecified, not intractable, without status epilepticus: Secondary | ICD-10-CM | POA: Diagnosis not present

## 2022-04-21 DIAGNOSIS — I10 Essential (primary) hypertension: Secondary | ICD-10-CM | POA: Diagnosis not present

## 2022-04-23 ENCOUNTER — Ambulatory Visit: Payer: Medicare Other | Admitting: Neurology

## 2022-05-07 DIAGNOSIS — G40909 Epilepsy, unspecified, not intractable, without status epilepticus: Secondary | ICD-10-CM | POA: Diagnosis not present

## 2022-05-07 DIAGNOSIS — G9341 Metabolic encephalopathy: Secondary | ICD-10-CM | POA: Diagnosis not present

## 2022-05-07 DIAGNOSIS — F419 Anxiety disorder, unspecified: Secondary | ICD-10-CM | POA: Diagnosis not present

## 2022-05-07 DIAGNOSIS — I10 Essential (primary) hypertension: Secondary | ICD-10-CM | POA: Diagnosis not present

## 2022-05-12 DIAGNOSIS — G9341 Metabolic encephalopathy: Secondary | ICD-10-CM | POA: Diagnosis not present

## 2022-05-12 DIAGNOSIS — I10 Essential (primary) hypertension: Secondary | ICD-10-CM | POA: Diagnosis not present

## 2022-05-12 DIAGNOSIS — G118 Other hereditary ataxias: Secondary | ICD-10-CM | POA: Diagnosis not present

## 2022-05-12 DIAGNOSIS — G40909 Epilepsy, unspecified, not intractable, without status epilepticus: Secondary | ICD-10-CM | POA: Diagnosis not present

## 2022-05-13 DIAGNOSIS — S90512A Abrasion, left ankle, initial encounter: Secondary | ICD-10-CM | POA: Diagnosis not present

## 2022-06-03 ENCOUNTER — Other Ambulatory Visit: Payer: Self-pay | Admitting: Neurology

## 2022-06-03 MED ORDER — DULOXETINE HCL 60 MG PO CPEP: ORAL_CAPSULE | ORAL | 0 refills | Status: AC

## 2022-06-03 MED ORDER — CLONAZEPAM 0.5 MG PO TABS: 0.5000 mg | ORAL_TABLET | Freq: Every evening | ORAL | 1 refills | Status: DC | PRN

## 2022-06-03 NOTE — Telephone Encounter (Signed)
Verify Drug Registry For clonazepam (KLONOPIN) 0.5 MG tablet Last Filled: 04/03/2022 Quantity: 35 tablets for 35 days Last appointment: 01/15/2022 Next appointment: 07/23/2022   **Lorazepam 0.5 Mg Tablet was filled on 05/26/2022 by Baldwin Jamaica, MD for a QTY of 4 tablets for 8 days.

## 2022-06-03 NOTE — Telephone Encounter (Signed)
Pt is calling and requesting a refill on DULoxetine (CYMBALTA) 60 MG capsule and  clonazePAM (KLONOPIN) 0.5 MG tablet. Pt is requesting prescription be sent to [id number]

## 2022-06-20 DIAGNOSIS — Z7189 Other specified counseling: Secondary | ICD-10-CM | POA: Diagnosis not present

## 2022-07-23 ENCOUNTER — Ambulatory Visit: Payer: Medicare Other | Admitting: Family Medicine

## 2022-07-24 NOTE — Progress Notes (Deleted)
No chief complaint on file.   HISTORY OF PRESENT ILLNESS:  07/24/22 ALL:  Patrick Daniel is a 66 y.o. male here today for follow up for cervical dystonia, insomnia and pain. He has been followed regularly by Dr Krista Blue for Botox, now on Xeomin therapy. He continues divalproex ER '500mg'$  daily for mood stabilization and seizure prevention. He also continues clonazepam 0.'5mg'$  for insomnia and tramadol for pain.   He lives at home with his daughter. He is able to perform ADLs independently and gait is stable.   He was admitted to [facility] 04/10/2022 with diagnosis of sepsis with encephalopathy secondary to rhino enterovirus and bacterial sinusitis. His daughter noted he was confused and had rolled out of bed three times. He was treated with Augmentin and returned home.   HISTORY (copied from previous note)  He has past medical history of hyperlipidemia, depression, He presented with gradual onset language difficulty, gait difficulty since 2009. He was evaluated by [facility] neurologist at [facility], reported normal MRI of brain, he was diagnosed with cervical dystonia, was given Botox injection of the cervical muscles, which has improved his symptoms temporarily.   He moved back to the [name] in April 2012, began to seek medical attention, initially evaluated by [name], reported again normal MRI of brain, also had trial of Haldol and baclofen without much improvement.   Sinemet did not help him. Artane and baclofen provide no help either. 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 He was evaluated by local neurosurgeon [name] at [facility], Because concomitant central nervous system degenerative disorder, cervical dystonia, he is hesitate to perform  decompression surgery. I later referred him to [facility] neurology, evaluated by [name], who has suggested Botox injection to alleviate his cervical dystonia symptoms first, may consider cervical decompression surgery later. Also, likely there is a component of central nervous system degenerative disorder, such as spinocerebellar ataxia,.    He has significant dysarthria, also dysphagia, gait difficulty, frequent falling, he denies sensory changes. Normal or negative CMP, PEP, CBC, HIV, Lyme disease, SSA/B, TSH, ACE, RPR, CRP, CPK.   He received first EMG guided Botox injection since March 2012, he reported  great improvement, he has been receiving BOTOX injection every 3 months, there is significant improvement of his abnormal neck postural, but still with severe retrocollis, left turn, right tilt.  he began to have moderate improvement after repeat injection. He was evaluated by his neurosurgeon [name] at [facility], repeat MRI cervical showed advanced multilevel degenerative changes of the cervical spine, most pronounced at C5-C6, where there is severe central stenosis, impression on the cord with focal myelomalacia and severe bilateral neuroforaminal narrowing. Overall appearance is similar to prior study from 09/18/2010. He had posterior decompression  and fusion surgery in December 2013, recovering well, but complains of neck muscle spasm, and chest muscle spasm continued spastic gait difficulty, occasional urinary incontinence, his posture is much improved, but has limited range of motion of his neck   He had EMG guided BOTOX injection by me since March 2012, most recent EMG guided Botox injection was by [name] April 10 2015,300 units of Botox A   He continued to have unsteady gait, difficulty finding a comfortable position, urinary urgency, he denies constipation, denies upper extremity paresthesia or weakness, slow  worsening dysarthria,   MRI of the brain in 2016 showed generalized  atrophy, most prominent in vermis,  genetic or degenerative processes that may present with both ataxia and dystonia such as the spinal cerebellar ataxias and OPCA.   Additional note is made of a few small T2/flair hyperintense foci consistent with minimal small vessel ischemic changes. MRI of cervical spine: Prior C4-C6 ACDF and C2-T2 posterior fusion. The vertebral bodies are normally aligned.   Metal artifact from these fusions obscures many of the images. Multilevel cervical degenerative disc disease, most significant findings are at C5-C6. There appears to be a posterior bony ridge that mildly narrows the neural canal but does not appear to be causing spinal cord compression. However, there is possible foraminal narrowing to the right that could lead to some right C6 nerve root impingement.     MRI lumbar in December 2017 no significant abnormality   he reported long history of heavy alcohol abuse for more than 20 years,   He has continued EMG guided botulinum toxin injection for his cervical dystonia over the past few years, last injection was in February 2020,   He was admitted to the hospital on February 11, 2019 after he fell in his apartment lying on the floor for unknown period of time, upon admission, elevated CPK 1988.  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, he was seen by ID consult, acyclovir treatment for 21 days, he also suffered acute kidney failure, that recovered to normal with creatinine of 0.7 upon discharge, he also had a urinary retention, still using Foley, Flomax, was seen by urologist, doubt he is a candidate for suprapubic catheter, he has worsening gait abnormality, can no longer ambulate without assistant, no bowel incontinence, he also suffered left humeral neck fracture, still has limited range of motion of left arm   He was  found to be anemia, hemoglobin dropped to 6, required blood transfusion, suspicious for GI bleeding, further work-up is pending, upon discharge, hemoglobin was 10 on Mar 02, 2019,   He is now home with his daughter staying wheelchair, complains of left shoulder pain   UPDATE Aug 08 2019: He continues to make recovery, is able to ambulate with walker, continue have Foley catheter, has significant neck pain, difficulty sleeping   Update October 26, 2019: He responded well to previous injection   UPDATE April 7th 2021: He lives with his daughter, overall doing well, slow worsening gait abnormality, no longer have significant neck pain, but complains of neck muscle stiffness.   UPDATE May 09 2020: He continue complains of worsening gait abnormality, spasticity, limited range of motion of neck, neck stiffness, but he no longer have significant abnormal neck pulling, abnormal neck posturing   UPDATE Aug 29 2020: He did well with previous injection, continue complains of right sternocleidomastoid tension, tendency to turning towards the left side, sometimes bilateral posterior cervical muscle spasm tension   UPDATE Dec 05 2020: He has mildly regression, increased weakness following his COVID infection few weeks ago.  He complains of muscle tension but no significant pain, bilateral lower extremity spasticity   Update Mar 13, 2021: He is brought in by his daughter at today's clinical visit, continuing intermittent neck pain, clonazepam is helpful, occasional needed extra tablet   Update September 18, 2021: He continues to benefit low-dose Botox injection, relaxes neck pain, muscle tightness some,   Update  March 2023: Patient has slow worsening of his dysarthria, slurred speech, denies significant dysphagia, worsening gait abnormality, he fell, broke his right hip in December 2022, require surgery, this was done at [facility], he is recovering well overall, denies significant pain  Patient  only ambulated with walker, denies bowel and bladder incontinence, denies sensory changes  Overall the years, he is on lower dose of xeomin injection only 100 units, he has a lot of neck muscle tightness, but denies significant pain   REVIEW OF SYSTEMS: Out of a complete 14 system review of symptoms, the patient complains only of the following symptoms, and all other reviewed systems are negative.   ALLERGIES: Allergies  Allergen Reactions   Baclofen Other (See Comments)    Weakness, drowsiness     HOME MEDICATIONS: Outpatient Medications Prior to Visit  Medication Sig Dispense Refill   clonazePAM (KLONOPIN) 0.5 MG tablet Take 1 tablet (0.5 mg total) by mouth at bedtime as needed for anxiety. 35 tablet 1   divalproex (DEPAKOTE ER) 500 MG 24 hr tablet TAKE 1 TABLET(500 MG) BY MOUTH AT BEDTIME 90 tablet 1   DULoxetine (CYMBALTA) 60 MG capsule TAKE 1 CAPSULE(60 MG) BY MOUTH DAILY 90 capsule 0   IncobotulinumtoxinA (XEOMIN IM) Inject 100 Units into the muscle every 3 (three) months.      loratadine (CLARITIN) 10 MG tablet Take 5 mg by mouth daily.     mometasone (ELOCON) 0.1 % cream Apply 1 application topically as needed.     rosuvastatin (CRESTOR) 10 MG tablet Take 10 mg by mouth once a week.     traMADol (ULTRAM) 50 MG tablet Take 1 tablet (50 mg total) by mouth every 12 (twelve) hours as needed. 60 tablet 1   Facility-Administered Medications Prior to Visit  Medication Dose Route Frequency Provider Last Rate Last Admin   incobotulinumtoxinA (XEOMIN) 100 units injection 100 Units  100 Units Intramuscular Q90 days Marcial Pacas, MD   100 Units at 06/12/21 1640   incobotulinumtoxinA (XEOMIN) 100 units injection 100 Units  100 Units Intramuscular Q90 days Marcial Pacas, MD   100 Units at 09/18/21 1641   incobotulinumtoxinA (XEOMIN) 100 units injection 200 Units  200 Units Intramuscular Q90 days Marcial Pacas, MD   200 Units at 01/15/22 1825     PAST MEDICAL HISTORY: Past Medical History:   Diagnosis Date   Dysarthria    Dystonia    Gait disturbance    Myelopathy (HCC)    Paraplegia ([place])      PAST SURGICAL HISTORY: Past Surgical History:  Procedure Laterality Date   ANTERIOR CERVICAL DECOMP/DISCECTOMY FUSION N/A 10/04/2013   s/p C4-C5, C5-C6 anterior cervical diskectomy and fusion   POSTERIOR CERVICAL FUSION/FORAMINOTOMY N/A 10/04/2013    posterior cervical fusion from C2-T2 with rods and screws and local bone graft and also decompressive laminectomy from the bottom of C2 to the top of T1 with decompression of spinal cord and the nerve roots bilaterally performed by [name] on 10/04/13      FAMILY HISTORY: Family History  Problem Relation Age of Onset   Heart disease Mother    Cancer Mother    Heart Problems Father    High Cholesterol Sister    High blood pressure Sister    Diabetes Sister    High blood pressure Sister      SOCIAL HISTORY: Social History   Socioeconomic History   Marital status: Single    Spouse name: Not on file   Number of children: 1  Years of education: Not on file   Highest education level: Not on file  Occupational History   Occupation: retired    Comment: Therapist, art  Tobacco Use   Smoking status: Former    Types: Cigarettes    Quit date: 02/01/2013    Years since quitting: 9.4   Smokeless tobacco: Never   Tobacco comments:    quit   Substance and Sexual Activity   Alcohol use: No   Drug use: No   Sexual activity: Not on file  Other Topics Concern   Not on file  Social History Narrative   Patient lives in independent living [place]. Patient is retired from [employer]. Patient has one child. Right handed. Patient is divorced.   Caffeine- 12 - Coffee daily - one daily -soda   Social Determinants of Health   Financial Resource Strain: Not on file  Food Insecurity: Not on file  Transportation Needs: Not on file  Physical Activity: Not on file  Stress: Not on file  Social Connections: Not on file  Intimate Partner  Violence: Not on file     PHYSICAL EXAM  There were no vitals filed for this visit. There is no height or weight on file to calculate BMI.  Generalized: Well developed, in no acute distress  Cardiology: normal rate and rhythm, no murmur auscultated  Respiratory: clear to auscultation bilaterally    Neurological examination  Mentation: Alert oriented to time, place, history taking. Follows all commands speech and language fluent Cranial nerve II-XII: Pupils were equal round reactive to light. Extraocular movements were full, visual field were full on confrontational test. Facial sensation and strength were normal. Uvula tongue midline. Head turning and shoulder shrug  were normal and symmetric. Motor: The motor testing reveals 5 over 5 strength of all 4 extremities. Good symmetric motor tone is noted throughout.  Sensory: Sensory testing is intact to soft touch on all 4 extremities. No evidence of extinction is noted.  Coordination: Cerebellar testing reveals good finger-nose-finger and heel-to-shin bilaterally.  Gait and station: Gait is normal. Tandem gait is normal. Romberg is negative. No drift is seen.  Reflexes: Deep tendon reflexes are symmetric and normal bilaterally.    DIAGNOSTIC DATA (LABS, IMAGING, TESTING) - I reviewed patient records, labs, notes, testing and imaging myself where available.  Lab Results  Component Value Date   WBC 7.0 03/02/2019   HGB 10.0 (L) 03/02/2019   HCT 31.0 (L) 03/02/2019   MCV 98.1 03/02/2019   PLT 488 (H) 03/02/2019      Component Value Date/Time   NA 140 03/04/2019 0352   K 3.2 (L) 03/04/2019 0352   CL 106 03/04/2019 0352   CO2 25 03/04/2019 0352   GLUCOSE 115 (H) 03/04/2019 0352   BUN 21 (H) 03/04/2019 0352   CREATININE 0.70 03/04/2019 0352   CALCIUM 8.6 (L) 03/04/2019 0352   PROT 6.0 (L) 02/11/2019 0342   ALBUMIN 3.1 (L) 02/11/2019 0342   AST 51 (H) 02/11/2019 0342   ALT 39 02/11/2019 0342   ALKPHOS 55 02/11/2019 0342    BILITOT 1.3 (H) 02/11/2019 0342   GFRNONAA >60 03/04/2019 0352   GFRAA >60 03/04/2019 0352   No results found for: "CHOL", "HDL", "LDLCALC", "LDLDIRECT", "TRIG", "CHOLHDL" No results found for: "HGBA1C" Lab Results  Component Value Date   VITAMINB12 1,196 (H) 02/11/2019   Lab Results  Component Value Date   TSH 0.484 03/04/2019        No data to display  No data to display           ASSESSMENT AND PLAN  66 y.o. year old male  has a past medical history of Dysarthria, Dystonia, Gait disturbance, Myelopathy ([place]), and Paraplegia ([place]). here with    No diagnosis found.  Hildred Priest ***.  Healthy lifestyle habits encouraged. *** will follow up with PCP as directed. *** will return to see me in ***, sooner if needed. *** verbalizes understanding and agreement with this plan.   No orders of the defined types were placed in this encounter.    No orders of the defined types were placed in this encounter.    Debbora Presto, MSN, FNP-C 07/24/2022, 12:52 PM  [facility] [address], [place], [place] [address] [phone]

## 2022-07-28 ENCOUNTER — Ambulatory Visit: Payer: Medicare Other | Admitting: Family Medicine

## 2022-07-28 DIAGNOSIS — G243 Spasmodic torticollis: Secondary | ICD-10-CM

## 2022-07-28 DIAGNOSIS — G319 Degenerative disease of nervous system, unspecified: Secondary | ICD-10-CM

## 2022-07-28 DIAGNOSIS — R471 Dysarthria and anarthria: Secondary | ICD-10-CM

## 2022-08-01 DIAGNOSIS — H9193 Unspecified hearing loss, bilateral: Secondary | ICD-10-CM | POA: Diagnosis not present

## 2022-08-01 DIAGNOSIS — Z9181 History of falling: Secondary | ICD-10-CM | POA: Diagnosis not present

## 2022-08-01 DIAGNOSIS — G959 Disease of spinal cord, unspecified: Secondary | ICD-10-CM | POA: Diagnosis not present

## 2022-08-01 DIAGNOSIS — N3941 Urge incontinence: Secondary | ICD-10-CM | POA: Diagnosis not present

## 2022-08-01 DIAGNOSIS — M810 Age-related osteoporosis without current pathological fracture: Secondary | ICD-10-CM | POA: Diagnosis not present

## 2022-08-01 DIAGNOSIS — E7800 Pure hypercholesterolemia, unspecified: Secondary | ICD-10-CM | POA: Diagnosis not present

## 2022-08-01 DIAGNOSIS — Z8781 Personal history of (healed) traumatic fracture: Secondary | ICD-10-CM | POA: Diagnosis not present

## 2022-08-01 DIAGNOSIS — Z87891 Personal history of nicotine dependence: Secondary | ICD-10-CM | POA: Diagnosis not present

## 2022-08-01 DIAGNOSIS — R1310 Dysphagia, unspecified: Secondary | ICD-10-CM | POA: Diagnosis not present

## 2022-08-01 DIAGNOSIS — Z96649 Presence of unspecified artificial hip joint: Secondary | ICD-10-CM | POA: Diagnosis not present

## 2022-08-01 DIAGNOSIS — F32A Depression, unspecified: Secondary | ICD-10-CM | POA: Diagnosis not present

## 2022-08-01 DIAGNOSIS — G1119 Other early-onset cerebellar ataxia: Secondary | ICD-10-CM | POA: Diagnosis not present

## 2022-08-01 DIAGNOSIS — G40909 Epilepsy, unspecified, not intractable, without status epilepticus: Secondary | ICD-10-CM | POA: Diagnosis not present

## 2022-08-05 DIAGNOSIS — G40909 Epilepsy, unspecified, not intractable, without status epilepticus: Secondary | ICD-10-CM | POA: Diagnosis not present

## 2022-08-05 DIAGNOSIS — H9193 Unspecified hearing loss, bilateral: Secondary | ICD-10-CM | POA: Diagnosis not present

## 2022-08-05 DIAGNOSIS — F32A Depression, unspecified: Secondary | ICD-10-CM | POA: Diagnosis not present

## 2022-08-05 DIAGNOSIS — G1119 Other early-onset cerebellar ataxia: Secondary | ICD-10-CM | POA: Diagnosis not present

## 2022-08-05 DIAGNOSIS — G959 Disease of spinal cord, unspecified: Secondary | ICD-10-CM | POA: Diagnosis not present

## 2022-08-05 DIAGNOSIS — M810 Age-related osteoporosis without current pathological fracture: Secondary | ICD-10-CM | POA: Diagnosis not present

## 2022-08-12 DIAGNOSIS — G1119 Other early-onset cerebellar ataxia: Secondary | ICD-10-CM | POA: Diagnosis not present

## 2022-08-12 DIAGNOSIS — H9193 Unspecified hearing loss, bilateral: Secondary | ICD-10-CM | POA: Diagnosis not present

## 2022-08-12 DIAGNOSIS — M810 Age-related osteoporosis without current pathological fracture: Secondary | ICD-10-CM | POA: Diagnosis not present

## 2022-08-12 DIAGNOSIS — G40909 Epilepsy, unspecified, not intractable, without status epilepticus: Secondary | ICD-10-CM | POA: Diagnosis not present

## 2022-08-12 DIAGNOSIS — G959 Disease of spinal cord, unspecified: Secondary | ICD-10-CM | POA: Diagnosis not present

## 2022-08-12 DIAGNOSIS — F32A Depression, unspecified: Secondary | ICD-10-CM | POA: Diagnosis not present

## 2022-08-19 DIAGNOSIS — M810 Age-related osteoporosis without current pathological fracture: Secondary | ICD-10-CM | POA: Diagnosis not present

## 2022-08-19 DIAGNOSIS — F32A Depression, unspecified: Secondary | ICD-10-CM | POA: Diagnosis not present

## 2022-08-19 DIAGNOSIS — G959 Disease of spinal cord, unspecified: Secondary | ICD-10-CM | POA: Diagnosis not present

## 2022-08-19 DIAGNOSIS — G40909 Epilepsy, unspecified, not intractable, without status epilepticus: Secondary | ICD-10-CM | POA: Diagnosis not present

## 2022-08-19 DIAGNOSIS — G1119 Other early-onset cerebellar ataxia: Secondary | ICD-10-CM | POA: Diagnosis not present

## 2022-08-19 DIAGNOSIS — H9193 Unspecified hearing loss, bilateral: Secondary | ICD-10-CM | POA: Diagnosis not present

## 2022-08-28 DIAGNOSIS — M810 Age-related osteoporosis without current pathological fracture: Secondary | ICD-10-CM | POA: Diagnosis not present

## 2022-08-28 DIAGNOSIS — G1119 Other early-onset cerebellar ataxia: Secondary | ICD-10-CM | POA: Diagnosis not present

## 2022-08-28 DIAGNOSIS — G959 Disease of spinal cord, unspecified: Secondary | ICD-10-CM | POA: Diagnosis not present

## 2022-08-28 DIAGNOSIS — G40909 Epilepsy, unspecified, not intractable, without status epilepticus: Secondary | ICD-10-CM | POA: Diagnosis not present

## 2022-08-28 DIAGNOSIS — F32A Depression, unspecified: Secondary | ICD-10-CM | POA: Diagnosis not present

## 2022-08-28 DIAGNOSIS — H9193 Unspecified hearing loss, bilateral: Secondary | ICD-10-CM | POA: Diagnosis not present

## 2022-08-31 DIAGNOSIS — E7800 Pure hypercholesterolemia, unspecified: Secondary | ICD-10-CM | POA: Diagnosis not present

## 2022-08-31 DIAGNOSIS — Z87891 Personal history of nicotine dependence: Secondary | ICD-10-CM | POA: Diagnosis not present

## 2022-08-31 DIAGNOSIS — Z9181 History of falling: Secondary | ICD-10-CM | POA: Diagnosis not present

## 2022-08-31 DIAGNOSIS — Z8781 Personal history of (healed) traumatic fracture: Secondary | ICD-10-CM | POA: Diagnosis not present

## 2022-08-31 DIAGNOSIS — G959 Disease of spinal cord, unspecified: Secondary | ICD-10-CM | POA: Diagnosis not present

## 2022-08-31 DIAGNOSIS — M810 Age-related osteoporosis without current pathological fracture: Secondary | ICD-10-CM | POA: Diagnosis not present

## 2022-08-31 DIAGNOSIS — G40909 Epilepsy, unspecified, not intractable, without status epilepticus: Secondary | ICD-10-CM | POA: Diagnosis not present

## 2022-08-31 DIAGNOSIS — R1310 Dysphagia, unspecified: Secondary | ICD-10-CM | POA: Diagnosis not present

## 2022-08-31 DIAGNOSIS — Z96649 Presence of unspecified artificial hip joint: Secondary | ICD-10-CM | POA: Diagnosis not present

## 2022-08-31 DIAGNOSIS — G1119 Other early-onset cerebellar ataxia: Secondary | ICD-10-CM | POA: Diagnosis not present

## 2022-08-31 DIAGNOSIS — H9193 Unspecified hearing loss, bilateral: Secondary | ICD-10-CM | POA: Diagnosis not present

## 2022-08-31 DIAGNOSIS — N3941 Urge incontinence: Secondary | ICD-10-CM | POA: Diagnosis not present

## 2022-08-31 DIAGNOSIS — F32A Depression, unspecified: Secondary | ICD-10-CM | POA: Diagnosis not present

## 2022-09-02 DIAGNOSIS — M810 Age-related osteoporosis without current pathological fracture: Secondary | ICD-10-CM | POA: Diagnosis not present

## 2022-09-02 DIAGNOSIS — G959 Disease of spinal cord, unspecified: Secondary | ICD-10-CM | POA: Diagnosis not present

## 2022-09-02 DIAGNOSIS — G1119 Other early-onset cerebellar ataxia: Secondary | ICD-10-CM | POA: Diagnosis not present

## 2022-09-02 DIAGNOSIS — H9193 Unspecified hearing loss, bilateral: Secondary | ICD-10-CM | POA: Diagnosis not present

## 2022-09-02 DIAGNOSIS — F32A Depression, unspecified: Secondary | ICD-10-CM | POA: Diagnosis not present

## 2022-09-02 DIAGNOSIS — G40909 Epilepsy, unspecified, not intractable, without status epilepticus: Secondary | ICD-10-CM | POA: Diagnosis not present

## 2022-09-11 DIAGNOSIS — F32A Depression, unspecified: Secondary | ICD-10-CM | POA: Diagnosis not present

## 2022-09-11 DIAGNOSIS — G1119 Other early-onset cerebellar ataxia: Secondary | ICD-10-CM | POA: Diagnosis not present

## 2022-09-11 DIAGNOSIS — H9193 Unspecified hearing loss, bilateral: Secondary | ICD-10-CM | POA: Diagnosis not present

## 2022-09-11 DIAGNOSIS — G959 Disease of spinal cord, unspecified: Secondary | ICD-10-CM | POA: Diagnosis not present

## 2022-09-11 DIAGNOSIS — M810 Age-related osteoporosis without current pathological fracture: Secondary | ICD-10-CM | POA: Diagnosis not present

## 2022-09-11 DIAGNOSIS — G40909 Epilepsy, unspecified, not intractable, without status epilepticus: Secondary | ICD-10-CM | POA: Diagnosis not present

## 2022-09-15 ENCOUNTER — Other Ambulatory Visit: Payer: Self-pay | Admitting: Neurology

## 2022-09-16 DIAGNOSIS — M810 Age-related osteoporosis without current pathological fracture: Secondary | ICD-10-CM | POA: Diagnosis not present

## 2022-09-16 DIAGNOSIS — G1119 Other early-onset cerebellar ataxia: Secondary | ICD-10-CM | POA: Diagnosis not present

## 2022-09-16 DIAGNOSIS — G40909 Epilepsy, unspecified, not intractable, without status epilepticus: Secondary | ICD-10-CM | POA: Diagnosis not present

## 2022-09-16 DIAGNOSIS — F32A Depression, unspecified: Secondary | ICD-10-CM | POA: Diagnosis not present

## 2022-09-16 DIAGNOSIS — H9193 Unspecified hearing loss, bilateral: Secondary | ICD-10-CM | POA: Diagnosis not present

## 2022-09-16 DIAGNOSIS — G959 Disease of spinal cord, unspecified: Secondary | ICD-10-CM | POA: Diagnosis not present

## 2022-09-25 DIAGNOSIS — G40909 Epilepsy, unspecified, not intractable, without status epilepticus: Secondary | ICD-10-CM | POA: Diagnosis not present

## 2022-09-25 DIAGNOSIS — G959 Disease of spinal cord, unspecified: Secondary | ICD-10-CM | POA: Diagnosis not present

## 2022-09-25 DIAGNOSIS — M810 Age-related osteoporosis without current pathological fracture: Secondary | ICD-10-CM | POA: Diagnosis not present

## 2022-09-25 DIAGNOSIS — G1119 Other early-onset cerebellar ataxia: Secondary | ICD-10-CM | POA: Diagnosis not present

## 2022-09-25 DIAGNOSIS — H9193 Unspecified hearing loss, bilateral: Secondary | ICD-10-CM | POA: Diagnosis not present

## 2022-09-25 DIAGNOSIS — F32A Depression, unspecified: Secondary | ICD-10-CM | POA: Diagnosis not present

## 2022-11-07 ENCOUNTER — Other Ambulatory Visit: Payer: Self-pay | Admitting: Neurology

## 2023-02-17 ENCOUNTER — Other Ambulatory Visit: Payer: Self-pay | Admitting: Neurology

## 2023-06-01 ENCOUNTER — Other Ambulatory Visit: Payer: Self-pay | Admitting: Neurology

## 2023-06-02 ENCOUNTER — Other Ambulatory Visit: Payer: Self-pay | Admitting: Neurology

## 2023-06-16 DIAGNOSIS — Z0000 Encounter for general adult medical examination without abnormal findings: Secondary | ICD-10-CM | POA: Diagnosis not present

## 2023-06-16 DIAGNOSIS — G118 Other hereditary ataxias: Secondary | ICD-10-CM | POA: Diagnosis not present

## 2023-06-16 DIAGNOSIS — Z13220 Encounter for screening for lipoid disorders: Secondary | ICD-10-CM | POA: Diagnosis not present

## 2023-06-16 DIAGNOSIS — G959 Disease of spinal cord, unspecified: Secondary | ICD-10-CM | POA: Diagnosis not present

## 2024-09-07 ENCOUNTER — Other Ambulatory Visit
